# Patient Record
Sex: Female | Born: 1935 | Race: White | Hispanic: No | Smoking: Former smoker
Health system: Southern US, Community
[De-identification: ages and names within clinical notes are randomized; demographics above are authoritative.]

## PROBLEM LIST (undated history)

## (undated) DIAGNOSIS — I10 Essential (primary) hypertension: Secondary | ICD-10-CM

## (undated) DIAGNOSIS — D3500 Benign neoplasm of unspecified adrenal gland: Secondary | ICD-10-CM

## (undated) DIAGNOSIS — D649 Anemia, unspecified: Secondary | ICD-10-CM

## (undated) DIAGNOSIS — K5790 Diverticulosis of intestine, part unspecified, without perforation or abscess without bleeding: Secondary | ICD-10-CM

## (undated) DIAGNOSIS — I7121 Aneurysm of the ascending aorta, without rupture: Secondary | ICD-10-CM

## (undated) DIAGNOSIS — I639 Cerebral infarction, unspecified: Secondary | ICD-10-CM

## (undated) DIAGNOSIS — K219 Gastro-esophageal reflux disease without esophagitis: Secondary | ICD-10-CM

## (undated) DIAGNOSIS — F410 Panic disorder [episodic paroxysmal anxiety] without agoraphobia: Secondary | ICD-10-CM

## (undated) DIAGNOSIS — I480 Paroxysmal atrial fibrillation: Secondary | ICD-10-CM

## (undated) DIAGNOSIS — F329 Major depressive disorder, single episode, unspecified: Secondary | ICD-10-CM

## (undated) DIAGNOSIS — I712 Thoracic aortic aneurysm, without rupture: Secondary | ICD-10-CM

## (undated) DIAGNOSIS — I499 Cardiac arrhythmia, unspecified: Secondary | ICD-10-CM

## (undated) DIAGNOSIS — R3129 Other microscopic hematuria: Secondary | ICD-10-CM

## (undated) DIAGNOSIS — N3941 Urge incontinence: Secondary | ICD-10-CM

## (undated) DIAGNOSIS — I2510 Atherosclerotic heart disease of native coronary artery without angina pectoris: Secondary | ICD-10-CM

## (undated) DIAGNOSIS — N281 Cyst of kidney, acquired: Secondary | ICD-10-CM

## (undated) DIAGNOSIS — M1990 Unspecified osteoarthritis, unspecified site: Secondary | ICD-10-CM

## (undated) DIAGNOSIS — D126 Benign neoplasm of colon, unspecified: Secondary | ICD-10-CM

## (undated) DIAGNOSIS — M810 Age-related osteoporosis without current pathological fracture: Secondary | ICD-10-CM

## (undated) DIAGNOSIS — G8190 Hemiplegia, unspecified affecting unspecified side: Secondary | ICD-10-CM

## (undated) DIAGNOSIS — F32A Depression, unspecified: Secondary | ICD-10-CM

## (undated) DIAGNOSIS — R1310 Dysphagia, unspecified: Secondary | ICD-10-CM

## (undated) DIAGNOSIS — N952 Postmenopausal atrophic vaginitis: Secondary | ICD-10-CM

## (undated) DIAGNOSIS — N390 Urinary tract infection, site not specified: Secondary | ICD-10-CM

## (undated) DIAGNOSIS — I351 Nonrheumatic aortic (valve) insufficiency: Secondary | ICD-10-CM

## (undated) DIAGNOSIS — D510 Vitamin B12 deficiency anemia due to intrinsic factor deficiency: Secondary | ICD-10-CM

## (undated) DIAGNOSIS — D128 Benign neoplasm of rectum: Secondary | ICD-10-CM

## (undated) DIAGNOSIS — J449 Chronic obstructive pulmonary disease, unspecified: Secondary | ICD-10-CM

## (undated) HISTORY — DX: Anemia, unspecified: D64.9

## (undated) HISTORY — DX: Essential (primary) hypertension: I10

## (undated) HISTORY — DX: Nonrheumatic aortic (valve) insufficiency: I35.1

## (undated) HISTORY — DX: Paroxysmal atrial fibrillation: I48.0

## (undated) HISTORY — DX: Vitamin B12 deficiency anemia due to intrinsic factor deficiency: D51.0

## (undated) HISTORY — DX: Atherosclerotic heart disease of native coronary artery without angina pectoris: I25.10

## (undated) HISTORY — DX: Chronic obstructive pulmonary disease, unspecified: J44.9

## (undated) HISTORY — PX: AORTOILIAC BYPASS: SHX6417

## (undated) HISTORY — DX: Urge incontinence: N39.41

## (undated) HISTORY — DX: Benign neoplasm of colon, unspecified: D12.6

## (undated) HISTORY — DX: Benign neoplasm of unspecified adrenal gland: D35.00

## (undated) HISTORY — DX: Diverticulosis of intestine, part unspecified, without perforation or abscess without bleeding: K57.90

## (undated) HISTORY — PX: BLEPHAROPLASTY: SUR158

## (undated) HISTORY — DX: Aneurysm of the ascending aorta, without rupture: I71.21

## (undated) HISTORY — DX: Thoracic aortic aneurysm, without rupture: I71.2

## (undated) HISTORY — DX: Other microscopic hematuria: R31.29

## (undated) HISTORY — DX: Hemiplegia, unspecified affecting unspecified side: G81.90

## (undated) HISTORY — DX: Urinary tract infection, site not specified: N39.0

## (undated) HISTORY — DX: Cyst of kidney, acquired: N28.1

## (undated) HISTORY — DX: Age-related osteoporosis without current pathological fracture: M81.0

## (undated) HISTORY — PX: GASTROSTOMY W/ FEEDING TUBE: SUR642

## (undated) HISTORY — DX: Benign neoplasm of rectum: D12.8

## (undated) HISTORY — DX: Dysphagia, unspecified: R13.10

## (undated) HISTORY — DX: Postmenopausal atrophic vaginitis: N95.2

---

## 1970-05-05 HISTORY — PX: ABDOMINAL HYSTERECTOMY: SHX81

## 2003-05-06 HISTORY — PX: CATARACT EXTRACTION: SUR2

## 2004-03-07 ENCOUNTER — Ambulatory Visit: Payer: Self-pay | Admitting: Gastroenterology

## 2004-05-15 ENCOUNTER — Ambulatory Visit: Payer: Self-pay | Admitting: Family Medicine

## 2005-07-08 ENCOUNTER — Ambulatory Visit: Payer: Self-pay | Admitting: Family Medicine

## 2006-02-24 ENCOUNTER — Ambulatory Visit: Payer: Self-pay | Admitting: Ophthalmology

## 2006-03-03 ENCOUNTER — Ambulatory Visit: Payer: Self-pay | Admitting: Ophthalmology

## 2006-08-12 ENCOUNTER — Ambulatory Visit: Payer: Self-pay | Admitting: Family Medicine

## 2007-09-21 ENCOUNTER — Ambulatory Visit: Payer: Self-pay | Admitting: Family Medicine

## 2008-09-28 ENCOUNTER — Ambulatory Visit: Payer: Self-pay | Admitting: Family Medicine

## 2009-04-18 ENCOUNTER — Ambulatory Visit: Payer: Self-pay | Admitting: Gastroenterology

## 2009-10-01 ENCOUNTER — Ambulatory Visit: Payer: Self-pay | Admitting: Family Medicine

## 2010-06-21 ENCOUNTER — Emergency Department: Payer: Self-pay | Admitting: Emergency Medicine

## 2010-06-23 ENCOUNTER — Inpatient Hospital Stay: Payer: Self-pay | Admitting: Internal Medicine

## 2010-12-17 ENCOUNTER — Ambulatory Visit: Payer: Self-pay | Admitting: Family Medicine

## 2012-04-22 LAB — HM DEXA SCAN

## 2012-05-04 ENCOUNTER — Ambulatory Visit: Payer: Self-pay | Admitting: Family Medicine

## 2013-05-10 ENCOUNTER — Ambulatory Visit: Payer: Self-pay | Admitting: Family Medicine

## 2013-09-21 ENCOUNTER — Ambulatory Visit: Payer: Self-pay | Admitting: Urology

## 2014-05-12 ENCOUNTER — Ambulatory Visit: Payer: Self-pay | Admitting: Family Medicine

## 2014-06-05 HISTORY — PX: LEFT HEART CATH: CATH118248

## 2014-07-07 DIAGNOSIS — I712 Thoracic aortic aneurysm, without rupture: Secondary | ICD-10-CM | POA: Insufficient documentation

## 2014-07-07 DIAGNOSIS — I351 Nonrheumatic aortic (valve) insufficiency: Secondary | ICD-10-CM | POA: Insufficient documentation

## 2014-07-07 DIAGNOSIS — I7121 Aneurysm of the ascending aorta, without rupture: Secondary | ICD-10-CM | POA: Insufficient documentation

## 2014-07-07 HISTORY — PX: OTHER SURGICAL HISTORY: SHX169

## 2014-07-12 DIAGNOSIS — J9690 Respiratory failure, unspecified, unspecified whether with hypoxia or hypercapnia: Secondary | ICD-10-CM | POA: Insufficient documentation

## 2014-07-23 DIAGNOSIS — I639 Cerebral infarction, unspecified: Secondary | ICD-10-CM

## 2014-07-23 HISTORY — PX: ASCENDING AORTIC ANEURYSM REPAIR: SHX1191

## 2014-07-23 HISTORY — DX: Cerebral infarction, unspecified: I63.9

## 2014-08-02 DIAGNOSIS — F17201 Nicotine dependence, unspecified, in remission: Secondary | ICD-10-CM | POA: Insufficient documentation

## 2014-08-08 HISTORY — PX: OTHER SURGICAL HISTORY: SHX169

## 2014-08-09 ENCOUNTER — Encounter
Admit: 2014-08-09 | Disposition: A | Discharge status: Home / Self Care | Payer: Self-pay | Attending: Internal Medicine | Admitting: Internal Medicine

## 2014-08-15 LAB — PROTIME-INR
INR: 1.9
Prothrombin Time: 22 secs — ABNORMAL HIGH

## 2014-08-22 LAB — PROTIME-INR
INR: 3.6
Prothrombin Time: 36.2 secs — ABNORMAL HIGH

## 2014-08-24 LAB — PROTIME-INR
INR: 2.8
Prothrombin Time: 29.7 secs — ABNORMAL HIGH

## 2014-08-29 LAB — PROTIME-INR
INR: 2.7
Prothrombin Time: 28.8 secs — ABNORMAL HIGH

## 2014-09-04 ENCOUNTER — Encounter
Admission: RE | Admit: 2014-09-04 | Discharge: 2014-09-04 | Disposition: A | Discharge status: Home / Self Care | Payer: Commercial Managed Care - HMO | Source: Ambulatory Visit | Attending: Internal Medicine | Admitting: Internal Medicine

## 2014-09-04 DIAGNOSIS — E876 Hypokalemia: Secondary | ICD-10-CM | POA: Insufficient documentation

## 2014-09-04 DIAGNOSIS — R609 Edema, unspecified: Secondary | ICD-10-CM | POA: Insufficient documentation

## 2014-09-04 DIAGNOSIS — I509 Heart failure, unspecified: Secondary | ICD-10-CM | POA: Insufficient documentation

## 2014-09-04 DIAGNOSIS — I4891 Unspecified atrial fibrillation: Secondary | ICD-10-CM | POA: Insufficient documentation

## 2014-09-05 DIAGNOSIS — R609 Edema, unspecified: Secondary | ICD-10-CM | POA: Diagnosis not present

## 2014-09-05 DIAGNOSIS — I509 Heart failure, unspecified: Secondary | ICD-10-CM | POA: Diagnosis not present

## 2014-09-05 DIAGNOSIS — I4891 Unspecified atrial fibrillation: Secondary | ICD-10-CM | POA: Diagnosis not present

## 2014-09-05 DIAGNOSIS — E876 Hypokalemia: Secondary | ICD-10-CM | POA: Diagnosis not present

## 2014-09-05 LAB — PROTIME-INR
INR: 2.57
PROTHROMBIN TIME: 27.7 s — AB (ref 11.4–15.0)

## 2014-09-13 ENCOUNTER — Other Ambulatory Visit: Payer: Self-pay | Admitting: Internal Medicine

## 2014-09-13 DIAGNOSIS — R1310 Dysphagia, unspecified: Secondary | ICD-10-CM

## 2014-09-14 ENCOUNTER — Ambulatory Visit
Admission: RE | Admit: 2014-09-14 | Discharge: 2014-09-14 | Disposition: A | Discharge status: Home / Self Care | Payer: Commercial Managed Care - HMO | Source: Ambulatory Visit | Attending: Internal Medicine | Admitting: Internal Medicine

## 2014-09-14 ENCOUNTER — Ambulatory Visit: Payer: Commercial Managed Care - HMO

## 2014-09-14 DIAGNOSIS — R1312 Dysphagia, oropharyngeal phase: Secondary | ICD-10-CM

## 2014-09-14 DIAGNOSIS — R1310 Dysphagia, unspecified: Secondary | ICD-10-CM

## 2014-09-14 NOTE — Therapy (Signed)
[place], [place], [address] Phone: [phone]   Fax:     Modified Barium Swallow  Patient Details  Name: Ana Schultz MRN: [id number] Date of Birth: 30-Oct-1925 Referring Provider:  Kirk Ruths, MD  Encounter Date: 09/14/2014      End of Session - 09/14/14 1446    Visit Number 1   Number of Visits 1   Date for SLP Re-Evaluation 09/14/14   SLP Start Time 50   SLP Stop Time  1400   SLP Time Calculation (min) 60 min   Activity Tolerance Patient tolerated treatment well     Subjective: Patient behavior: (alertness, ability to follow instructions, etc.): Pt was awake, alert and verbally conversive. Mild+ Dysarthria of speech noted sec. To Left oral weakness. Pt followed instructions appropriately.    Objective:  Radiological Procedure: A videoflouroscopic evaluation of oral-preparatory, reflex initiation, and pharyngeal phases of the swallow was performed; as well as a screening of the upper esophageal phase.   I. POSTURE: Upright  II. VIEW: Lateral  III. COMPENSATORY STRATEGIES: Small sips; Multiple swallows  IV. BOLUSES ADMINISTERED: Thin liquids by Cup - 7; Nectar liquids by Tsp - 3; Nectar liquids by Cup - 3; Puree by 1/2 Tsp - 4; Mech Soft(crumbled graham cracker in puree) - 1.  V. RESULTS OF EVALUATION: A. ORAL PREPARATORY PHASE: (The lips, tongue, and velum are observed for strength and coordination)       **Overall Severity Rating: grossly WFL w/ all consistencies tested; slight oral residue in Left buccal area which cleared w/ lingual sweep and f/u swallow. Full control of bolus material held orally prior to A-P transfer; appropriate adequate anterior munch of boluses of increased texture.   B. SWALLOW INITIATION/REFLEX: (The reflex is normal if "triggered" by the time the bolus reached the base of the tongue)  **Overall Severity Rating: MILD delay in pharyngeal swallow  initiation w/ trials of thin liquids - trigger noted while spilling from the Valleculae to the Pyrifiorm Sinuses; adequate timing of the pharyngeal swallow w/ trials of Nectar liquids, puree, and mech soft at the level of the Valleculae.   C. PHARYNGEAL PHASE: (Pharyngeal function is normal if the bolus shows rapid, smooth, and continuous transit through the pharynx and there is no pharyngeal residue after the swallow)  **Overall Severity Rating: grossly WFL - inconsistent slight-min. Pharyngeal residue w/ trials which appeared to clear w/ an, independent f/u swallow and w/ alternating foods/liquids. No buildup of bolus residue in the pharynx during oral intake that would increase risk for laryngeal penetration of residue post swallow.  D. LARYNGEAL PENETRATION: (Material entering into the laryngeal inlet/vestibule but not aspirated): Noted x1 w/ small, cup sip trial of thin liquid which then spilled to become aspiration.  E. ASPIRATION: see above. Pt responded w/ a reflexive, effortful cough which appeared to clear the aspirated amount.  F. ESOPHAGEAL PHASE: (Screening of the upper esophagus): no deficits observed in the upper, cervical esophagus.  ASSESSMENT: Pt appeared to present w/ mild Oropharyngeal phase dysphagia during this study today. Pt presents w/ min. Increased risk for aspiration, however, demonstrates a reflexive cough in response to laryngeal penetration/aspiration occuring x1 trial w/ thin liquids. Pt appeared to adequately manage thin liquids and purees(w/ a trial of soft solid x1) following aspiration precautions and oral clearing strategy. Pt would benefit from continued skilled ST therapy to monitor implementation of an oral diet and trials to upgrade to a mech soft diet as  tolerates(w/ dentures). Rec. Meds in Puree - crushed initially but upgrade to whole in puree as tolerates. Rec. Strict monitoring of pt's respiratory status; GI status as PEG TFs are reduced - Dietician f/u  important to address pt's nutrition/hydration needs; supplements.  PLAN/RECOMMENDATIONS:  A. Diet: Pureed-Mech Soft (when able to wear dentures); Thin liquids - NO STRAWS  B. Swallowing Precautions: small, single sips; reduce distractions.   C. Recommended consultation to Dietician; GI f/u when PEG is no longer needed  D. Therapy recommendations: monitoring of pt's safety w/ diet; pt/family education on precautions  E. Results and recommendations were given to pt/family; consulting SLP; MD/NSG  No past medical history on file.  No past surgical history on file.  There were no vitals filed for this visit.  Visit Diagnosis: Dysphagia, oropharyngeal phase  Dysphagia - Plan: DG OP Swallowing Func-Medicare/Speech Path, DG OP Swallowing Func-Medicare/Speech Path                               G-Codes - 10-13-2014 1453    Functional Assessment Tool Used MBSS   Functional Limitations Swallowing   Swallow Current Status (G1829) At least 1 percent but less than 20 percent impaired, limited or restricted   Swallow Goal Status (H3716) At least 1 percent but less than 20 percent impaired, limited or restricted   Swallow Discharge Status [phone]) At least 1 percent but less than 20 percent impaired, limited or restricted          Problem List There are no active problems to display for this patient.   Watson,Katherine Oct 13, 2014, 4:29 PM  [facility] [place], [place], [address] Phone: [phone]   Fax:

## 2014-09-15 DIAGNOSIS — I4891 Unspecified atrial fibrillation: Secondary | ICD-10-CM | POA: Diagnosis not present

## 2014-09-15 LAB — PROTIME-INR
INR: 1.41
Prothrombin Time: 17.5 seconds — ABNORMAL HIGH (ref 11.4–15.0)

## 2014-09-19 DIAGNOSIS — I4891 Unspecified atrial fibrillation: Secondary | ICD-10-CM | POA: Diagnosis not present

## 2014-09-19 LAB — COMPREHENSIVE METABOLIC PANEL
ALBUMIN: 3.4 g/dL — AB (ref 3.5–5.0)
ALK PHOS: 99 U/L (ref 38–126)
ALT: 45 U/L (ref 14–54)
AST: 39 U/L (ref 15–41)
Anion gap: 10 (ref 5–15)
BUN: 33 mg/dL — AB (ref 6–20)
CO2: 37 mmol/L — ABNORMAL HIGH (ref 22–32)
CREATININE: 0.88 mg/dL (ref 0.44–1.00)
Calcium: 8.8 mg/dL — ABNORMAL LOW (ref 8.9–10.3)
Chloride: 91 mmol/L — ABNORMAL LOW (ref 101–111)
GFR calc non Af Amer: 57 mL/min — ABNORMAL LOW (ref >60)
GLUCOSE: 76 mg/dL (ref 65–99)
Potassium: 3.6 mmol/L (ref 3.5–5.1)
Sodium: 138 mmol/L (ref 135–145)
TOTAL PROTEIN: 5.9 g/dL — AB (ref 6.5–8.1)
Total Bilirubin: 0.5 mg/dL (ref 0.3–1.2)

## 2014-09-19 LAB — CBC WITH DIFFERENTIAL/PLATELET
Basophils Absolute: 0.1 10*3/uL (ref 0–0.1)
Basophils Relative: 2 %
EOS ABS: 0.4 10*3/uL (ref 0–0.7)
EOS PCT: 7 %
HCT: 31.6 % — ABNORMAL LOW (ref 35.0–47.0)
HEMOGLOBIN: 10.2 g/dL — AB (ref 12.0–16.0)
LYMPHS ABS: 1 10*3/uL (ref 1.0–3.6)
LYMPHS PCT: 16 %
MCH: 29.4 pg (ref 26.0–34.0)
MCHC: 32.3 g/dL (ref 32.0–36.0)
MCV: 90.9 fL (ref 80.0–100.0)
Monocytes Absolute: 0.6 10*3/uL (ref 0.2–0.9)
Monocytes Relative: 10 %
Neutro Abs: 4 10*3/uL (ref 1.4–6.5)
Neutrophils Relative %: 65 %
PLATELETS: 206 10*3/uL (ref 150–440)
RBC: 3.47 MIL/uL — ABNORMAL LOW (ref 3.80–5.20)
RDW: 15.8 % — ABNORMAL HIGH (ref 11.5–14.5)
WBC: 6.1 10*3/uL (ref 3.6–11.0)

## 2014-09-19 LAB — PROTIME-INR
INR: 1.56
Prothrombin Time: 18.9 seconds — ABNORMAL HIGH (ref 11.4–15.0)

## 2014-09-26 ENCOUNTER — Encounter
Admission: RE | Admit: 2014-09-26 | Discharge: 2014-09-26 | Disposition: A | Discharge status: Home / Self Care | Payer: Commercial Managed Care - HMO | Source: Ambulatory Visit | Attending: Internal Medicine | Admitting: Internal Medicine

## 2014-09-26 DIAGNOSIS — I714 Abdominal aortic aneurysm, without rupture: Secondary | ICD-10-CM | POA: Diagnosis present

## 2014-09-26 DIAGNOSIS — R609 Edema, unspecified: Secondary | ICD-10-CM | POA: Insufficient documentation

## 2014-09-26 DIAGNOSIS — E876 Hypokalemia: Secondary | ICD-10-CM | POA: Insufficient documentation

## 2014-09-26 DIAGNOSIS — I4891 Unspecified atrial fibrillation: Secondary | ICD-10-CM | POA: Diagnosis present

## 2014-09-26 DIAGNOSIS — I509 Heart failure, unspecified: Secondary | ICD-10-CM | POA: Insufficient documentation

## 2014-09-26 LAB — PROTIME-INR
INR: 3.19
Prothrombin Time: 32.7 seconds — ABNORMAL HIGH (ref 11.4–15.0)

## 2014-09-28 ENCOUNTER — Encounter
Admission: RE | Admit: 2014-09-28 | Discharge: 2014-09-28 | Disposition: A | Discharge status: Home / Self Care | Payer: Commercial Managed Care - HMO | Source: Ambulatory Visit | Attending: Internal Medicine | Admitting: Internal Medicine

## 2014-09-28 DIAGNOSIS — I714 Abdominal aortic aneurysm, without rupture: Secondary | ICD-10-CM | POA: Diagnosis not present

## 2014-09-28 LAB — CBC WITH DIFFERENTIAL/PLATELET
BASOS ABS: 0.1 10*3/uL (ref 0–0.1)
BASOS PCT: 2 %
EOS PCT: 9 %
Eosinophils Absolute: 0.4 10*3/uL (ref 0–0.7)
HEMATOCRIT: 35.1 % (ref 35.0–47.0)
Hemoglobin: 11.5 g/dL — ABNORMAL LOW (ref 12.0–16.0)
LYMPHS PCT: 32 %
Lymphs Abs: 1.4 10*3/uL (ref 1.0–3.6)
MCH: 29.1 pg (ref 26.0–34.0)
MCHC: 32.7 g/dL (ref 32.0–36.0)
MCV: 89 fL (ref 80.0–100.0)
Monocytes Absolute: 0.4 10*3/uL (ref 0.2–0.9)
Monocytes Relative: 8 %
NEUTROS PCT: 49 %
Neutro Abs: 2.2 10*3/uL (ref 1.4–6.5)
PLATELETS: 176 10*3/uL (ref 150–440)
RBC: 3.95 MIL/uL (ref 3.80–5.20)
RDW: 15.5 % — ABNORMAL HIGH (ref 11.5–14.5)
WBC: 4.5 10*3/uL (ref 3.6–11.0)

## 2014-09-28 LAB — COMPREHENSIVE METABOLIC PANEL
ALK PHOS: 81 U/L (ref 38–126)
ALT: 24 U/L (ref 14–54)
AST: 26 U/L (ref 15–41)
Albumin: 3.5 g/dL (ref 3.5–5.0)
Anion gap: 9 (ref 5–15)
BUN: 17 mg/dL (ref 6–20)
CHLORIDE: 95 mmol/L — AB (ref 101–111)
CO2: 35 mmol/L — AB (ref 22–32)
Calcium: 9 mg/dL (ref 8.9–10.3)
Creatinine, Ser: 0.91 mg/dL (ref 0.44–1.00)
GFR calc Af Amer: >60 mL/min (ref >60)
GFR calc non Af Amer: 55 mL/min — ABNORMAL LOW (ref >60)
Glucose, Bld: 94 mg/dL (ref 65–99)
Potassium: 3 mmol/L — ABNORMAL LOW (ref 3.5–5.1)
Sodium: 139 mmol/L (ref 135–145)
Total Bilirubin: 0.4 mg/dL (ref 0.3–1.2)
Total Protein: 6.4 g/dL — ABNORMAL LOW (ref 6.5–8.1)

## 2014-09-28 LAB — PROTIME-INR
INR: 2.51
Prothrombin Time: 27.2 seconds — ABNORMAL HIGH (ref 11.4–15.0)

## 2014-09-29 DIAGNOSIS — I4891 Unspecified atrial fibrillation: Secondary | ICD-10-CM | POA: Diagnosis not present

## 2014-09-29 LAB — POTASSIUM: Potassium: 3.3 mmol/L — ABNORMAL LOW (ref 3.5–5.1)

## 2014-10-04 ENCOUNTER — Encounter
Admission: RE | Admit: 2014-10-04 | Discharge: 2014-10-04 | Disposition: A | Discharge status: Home / Self Care | Payer: Commercial Managed Care - HMO | Source: Ambulatory Visit | Attending: Internal Medicine | Admitting: Internal Medicine

## 2014-10-04 DIAGNOSIS — I4891 Unspecified atrial fibrillation: Secondary | ICD-10-CM | POA: Insufficient documentation

## 2014-10-05 DIAGNOSIS — I4891 Unspecified atrial fibrillation: Secondary | ICD-10-CM | POA: Diagnosis not present

## 2014-10-05 LAB — COMPREHENSIVE METABOLIC PANEL
ALT: 21 U/L (ref 14–54)
AST: 25 U/L (ref 15–41)
Albumin: 3.8 g/dL (ref 3.5–5.0)
Alkaline Phosphatase: 96 U/L (ref 38–126)
Anion gap: 9 (ref 5–15)
BILIRUBIN TOTAL: 0.4 mg/dL (ref 0.3–1.2)
BUN: 21 mg/dL — AB (ref 6–20)
CO2: 34 mmol/L — AB (ref 22–32)
CREATININE: 0.87 mg/dL (ref 0.44–1.00)
Calcium: 9.6 mg/dL (ref 8.9–10.3)
Chloride: 98 mmol/L — ABNORMAL LOW (ref 101–111)
GFR calc Af Amer: >60 mL/min (ref >60)
GFR calc non Af Amer: 58 mL/min — ABNORMAL LOW (ref >60)
GLUCOSE: 119 mg/dL — AB (ref 65–99)
Potassium: 3.5 mmol/L (ref 3.5–5.1)
Sodium: 141 mmol/L (ref 135–145)
Total Protein: 6.9 g/dL (ref 6.5–8.1)

## 2014-10-05 LAB — PROTIME-INR
INR: 2.03
Prothrombin Time: 23.1 seconds — ABNORMAL HIGH (ref 11.4–15.0)

## 2014-10-05 LAB — CBC WITH DIFFERENTIAL/PLATELET
BASOS ABS: 0.1 10*3/uL (ref 0–0.1)
BASOS PCT: 2 %
Eosinophils Absolute: 0.5 10*3/uL (ref 0–0.7)
Eosinophils Relative: 10 %
HCT: 36.2 % (ref 35.0–47.0)
Hemoglobin: 11.8 g/dL — ABNORMAL LOW (ref 12.0–16.0)
LYMPHS PCT: 29 %
Lymphs Abs: 1.4 10*3/uL (ref 1.0–3.6)
MCH: 28.7 pg (ref 26.0–34.0)
MCHC: 32.4 g/dL (ref 32.0–36.0)
MCV: 88.6 fL (ref 80.0–100.0)
Monocytes Absolute: 0.3 10*3/uL (ref 0.2–0.9)
Monocytes Relative: 6 %
NEUTROS PCT: 53 %
Neutro Abs: 2.6 10*3/uL (ref 1.4–6.5)
PLATELETS: 162 10*3/uL (ref 150–440)
RBC: 4.09 MIL/uL (ref 3.80–5.20)
RDW: 15.7 % — ABNORMAL HIGH (ref 11.5–14.5)
WBC: 4.9 10*3/uL (ref 3.6–11.0)

## 2014-10-09 ENCOUNTER — Other Ambulatory Visit: Payer: Self-pay

## 2014-10-09 DIAGNOSIS — I10 Essential (primary) hypertension: Secondary | ICD-10-CM | POA: Insufficient documentation

## 2014-10-09 DIAGNOSIS — D510 Vitamin B12 deficiency anemia due to intrinsic factor deficiency: Secondary | ICD-10-CM | POA: Insufficient documentation

## 2014-10-09 DIAGNOSIS — I729 Aneurysm of unspecified site: Secondary | ICD-10-CM | POA: Insufficient documentation

## 2014-10-09 DIAGNOSIS — J45909 Unspecified asthma, uncomplicated: Secondary | ICD-10-CM | POA: Insufficient documentation

## 2014-10-09 DIAGNOSIS — M810 Age-related osteoporosis without current pathological fracture: Secondary | ICD-10-CM | POA: Insufficient documentation

## 2014-10-10 ENCOUNTER — Ambulatory Visit (INDEPENDENT_AMBULATORY_CARE_PROVIDER_SITE_OTHER): Payer: Commercial Managed Care - HMO | Admitting: Gastroenterology

## 2014-10-10 ENCOUNTER — Encounter: Payer: Self-pay | Admitting: Gastroenterology

## 2014-10-10 ENCOUNTER — Ambulatory Visit: Payer: Self-pay | Admitting: Gastroenterology

## 2014-10-10 VITALS — BP 144/67 | HR 67 | Temp 98.4°F | Ht 61.0 in | Wt 130.0 lb

## 2014-10-10 DIAGNOSIS — R1314 Dysphagia, pharyngoesophageal phase: Secondary | ICD-10-CM | POA: Diagnosis not present

## 2014-10-10 NOTE — Assessment & Plan Note (Signed)
The patient is an 81 year old woman who had a PEG tube placed in [place] who now comes to have the PEG tube removed. The patient states she is no longer using it. The patient had the PEG tube removed without any sign of bleeding. The patient will keep the area covered and it should close up over the next 24 hours.

## 2014-10-10 NOTE — Progress Notes (Signed)
Gastroenterology Consultation  Referring Provider:     Juluis Pitch, MD Primary Care Physician:  Juluis Pitch, MD Primary Gastroenterologist:  Dr. Allen Norris     Reason for Consultation:     PEG tube removal        HPI:   Ana Schultz is a 81 y.o. y/o female referred for consultation & management of removing the PEG tube by Dr. Lovie Macadamia, DAVID, MD.  This patient today for removal of her PEG tube. The patient reports that she had a put in at [facility]. The patient also reports that she is no longer using it and has no need for it and would like for her to be taken out.  Past Medical History  Diagnosis Date  . Dysphagia   . Paroxysmal atrial fibrillation   . Nonrheumatic aortic valve insufficiency   . Hemiplegia and hemiparesis     following cerebral infarction affecting left non-dominant side    History reviewed. No pertinent past surgical history.  Prior to Admission medications   Medication Sig Start Date End Date Taking? Authorizing Provider  acetaminophen (TYLENOL) 325 MG tablet Take 650 mg by mouth every 4 (four) hours as needed.   Yes Historical Provider, MD  Amino Acids-Protein Hydrolys (FEEDING SUPPLEMENT, PRO-STAT SUGAR FREE 64,) LIQD Take 30 mLs by mouth daily.   Yes Historical Provider, MD  Artificial Tear Ointment (ARTIFICIAL TEARS) ointment as needed.   Yes Historical Provider, MD  Artificial Tear Ointment (REFRESH P.M. OP) Apply to eye 2 (two) times daily.   Yes Historical Provider, MD  aspirin EC 81 MG tablet Take 81 mg by mouth.   Yes Historical Provider, MD  atorvastatin (LIPITOR) 40 MG tablet Take 40 mg by mouth. 08/10/14 08/10/15 Yes Historical Provider, MD  bisacodyl (DULCOLAX) 10 MG suppository Place 10 mg rectally as needed for moderate constipation.   Yes Historical Provider, MD  calcium carbonate (OS-CAL - DOSED IN MG OF ELEMENTAL CALCIUM) 1250 (500 CA) MG tablet Take by mouth.   Yes Historical Provider, MD  camphor-menthol Timoteo Ace) lotion Apply 1 application  topically as needed for itching.   Yes Historical Provider, MD  Carboxymethylcellulose Sodium (REFRESH TEARS OP) Apply to eye daily.   Yes Historical Provider, MD  cetirizine (ZYRTEC) 5 MG tablet Take 5 mg by mouth at bedtime as needed for allergies.   Yes Historical Provider, MD  clonazePAM (KLONOPIN) 0.5 MG tablet Take 0.5 mg by mouth every 12 (twelve) hours.   Yes Historical Provider, MD  cyanocobalamin (,VITAMIN B-12,) 1000 MCG/ML injection Inject into the muscle. 09/13/13  Yes Historical Provider, MD  diltiazem (CARDIZEM) 60 MG tablet Take 60 mg by mouth. 08/10/14 08/10/15 Yes Historical Provider, MD  diphenhydramine-acetaminophen (TYLENOL PM) 25-500 MG TABS Take by mouth.   Yes Historical Provider, MD  docusate sodium (COLACE) 100 MG capsule Take 100 mg by mouth. 08/10/14  Yes Historical Provider, MD  fluticasone (FLOVENT HFA) 110 MCG/ACT inhaler Inhale into the lungs. 08/10/14 08/10/15 Yes Historical Provider, MD  Fluticasone-Salmeterol (ADVAIR) 250-50 MCG/DOSE AEPB Inhale into the lungs.   Yes Historical Provider, MD  furosemide (LASIX) 40 MG tablet Take 40 mg by mouth. 08/10/14 08/10/15 Yes Historical Provider, MD  Incontinence Supply Disposable ([employer] BLADDER CONTROL PAD) MISC  05/24/14  Yes Historical Provider, MD  levofloxacin (LEVAQUIN) 750 MG tablet Take 750 mg by mouth daily.   Yes Historical Provider, MD  lidocaine (LIDODERM) 5 % Place 1 patch onto the skin daily. Remove & Discard patch within 12 hours or as  directed by MD   Yes Historical Provider, MD  LORazepam (ATIVAN) 0.5 MG tablet Take 0.5 mg by mouth every 4 (four) hours as needed for anxiety.   Yes Historical Provider, MD  Magnesium Hydroxide (MILK OF MAGNESIA PO) Take by mouth daily.   Yes Historical Provider, MD  Melatonin 3 MG TABS 6 mg. 08/10/14  Yes Historical Provider, MD  polyethylene glycol (MIRALAX / GLYCOLAX) packet Take 17 g by mouth at bedtime as needed.   Yes Historical Provider, MD  potassium chloride SA (K-DUR,KLOR-CON) 20 MEQ  tablet Take 40 mEq by mouth daily.   Yes Historical Provider, MD  PSYLLIUM PO Take 3.4 g by mouth daily.   Yes Historical Provider, MD  sennosides-docusate sodium (SENOKOT-S) 8.6-50 MG tablet Take 1 tablet by mouth 2 (two) times daily.   Yes Historical Provider, MD  tiotropium (SPIRIVA) 18 MCG inhalation capsule Place 18 mcg into inhaler and inhale daily.   Yes Historical Provider, MD  traZODone (DESYREL) 50 MG tablet Take 50 mg by mouth at bedtime.   Yes Historical Provider, MD  triamcinolone cream (KENALOG) 0.1 % Apply 1 application topically 2 (two) times daily.   Yes Historical Provider, MD  Trimethoprim HCl 50 MG/5ML SOLN 100 mg. 08/10/14  Yes Historical Provider, MD  warfarin (COUMADIN) 4 MG tablet Take 4 mg by mouth. 08/10/14 08/10/15 Yes Historical Provider, MD    History reviewed. No pertinent family history.   History  Substance Use Topics  . Smoking status: Former Research scientist (life sciences)  . Smokeless tobacco: Never Used  . Alcohol Use: No    Allergies as of 10/10/2014 - Review Complete 10/10/2014  Allergen Reaction Noted  . Iodinated diagnostic agents Itching and Swelling 10/09/2014  . Ciprofloxacin Other (See Comments) 10/09/2014  . Nitrofurantoin Diarrhea 10/09/2014  . Solifenacin Other (See Comments) 10/09/2014  . Metronidazole Itching and Rash 10/09/2014    Review of Systems:    All systems reviewed and negative except where noted in HPI.   Physical Exam:  BP 144/67 mmHg  Pulse 67  Temp(Src) 98.4 F (36.9 C) (Oral)  Ht 5\' 1"  (1.549 m)  Wt 130 lb (58.968 kg)  BMI 24.58 kg/m2 No LMP recorded. Psych:  Alert and cooperative. Normal mood and affect. General:   Alert,  Well-developed, well-nourished, pleasant and cooperative in NAD Head:  Normocephalic and atraumatic. Eyes:  Sclera clear, no icterus.   Conjunctiva pink. Ears:  Normal auditory acuity. Nose:  No deformity, discharge, or lesions. Mouth:  No deformity or lesions,oropharynx pink & moist. Neck:  Supple; no masses or  thyromegaly. Abdomen:  Normal bowel sounds.  No bruits.  Soft, non-tender and non-distended without masses, hepatosplenomegaly or hernias noted.  No guarding or rebound tenderness.  Negative Carnett sign. PEG tube noted in the left middle abdomen  Rectal:  Deferred.  Msk:  Symmetrical without gross deformities.  Good, equal movement & strength bilaterally. Pulses:  Normal pulses noted. Extremities:  No clubbing or edema.  No cyanosis. Neurologic:  Alert and oriented x3;  grossly normal neurologically. Skin:  Intact without significant lesions or rashes.  No jaundice.   Imaging Studies: Dg Op Swallowing Func-medicare/speech Path  09/14/2014   CLINICAL DATA:  Dysphagia  EXAM: MODIFIED BARIUM SWALLOW  TECHNIQUE: Different consistencies of barium were administered orally to the patient by the Speech Pathologist. Imaging of the pharynx was performed in the lateral projection.  FLUOROSCOPY TIME:  If the device does not provide the exposure index:  Fluoroscopy Time:  1 minutes 30 seconds  Number  of Acquired Images:  0  COMPARISON:  None.  FINDINGS: Thin liquid- 1 episode of mild tracheal aspiration with subsequent coughing. Otherwise within normal limits  Nectar thick liquid- within normal limits  Honey- within normal limits  Pure- within normal limits  Pure with cracker- within normal limits  IMPRESSION: Modified barium swallow as described above.  Please refer to the Speech Pathologists report for complete details and recommendations.   Electronically Signed   By: Kathreen Devoid   On: 09/14/2014 15:39

## 2014-10-12 DIAGNOSIS — I4891 Unspecified atrial fibrillation: Secondary | ICD-10-CM | POA: Diagnosis not present

## 2014-10-12 LAB — PROTIME-INR
INR: 2.38
Prothrombin Time: 26.1 seconds — ABNORMAL HIGH (ref 11.4–15.0)

## 2014-10-12 LAB — CBC WITH DIFFERENTIAL/PLATELET
Basophils Absolute: 0.1 10*3/uL (ref 0–0.1)
Basophils Relative: 1 %
EOS PCT: 9 %
Eosinophils Absolute: 0.4 10*3/uL (ref 0–0.7)
HEMATOCRIT: 35 % (ref 35.0–47.0)
Hemoglobin: 11.4 g/dL — ABNORMAL LOW (ref 12.0–16.0)
Lymphocytes Relative: 25 %
Lymphs Abs: 1.3 10*3/uL (ref 1.0–3.6)
MCH: 28.8 pg (ref 26.0–34.0)
MCHC: 32.7 g/dL (ref 32.0–36.0)
MCV: 88 fL (ref 80.0–100.0)
Monocytes Absolute: 0.4 10*3/uL (ref 0.2–0.9)
Monocytes Relative: 9 %
NEUTROS ABS: 2.9 10*3/uL (ref 1.4–6.5)
NEUTROS PCT: 56 %
Platelets: 192 10*3/uL (ref 150–440)
RBC: 3.97 MIL/uL (ref 3.80–5.20)
RDW: 16 % — ABNORMAL HIGH (ref 11.5–14.5)
WBC: 5.2 10*3/uL (ref 3.6–11.0)

## 2014-10-12 LAB — COMPREHENSIVE METABOLIC PANEL
ALK PHOS: 103 U/L (ref 38–126)
ALT: 26 U/L (ref 14–54)
AST: 27 U/L (ref 15–41)
Albumin: 3.7 g/dL (ref 3.5–5.0)
Anion gap: 9 (ref 5–15)
BUN: 16 mg/dL (ref 6–20)
CO2: 32 mmol/L (ref 22–32)
Calcium: 9.4 mg/dL (ref 8.9–10.3)
Chloride: 100 mmol/L — ABNORMAL LOW (ref 101–111)
Creatinine, Ser: 0.79 mg/dL (ref 0.44–1.00)
GFR calc Af Amer: >60 mL/min (ref >60)
Glucose, Bld: 81 mg/dL (ref 65–99)
POTASSIUM: 3.8 mmol/L (ref 3.5–5.1)
SODIUM: 141 mmol/L (ref 135–145)
Total Bilirubin: 0.7 mg/dL (ref 0.3–1.2)
Total Protein: 6.4 g/dL — ABNORMAL LOW (ref 6.5–8.1)

## 2014-10-19 DIAGNOSIS — I4891 Unspecified atrial fibrillation: Secondary | ICD-10-CM | POA: Diagnosis not present

## 2014-10-19 LAB — CBC WITH DIFFERENTIAL/PLATELET
Basophils Absolute: 0.1 10*3/uL (ref 0–0.1)
Basophils Relative: 1 %
Eosinophils Absolute: 0.4 10*3/uL (ref 0–0.7)
Eosinophils Relative: 8 %
HCT: 32.1 % — ABNORMAL LOW (ref 35.0–47.0)
Hemoglobin: 10.4 g/dL — ABNORMAL LOW (ref 12.0–16.0)
Lymphocytes Relative: 22 %
Lymphs Abs: 1.1 10*3/uL (ref 1.0–3.6)
MCH: 28 pg (ref 26.0–34.0)
MCHC: 32.2 g/dL (ref 32.0–36.0)
MCV: 86.9 fL (ref 80.0–100.0)
Monocytes Absolute: 0.5 10*3/uL (ref 0.2–0.9)
Monocytes Relative: 11 %
NEUTROS PCT: 58 %
Neutro Abs: 2.7 10*3/uL (ref 1.4–6.5)
PLATELETS: 174 10*3/uL (ref 150–440)
RBC: 3.7 MIL/uL — ABNORMAL LOW (ref 3.80–5.20)
RDW: 15.7 % — ABNORMAL HIGH (ref 11.5–14.5)
WBC: 4.7 10*3/uL (ref 3.6–11.0)

## 2014-10-19 LAB — COMPREHENSIVE METABOLIC PANEL
ALBUMIN: 3.4 g/dL — AB (ref 3.5–5.0)
ALT: 29 U/L (ref 14–54)
AST: 22 U/L (ref 15–41)
Alkaline Phosphatase: 126 U/L (ref 38–126)
Anion gap: 3 — ABNORMAL LOW (ref 5–15)
BUN: 18 mg/dL (ref 6–20)
CALCIUM: 9 mg/dL (ref 8.9–10.3)
CO2: 34 mmol/L — AB (ref 22–32)
CREATININE: 0.82 mg/dL (ref 0.44–1.00)
Chloride: 103 mmol/L (ref 101–111)
GFR calc Af Amer: >60 mL/min (ref >60)
GFR calc non Af Amer: >60 mL/min (ref >60)
Glucose, Bld: 89 mg/dL (ref 65–99)
POTASSIUM: 3.4 mmol/L — AB (ref 3.5–5.1)
Sodium: 140 mmol/L (ref 135–145)
TOTAL PROTEIN: 6 g/dL — AB (ref 6.5–8.1)
Total Bilirubin: 0.5 mg/dL (ref 0.3–1.2)

## 2014-10-19 LAB — PROTIME-INR
INR: 1.11
PROTHROMBIN TIME: 14.5 s (ref 11.4–15.0)

## 2014-10-24 DIAGNOSIS — I4891 Unspecified atrial fibrillation: Secondary | ICD-10-CM | POA: Diagnosis not present

## 2014-10-24 LAB — PROTIME-INR
INR: 1.47
PROTHROMBIN TIME: 18 s — AB (ref 11.4–15.0)

## 2014-10-27 DIAGNOSIS — I4891 Unspecified atrial fibrillation: Secondary | ICD-10-CM | POA: Diagnosis not present

## 2014-10-27 LAB — PROTIME-INR
INR: 1.91
PROTHROMBIN TIME: 22 s — AB (ref 11.4–15.0)

## 2014-10-31 DIAGNOSIS — I4891 Unspecified atrial fibrillation: Secondary | ICD-10-CM | POA: Diagnosis not present

## 2014-10-31 LAB — PROTIME-INR
INR: 2.31
Prothrombin Time: 25.5 seconds — ABNORMAL HIGH (ref 11.4–15.0)

## 2014-11-03 ENCOUNTER — Encounter
Admission: RE | Admit: 2014-11-03 | Discharge: 2014-11-03 | Disposition: A | Discharge status: Home / Self Care | Payer: Commercial Managed Care - HMO | Source: Ambulatory Visit | Attending: Internal Medicine | Admitting: Internal Medicine

## 2014-11-03 DIAGNOSIS — I509 Heart failure, unspecified: Secondary | ICD-10-CM | POA: Insufficient documentation

## 2014-11-03 DIAGNOSIS — E876 Hypokalemia: Secondary | ICD-10-CM | POA: Insufficient documentation

## 2014-11-03 DIAGNOSIS — I4891 Unspecified atrial fibrillation: Secondary | ICD-10-CM | POA: Insufficient documentation

## 2014-11-03 DIAGNOSIS — R609 Edema, unspecified: Secondary | ICD-10-CM | POA: Insufficient documentation

## 2014-11-05 ENCOUNTER — Emergency Department: Payer: Commercial Managed Care - HMO

## 2014-11-05 ENCOUNTER — Other Ambulatory Visit: Payer: Self-pay

## 2014-11-05 ENCOUNTER — Emergency Department
Admission: EM | Admit: 2014-11-05 | Discharge: 2014-11-05 | Disposition: A | Discharge status: Home / Self Care | Payer: Commercial Managed Care - HMO | Attending: Emergency Medicine | Admitting: Emergency Medicine

## 2014-11-05 DIAGNOSIS — Z791 Long term (current) use of non-steroidal anti-inflammatories (NSAID): Secondary | ICD-10-CM | POA: Diagnosis not present

## 2014-11-05 DIAGNOSIS — Z7901 Long term (current) use of anticoagulants: Secondary | ICD-10-CM | POA: Diagnosis not present

## 2014-11-05 DIAGNOSIS — Z792 Long term (current) use of antibiotics: Secondary | ICD-10-CM | POA: Diagnosis not present

## 2014-11-05 DIAGNOSIS — R0602 Shortness of breath: Secondary | ICD-10-CM | POA: Diagnosis not present

## 2014-11-05 DIAGNOSIS — Z87891 Personal history of nicotine dependence: Secondary | ICD-10-CM | POA: Insufficient documentation

## 2014-11-05 DIAGNOSIS — Z79899 Other long term (current) drug therapy: Secondary | ICD-10-CM | POA: Diagnosis not present

## 2014-11-05 DIAGNOSIS — R0789 Other chest pain: Secondary | ICD-10-CM | POA: Diagnosis not present

## 2014-11-05 DIAGNOSIS — Z7982 Long term (current) use of aspirin: Secondary | ICD-10-CM | POA: Insufficient documentation

## 2014-11-05 DIAGNOSIS — R079 Chest pain, unspecified: Secondary | ICD-10-CM

## 2014-11-05 HISTORY — DX: Cerebral infarction, unspecified: I63.9

## 2014-11-05 LAB — BASIC METABOLIC PANEL
Anion gap: 9 (ref 5–15)
BUN: 21 mg/dL — AB (ref 6–20)
CO2: 29 mmol/L (ref 22–32)
Calcium: 8.9 mg/dL (ref 8.9–10.3)
Chloride: 94 mmol/L — ABNORMAL LOW (ref 101–111)
Creatinine, Ser: 1.06 mg/dL — ABNORMAL HIGH (ref 0.44–1.00)
GFR calc Af Amer: 53 mL/min — ABNORMAL LOW (ref >60)
GFR calc non Af Amer: 46 mL/min — ABNORMAL LOW (ref >60)
Glucose, Bld: 96 mg/dL (ref 65–99)
POTASSIUM: 3.9 mmol/L (ref 3.5–5.1)
SODIUM: 132 mmol/L — AB (ref 135–145)

## 2014-11-05 LAB — TROPONIN I
Troponin I: <0.03 ng/mL (ref <0.031)
Troponin I: <0.03 ng/mL (ref <0.031)

## 2014-11-05 LAB — CBC
HCT: 34.6 % — ABNORMAL LOW (ref 35.0–47.0)
Hemoglobin: 11.4 g/dL — ABNORMAL LOW (ref 12.0–16.0)
MCH: 27.9 pg (ref 26.0–34.0)
MCHC: 32.8 g/dL (ref 32.0–36.0)
MCV: 84.9 fL (ref 80.0–100.0)
Platelets: 197 10*3/uL (ref 150–440)
RBC: 4.08 MIL/uL (ref 3.80–5.20)
RDW: 15.6 % — AB (ref 11.5–14.5)
WBC: 6.1 10*3/uL (ref 3.6–11.0)

## 2014-11-05 LAB — PROTIME-INR
INR: 2.57
PROTHROMBIN TIME: 27.7 s — AB (ref 11.4–15.0)

## 2014-11-05 MED ORDER — GI COCKTAIL ~~LOC~~
30.0000 mL | Freq: Once | ORAL | Status: AC
  Administered 2014-11-05: 30 mL via ORAL

## 2014-11-05 MED ORDER — GI COCKTAIL ~~LOC~~
ORAL | Status: AC
  Administered 2014-11-05: 30 mL via ORAL
  Filled 2014-11-05: qty 30

## 2014-11-05 NOTE — ED Notes (Signed)
Patient had one minute of chest pain and called nurse. CP has resolved. Will evaluate.

## 2014-11-05 NOTE — ED Provider Notes (Signed)
[facility] Emergency Department Provider Note  ____________________________________________  Time seen: Approximately 4:53 PM  I have reviewed the triage vital signs and the nursing notes.   HISTORY  Chief Complaint Chest Pain    HPI Ana Schultz is a 81 y.o. female who is in the nursing home following a stroke resulting in left-sided paralysis. Patient reports she had 1 minute of indigestion type pain across her chest. It resolved spontaneously. It occurred while she was laying in bed. She's had this before but has not told anybody about it. The pain did not radiate and is not associated with shortness of breath nausea vomiting or sweating. Nothing seemed to change it or make it better or worse. Patient has not had PT for 3-4 weeks because of insurance problems. Patient was receiving PT before that and did not have any problems with chest pain while she was having that. She did have some shortness of breath and has had some shortness of breath. She had a pleural effusion drained at [facility] recently. Patient's only other complaint is crampy pain in the left arm and leg which she's had for some time since the stroke.   Past Medical History  Diagnosis Date  . Dysphagia   . Paroxysmal atrial fibrillation   . Nonrheumatic aortic valve insufficiency   . Hemiplegia and hemiparesis     following cerebral infarction affecting left non-dominant side  . Stroke     Patient Active Problem List   Diagnosis Date Noted  . Dysphagia, pharyngoesophageal phase 10/10/2014  . Aneurysm 10/09/2014  . Airway hyperreactivity 10/09/2014  . Benign essential HTN 10/09/2014  . BP (high blood pressure) 10/09/2014  . OP (osteoporosis) 10/09/2014  . Addison anemia 10/09/2014  . Tobacco abuse, in remission 08/02/2014  . Failure respiratory 07/12/2014  . AI (aortic incompetence) 07/07/2014  . Aneurysm of ascending aorta 07/07/2014    Past Surgical History  Procedure Laterality  Date  . Abdominal hysterectomy    . Aortoiliac bypass      Current Outpatient Rx  Name  Route  Sig  Dispense  Refill  . acetaminophen (TYLENOL) 325 MG tablet   Oral   Take 650 mg by mouth every 4 (four) hours as needed.         . Amino Acids-Protein Hydrolys (FEEDING SUPPLEMENT, PRO-STAT SUGAR FREE 64,) LIQD   Oral   Take 30 mLs by mouth daily.         . Artificial Tear Ointment (ARTIFICIAL TEARS) ointment      as needed.         . Artificial Tear Ointment (REFRESH P.M. OP)   Ophthalmic   Apply to eye 2 (two) times daily.         Marland Kitchen aspirin EC 81 MG tablet   Oral   Take 81 mg by mouth.         Marland Kitchen atorvastatin (LIPITOR) 40 MG tablet   Oral   Take 40 mg by mouth.         . bisacodyl (DULCOLAX) 10 MG suppository   Rectal   Place 10 mg rectally as needed for moderate constipation.         . calcium carbonate (OS-CAL - DOSED IN MG OF ELEMENTAL CALCIUM) 1250 (500 CA) MG tablet   Oral   Take by mouth.         . camphor-menthol (SARNA) lotion   Topical   Apply 1 application topically as needed for itching.         Marland Kitchen  Carboxymethylcellulose Sodium (REFRESH TEARS OP)   Ophthalmic   Apply to eye daily.         . cetirizine (ZYRTEC) 5 MG tablet   Oral   Take 5 mg by mouth at bedtime as needed for allergies.         . clonazePAM (KLONOPIN) 0.5 MG tablet   Oral   Take 0.5 mg by mouth every 12 (twelve) hours.         . cyanocobalamin (,VITAMIN B-12,) 1000 MCG/ML injection   Intramuscular   Inject into the muscle.         . diltiazem (CARDIZEM) 60 MG tablet   Oral   Take 60 mg by mouth.         . diphenhydramine-acetaminophen (TYLENOL PM) 25-500 MG TABS   Oral   Take by mouth.         . docusate sodium (COLACE) 100 MG capsule   Oral   Take 100 mg by mouth.         . fluticasone (FLOVENT HFA) 110 MCG/ACT inhaler   Inhalation   Inhale into the lungs.         . Fluticasone-Salmeterol (ADVAIR) 250-50 MCG/DOSE AEPB   Inhalation    Inhale into the lungs.         . furosemide (LASIX) 40 MG tablet   Oral   Take 40 mg by mouth.         . Incontinence Supply Disposable ([employer] BLADDER CONTROL PAD) MISC               . levofloxacin (LEVAQUIN) 750 MG tablet   Oral   Take 750 mg by mouth daily.         Marland Kitchen lidocaine (LIDODERM) 5 %   Transdermal   Place 1 patch onto the skin daily. Remove & Discard patch within 12 hours or as directed by MD         . LORazepam (ATIVAN) 0.5 MG tablet   Oral   Take 0.5 mg by mouth every 4 (four) hours as needed for anxiety.         . Magnesium Hydroxide (MILK OF MAGNESIA PO)   Oral   Take by mouth daily.         . Melatonin 3 MG TABS      6 mg.         . polyethylene glycol (MIRALAX / GLYCOLAX) packet   Oral   Take 17 g by mouth at bedtime as needed.         . potassium chloride SA (K-DUR,KLOR-CON) 20 MEQ tablet   Oral   Take 40 mEq by mouth daily.         . PSYLLIUM PO   Oral   Take 3.4 g by mouth daily.         . sennosides-docusate sodium (SENOKOT-S) 8.6-50 MG tablet   Oral   Take 1 tablet by mouth 2 (two) times daily.         Marland Kitchen tiotropium (SPIRIVA) 18 MCG inhalation capsule   Inhalation   Place 18 mcg into inhaler and inhale daily.         . traZODone (DESYREL) 50 MG tablet   Oral   Take 50 mg by mouth at bedtime.         . triamcinolone cream (KENALOG) 0.1 %   Topical   Apply 1 application topically 2 (two) times daily.         . Trimethoprim HCl  50 MG/5ML SOLN      100 mg.         . warfarin (COUMADIN) 4 MG tablet   Oral   Take 4 mg by mouth.           Allergies Iodinated diagnostic agents; Ciprofloxacin; Nitrofurantoin; Solifenacin; and Metronidazole  No family history on file.  Social History History  Substance Use Topics  . Smoking status: Former Research scientist (life sciences)  . Smokeless tobacco: Never Used  . Alcohol Use: No    Review of Systems Constitutional: No fever/chills Eyes: No visual changes. ENT: No sore  throat. Cardiovascular: See history of present illness Respiratory: See history of present illness Gastrointestinal: No abdominal pain.  No nausea, no vomiting.  No diarrhea.  No constipation. Genitourinary: Negative for dysuria. Musculoskeletal: Negative for back pain. Skin: Negative for rash.  10-point ROS otherwise negative.  ____________________________________________   PHYSICAL EXAM:  VITAL SIGNS: ED Triage Vitals  Enc Vitals Group     BP 11/05/14 1621 156/59 mmHg     Pulse Rate 11/05/14 1621 76     Resp 11/05/14 1621 18     Temp 11/05/14 1621 98.1 F (36.7 C)     Temp Source 11/05/14 1621 Oral     SpO2 11/05/14 1621 94 %     Weight 11/05/14 1621 120 lb (54.432 kg)     Height 11/05/14 1621 5\' 1"  (1.549 m)     Head Cir --      Peak Flow --      Pain Score 11/05/14 1624 0     Pain Loc --      Pain Edu? --      Excl. in [place]? --     Constitutional: Alert and oriented. Well appearing and in no acute distress. Eyes: Conjunctivae are normal. PERRL. EOMI. Head: Atraumatic. Nose: No congestion/rhinnorhea. Mouth/Throat: Mucous membranes are moist.  Oropharynx non-erythematous. Neck: No stridor.  Cardiovascular: Normal rate, regular rhythm. Grossly normal heart sounds.  Good peripheral circulation. Respiratory: Normal respiratory effort.  No retractions. Lungs crackles in left base Gastrointestinal: Soft and nontender. No distention. No abdominal bruits. No CVA tenderness. Musculoskeletal: No lower extremity tenderness nor edema.  No joint effusions. Neurologic:  Normal speech and language. No gross focal neurologic deficits are appreciated. Speech is normal. No gait instability. Skin:  Skin is warm, dry and intact. No rash noted. Psychiatric: Mood and affect are normal. Speech and behavior are normal.  ____________________________________________   LABS (all labs ordered are listed, but only abnormal results are displayed)  Labs Reviewed  CBC - Abnormal; Notable for  the following:    Hemoglobin 11.4 (*)    HCT 34.6 (*)    RDW 15.6 (*)    All other components within normal limits  BASIC METABOLIC PANEL - Abnormal; Notable for the following:    Sodium 132 (*)    Chloride 94 (*)    BUN 21 (*)    Creatinine, Ser 1.06 (*)    GFR calc non Af Amer 46 (*)    GFR calc Af Amer 53 (*)    All other components within normal limits  PROTIME-INR - Abnormal; Notable for the following:    Prothrombin Time 27.7 (*)    All other components within normal limits  TROPONIN I  TROPONIN I   ____________________________________________  EKG  EKG done in the ER read interpreted by me shows sinus rhythm at a rate of 77 some PACs no acute changes EKG was repeated at 1926 hrs. with chest  pain shows normal sinus rhythm normal axis there are no marked changes from this one to the previous one both of which were read and interpreted by me ____________________________________________  RADIOLOGY  X-ray was reviewed by me shows a left-sided pleural effusion patient has had this effusion drained recently and not sure how much was left I cannot find a description of it on the records from [facility] and [facility] ____________________________________________   PROCEDURES   ____________________________________________   INITIAL IMPRESSION / [place] / ED COURSE  Pertinent labs & imaging results that were available during my care of the patient were reviewed by me and considered in my medical decision making (see chart for details). Patient has chest pain in the emergency room middle of the chest achy in nature no other symptoms no radiation EKG done during that showed no marked changes at all pain resolved rapidly after administration of GI cocktail troponin done after the chest pain was also completely negative   ____________________________________________   FINAL CLINICAL IMPRESSION(S) / ED DIAGNOSES  Final diagnoses:  Chest pain of uncertain etiology       Nena Polio, MD 11/06/14 [phone]

## 2014-11-05 NOTE — Discharge Instructions (Signed)
Chest Pain (Nonspecific) It is often hard to give a diagnosis for the cause of chest pain. There is always a chance that your pain could be related to something serious, such as a heart attack or a blood clot in the lungs. You need to follow up with your doctor. HOME CARE  If antibiotic medicine was given, take it as directed by your doctor. Finish the medicine even if you start to feel better.  For the next few days, avoid activities that bring on chest pain. Continue physical activities as told by your doctor.  Do not use any tobacco products. This includes cigarettes, chewing tobacco, and e-cigarettes.  Avoid drinking alcohol.  Only take medicine as told by your doctor.  Follow your doctor's suggestions for more testing if your chest pain does not go away.  Keep all doctor visits you made. GET HELP IF:  Your chest pain does not go away, even after treatment.  You have a rash with blisters on your chest.  You have a fever. GET HELP RIGHT AWAY IF:   You have more pain or pain that spreads to your arm, neck, jaw, back, or belly (abdomen).  You have shortness of breath.  You cough more than usual or cough up blood.  You have very bad back or belly pain.  You feel sick to your stomach (nauseous) or throw up (vomit).  You have very bad weakness.  You pass out (faint).  You have chills. This is an emergency. Do not wait to see if the problems will go away. Call your local emergency services (911 in [place] Do not drive yourself to the hospital. MAKE SURE YOU:   Understand these instructions.  Will watch your condition.  Will get help right away if you are not doing well or get worse. Document Released: 10/08/2007 Document Revised: 04/26/2013 Document Reviewed: 10/08/2007 [facility] Patient Information 2015 [place], [place]. This information is not intended to replace advice given to you by your health care provider. Make sure you discuss any questions you have with your  health care provider. Please call Dr Clayborn Bigness on Tuesday morning. Tell his office you were in the ER with chest pain. They should be able to see you Tuesday or Wednesday.   Try the calcium carbonate if the chest pain returns. If it does not help within 5-10 minutes, return to the ER.

## 2014-11-07 DIAGNOSIS — R609 Edema, unspecified: Secondary | ICD-10-CM | POA: Diagnosis not present

## 2014-11-07 DIAGNOSIS — E876 Hypokalemia: Secondary | ICD-10-CM | POA: Diagnosis not present

## 2014-11-07 DIAGNOSIS — I4891 Unspecified atrial fibrillation: Secondary | ICD-10-CM | POA: Diagnosis not present

## 2014-11-07 DIAGNOSIS — I509 Heart failure, unspecified: Secondary | ICD-10-CM | POA: Diagnosis not present

## 2014-11-07 LAB — PROTIME-INR
INR: 1.68
PROTHROMBIN TIME: 20 s — AB (ref 11.4–15.0)

## 2014-11-09 DIAGNOSIS — I4891 Unspecified atrial fibrillation: Secondary | ICD-10-CM | POA: Diagnosis not present

## 2014-11-09 LAB — BASIC METABOLIC PANEL
Anion gap: 9 (ref 5–15)
BUN: 21 mg/dL — ABNORMAL HIGH (ref 6–20)
CHLORIDE: 101 mmol/L (ref 101–111)
CO2: 31 mmol/L (ref 22–32)
Calcium: 9.2 mg/dL (ref 8.9–10.3)
Creatinine, Ser: 0.88 mg/dL (ref 0.44–1.00)
GFR calc Af Amer: >60 mL/min (ref >60)
GFR calc non Af Amer: 57 mL/min — ABNORMAL LOW (ref >60)
GLUCOSE: 96 mg/dL (ref 65–99)
POTASSIUM: 3.8 mmol/L (ref 3.5–5.1)
Sodium: 141 mmol/L (ref 135–145)

## 2014-11-14 DIAGNOSIS — I4891 Unspecified atrial fibrillation: Secondary | ICD-10-CM | POA: Diagnosis not present

## 2014-11-14 LAB — PROTIME-INR
INR: 1.69
PROTHROMBIN TIME: 20.1 s — AB (ref 11.4–15.0)

## 2014-11-21 DIAGNOSIS — I4891 Unspecified atrial fibrillation: Secondary | ICD-10-CM | POA: Diagnosis not present

## 2014-11-21 LAB — BASIC METABOLIC PANEL
Anion gap: 8 (ref 5–15)
BUN: 18 mg/dL (ref 6–20)
CHLORIDE: 101 mmol/L (ref 101–111)
CO2: 31 mmol/L (ref 22–32)
Calcium: 9.3 mg/dL (ref 8.9–10.3)
Creatinine, Ser: 0.9 mg/dL (ref 0.44–1.00)
GFR, EST NON AFRICAN AMERICAN: 56 mL/min — AB (ref >60)
GLUCOSE: 85 mg/dL (ref 65–99)
Potassium: 3.8 mmol/L (ref 3.5–5.1)
Sodium: 140 mmol/L (ref 135–145)

## 2014-11-21 LAB — PROTIME-INR
INR: 2.23
PROTHROMBIN TIME: 24.8 s — AB (ref 11.4–15.0)

## 2014-11-28 DIAGNOSIS — I4891 Unspecified atrial fibrillation: Secondary | ICD-10-CM | POA: Diagnosis not present

## 2014-11-28 LAB — BASIC METABOLIC PANEL
ANION GAP: 8 (ref 5–15)
BUN: 16 mg/dL (ref 6–20)
CALCIUM: 9.5 mg/dL (ref 8.9–10.3)
CO2: 30 mmol/L (ref 22–32)
Chloride: 99 mmol/L — ABNORMAL LOW (ref 101–111)
Creatinine, Ser: 0.93 mg/dL (ref 0.44–1.00)
GFR calc Af Amer: >60 mL/min (ref >60)
GFR calc non Af Amer: 54 mL/min — ABNORMAL LOW (ref >60)
Glucose, Bld: 133 mg/dL — ABNORMAL HIGH (ref 65–99)
Potassium: 3.9 mmol/L (ref 3.5–5.1)
SODIUM: 137 mmol/L (ref 135–145)

## 2014-11-28 LAB — PROTIME-INR
INR: 2.83
Prothrombin Time: 29.8 seconds — ABNORMAL HIGH (ref 11.4–15.0)

## 2014-12-04 ENCOUNTER — Encounter
Admission: RE | Admit: 2014-12-04 | Discharge: 2014-12-04 | Disposition: A | Discharge status: Home / Self Care | Payer: Commercial Managed Care - HMO | Source: Ambulatory Visit | Attending: Internal Medicine | Admitting: Internal Medicine

## 2014-12-04 DIAGNOSIS — I480 Paroxysmal atrial fibrillation: Secondary | ICD-10-CM | POA: Insufficient documentation

## 2014-12-05 DIAGNOSIS — I480 Paroxysmal atrial fibrillation: Secondary | ICD-10-CM | POA: Diagnosis not present

## 2014-12-05 LAB — BASIC METABOLIC PANEL
ANION GAP: 8 (ref 5–15)
BUN: 15 mg/dL (ref 6–20)
CALCIUM: 9.4 mg/dL (ref 8.9–10.3)
CO2: 31 mmol/L (ref 22–32)
Chloride: 99 mmol/L — ABNORMAL LOW (ref 101–111)
Creatinine, Ser: 0.89 mg/dL (ref 0.44–1.00)
GFR calc non Af Amer: 57 mL/min — ABNORMAL LOW (ref >60)
GLUCOSE: 131 mg/dL — AB (ref 65–99)
Potassium: 3.8 mmol/L (ref 3.5–5.1)
Sodium: 138 mmol/L (ref 135–145)

## 2014-12-05 LAB — PROTIME-INR
INR: 3.18
Prothrombin Time: 32.6 seconds — ABNORMAL HIGH (ref 11.4–15.0)

## 2014-12-12 ENCOUNTER — Other Ambulatory Visit
Admission: RE | Admit: 2014-12-12 | Discharge: 2014-12-12 | Disposition: A | Discharge status: Home / Self Care | Payer: Commercial Managed Care - HMO | Source: Skilled Nursing Facility | Attending: Gerontology | Admitting: Gerontology

## 2014-12-12 DIAGNOSIS — I4891 Unspecified atrial fibrillation: Secondary | ICD-10-CM | POA: Diagnosis present

## 2014-12-12 LAB — PROTIME-INR
INR: 3.97
PROTHROMBIN TIME: 38.7 s — AB (ref 11.4–15.0)

## 2014-12-14 DIAGNOSIS — I480 Paroxysmal atrial fibrillation: Secondary | ICD-10-CM | POA: Diagnosis not present

## 2014-12-14 LAB — PROTIME-INR
INR: 2.75
Prothrombin Time: 29.2 s — ABNORMAL HIGH (ref 11.4–15.0)

## 2014-12-19 DIAGNOSIS — I480 Paroxysmal atrial fibrillation: Secondary | ICD-10-CM | POA: Diagnosis not present

## 2014-12-19 LAB — PROTIME-INR
INR: 2.3
PROTHROMBIN TIME: 25.4 s — AB (ref 11.4–15.0)

## 2014-12-26 DIAGNOSIS — I480 Paroxysmal atrial fibrillation: Secondary | ICD-10-CM | POA: Diagnosis not present

## 2014-12-26 LAB — PROTIME-INR
INR: 2.76
Prothrombin Time: 29.3 seconds — ABNORMAL HIGH (ref 11.4–15.0)

## 2014-12-29 DIAGNOSIS — H903 Sensorineural hearing loss, bilateral: Secondary | ICD-10-CM | POA: Diagnosis not present

## 2014-12-29 DIAGNOSIS — H6123 Impacted cerumen, bilateral: Secondary | ICD-10-CM | POA: Diagnosis not present

## 2015-01-02 DIAGNOSIS — I480 Paroxysmal atrial fibrillation: Secondary | ICD-10-CM | POA: Diagnosis not present

## 2015-01-02 LAB — PROTIME-INR
INR: 1.95
Prothrombin Time: 22.4 seconds — ABNORMAL HIGH (ref 11.4–15.0)

## 2015-01-04 ENCOUNTER — Encounter
Admission: RE | Admit: 2015-01-04 | Discharge: 2015-01-04 | Disposition: A | Discharge status: Home / Self Care | Payer: Commercial Managed Care - HMO | Source: Ambulatory Visit | Attending: Internal Medicine | Admitting: Internal Medicine

## 2015-01-04 DIAGNOSIS — R609 Edema, unspecified: Secondary | ICD-10-CM | POA: Insufficient documentation

## 2015-01-04 DIAGNOSIS — E876 Hypokalemia: Secondary | ICD-10-CM | POA: Insufficient documentation

## 2015-01-04 DIAGNOSIS — I4891 Unspecified atrial fibrillation: Secondary | ICD-10-CM | POA: Insufficient documentation

## 2015-01-04 DIAGNOSIS — I509 Heart failure, unspecified: Secondary | ICD-10-CM | POA: Insufficient documentation

## 2015-01-09 DIAGNOSIS — I509 Heart failure, unspecified: Secondary | ICD-10-CM | POA: Diagnosis not present

## 2015-01-09 DIAGNOSIS — R609 Edema, unspecified: Secondary | ICD-10-CM | POA: Diagnosis not present

## 2015-01-09 DIAGNOSIS — I4891 Unspecified atrial fibrillation: Secondary | ICD-10-CM | POA: Diagnosis not present

## 2015-01-09 DIAGNOSIS — E876 Hypokalemia: Secondary | ICD-10-CM | POA: Diagnosis not present

## 2015-01-09 LAB — PROTIME-INR
INR: 2.28
Prothrombin Time: 25.3 seconds — ABNORMAL HIGH (ref 11.4–15.0)

## 2015-01-16 DIAGNOSIS — I4891 Unspecified atrial fibrillation: Secondary | ICD-10-CM | POA: Diagnosis not present

## 2015-01-16 LAB — PROTIME-INR
INR: 3.45
PROTHROMBIN TIME: 34.7 s — AB (ref 11.4–15.0)

## 2015-01-18 ENCOUNTER — Other Ambulatory Visit
Admission: RE | Admit: 2015-01-18 | Discharge: 2015-01-18 | Disposition: A | Discharge status: Home / Self Care | Payer: Commercial Managed Care - HMO | Source: Other Acute Inpatient Hospital | Attending: Gerontology | Admitting: Gerontology

## 2015-01-18 DIAGNOSIS — I480 Paroxysmal atrial fibrillation: Secondary | ICD-10-CM | POA: Diagnosis present

## 2015-01-18 LAB — PROTIME-INR
INR: 3.46
Prothrombin Time: 34.8 seconds — ABNORMAL HIGH (ref 11.4–15.0)

## 2015-01-22 DIAGNOSIS — I4891 Unspecified atrial fibrillation: Secondary | ICD-10-CM | POA: Diagnosis not present

## 2015-01-22 LAB — PROTIME-INR
INR: 1.93
Prothrombin Time: 22.2 seconds — ABNORMAL HIGH (ref 11.4–15.0)

## 2015-01-25 DIAGNOSIS — I4891 Unspecified atrial fibrillation: Secondary | ICD-10-CM | POA: Diagnosis not present

## 2015-01-25 LAB — PROTIME-INR
INR: 1.08
PROTHROMBIN TIME: 14.2 s (ref 11.4–15.0)

## 2015-01-30 DIAGNOSIS — I4891 Unspecified atrial fibrillation: Secondary | ICD-10-CM | POA: Diagnosis not present

## 2015-01-30 LAB — PROTIME-INR
INR: 1.45
PROTHROMBIN TIME: 17.8 s — AB (ref 11.4–15.0)

## 2015-02-02 ENCOUNTER — Inpatient Hospital Stay: Admit: 2015-02-02 | Payer: Self-pay

## 2015-02-02 DIAGNOSIS — I4891 Unspecified atrial fibrillation: Secondary | ICD-10-CM | POA: Diagnosis not present

## 2015-02-02 LAB — PROTIME-INR
INR: 2.1
PROTHROMBIN TIME: 23.7 s — AB (ref 11.4–15.0)

## 2015-02-03 ENCOUNTER — Inpatient Hospital Stay: Admission: RE | Admit: 2015-02-03 | Payer: Self-pay | Source: Ambulatory Visit | Admitting: Internal Medicine

## 2015-02-03 ENCOUNTER — Encounter
Admission: RE | Admit: 2015-02-03 | Discharge: 2015-02-03 | Disposition: A | Discharge status: Home / Self Care | Payer: Commercial Managed Care - HMO | Source: Ambulatory Visit | Attending: Internal Medicine | Admitting: Internal Medicine

## 2015-02-03 DIAGNOSIS — I10 Essential (primary) hypertension: Secondary | ICD-10-CM | POA: Insufficient documentation

## 2015-02-03 DIAGNOSIS — I480 Paroxysmal atrial fibrillation: Secondary | ICD-10-CM | POA: Insufficient documentation

## 2015-02-09 DIAGNOSIS — I10 Essential (primary) hypertension: Secondary | ICD-10-CM | POA: Diagnosis not present

## 2015-02-09 DIAGNOSIS — I480 Paroxysmal atrial fibrillation: Secondary | ICD-10-CM | POA: Diagnosis not present

## 2015-02-09 LAB — CBC WITH DIFFERENTIAL/PLATELET
Basophils Absolute: 0.2 10*3/uL — ABNORMAL HIGH (ref 0–0.1)
Basophils Relative: 4 %
EOS PCT: 5 %
Eosinophils Absolute: 0.2 10*3/uL (ref 0–0.7)
HEMATOCRIT: 30.9 % — AB (ref 35.0–47.0)
Hemoglobin: 10.7 g/dL — ABNORMAL LOW (ref 12.0–16.0)
LYMPHS PCT: 32 %
Lymphs Abs: 1.5 10*3/uL (ref 1.0–3.6)
MCH: 30.4 pg (ref 26.0–34.0)
MCHC: 34.6 g/dL (ref 32.0–36.0)
MCV: 88 fL (ref 80.0–100.0)
MONO ABS: 0.4 10*3/uL (ref 0.2–0.9)
MONOS PCT: 8 %
NEUTROS ABS: 2.4 10*3/uL (ref 1.4–6.5)
Neutrophils Relative %: 51 %
Platelets: 180 10*3/uL (ref 150–440)
RBC: 3.51 MIL/uL — ABNORMAL LOW (ref 3.80–5.20)
RDW: 15.8 % — ABNORMAL HIGH (ref 11.5–14.5)
WBC: 4.6 10*3/uL (ref 3.6–11.0)

## 2015-02-09 LAB — PROTIME-INR
INR: 1.88
PROTHROMBIN TIME: 21.8 s — AB (ref 11.4–15.0)

## 2015-02-09 LAB — VITAMIN B12: Vitamin B-12: 392 pg/mL (ref 180–914)

## 2015-02-09 LAB — COMPREHENSIVE METABOLIC PANEL
ALT: 18 U/L (ref 14–54)
ANION GAP: 5 (ref 5–15)
AST: 22 U/L (ref 15–41)
Albumin: 3.5 g/dL (ref 3.5–5.0)
Alkaline Phosphatase: 83 U/L (ref 38–126)
BILIRUBIN TOTAL: 0.7 mg/dL (ref 0.3–1.2)
BUN: 19 mg/dL (ref 6–20)
CHLORIDE: 105 mmol/L (ref 101–111)
CO2: 31 mmol/L (ref 22–32)
Calcium: 9 mg/dL (ref 8.9–10.3)
Creatinine, Ser: 0.94 mg/dL (ref 0.44–1.00)
GFR, EST NON AFRICAN AMERICAN: 53 mL/min — AB (ref >60)
Glucose, Bld: 102 mg/dL — ABNORMAL HIGH (ref 65–99)
Potassium: 3.7 mmol/L (ref 3.5–5.1)
Sodium: 141 mmol/L (ref 135–145)
TOTAL PROTEIN: 5.7 g/dL — AB (ref 6.5–8.1)

## 2015-02-09 LAB — LIPID PANEL
CHOL/HDL RATIO: 1.8 ratio
Cholesterol: 127 mg/dL (ref 0–200)
HDL: 72 mg/dL (ref >40)
LDL Cholesterol: 49 mg/dL (ref 0–99)
TRIGLYCERIDES: 28 mg/dL (ref <150)
VLDL: 6 mg/dL (ref 0–40)

## 2015-02-09 LAB — TSH: TSH: 1.842 u[IU]/mL (ref 0.350–4.500)

## 2015-02-09 LAB — MAGNESIUM: Magnesium: 1.7 mg/dL (ref 1.7–2.4)

## 2015-02-10 LAB — VITAMIN D 25 HYDROXY (VIT D DEFICIENCY, FRACTURES): VIT D 25 HYDROXY: 34.3 ng/mL (ref 30.0–100.0)

## 2015-02-13 DIAGNOSIS — I480 Paroxysmal atrial fibrillation: Secondary | ICD-10-CM | POA: Diagnosis not present

## 2015-02-13 LAB — PROTIME-INR
INR: 2.12
PROTHROMBIN TIME: 23.9 s — AB (ref 11.4–15.0)

## 2015-02-20 ENCOUNTER — Other Ambulatory Visit
Admission: RE | Admit: 2015-02-20 | Discharge: 2015-02-20 | Disposition: A | Discharge status: Home / Self Care | Payer: Commercial Managed Care - HMO | Source: Skilled Nursing Facility | Attending: Internal Medicine | Admitting: Internal Medicine

## 2015-02-20 DIAGNOSIS — I480 Paroxysmal atrial fibrillation: Secondary | ICD-10-CM | POA: Diagnosis present

## 2015-02-20 LAB — PROTIME-INR
INR: 2.73
PROTHROMBIN TIME: 29 s — AB (ref 11.4–15.0)

## 2015-02-27 ENCOUNTER — Encounter: Payer: Self-pay | Admitting: *Deleted

## 2015-02-28 DIAGNOSIS — I480 Paroxysmal atrial fibrillation: Secondary | ICD-10-CM | POA: Diagnosis not present

## 2015-02-28 LAB — PROTIME-INR
INR: 2.79
PROTHROMBIN TIME: 29.5 s — AB (ref 11.4–15.0)

## 2015-03-06 ENCOUNTER — Encounter
Admission: RE | Admit: 2015-03-06 | Discharge: 2015-03-06 | Disposition: A | Discharge status: Home / Self Care | Payer: Commercial Managed Care - HMO | Source: Ambulatory Visit | Attending: Gerontology | Admitting: Gerontology

## 2015-03-06 ENCOUNTER — Encounter
Admission: RE | Admit: 2015-03-06 | Discharge: 2015-03-06 | Disposition: A | Discharge status: Home / Self Care | Payer: Commercial Managed Care - HMO | Source: Ambulatory Visit | Attending: Internal Medicine | Admitting: Internal Medicine

## 2015-03-06 DIAGNOSIS — R5383 Other fatigue: Secondary | ICD-10-CM | POA: Diagnosis not present

## 2015-03-06 DIAGNOSIS — I480 Paroxysmal atrial fibrillation: Secondary | ICD-10-CM | POA: Insufficient documentation

## 2015-03-06 DIAGNOSIS — I10 Essential (primary) hypertension: Secondary | ICD-10-CM | POA: Insufficient documentation

## 2015-03-06 LAB — PROTIME-INR
INR: 2.76
PROTHROMBIN TIME: 29.3 s — AB (ref 11.4–15.0)

## 2015-03-09 ENCOUNTER — Ambulatory Visit: Payer: Commercial Managed Care - HMO | Admitting: Urology

## 2015-03-09 DIAGNOSIS — I480 Paroxysmal atrial fibrillation: Secondary | ICD-10-CM | POA: Diagnosis present

## 2015-03-09 DIAGNOSIS — I10 Essential (primary) hypertension: Secondary | ICD-10-CM | POA: Diagnosis not present

## 2015-03-09 LAB — URINALYSIS COMPLETE WITH MICROSCOPIC (ARMC ONLY)
Bacteria, UA: NONE SEEN
Bilirubin Urine: NEGATIVE
Glucose, UA: NEGATIVE mg/dL
Hgb urine dipstick: NEGATIVE
Ketones, ur: NEGATIVE mg/dL
Leukocytes, UA: NEGATIVE
Nitrite: NEGATIVE
PH: 7 (ref 5.0–8.0)
PROTEIN: NEGATIVE mg/dL
RBC / HPF: NONE SEEN RBC/hpf (ref 0–5)
SQUAMOUS EPITHELIAL / LPF: NONE SEEN
Specific Gravity, Urine: 1.004 — ABNORMAL LOW (ref 1.005–1.030)

## 2015-03-11 LAB — URINE CULTURE: CULTURE: NO GROWTH

## 2015-03-13 DIAGNOSIS — I480 Paroxysmal atrial fibrillation: Secondary | ICD-10-CM | POA: Diagnosis not present

## 2015-03-13 LAB — PROTIME-INR
INR: 3.24
Prothrombin Time: 33.1 seconds — ABNORMAL HIGH (ref 11.4–15.0)

## 2015-03-20 DIAGNOSIS — I480 Paroxysmal atrial fibrillation: Secondary | ICD-10-CM | POA: Diagnosis not present

## 2015-03-20 LAB — PROTIME-INR
INR: 3.61
PROTHROMBIN TIME: 35.2 s — AB (ref 11.4–15.0)

## 2015-03-22 DIAGNOSIS — I480 Paroxysmal atrial fibrillation: Secondary | ICD-10-CM | POA: Diagnosis not present

## 2015-03-22 LAB — PROTIME-INR
INR: 2.05
Prothrombin Time: 23 seconds — ABNORMAL HIGH (ref 11.4–15.0)

## 2015-03-28 DIAGNOSIS — I480 Paroxysmal atrial fibrillation: Secondary | ICD-10-CM | POA: Diagnosis not present

## 2015-03-28 LAB — PROTIME-INR
INR: 2.05
Prothrombin Time: 23 seconds — ABNORMAL HIGH (ref 11.4–15.0)

## 2015-04-03 DIAGNOSIS — I480 Paroxysmal atrial fibrillation: Secondary | ICD-10-CM | POA: Diagnosis not present

## 2015-04-03 LAB — PROTIME-INR
INR: 2.49
Prothrombin Time: 26.6 seconds — ABNORMAL HIGH (ref 11.4–15.0)

## 2015-04-05 ENCOUNTER — Encounter
Admission: RE | Admit: 2015-04-05 | Discharge: 2015-04-05 | Disposition: A | Discharge status: Home / Self Care | Payer: Commercial Managed Care - HMO | Source: Ambulatory Visit | Attending: Internal Medicine | Admitting: Internal Medicine

## 2015-04-05 DIAGNOSIS — I480 Paroxysmal atrial fibrillation: Secondary | ICD-10-CM | POA: Insufficient documentation

## 2015-04-05 DIAGNOSIS — R410 Disorientation, unspecified: Secondary | ICD-10-CM | POA: Insufficient documentation

## 2015-04-07 DIAGNOSIS — R410 Disorientation, unspecified: Secondary | ICD-10-CM | POA: Diagnosis present

## 2015-04-07 DIAGNOSIS — I480 Paroxysmal atrial fibrillation: Secondary | ICD-10-CM | POA: Diagnosis present

## 2015-04-07 LAB — URINALYSIS COMPLETE WITH MICROSCOPIC (ARMC ONLY)
BACTERIA UA: NONE SEEN
Bilirubin Urine: NEGATIVE
GLUCOSE, UA: NEGATIVE mg/dL
HGB URINE DIPSTICK: NEGATIVE
Ketones, ur: NEGATIVE mg/dL
Leukocytes, UA: NEGATIVE
NITRITE: NEGATIVE
Protein, ur: NEGATIVE mg/dL
Specific Gravity, Urine: 1.006 (ref 1.005–1.030)
pH: 7 (ref 5.0–8.0)

## 2015-04-09 LAB — URINE CULTURE: Culture: NO GROWTH

## 2015-04-10 DIAGNOSIS — I480 Paroxysmal atrial fibrillation: Secondary | ICD-10-CM | POA: Diagnosis not present

## 2015-04-10 LAB — PROTIME-INR
INR: 2.1
PROTHROMBIN TIME: 23.4 s — AB (ref 11.4–15.0)

## 2015-04-17 DIAGNOSIS — I480 Paroxysmal atrial fibrillation: Secondary | ICD-10-CM | POA: Diagnosis not present

## 2015-04-17 LAB — PROTIME-INR
INR: 1.85
Prothrombin Time: 21.3 seconds — ABNORMAL HIGH (ref 11.4–15.0)

## 2015-04-24 DIAGNOSIS — I480 Paroxysmal atrial fibrillation: Secondary | ICD-10-CM | POA: Diagnosis not present

## 2015-04-24 LAB — PROTIME-INR
INR: 1.74
Prothrombin Time: 20.3 seconds — ABNORMAL HIGH (ref 11.4–15.0)

## 2015-05-01 DIAGNOSIS — I480 Paroxysmal atrial fibrillation: Secondary | ICD-10-CM | POA: Diagnosis not present

## 2015-05-01 LAB — PROTIME-INR
INR: 3.14
PROTHROMBIN TIME: 31.7 s — AB (ref 11.4–15.0)

## 2015-05-06 ENCOUNTER — Encounter
Admission: RE | Admit: 2015-05-06 | Discharge: 2015-05-06 | Disposition: A | Discharge status: Home / Self Care | Payer: PPO | Source: Ambulatory Visit | Attending: Internal Medicine | Admitting: Internal Medicine

## 2015-05-06 DIAGNOSIS — I480 Paroxysmal atrial fibrillation: Secondary | ICD-10-CM | POA: Insufficient documentation

## 2015-05-08 DIAGNOSIS — I480 Paroxysmal atrial fibrillation: Secondary | ICD-10-CM | POA: Diagnosis not present

## 2015-05-08 LAB — PROTIME-INR
INR: 2.69
PROTHROMBIN TIME: 28.2 s — AB (ref 11.4–15.0)

## 2015-05-15 DIAGNOSIS — I480 Paroxysmal atrial fibrillation: Secondary | ICD-10-CM | POA: Diagnosis not present

## 2015-05-15 LAB — PROTIME-INR
INR: 2.19
Prothrombin Time: 24.2 seconds — ABNORMAL HIGH (ref 11.4–15.0)

## 2015-05-22 DIAGNOSIS — I480 Paroxysmal atrial fibrillation: Secondary | ICD-10-CM | POA: Diagnosis not present

## 2015-05-22 LAB — PROTIME-INR
INR: 2.22
Prothrombin Time: 24.4 seconds — ABNORMAL HIGH (ref 11.4–15.0)

## 2015-05-23 DIAGNOSIS — F419 Anxiety disorder, unspecified: Secondary | ICD-10-CM | POA: Diagnosis not present

## 2015-05-23 DIAGNOSIS — G4700 Insomnia, unspecified: Secondary | ICD-10-CM | POA: Diagnosis not present

## 2015-05-29 DIAGNOSIS — I480 Paroxysmal atrial fibrillation: Secondary | ICD-10-CM | POA: Diagnosis not present

## 2015-05-29 LAB — PROTIME-INR
INR: 2.33
Prothrombin Time: 25.3 seconds — ABNORMAL HIGH (ref 11.4–15.0)

## 2015-06-05 DIAGNOSIS — I480 Paroxysmal atrial fibrillation: Secondary | ICD-10-CM | POA: Diagnosis not present

## 2015-06-05 LAB — PROTIME-INR
INR: 2.34
Prothrombin Time: 25.4 seconds — ABNORMAL HIGH (ref 11.4–15.0)

## 2015-06-06 ENCOUNTER — Encounter
Admission: RE | Admit: 2015-06-06 | Discharge: 2015-06-06 | Disposition: A | Discharge status: Home / Self Care | Payer: Commercial Managed Care - HMO | Source: Ambulatory Visit | Attending: Internal Medicine | Admitting: Internal Medicine

## 2015-06-06 DIAGNOSIS — I480 Paroxysmal atrial fibrillation: Secondary | ICD-10-CM | POA: Insufficient documentation

## 2015-06-19 DIAGNOSIS — I480 Paroxysmal atrial fibrillation: Secondary | ICD-10-CM | POA: Diagnosis not present

## 2015-06-19 LAB — PROTIME-INR
INR: 3.02
PROTHROMBIN TIME: 30.8 s — AB (ref 11.4–15.0)

## 2015-06-26 DIAGNOSIS — I480 Paroxysmal atrial fibrillation: Secondary | ICD-10-CM | POA: Diagnosis not present

## 2015-06-26 LAB — PROTIME-INR
INR: 4.1
PROTHROMBIN TIME: 38.7 s — AB (ref 11.4–15.0)

## 2015-07-04 ENCOUNTER — Encounter
Admission: RE | Admit: 2015-07-04 | Discharge: 2015-07-04 | Disposition: A | Discharge status: Home / Self Care | Payer: PPO | Source: Ambulatory Visit | Attending: Internal Medicine | Admitting: Internal Medicine

## 2015-07-04 DIAGNOSIS — I480 Paroxysmal atrial fibrillation: Secondary | ICD-10-CM | POA: Insufficient documentation

## 2015-07-04 LAB — PROTIME-INR
INR: 3.21
Prothrombin Time: 32.2 seconds — ABNORMAL HIGH (ref 11.4–15.0)

## 2015-07-06 DIAGNOSIS — I480 Paroxysmal atrial fibrillation: Secondary | ICD-10-CM | POA: Diagnosis not present

## 2015-07-06 LAB — PROTIME-INR
INR: 2.06
PROTHROMBIN TIME: 23.1 s — AB (ref 11.4–15.0)

## 2015-07-10 DIAGNOSIS — I480 Paroxysmal atrial fibrillation: Secondary | ICD-10-CM | POA: Diagnosis not present

## 2015-07-10 LAB — PROTIME-INR
INR: 2.18
PROTHROMBIN TIME: 24.1 s — AB (ref 11.4–15.0)

## 2015-08-04 ENCOUNTER — Encounter
Admission: RE | Admit: 2015-08-04 | Discharge: 2015-08-04 | Disposition: A | Discharge status: Home / Self Care | Payer: PPO | Source: Ambulatory Visit | Attending: Internal Medicine | Admitting: Internal Medicine

## 2015-08-04 DIAGNOSIS — I480 Paroxysmal atrial fibrillation: Secondary | ICD-10-CM | POA: Insufficient documentation

## 2015-08-18 DIAGNOSIS — I480 Paroxysmal atrial fibrillation: Secondary | ICD-10-CM | POA: Diagnosis present

## 2015-08-18 LAB — BASIC METABOLIC PANEL
Anion gap: 6 (ref 5–15)
BUN: 23 mg/dL — ABNORMAL HIGH (ref 6–20)
CALCIUM: 8.6 mg/dL — AB (ref 8.9–10.3)
CO2: 28 mmol/L (ref 22–32)
CREATININE: 0.91 mg/dL (ref 0.44–1.00)
Chloride: 102 mmol/L (ref 101–111)
GFR, EST NON AFRICAN AMERICAN: 55 mL/min — AB (ref >60)
Glucose, Bld: 98 mg/dL (ref 65–99)
Potassium: 3.4 mmol/L — ABNORMAL LOW (ref 3.5–5.1)
SODIUM: 136 mmol/L (ref 135–145)

## 2015-08-18 LAB — PROTIME-INR
INR: 2.37
PROTHROMBIN TIME: 25.6 s — AB (ref 11.4–15.0)

## 2015-08-23 DIAGNOSIS — I480 Paroxysmal atrial fibrillation: Secondary | ICD-10-CM | POA: Diagnosis not present

## 2015-08-23 LAB — BASIC METABOLIC PANEL
Anion gap: 5 (ref 5–15)
BUN: 26 mg/dL — AB (ref 6–20)
CHLORIDE: 103 mmol/L (ref 101–111)
CO2: 33 mmol/L — ABNORMAL HIGH (ref 22–32)
Calcium: 9.3 mg/dL (ref 8.9–10.3)
Creatinine, Ser: 1.09 mg/dL — ABNORMAL HIGH (ref 0.44–1.00)
GFR calc Af Amer: 51 mL/min — ABNORMAL LOW (ref >60)
GFR, EST NON AFRICAN AMERICAN: 44 mL/min — AB (ref >60)
GLUCOSE: 88 mg/dL (ref 65–99)
POTASSIUM: 3.9 mmol/L (ref 3.5–5.1)
Sodium: 141 mmol/L (ref 135–145)

## 2015-08-23 LAB — PROTIME-INR
INR: 1.77
Prothrombin Time: 20.6 seconds — ABNORMAL HIGH (ref 11.4–15.0)

## 2015-08-30 DIAGNOSIS — I480 Paroxysmal atrial fibrillation: Secondary | ICD-10-CM | POA: Diagnosis not present

## 2015-08-30 LAB — PROTIME-INR
INR: 3.13
Prothrombin Time: 31.6 seconds — ABNORMAL HIGH (ref 11.4–15.0)

## 2015-09-03 ENCOUNTER — Encounter
Admission: RE | Admit: 2015-09-03 | Discharge: 2015-09-03 | Disposition: A | Discharge status: Home / Self Care | Payer: Commercial Managed Care - HMO | Source: Ambulatory Visit | Attending: Internal Medicine | Admitting: Internal Medicine

## 2015-09-10 ENCOUNTER — Other Ambulatory Visit: Payer: Self-pay | Admitting: Otolaryngology

## 2015-09-10 DIAGNOSIS — R1310 Dysphagia, unspecified: Secondary | ICD-10-CM

## 2015-09-20 ENCOUNTER — Institutional Professional Consult (permissible substitution): Payer: Self-pay | Admitting: Internal Medicine

## 2015-10-04 ENCOUNTER — Encounter: Admission: RE | Admit: 2015-10-04 | Payer: PPO | Source: Ambulatory Visit | Admitting: Internal Medicine

## 2015-10-10 ENCOUNTER — Institutional Professional Consult (permissible substitution): Payer: Self-pay | Admitting: Pulmonary Disease

## 2015-10-12 ENCOUNTER — Ambulatory Visit: Payer: PPO

## 2015-11-09 ENCOUNTER — Encounter: Payer: Self-pay | Admitting: Internal Medicine

## 2015-11-09 ENCOUNTER — Encounter (INDEPENDENT_AMBULATORY_CARE_PROVIDER_SITE_OTHER): Payer: Self-pay

## 2015-11-09 ENCOUNTER — Ambulatory Visit (INDEPENDENT_AMBULATORY_CARE_PROVIDER_SITE_OTHER): Payer: Medicare Other | Admitting: Internal Medicine

## 2015-11-09 VITALS — BP 140/78 | HR 60 | Wt 123.0 lb

## 2015-11-09 DIAGNOSIS — J449 Chronic obstructive pulmonary disease, unspecified: Secondary | ICD-10-CM | POA: Insufficient documentation

## 2015-11-09 DIAGNOSIS — J441 Chronic obstructive pulmonary disease with (acute) exacerbation: Secondary | ICD-10-CM

## 2015-11-09 MED ORDER — ALBUTEROL SULFATE HFA 108 (90 BASE) MCG/ACT IN AERS: 2.0000 | INHALATION_SPRAY | RESPIRATORY_TRACT | Status: AC | PRN

## 2015-11-09 MED ORDER — PREDNISONE 50 MG PO TABS: 50.0000 mg | ORAL_TABLET | Freq: Every day | ORAL | Status: DC

## 2015-11-09 MED ORDER — ALBUTEROL SULFATE HFA 108 (90 BASE) MCG/ACT IN AERS: 2.0000 | INHALATION_SPRAY | RESPIRATORY_TRACT | Status: DC | PRN

## 2015-11-09 NOTE — Progress Notes (Signed)
Ana Schultz Pulmonary Medicine Consultation      Date: 11/09/2015,   MRN# [id number] Ana Schultz 02-04-1926 Code Status:  Code Status History    This patient does not have a recorded code status. Please follow your organizational policy for patients in this situation.     Hosp day:@LENGTHOFSTAYDAYS @ Referring MD: @ATDPROV @     PCP:      AdmissionWeight: 123 lb (55.792 kg)                 CurrentWeight: 123 lb (55.792 kg) Ana Schultz is a 82 y.o. old female seen in consultation for cough at the request of Dr. Pryor Ochoa.     CHIEF COMPLAINT:   Cough for 1 month   HISTORY OF PRESENT ILLNESS   82 yo white female seen today for chronic cough for 1 month and with  hoarseness, patient states that she intermittently has productive cough That has progressively worsened over last couple of weeks, patient was given oral abx and was referred tp Dr. Pryor Ochoa  Patient has been on inhaler therapy for COPD/ASTHMA and seems to be doing well with this regimen  Patient with chronic CVA from aortic aneurysmal repair, patient previously had PEG tube, and is accompanied by daughter Patient seems to  have no signs or symptoms of aspiration at this time  It is noted that patient has had intermittent wheezing and bouts of coughing episodes.     PAST MEDICAL HISTORY   Past Medical History  Diagnosis Date  . Dysphagia   . Paroxysmal atrial fibrillation (HCC)   . Nonrheumatic aortic valve insufficiency   . Hemiplegia and hemiparesis ([place])     following cerebral infarction affecting left non-dominant side  . Stroke ([place])   . Atrophic vaginitis   . Osteoporosis   . COPD (chronic obstructive pulmonary disease) ([place])   . Diverticulosis   . Adenoma of rectum   . Anemia   . Microscopic hematuria   . Adrenal adenoma   . Frequent UTI   . Renal cyst   . Urge incontinence      SURGICAL HISTORY   Past Surgical History  Procedure Laterality Date  . Abdominal hysterectomy    .  Aortoiliac bypass       FAMILY HISTORY   Family History  Problem Relation Age of Onset  . Lung cancer Son      SOCIAL HISTORY   Social History  Substance Use Topics  . Smoking status: Former Research scientist (life sciences)  . Smokeless tobacco: Never Used  . Alcohol Use: No     MEDICATIONS    Home Medication:  Current Outpatient Rx  Name  Route  Sig  Dispense  Refill  . acetaminophen (TYLENOL) 325 MG tablet   Oral   Take 650 mg by mouth every 4 (four) hours as needed.         Marland Kitchen apixaban (ELIQUIS) 2.5 MG TABS tablet   Oral   Take 2.5 mg by mouth 2 (two) times daily.         . Artificial Tear Ointment (REFRESH P.M. OP)   Ophthalmic   Apply to eye 2 (two) times daily.         Marland Kitchen aspirin EC 81 MG tablet   Oral   Take 81 mg by mouth.         Marland Kitchen atorvastatin (LIPITOR) 20 MG tablet   Oral   Take 20 mg by mouth daily.         . benzonatate (TESSALON) 100  MG capsule   Oral   Take 100 mg by mouth every 8 (eight) hours as needed for cough.         . bisacodyl (DULCOLAX) 10 MG suppository   Rectal   Place 10 mg rectally as needed for moderate constipation.         . calcium carbonate (OS-CAL - DOSED IN MG OF ELEMENTAL CALCIUM) 1250 (500 CA) MG tablet   Oral   Take by mouth.         . camphor-menthol (SARNA) lotion   Topical   Apply 1 application topically as needed for itching.         . Carboxymethylcellulose Sodium (REFRESH TEARS OP)   Ophthalmic   Apply to eye daily.         . clonazePAM (KLONOPIN) 0.5 MG tablet   Oral   Take 0.5 mg by mouth every 12 (twelve) hours.         . cyanocobalamin (,VITAMIN B-12,) 1000 MCG/ML injection   Intramuscular   Inject into the muscle.         . diltiazem (CARDIZEM) 60 MG tablet   Oral   Take 60 mg by mouth daily.         . fluticasone-salmeterol (ADVAIR HFA) 230-21 MCG/ACT inhaler   Inhalation   Inhale 2 puffs into the lungs 2 (two) times daily.         . furosemide (LASIX) 40 MG tablet   Oral   Take 60  mg by mouth.         . Incontinence Supply Disposable ([employer] BLADDER CONTROL PAD) MISC               . Melatonin 3 MG TABS      6 mg.         . omeprazole (PRILOSEC) 20 MG capsule   Oral   Take 20 mg by mouth daily.         . potassium chloride SA (K-DUR,KLOR-CON) 20 MEQ tablet   Oral   Take 40 mEq by mouth daily.         . sennosides-docusate sodium (SENOKOT-S) 8.6-50 MG tablet   Oral   Take 1 tablet by mouth 2 (two) times daily.         . sertraline (ZOLOFT) 50 MG tablet   Oral   Take 50 mg by mouth daily.         Marland Kitchen tiotropium (SPIRIVA) 18 MCG inhalation capsule   Inhalation   Place 18 mcg into inhaler and inhale daily.         Marland Kitchen triamcinolone cream (KENALOG) 0.1 %   Topical   Apply 1 application topically 2 (two) times daily.         . Trimethoprim HCl 50 MG/5ML SOLN      100 mg.         . albuterol (PROVENTIL HFA;VENTOLIN HFA) 108 (90 Base) MCG/ACT inhaler   Inhalation   Inhale 2 puffs into the lungs every 4 (four) hours as needed for wheezing or shortness of breath.   1 Inhaler   2   . cetirizine (ZYRTEC) 5 MG tablet   Oral   Take 5 mg by mouth at bedtime as needed for allergies. Reported on 11/09/2015         . EXPIRED: fluticasone (FLOVENT HFA) 110 MCG/ACT inhaler   Inhalation   Inhale into the lungs.         Marland Kitchen EXPIRED: warfarin (COUMADIN) 4 MG tablet  Oral   Take 4 mg by mouth.           Current Medication:  Current outpatient prescriptions:  .  acetaminophen (TYLENOL) 325 MG tablet, Take 650 mg by mouth every 4 (four) hours as needed., Disp: , Rfl:  .  apixaban (ELIQUIS) 2.5 MG TABS tablet, Take 2.5 mg by mouth 2 (two) times daily., Disp: , Rfl:  .  Artificial Tear Ointment (REFRESH P.M. OP), Apply to eye 2 (two) times daily., Disp: , Rfl:  .  aspirin EC 81 MG tablet, Take 81 mg by mouth., Disp: , Rfl:  .  atorvastatin (LIPITOR) 20 MG tablet, Take 20 mg by mouth daily., Disp: , Rfl:  .  benzonatate (TESSALON) 100 MG  capsule, Take 100 mg by mouth every 8 (eight) hours as needed for cough., Disp: , Rfl:  .  bisacodyl (DULCOLAX) 10 MG suppository, Place 10 mg rectally as needed for moderate constipation., Disp: , Rfl:  .  calcium carbonate (OS-CAL - DOSED IN MG OF ELEMENTAL CALCIUM) 1250 (500 CA) MG tablet, Take by mouth., Disp: , Rfl:  .  camphor-menthol (SARNA) lotion, Apply 1 application topically as needed for itching., Disp: , Rfl:  .  Carboxymethylcellulose Sodium (REFRESH TEARS OP), Apply to eye daily., Disp: , Rfl:  .  clonazePAM (KLONOPIN) 0.5 MG tablet, Take 0.5 mg by mouth every 12 (twelve) hours., Disp: , Rfl:  .  cyanocobalamin (,VITAMIN B-12,) 1000 MCG/ML injection, Inject into the muscle., Disp: , Rfl:  .  diltiazem (CARDIZEM) 60 MG tablet, Take 60 mg by mouth daily., Disp: , Rfl:  .  fluticasone-salmeterol (ADVAIR HFA) 230-21 MCG/ACT inhaler, Inhale 2 puffs into the lungs 2 (two) times daily., Disp: , Rfl:  .  furosemide (LASIX) 40 MG tablet, Take 60 mg by mouth., Disp: , Rfl:  .  Incontinence Supply Disposable ([employer] BLADDER CONTROL PAD) MISC, , Disp: , Rfl:  .  Melatonin 3 MG TABS, 6 mg., Disp: , Rfl:  .  omeprazole (PRILOSEC) 20 MG capsule, Take 20 mg by mouth daily., Disp: , Rfl:  .  potassium chloride SA (K-DUR,KLOR-CON) 20 MEQ tablet, Take 40 mEq by mouth daily., Disp: , Rfl:  .  sennosides-docusate sodium (SENOKOT-S) 8.6-50 MG tablet, Take 1 tablet by mouth 2 (two) times daily., Disp: , Rfl:  .  sertraline (ZOLOFT) 50 MG tablet, Take 50 mg by mouth daily., Disp: , Rfl:  .  tiotropium (SPIRIVA) 18 MCG inhalation capsule, Place 18 mcg into inhaler and inhale daily., Disp: , Rfl:  .  triamcinolone cream (KENALOG) 0.1 %, Apply 1 application topically 2 (two) times daily., Disp: , Rfl:  .  Trimethoprim HCl 50 MG/5ML SOLN, 100 mg., Disp: , Rfl:  .  albuterol (PROVENTIL HFA;VENTOLIN HFA) 108 (90 Base) MCG/ACT inhaler, Inhale 2 puffs into the lungs every 4 (four) hours as needed for wheezing or  shortness of breath., Disp: 1 Inhaler, Rfl: 2 .  cetirizine (ZYRTEC) 5 MG tablet, Take 5 mg by mouth at bedtime as needed for allergies. Reported on 11/09/2015, Disp: , Rfl:  .  fluticasone (FLOVENT HFA) 110 MCG/ACT inhaler, Inhale into the lungs., Disp: , Rfl:  .  warfarin (COUMADIN) 4 MG tablet, Take 4 mg by mouth., Disp: , Rfl:     ALLERGIES   Iodinated diagnostic agents; Ciprofloxacin; Nitrofurantoin; Solifenacin; and Metronidazole     REVIEW OF SYSTEMS   Review of Systems  Constitutional: Negative for fever, chills, weight loss, malaise/fatigue and diaphoresis.  HENT: Negative for congestion and  hearing loss.   Eyes: Negative for blurred vision and double vision.  Respiratory: Positive for cough, sputum production and wheezing. Negative for shortness of breath.   Cardiovascular: Negative for chest pain, palpitations and orthopnea.  Gastrointestinal: Negative for heartburn, nausea, vomiting and abdominal pain.  Genitourinary: Negative for dysuria and urgency.  Musculoskeletal: Negative for myalgias, back pain and neck pain.  Skin: Negative for rash.  Neurological: Negative for dizziness, tingling, tremors, weakness and headaches.  Endo/Heme/Allergies: Does not bruise/bleed easily.  Psychiatric/Behavioral: Negative for depression, suicidal ideas and substance abuse.  All other systems reviewed and are negative.    VS: BP 140/78 mmHg  Pulse 60  Wt 123 lb (55.792 kg)  SpO2 93%     PHYSICAL EXAM  Physical Exam  Constitutional: She is oriented to person, place, and time. She appears well-developed and well-nourished. No distress.  HENT:  Mouth/Throat: No oropharyngeal exudate.  Eyes: Pupils are equal, round, and reactive to light. No scleral icterus.  Neck: Normal range of motion. Neck supple.  Cardiovascular: Normal rate, regular rhythm and normal heart sounds.   No murmur heard. Pulmonary/Chest: No stridor. No respiratory distress. She has no wheezes. She has no rales.   Abdominal: Soft. Bowel sounds are normal.  Musculoskeletal: She exhibits no edema.  Neurological: She is alert and oriented to person, place, and time.  Skin: Skin is warm. She is not diaphoretic.  Psychiatric: She has a normal mood and affect.          ASSESSMENT/PLAN   82 yo white female with h/o COPD and h/o CVA with chronic cough with intermittent wheezing with productive cough Likely related to acute bronchitis with mild COPD exacerbation  1.start prednisone 50 mg daily for 10 days 2.albuterol as needed 3.refer to speech therapist for swallow eval  Follow up in 2 weeks for interval changes    The Patient requires high complexity decision making for assessment and support, frequent evaluation and titration of therapies, application of advanced monitoring technologies and extensive interpretation of multiple databases.   Patient/Family are satisfied with Plan of action and management. All questions answered  Ana Schultz, M.D.  [facility]  Medical Director [place] Director [facility] Cardio-Pulmonary Department

## 2015-11-09 NOTE — Patient Instructions (Signed)
Prednisone 40 mg daily for 10 days Albuterol as needed  Acute Bronchitis Bronchitis is inflammation of the airways that extend from the windpipe into the lungs (bronchi). The inflammation often causes mucus to develop. This leads to a cough, which is the most common symptom of bronchitis.  In acute bronchitis, the condition usually develops suddenly and goes away over time, usually in a couple weeks. Smoking, allergies, and asthma can make bronchitis worse. Repeated episodes of bronchitis may cause further lung problems.  CAUSES Acute bronchitis is most often caused by the same virus that causes a cold. The virus can spread from person to person (contagious) through coughing, sneezing, and touching contaminated objects. SIGNS AND SYMPTOMS   Cough.   Fever.   Coughing up mucus.   Body aches.   Chest congestion.   Chills.   Shortness of breath.   Sore throat.  DIAGNOSIS  Acute bronchitis is usually diagnosed through a physical exam. Your health care provider will also ask you questions about your medical history. Tests, such as chest X-rays, are sometimes done to rule out other conditions.  TREATMENT  Acute bronchitis usually goes away in a couple weeks. Oftentimes, no medical treatment is necessary. Medicines are sometimes given for relief of fever or cough. Antibiotic medicines are usually not needed but may be prescribed in certain situations. In some cases, an inhaler may be recommended to help reduce shortness of breath and control the cough. A cool mist vaporizer may also be used to help thin bronchial secretions and make it easier to clear the chest.  HOME CARE INSTRUCTIONS  Get plenty of rest.   Drink enough fluids to keep your urine clear or pale yellow (unless you have a medical condition that requires fluid restriction). Increasing fluids may help thin your respiratory secretions (sputum) and reduce chest congestion, and it will prevent dehydration.   Take  medicines only as directed by your health care provider.  If you were prescribed an antibiotic medicine, finish it all even if you start to feel better.  Avoid smoking and secondhand smoke. Exposure to cigarette smoke or irritating chemicals will make bronchitis worse. If you are a smoker, consider using nicotine gum or skin patches to help control withdrawal symptoms. Quitting smoking will help your lungs heal faster.   Reduce the chances of another bout of acute bronchitis by washing your hands frequently, avoiding people with cold symptoms, and trying not to touch your hands to your mouth, nose, or eyes.   Keep all follow-up visits as directed by your health care provider.  SEEK MEDICAL CARE IF: Your symptoms do not improve after 1 week of treatment.  SEEK IMMEDIATE MEDICAL CARE IF:  You develop an increased fever or chills.   You have chest pain.   You have severe shortness of breath.  You have bloody sputum.   You develop dehydration.  You faint or repeatedly feel like you are going to pass out.  You develop repeated vomiting.  You develop a severe headache. MAKE SURE YOU:   Understand these instructions.  Will watch your condition.  Will get help right away if you are not doing well or get worse.   This information is not intended to replace advice given to you by your health care provider. Make sure you discuss any questions you have with your health care provider.   Document Released: 05/29/2004 Document Revised: 05/12/2014 Document Reviewed: 10/12/2012 Elsevier Interactive Patient Education [employer].

## 2015-11-13 ENCOUNTER — Ambulatory Visit
Admission: RE | Admit: 2015-11-13 | Discharge: 2015-11-13 | Disposition: A | Discharge status: Home / Self Care | Payer: Medicare Other | Source: Ambulatory Visit | Attending: Otolaryngology | Admitting: Otolaryngology

## 2015-11-13 DIAGNOSIS — R1310 Dysphagia, unspecified: Secondary | ICD-10-CM | POA: Diagnosis not present

## 2015-11-13 NOTE — Therapy (Addendum)
[place], [place], [address] Phone: [phone]   Fax:     Modified Barium Swallow  Patient Details  Name: Ana Schultz MRN: [id number] Date of Birth: Jan 17, 1926 No Data Recorded  Encounter Date: 11/13/2015   Subjective: Patient behavior: (alertness, ability to follow instructions, etc.): pt alert, verbally conversive. Noted mild Left labial-facial weakness on Left residual from old CVA ~1.5 years ago. Pt followed instructions appropriately.  Chief complaint: denied any problems w/ the oropharyngeal phase of swallowing. Endorsed s/s of REFLUX w/ meals; c/o increased saliva w/ intermittent throat clearing. Has noted increased throat clearing at night. Pt does have Dysphonia(per ENT) w/ hoarseness. 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 This is characteristic of LPR.  Pt does have c/o difficulties eating some of the meals at the NH d/t the heavy sauces and spicy foods served there. She also stated she feels she is NOT receiving a PPI for her Reflux(baseline) but instead was given "TUMS when I had reflux last time".    Objective:  Radiological Procedure: A videoflouroscopic evaluation of oral-preparatory, reflex initiation, and pharyngeal phases of the swallow was performed; as well as a screening of the upper esophageal phase.  I. POSTURE: upright II. VIEW: lateral III. COMPENSATORY STRATEGIES: f/u, dry swallow appeared to clear any remaining, min. Oropharyngeal residue.  IV. BOLUSES ADMINISTERED:  Thin Liquid: 5 trials  Nectar-thick Liquid: 1 trial  Honey-thick Liquid: NT  Puree: 3 trials  Mechanical Soft: 1 trial V. RESULTS OF EVALUATION: A. ORAL PREPARATORY PHASE: (The lips, tongue, and velum are observed for strength and coordination)       **Overall Severity Rating: grossly wfl-mild. Pt  exhibited slight decreased bolus control during the oral phase w/ min. premature spillage of liquid trials. Noted min. inconsistent, Oral phase and base of tongue residue. A f/u, dry swallow appeared to clear any residue remaining.    B. SWALLOW INITIATION/REFLEX: (The reflex is normal if "triggered" by the time the bolus reached the base of the tongue)  **Overall Severity Rating: grossly wfl-mild. Bolus material w/ thin liquid trials spilled into the pharynx as pharyngeal swallow initiation occurred. There was NO laryngeal penetration or aspiration that occurred during this study. Pt was able to achieve adequate airway closure during the swallow.   C. PHARYNGEAL PHASE: (Pharyngeal function is normal if the bolus shows rapid, smooth, and continuous transit through the pharynx and there is no pharyngeal residue after the swallow)  **Overall Severity Rating: grossly wfl. Pt exhibited adequate pharyngeal clearing of bolus residue indicating adequate laryngeal excursion and pharyngeal pressure during the swallow. Any slight amount of pharyngeal residue remaining cleared w/ a f/u swallow b/t trials/viewing.   D. LARYNGEAL PENETRATION: (Material entering into the laryngeal inlet/vestibule but not aspirated): NONE  E. ASPIRATION: NONE F. ESOPHAGEAL PHASE: (Screening of the upper esophagus): no observed Esophageal dysmotility at the level of the cervical Esophagus w/ boluses given.   ASSESSMENT: Pt appeared to present w/ grossly Wfl-Mild Oropharyngeal phase dysphagia c/b mild delay in pharyngeal swallow initiation w/ thin liquids, however, NO laryngeal penetration or aspiration was noted to occur during this study. Pt was able to achieve sufficient airway closure during the swallow. Pt does not appear at increased risk from prandial aspiration. Pt exhibited min. premature spillage of liquid trials, however, this did not appear to impact the pharyngeal phase of swallowing. Noted inconsistent min. bolus residue of  trials post swallowing as well as inconsistent, slight pharyngeal residue w/ trials. When pt used the strategy of a f/u, dry swallow, it appeared to clear the oral and pharyngeal residue that was noted.  Suspect the presentation of inconsistent, oral residue as described could be related to pt's dentition/partials as well as the increased saliva pt stated she experiences. Pharyngeal swallow initiation/timing can change d/t the aging factor as well as d/t LPR which can decrease sensation in the pharynx and larynx. LPR can also impact the pharyngeal tissue and the ability to fully clear pharyngeal residue. As noted, pt was successful at using the strategy of a f/u, dry swallow w/ bolus trials when needed.   PLAN/RECOMMENDATIONS:  A. Diet: mech soft/regular; thin liquids - Cut Meats. REDUCE the amount of sauces and gravies on foods/meats IF spicy. Provide a non-spicy gravy or a soup/broth to moisten foods and other non-reflux inciting foods. Recommend Medications be given in PUREE - cut small, or crushed, or in liquid form. Consult Pharmacy for suggestions.    B. Swallowing Precautions: general aspiration precautions to include a f/u, dry swallow when needed to aid clearing any remaining oropharyngeal residue.  Strict REFLUX precautions.   C. Recommended consultation to GI/ENT for full assessment and management of Reflux/GERD. Pt stated she is NOT receiving her PPI at the NH(recent change in NHs).  D. Therapy recommendations: continue f/u w/ Speech Therapy services for Dysphonia; education of Reflux precautions.  E. Results and recommendations were discussed w/ pt and sent to NH, MD; video viewed w/ pt.      End of Session - November 20, 2015 1330    Visit Number 1   Number of Visits 1   Date for SLP Re-Evaluation Nov 20, 2015   SLP Start Time 9   SLP Stop Time  1330   SLP Time Calculation (min) 60 min   Activity Tolerance Patient tolerated treatment well      Past Medical History  Diagnosis Date  .  Dysphagia   . Paroxysmal atrial fibrillation (HCC)   . Nonrheumatic aortic valve insufficiency   . Hemiplegia and hemiparesis ([place])     following cerebral infarction affecting left non-dominant side  . Stroke ([place])   . Atrophic vaginitis   . Osteoporosis   . COPD (chronic obstructive pulmonary disease) ([facility])   . Diverticulosis   . Adenoma of rectum   . Anemia   . Microscopic hematuria   . Adrenal adenoma   . Frequent UTI   . Renal cyst   . Urge incontinence   Addendum: REFLUX, per pt report  Past Surgical History  Procedure Laterality Date  . Abdominal hysterectomy    . Aortoiliac bypass      There were no vitals filed for this visit.                Dysphagia - Plan: DG OP Swallowing Func-Medicare/Speech Path, DG OP Swallowing Func-Medicare/Speech Path      G-Codes - 11-20-15 1331    Functional Assessment Tool Used clinical judgement   Functional Limitations Swallowing   Swallow Current Status [id number]) At least 1 percent but less than 20 percent impaired, limited or restricted   Swallow Goal Status [id number]) At least 1 percent but less than 20 percent impaired, limited or restricted   Swallow Discharge Status [id number]) At least 1 percent but less than 20 percent impaired, limited or restricted          Problem List Patient Active Problem List   Diagnosis Date Noted  .  COPD exacerbation ([facility] [facility]) 11/09/2015  . Dysphagia, pharyngoesophageal phase 10/10/2014  . Aneurysm ([facility]) 10/09/2014  . Airway hyperreactivity 10/09/2014  . Benign essential HTN 10/09/2014  . BP (high blood pressure) 10/09/2014  . OP (osteoporosis) 10/09/2014  . Addison anemia 10/09/2014  . Tobacco abuse, in remission 08/02/2014  . Failure respiratory ([facility]) 07/12/2014  . AI (aortic incompetence) 07/07/2014  . Aneurysm of ascending aorta (HCC) 07/07/2014  Addendum:  REFLUX, per pt report     Orinda Kenner, MS, CCC-SLP  Brendin Situ 11/13/2015, 1:32 PM  [facility] [place], [place], [address] Phone: [phone]   Fax:     Name: JENNICA ALTEN MRN: [id number] Date of Birth: 22-Aug-1925

## 2015-11-15 DIAGNOSIS — I351 Nonrheumatic aortic (valve) insufficiency: Secondary | ICD-10-CM | POA: Diagnosis not present

## 2015-11-15 DIAGNOSIS — I69354 Hemiplegia and hemiparesis following cerebral infarction affecting left non-dominant side: Secondary | ICD-10-CM | POA: Diagnosis not present

## 2015-11-15 DIAGNOSIS — I480 Paroxysmal atrial fibrillation: Secondary | ICD-10-CM | POA: Diagnosis not present

## 2015-11-15 DIAGNOSIS — G510 Bell's palsy: Secondary | ICD-10-CM | POA: Diagnosis not present

## 2015-11-23 DIAGNOSIS — M6281 Muscle weakness (generalized): Secondary | ICD-10-CM | POA: Diagnosis not present

## 2015-11-23 DIAGNOSIS — I69321 Dysphasia following cerebral infarction: Secondary | ICD-10-CM | POA: Diagnosis not present

## 2015-11-23 DIAGNOSIS — Z7901 Long term (current) use of anticoagulants: Secondary | ICD-10-CM | POA: Diagnosis not present

## 2015-11-23 DIAGNOSIS — I69354 Hemiplegia and hemiparesis following cerebral infarction affecting left non-dominant side: Secondary | ICD-10-CM | POA: Diagnosis not present

## 2015-11-23 DIAGNOSIS — Z9181 History of falling: Secondary | ICD-10-CM | POA: Diagnosis not present

## 2015-11-23 DIAGNOSIS — I351 Nonrheumatic aortic (valve) insufficiency: Secondary | ICD-10-CM | POA: Diagnosis not present

## 2015-11-23 DIAGNOSIS — F419 Anxiety disorder, unspecified: Secondary | ICD-10-CM | POA: Diagnosis not present

## 2015-11-23 DIAGNOSIS — I480 Paroxysmal atrial fibrillation: Secondary | ICD-10-CM | POA: Diagnosis not present

## 2015-11-23 DIAGNOSIS — I10 Essential (primary) hypertension: Secondary | ICD-10-CM | POA: Diagnosis not present

## 2015-11-23 DIAGNOSIS — Z7982 Long term (current) use of aspirin: Secondary | ICD-10-CM | POA: Diagnosis not present

## 2015-11-23 DIAGNOSIS — J45909 Unspecified asthma, uncomplicated: Secondary | ICD-10-CM | POA: Diagnosis not present

## 2015-11-26 DIAGNOSIS — J45909 Unspecified asthma, uncomplicated: Secondary | ICD-10-CM | POA: Diagnosis not present

## 2015-11-26 DIAGNOSIS — I480 Paroxysmal atrial fibrillation: Secondary | ICD-10-CM | POA: Diagnosis not present

## 2015-11-26 DIAGNOSIS — I10 Essential (primary) hypertension: Secondary | ICD-10-CM | POA: Diagnosis not present

## 2015-11-26 DIAGNOSIS — I69354 Hemiplegia and hemiparesis following cerebral infarction affecting left non-dominant side: Secondary | ICD-10-CM | POA: Diagnosis not present

## 2015-11-26 DIAGNOSIS — M6281 Muscle weakness (generalized): Secondary | ICD-10-CM | POA: Diagnosis not present

## 2015-11-26 DIAGNOSIS — I69321 Dysphasia following cerebral infarction: Secondary | ICD-10-CM | POA: Diagnosis not present

## 2015-11-27 DIAGNOSIS — I69321 Dysphasia following cerebral infarction: Secondary | ICD-10-CM | POA: Diagnosis not present

## 2015-11-27 DIAGNOSIS — I69354 Hemiplegia and hemiparesis following cerebral infarction affecting left non-dominant side: Secondary | ICD-10-CM | POA: Diagnosis not present

## 2015-11-27 DIAGNOSIS — I10 Essential (primary) hypertension: Secondary | ICD-10-CM | POA: Diagnosis not present

## 2015-11-27 DIAGNOSIS — M6281 Muscle weakness (generalized): Secondary | ICD-10-CM | POA: Diagnosis not present

## 2015-11-27 DIAGNOSIS — J45909 Unspecified asthma, uncomplicated: Secondary | ICD-10-CM | POA: Diagnosis not present

## 2015-11-27 DIAGNOSIS — I480 Paroxysmal atrial fibrillation: Secondary | ICD-10-CM | POA: Diagnosis not present

## 2015-11-28 DIAGNOSIS — M6281 Muscle weakness (generalized): Secondary | ICD-10-CM | POA: Diagnosis not present

## 2015-11-28 DIAGNOSIS — I69321 Dysphasia following cerebral infarction: Secondary | ICD-10-CM | POA: Diagnosis not present

## 2015-11-28 DIAGNOSIS — I10 Essential (primary) hypertension: Secondary | ICD-10-CM | POA: Diagnosis not present

## 2015-11-28 DIAGNOSIS — I69354 Hemiplegia and hemiparesis following cerebral infarction affecting left non-dominant side: Secondary | ICD-10-CM | POA: Diagnosis not present

## 2015-11-28 DIAGNOSIS — J45909 Unspecified asthma, uncomplicated: Secondary | ICD-10-CM | POA: Diagnosis not present

## 2015-11-28 DIAGNOSIS — I480 Paroxysmal atrial fibrillation: Secondary | ICD-10-CM | POA: Diagnosis not present

## 2015-11-30 DIAGNOSIS — J45909 Unspecified asthma, uncomplicated: Secondary | ICD-10-CM | POA: Diagnosis not present

## 2015-11-30 DIAGNOSIS — I69354 Hemiplegia and hemiparesis following cerebral infarction affecting left non-dominant side: Secondary | ICD-10-CM | POA: Diagnosis not present

## 2015-11-30 DIAGNOSIS — I480 Paroxysmal atrial fibrillation: Secondary | ICD-10-CM | POA: Diagnosis not present

## 2015-11-30 DIAGNOSIS — M6281 Muscle weakness (generalized): Secondary | ICD-10-CM | POA: Diagnosis not present

## 2015-11-30 DIAGNOSIS — I69321 Dysphasia following cerebral infarction: Secondary | ICD-10-CM | POA: Diagnosis not present

## 2015-11-30 DIAGNOSIS — I10 Essential (primary) hypertension: Secondary | ICD-10-CM | POA: Diagnosis not present

## 2015-12-01 DIAGNOSIS — I480 Paroxysmal atrial fibrillation: Secondary | ICD-10-CM | POA: Diagnosis not present

## 2015-12-01 DIAGNOSIS — I69321 Dysphasia following cerebral infarction: Secondary | ICD-10-CM | POA: Diagnosis not present

## 2015-12-01 DIAGNOSIS — M6281 Muscle weakness (generalized): Secondary | ICD-10-CM | POA: Diagnosis not present

## 2015-12-01 DIAGNOSIS — I69354 Hemiplegia and hemiparesis following cerebral infarction affecting left non-dominant side: Secondary | ICD-10-CM | POA: Diagnosis not present

## 2015-12-01 DIAGNOSIS — J45909 Unspecified asthma, uncomplicated: Secondary | ICD-10-CM | POA: Diagnosis not present

## 2015-12-01 DIAGNOSIS — I10 Essential (primary) hypertension: Secondary | ICD-10-CM | POA: Diagnosis not present

## 2015-12-03 DIAGNOSIS — I69321 Dysphasia following cerebral infarction: Secondary | ICD-10-CM | POA: Diagnosis not present

## 2015-12-03 DIAGNOSIS — I10 Essential (primary) hypertension: Secondary | ICD-10-CM | POA: Diagnosis not present

## 2015-12-03 DIAGNOSIS — M6281 Muscle weakness (generalized): Secondary | ICD-10-CM | POA: Diagnosis not present

## 2015-12-03 DIAGNOSIS — J45909 Unspecified asthma, uncomplicated: Secondary | ICD-10-CM | POA: Diagnosis not present

## 2015-12-03 DIAGNOSIS — I69354 Hemiplegia and hemiparesis following cerebral infarction affecting left non-dominant side: Secondary | ICD-10-CM | POA: Diagnosis not present

## 2015-12-03 DIAGNOSIS — I480 Paroxysmal atrial fibrillation: Secondary | ICD-10-CM | POA: Diagnosis not present

## 2015-12-04 DIAGNOSIS — I69354 Hemiplegia and hemiparesis following cerebral infarction affecting left non-dominant side: Secondary | ICD-10-CM | POA: Diagnosis not present

## 2015-12-04 DIAGNOSIS — M6281 Muscle weakness (generalized): Secondary | ICD-10-CM | POA: Diagnosis not present

## 2015-12-04 DIAGNOSIS — I69321 Dysphasia following cerebral infarction: Secondary | ICD-10-CM | POA: Diagnosis not present

## 2015-12-04 DIAGNOSIS — I10 Essential (primary) hypertension: Secondary | ICD-10-CM | POA: Diagnosis not present

## 2015-12-04 DIAGNOSIS — I480 Paroxysmal atrial fibrillation: Secondary | ICD-10-CM | POA: Diagnosis not present

## 2015-12-04 DIAGNOSIS — J45909 Unspecified asthma, uncomplicated: Secondary | ICD-10-CM | POA: Diagnosis not present

## 2015-12-05 DIAGNOSIS — J45909 Unspecified asthma, uncomplicated: Secondary | ICD-10-CM | POA: Diagnosis not present

## 2015-12-05 DIAGNOSIS — M6281 Muscle weakness (generalized): Secondary | ICD-10-CM | POA: Diagnosis not present

## 2015-12-05 DIAGNOSIS — I480 Paroxysmal atrial fibrillation: Secondary | ICD-10-CM | POA: Diagnosis not present

## 2015-12-05 DIAGNOSIS — I69321 Dysphasia following cerebral infarction: Secondary | ICD-10-CM | POA: Diagnosis not present

## 2015-12-05 DIAGNOSIS — I10 Essential (primary) hypertension: Secondary | ICD-10-CM | POA: Diagnosis not present

## 2015-12-05 DIAGNOSIS — I69354 Hemiplegia and hemiparesis following cerebral infarction affecting left non-dominant side: Secondary | ICD-10-CM | POA: Diagnosis not present

## 2015-12-06 DIAGNOSIS — I69354 Hemiplegia and hemiparesis following cerebral infarction affecting left non-dominant side: Secondary | ICD-10-CM | POA: Diagnosis not present

## 2015-12-06 DIAGNOSIS — I480 Paroxysmal atrial fibrillation: Secondary | ICD-10-CM | POA: Diagnosis not present

## 2015-12-06 DIAGNOSIS — I10 Essential (primary) hypertension: Secondary | ICD-10-CM | POA: Diagnosis not present

## 2015-12-06 DIAGNOSIS — M6281 Muscle weakness (generalized): Secondary | ICD-10-CM | POA: Diagnosis not present

## 2015-12-06 DIAGNOSIS — I69321 Dysphasia following cerebral infarction: Secondary | ICD-10-CM | POA: Diagnosis not present

## 2015-12-06 DIAGNOSIS — J45909 Unspecified asthma, uncomplicated: Secondary | ICD-10-CM | POA: Diagnosis not present

## 2015-12-07 ENCOUNTER — Ambulatory Visit: Payer: Self-pay | Admitting: Internal Medicine

## 2015-12-07 DIAGNOSIS — I69354 Hemiplegia and hemiparesis following cerebral infarction affecting left non-dominant side: Secondary | ICD-10-CM | POA: Diagnosis not present

## 2015-12-07 DIAGNOSIS — I69321 Dysphasia following cerebral infarction: Secondary | ICD-10-CM | POA: Diagnosis not present

## 2015-12-07 DIAGNOSIS — J45909 Unspecified asthma, uncomplicated: Secondary | ICD-10-CM | POA: Diagnosis not present

## 2015-12-07 DIAGNOSIS — I10 Essential (primary) hypertension: Secondary | ICD-10-CM | POA: Diagnosis not present

## 2015-12-07 DIAGNOSIS — I480 Paroxysmal atrial fibrillation: Secondary | ICD-10-CM | POA: Diagnosis not present

## 2015-12-07 DIAGNOSIS — M6281 Muscle weakness (generalized): Secondary | ICD-10-CM | POA: Diagnosis not present

## 2015-12-10 DIAGNOSIS — M6281 Muscle weakness (generalized): Secondary | ICD-10-CM | POA: Diagnosis not present

## 2015-12-10 DIAGNOSIS — I480 Paroxysmal atrial fibrillation: Secondary | ICD-10-CM | POA: Diagnosis not present

## 2015-12-10 DIAGNOSIS — I69321 Dysphasia following cerebral infarction: Secondary | ICD-10-CM | POA: Diagnosis not present

## 2015-12-10 DIAGNOSIS — J45909 Unspecified asthma, uncomplicated: Secondary | ICD-10-CM | POA: Diagnosis not present

## 2015-12-10 DIAGNOSIS — I69354 Hemiplegia and hemiparesis following cerebral infarction affecting left non-dominant side: Secondary | ICD-10-CM | POA: Diagnosis not present

## 2015-12-10 DIAGNOSIS — I10 Essential (primary) hypertension: Secondary | ICD-10-CM | POA: Diagnosis not present

## 2015-12-11 DIAGNOSIS — Z8673 Personal history of transient ischemic attack (TIA), and cerebral infarction without residual deficits: Secondary | ICD-10-CM | POA: Diagnosis not present

## 2015-12-11 DIAGNOSIS — M79604 Pain in right leg: Secondary | ICD-10-CM | POA: Diagnosis not present

## 2015-12-12 DIAGNOSIS — J45909 Unspecified asthma, uncomplicated: Secondary | ICD-10-CM | POA: Diagnosis not present

## 2015-12-12 DIAGNOSIS — M6281 Muscle weakness (generalized): Secondary | ICD-10-CM | POA: Diagnosis not present

## 2015-12-12 DIAGNOSIS — I69321 Dysphasia following cerebral infarction: Secondary | ICD-10-CM | POA: Diagnosis not present

## 2015-12-12 DIAGNOSIS — I480 Paroxysmal atrial fibrillation: Secondary | ICD-10-CM | POA: Diagnosis not present

## 2015-12-12 DIAGNOSIS — I10 Essential (primary) hypertension: Secondary | ICD-10-CM | POA: Diagnosis not present

## 2015-12-12 DIAGNOSIS — I69354 Hemiplegia and hemiparesis following cerebral infarction affecting left non-dominant side: Secondary | ICD-10-CM | POA: Diagnosis not present

## 2015-12-13 DIAGNOSIS — I69321 Dysphasia following cerebral infarction: Secondary | ICD-10-CM | POA: Diagnosis not present

## 2015-12-13 DIAGNOSIS — J45909 Unspecified asthma, uncomplicated: Secondary | ICD-10-CM | POA: Diagnosis not present

## 2015-12-13 DIAGNOSIS — M6281 Muscle weakness (generalized): Secondary | ICD-10-CM | POA: Diagnosis not present

## 2015-12-13 DIAGNOSIS — I10 Essential (primary) hypertension: Secondary | ICD-10-CM | POA: Diagnosis not present

## 2015-12-13 DIAGNOSIS — I69354 Hemiplegia and hemiparesis following cerebral infarction affecting left non-dominant side: Secondary | ICD-10-CM | POA: Diagnosis not present

## 2015-12-13 DIAGNOSIS — I480 Paroxysmal atrial fibrillation: Secondary | ICD-10-CM | POA: Diagnosis not present

## 2015-12-14 DIAGNOSIS — M6281 Muscle weakness (generalized): Secondary | ICD-10-CM | POA: Diagnosis not present

## 2015-12-14 DIAGNOSIS — I69354 Hemiplegia and hemiparesis following cerebral infarction affecting left non-dominant side: Secondary | ICD-10-CM | POA: Diagnosis not present

## 2015-12-14 DIAGNOSIS — I69321 Dysphasia following cerebral infarction: Secondary | ICD-10-CM | POA: Diagnosis not present

## 2015-12-14 DIAGNOSIS — J45909 Unspecified asthma, uncomplicated: Secondary | ICD-10-CM | POA: Diagnosis not present

## 2015-12-14 DIAGNOSIS — I480 Paroxysmal atrial fibrillation: Secondary | ICD-10-CM | POA: Diagnosis not present

## 2015-12-14 DIAGNOSIS — I10 Essential (primary) hypertension: Secondary | ICD-10-CM | POA: Diagnosis not present

## 2015-12-17 DIAGNOSIS — I69354 Hemiplegia and hemiparesis following cerebral infarction affecting left non-dominant side: Secondary | ICD-10-CM | POA: Diagnosis not present

## 2015-12-17 DIAGNOSIS — M6281 Muscle weakness (generalized): Secondary | ICD-10-CM | POA: Diagnosis not present

## 2015-12-17 DIAGNOSIS — I480 Paroxysmal atrial fibrillation: Secondary | ICD-10-CM | POA: Diagnosis not present

## 2015-12-17 DIAGNOSIS — I69321 Dysphasia following cerebral infarction: Secondary | ICD-10-CM | POA: Diagnosis not present

## 2015-12-17 DIAGNOSIS — J45909 Unspecified asthma, uncomplicated: Secondary | ICD-10-CM | POA: Diagnosis not present

## 2015-12-17 DIAGNOSIS — I10 Essential (primary) hypertension: Secondary | ICD-10-CM | POA: Diagnosis not present

## 2015-12-18 DIAGNOSIS — I10 Essential (primary) hypertension: Secondary | ICD-10-CM | POA: Diagnosis not present

## 2015-12-18 DIAGNOSIS — I69321 Dysphasia following cerebral infarction: Secondary | ICD-10-CM | POA: Diagnosis not present

## 2015-12-18 DIAGNOSIS — I480 Paroxysmal atrial fibrillation: Secondary | ICD-10-CM | POA: Diagnosis not present

## 2015-12-18 DIAGNOSIS — I69354 Hemiplegia and hemiparesis following cerebral infarction affecting left non-dominant side: Secondary | ICD-10-CM | POA: Diagnosis not present

## 2015-12-18 DIAGNOSIS — J45909 Unspecified asthma, uncomplicated: Secondary | ICD-10-CM | POA: Diagnosis not present

## 2015-12-18 DIAGNOSIS — M6281 Muscle weakness (generalized): Secondary | ICD-10-CM | POA: Diagnosis not present

## 2015-12-19 DIAGNOSIS — I10 Essential (primary) hypertension: Secondary | ICD-10-CM | POA: Diagnosis not present

## 2015-12-19 DIAGNOSIS — J45909 Unspecified asthma, uncomplicated: Secondary | ICD-10-CM | POA: Diagnosis not present

## 2015-12-19 DIAGNOSIS — M6281 Muscle weakness (generalized): Secondary | ICD-10-CM | POA: Diagnosis not present

## 2015-12-19 DIAGNOSIS — I69354 Hemiplegia and hemiparesis following cerebral infarction affecting left non-dominant side: Secondary | ICD-10-CM | POA: Diagnosis not present

## 2015-12-19 DIAGNOSIS — I480 Paroxysmal atrial fibrillation: Secondary | ICD-10-CM | POA: Diagnosis not present

## 2015-12-19 DIAGNOSIS — I69321 Dysphasia following cerebral infarction: Secondary | ICD-10-CM | POA: Diagnosis not present

## 2015-12-21 DIAGNOSIS — J45909 Unspecified asthma, uncomplicated: Secondary | ICD-10-CM | POA: Diagnosis not present

## 2015-12-21 DIAGNOSIS — I10 Essential (primary) hypertension: Secondary | ICD-10-CM | POA: Diagnosis not present

## 2015-12-21 DIAGNOSIS — I69321 Dysphasia following cerebral infarction: Secondary | ICD-10-CM | POA: Diagnosis not present

## 2015-12-21 DIAGNOSIS — M6281 Muscle weakness (generalized): Secondary | ICD-10-CM | POA: Diagnosis not present

## 2015-12-21 DIAGNOSIS — I69354 Hemiplegia and hemiparesis following cerebral infarction affecting left non-dominant side: Secondary | ICD-10-CM | POA: Diagnosis not present

## 2015-12-21 DIAGNOSIS — I480 Paroxysmal atrial fibrillation: Secondary | ICD-10-CM | POA: Diagnosis not present

## 2015-12-24 ENCOUNTER — Encounter: Payer: Self-pay | Admitting: Internal Medicine

## 2015-12-24 ENCOUNTER — Ambulatory Visit (INDEPENDENT_AMBULATORY_CARE_PROVIDER_SITE_OTHER): Payer: Medicare Other | Admitting: Internal Medicine

## 2015-12-24 VITALS — BP 132/62 | HR 68 | Wt 125.0 lb

## 2015-12-24 DIAGNOSIS — M6281 Muscle weakness (generalized): Secondary | ICD-10-CM | POA: Diagnosis not present

## 2015-12-24 DIAGNOSIS — J410 Simple chronic bronchitis: Secondary | ICD-10-CM | POA: Diagnosis not present

## 2015-12-24 DIAGNOSIS — I480 Paroxysmal atrial fibrillation: Secondary | ICD-10-CM | POA: Diagnosis not present

## 2015-12-24 DIAGNOSIS — J45909 Unspecified asthma, uncomplicated: Secondary | ICD-10-CM | POA: Diagnosis not present

## 2015-12-24 DIAGNOSIS — I10 Essential (primary) hypertension: Secondary | ICD-10-CM | POA: Diagnosis not present

## 2015-12-24 DIAGNOSIS — I69321 Dysphasia following cerebral infarction: Secondary | ICD-10-CM | POA: Diagnosis not present

## 2015-12-24 DIAGNOSIS — I69354 Hemiplegia and hemiparesis following cerebral infarction affecting left non-dominant side: Secondary | ICD-10-CM | POA: Diagnosis not present

## 2015-12-24 MED ORDER — PREDNISONE 20 MG PO TABS: 20.0000 mg | ORAL_TABLET | Freq: Every day | ORAL | 5 refills | Status: DC

## 2015-12-24 NOTE — Progress Notes (Signed)
[facility] Consultation      Date: 12/24/2015,   MRN# [id number] SEVYNN MCCONNAUGHEY May 12, 1925 Code Status:  Code Status History    This patient does not have a recorded code status. Please follow your organizational policy for patients in this situation.     Hosp day:@LENGTHOFSTAYDAYS @ Referring MD: @ATDPROV @     PCP:      Admission                  Current  HALEY TRESCOTT is a 82 y.o. old female seen in consultation for cough at the request of Dr. Pryor Ochoa.     CHIEF COMPLAINT:   Follow up cough   HISTORY OF PRESENT ILLNESS   Patient prescribed prednisone from last visit and she feels much better, her symptoms returned after completing the prednisone Coughing and intermittent wheezing, increased SOB, DOE returned after prednisone stopped Patient has some hoarseness but better since last visit She would like daily prednisone   No signs of infection at this time     Current Medication:  Current Outpatient Prescriptions:  .  acetaminophen (TYLENOL) 325 MG tablet, Take 650 mg by mouth every 4 (four) hours as needed., Disp: , Rfl:  .  albuterol (PROVENTIL HFA;VENTOLIN HFA) 108 (90 Base) MCG/ACT inhaler, Inhale 2 puffs into the lungs every 4 (four) hours as needed for wheezing or shortness of breath., Disp: 1 Inhaler, Rfl: 2 .  apixaban (ELIQUIS) 2.5 MG TABS tablet, Take 2.5 mg by mouth 2 (two) times daily., Disp: , Rfl:  .  Artificial Tear Ointment (REFRESH P.M. OP), Apply to eye 2 (two) times daily., Disp: , Rfl:  .  aspirin EC 81 MG tablet, Take 81 mg by mouth., Disp: , Rfl:  .  atorvastatin (LIPITOR) 20 MG tablet, Take 20 mg by mouth daily., Disp: , Rfl:  .  benzonatate (TESSALON) 100 MG capsule, Take 100 mg by mouth every 8 (eight) hours as needed for cough., Disp: , Rfl:  .  bisacodyl (DULCOLAX) 10 MG suppository, Place 10 mg rectally as needed for moderate constipation., Disp: , Rfl:  .  calcium carbonate (OS-CAL - DOSED IN MG OF ELEMENTAL  CALCIUM) 1250 (500 CA) MG tablet, Take by mouth., Disp: , Rfl:  .  camphor-menthol (SARNA) lotion, Apply 1 application topically as needed for itching., Disp: , Rfl:  .  Carboxymethylcellulose Sodium (REFRESH TEARS OP), Apply to eye daily., Disp: , Rfl:  .  cetirizine (ZYRTEC) 5 MG tablet, Take 5 mg by mouth at bedtime as needed for allergies. Reported on 11/09/2015, Disp: , Rfl:  .  clonazePAM (KLONOPIN) 0.5 MG tablet, Take 0.5 mg by mouth every 12 (twelve) hours., Disp: , Rfl:  .  cyanocobalamin (,VITAMIN B-12,) 1000 MCG/ML injection, Inject into the muscle., Disp: , Rfl:  .  diltiazem (CARDIZEM) 60 MG tablet, Take 60 mg by mouth daily., Disp: , Rfl:  .  fluticasone (FLOVENT HFA) 110 MCG/ACT inhaler, Inhale into the lungs., Disp: , Rfl:  .  fluticasone-salmeterol (ADVAIR HFA) 230-21 MCG/ACT inhaler, Inhale 2 puffs into the lungs 2 (two) times daily., Disp: , Rfl:  .  furosemide (LASIX) 40 MG tablet, Take 60 mg by mouth., Disp: , Rfl:  .  Incontinence Supply Disposable ([employer] BLADDER CONTROL PAD) MISC, , Disp: , Rfl:  .  Melatonin 3 MG TABS, 6 mg., Disp: , Rfl:  .  omeprazole (PRILOSEC) 20 MG capsule, Take 20 mg by mouth daily., Disp: , Rfl:  .  potassium chloride SA (  K-DUR,KLOR-CON) 20 MEQ tablet, Take 40 mEq by mouth daily., Disp: , Rfl:  .  predniSONE (DELTASONE) 50 MG tablet, Take 1 tablet (50 mg total) by mouth daily with breakfast., Disp: 10 tablet, Rfl: 0 .  sennosides-docusate sodium (SENOKOT-S) 8.6-50 MG tablet, Take 1 tablet by mouth 2 (two) times daily., Disp: , Rfl:  .  sertraline (ZOLOFT) 50 MG tablet, Take 50 mg by mouth daily., Disp: , Rfl:  .  tiotropium (SPIRIVA) 18 MCG inhalation capsule, Place 18 mcg into inhaler and inhale daily., Disp: , Rfl:  .  triamcinolone cream (KENALOG) 0.1 %, Apply 1 application topically 2 (two) times daily., Disp: , Rfl:  .  Trimethoprim HCl 50 MG/5ML SOLN, 100 mg., Disp: , Rfl:  .  warfarin (COUMADIN) 4 MG tablet, Take 4 mg by mouth., Disp: , Rfl:      ALLERGIES   Iodinated diagnostic agents; Ciprofloxacin; Nitrofurantoin; Solifenacin; and Metronidazole     REVIEW OF SYSTEMS   Review of Systems  Constitutional: Negative for chills, diaphoresis, fever, malaise/fatigue and weight loss.  HENT: Negative for congestion and hearing loss.   Eyes: Negative for blurred vision and double vision.  Respiratory: Positive for cough, sputum production, shortness of breath and wheezing.   Gastrointestinal: Negative for heartburn and nausea.  Skin: Negative for rash.  Neurological: Positive for focal weakness. Negative for weakness and headaches.  All other systems reviewed and are negative.    VS: BP 132/62 (BP Location: Right Arm, Cuff Size: Normal)   Pulse 68   Wt 125 lb (56.7 kg)   SpO2 95%   BMI 23.62 kg/m     PHYSICAL EXAM  Physical Exam  Constitutional: She is oriented to person, place, and time. She appears well-developed and well-nourished. No distress.  HENT:  Mouth/Throat: No oropharyngeal exudate.  Neck: Neck supple.  Cardiovascular: Normal rate, regular rhythm and normal heart sounds.   No murmur heard. Pulmonary/Chest: No stridor. No respiratory distress. She has no wheezes. She has no rales.  Musculoskeletal: She exhibits no edema.  Neurological: She is alert and oriented to person, place, and time.  Skin: Skin is warm. She is not diaphoretic.  Psychiatric: She has a normal mood and affect.          ASSESSMENT/PLAN   82 yo white female with h/o COPD and h/o CVA with chronic cough with intermittent wheezing with productive cough Likely related chronic bronchitis  1.start prednisone 20 mg daily as requested by patient and family 2.albuterol as needed, continue advair 3.contiue ST/PT  Follow up in 3 weeks for interval changes    The Patient requires high complexity decision making for assessment and support, frequent evaluation and titration of therapies, application of advanced monitoring  technologies and extensive interpretation of multiple databases.   Patient/Family are satisfied with Plan of action and management. All questions answered  Corrin Parker, M.D.  [facility]  Medical Director [place] Director [facility] Cardio-Pulmonary Department

## 2015-12-24 NOTE — Patient Instructions (Addendum)
Prednisone 20 mg daily  Follow up in 3 months   Chronic Bronchitis Chronic bronchitis is a lasting inflammation of the bronchial tubes, which are the tubes that carry air into your lungs. This is inflammation that occurs:   On most days of the week.   For at least three months at a time.   Over a period of two years in a row. When the bronchial tubes are inflamed, they start to produce mucus. The inflammation and buildup of mucus make it more difficult to breathe. Chronic bronchitis is usually a permanent problem and is one type of chronic obstructive pulmonary disease (COPD). People with chronic bronchitis are at greater risk for getting repeated colds, or respiratory infections. CAUSES  Chronic bronchitis most often occurs in people who have:  Long-standing, severe asthma.  A history of smoking.  Asthma and who also smoke. SIGNS AND SYMPTOMS  Chronic bronchitis may cause the following:   A cough that brings up mucus (productive cough).  Shortness of breath.  Early morning headache.  Wheezing.  Chest discomfort.   Recurring respiratory infections. DIAGNOSIS  Your health care provider may confirm the diagnosis by:  Taking your medical history.  Performing a physical exam.  Taking a chest X-ray.   Performing pulmonary function tests. TREATMENT  Treatment involves controlling symptoms with medicines, oxygen therapy, or making lifestyle changes, such as exercising and eating a healthy, well-balanced diet. Medicines could include:  Inhalers to improve air flow in and out of your lungs.  Antibiotics to treat bacterial infections, such as pneumonia, sinus infections, and acute bronchitis. As a preventative measure, your health care provider may recommend routine vaccinations for influenza and pneumonia. This is to prevent infection and hospitalization since you may be more at risk for these types of infections.  HOME CARE INSTRUCTIONS  Take medicines only as  directed by your health care provider.   If you smoke cigarettes, chew tobacco, or use electronic cigarettes, quit. If you need help quitting, ask your health care provider.  Avoid pollen, dust, animal dander, molds, smoke, and other things that cause shortness of breath or wheezing attacks.  Talk to your health care provider about possible exercise routines. Regular exercise is very important to help you feel better.  If you are prescribed oxygen use at home follow these guidelines:  Never smoke while using oxygen. Oxygen does not burn or explode, but flammable materials will burn faster in the presence of oxygen.  Keep a Data processing manager close by. Let your fire department know that you have oxygen in your home.  Warn visitors not to smoke near you when you are using oxygen. Put up "no smoking" signs in your home where you most often use the oxygen.  Regularly test your smoke detectors at home to make sure they work. If you receive care in your home from a nurse or other health care provider, he or she may also check to make sure your smoke detectors work.  Ask your health care provider whether you would benefit from a pulmonary rehabilitation program.  Do not wait to get medical care if you have any concerning symptoms. Delays could cause permanent injury and may be life threatening. SEEK MEDICAL CARE IF:  You have increased coughing or shortness of breath or both.  You have muscle aches.  You have chest pain.  Your mucus gets thicker.  Your mucus changes from clear or white to yellow, green, gray, or bloody. SEEK IMMEDIATE MEDICAL CARE IF:  Your usual medicines  do not stop your wheezing.   You have increased difficulty breathing.   You have any problems with the medicine you are taking, such as a rash, itching, swelling, or trouble breathing. MAKE SURE YOU:   Understand these instructions.  Will watch your condition.  Will get help right away if you are not doing  well or get worse.   This information is not intended to replace advice given to you by your health care provider. Make sure you discuss any questions you have with your health care provider.   Document Released: 02/06/2006 Document Revised: 05/12/2014 Document Reviewed: 05/30/2013 Elsevier Interactive Patient Education [employer].

## 2015-12-25 DIAGNOSIS — M6281 Muscle weakness (generalized): Secondary | ICD-10-CM | POA: Diagnosis not present

## 2015-12-25 DIAGNOSIS — I69354 Hemiplegia and hemiparesis following cerebral infarction affecting left non-dominant side: Secondary | ICD-10-CM | POA: Diagnosis not present

## 2015-12-25 DIAGNOSIS — I480 Paroxysmal atrial fibrillation: Secondary | ICD-10-CM | POA: Diagnosis not present

## 2015-12-25 DIAGNOSIS — J45909 Unspecified asthma, uncomplicated: Secondary | ICD-10-CM | POA: Diagnosis not present

## 2015-12-25 DIAGNOSIS — I69321 Dysphasia following cerebral infarction: Secondary | ICD-10-CM | POA: Diagnosis not present

## 2015-12-25 DIAGNOSIS — I10 Essential (primary) hypertension: Secondary | ICD-10-CM | POA: Diagnosis not present

## 2015-12-26 DIAGNOSIS — M6281 Muscle weakness (generalized): Secondary | ICD-10-CM | POA: Diagnosis not present

## 2015-12-26 DIAGNOSIS — I10 Essential (primary) hypertension: Secondary | ICD-10-CM | POA: Diagnosis not present

## 2015-12-26 DIAGNOSIS — I480 Paroxysmal atrial fibrillation: Secondary | ICD-10-CM | POA: Diagnosis not present

## 2015-12-26 DIAGNOSIS — J45909 Unspecified asthma, uncomplicated: Secondary | ICD-10-CM | POA: Diagnosis not present

## 2015-12-26 DIAGNOSIS — I69354 Hemiplegia and hemiparesis following cerebral infarction affecting left non-dominant side: Secondary | ICD-10-CM | POA: Diagnosis not present

## 2015-12-26 DIAGNOSIS — I69321 Dysphasia following cerebral infarction: Secondary | ICD-10-CM | POA: Diagnosis not present

## 2015-12-28 DIAGNOSIS — I10 Essential (primary) hypertension: Secondary | ICD-10-CM | POA: Diagnosis not present

## 2015-12-28 DIAGNOSIS — J45909 Unspecified asthma, uncomplicated: Secondary | ICD-10-CM | POA: Diagnosis not present

## 2015-12-28 DIAGNOSIS — I480 Paroxysmal atrial fibrillation: Secondary | ICD-10-CM | POA: Diagnosis not present

## 2015-12-28 DIAGNOSIS — I69321 Dysphasia following cerebral infarction: Secondary | ICD-10-CM | POA: Diagnosis not present

## 2015-12-28 DIAGNOSIS — M6281 Muscle weakness (generalized): Secondary | ICD-10-CM | POA: Diagnosis not present

## 2015-12-28 DIAGNOSIS — I69354 Hemiplegia and hemiparesis following cerebral infarction affecting left non-dominant side: Secondary | ICD-10-CM | POA: Diagnosis not present

## 2015-12-31 DIAGNOSIS — I10 Essential (primary) hypertension: Secondary | ICD-10-CM | POA: Diagnosis not present

## 2015-12-31 DIAGNOSIS — I480 Paroxysmal atrial fibrillation: Secondary | ICD-10-CM | POA: Diagnosis not present

## 2015-12-31 DIAGNOSIS — M6281 Muscle weakness (generalized): Secondary | ICD-10-CM | POA: Diagnosis not present

## 2015-12-31 DIAGNOSIS — I69354 Hemiplegia and hemiparesis following cerebral infarction affecting left non-dominant side: Secondary | ICD-10-CM | POA: Diagnosis not present

## 2015-12-31 DIAGNOSIS — J45909 Unspecified asthma, uncomplicated: Secondary | ICD-10-CM | POA: Diagnosis not present

## 2015-12-31 DIAGNOSIS — I69321 Dysphasia following cerebral infarction: Secondary | ICD-10-CM | POA: Diagnosis not present

## 2016-01-01 DIAGNOSIS — J45909 Unspecified asthma, uncomplicated: Secondary | ICD-10-CM | POA: Diagnosis not present

## 2016-01-01 DIAGNOSIS — I480 Paroxysmal atrial fibrillation: Secondary | ICD-10-CM | POA: Diagnosis not present

## 2016-01-01 DIAGNOSIS — M6281 Muscle weakness (generalized): Secondary | ICD-10-CM | POA: Diagnosis not present

## 2016-01-01 DIAGNOSIS — I69321 Dysphasia following cerebral infarction: Secondary | ICD-10-CM | POA: Diagnosis not present

## 2016-01-01 DIAGNOSIS — I69354 Hemiplegia and hemiparesis following cerebral infarction affecting left non-dominant side: Secondary | ICD-10-CM | POA: Diagnosis not present

## 2016-01-01 DIAGNOSIS — I10 Essential (primary) hypertension: Secondary | ICD-10-CM | POA: Diagnosis not present

## 2016-01-03 DIAGNOSIS — I10 Essential (primary) hypertension: Secondary | ICD-10-CM | POA: Diagnosis not present

## 2016-01-03 DIAGNOSIS — I69354 Hemiplegia and hemiparesis following cerebral infarction affecting left non-dominant side: Secondary | ICD-10-CM | POA: Diagnosis not present

## 2016-01-03 DIAGNOSIS — I480 Paroxysmal atrial fibrillation: Secondary | ICD-10-CM | POA: Diagnosis not present

## 2016-01-03 DIAGNOSIS — M6281 Muscle weakness (generalized): Secondary | ICD-10-CM | POA: Diagnosis not present

## 2016-01-03 DIAGNOSIS — I69321 Dysphasia following cerebral infarction: Secondary | ICD-10-CM | POA: Diagnosis not present

## 2016-01-03 DIAGNOSIS — J45909 Unspecified asthma, uncomplicated: Secondary | ICD-10-CM | POA: Diagnosis not present

## 2016-01-04 DIAGNOSIS — I480 Paroxysmal atrial fibrillation: Secondary | ICD-10-CM | POA: Diagnosis not present

## 2016-01-04 DIAGNOSIS — M6281 Muscle weakness (generalized): Secondary | ICD-10-CM | POA: Diagnosis not present

## 2016-01-04 DIAGNOSIS — I10 Essential (primary) hypertension: Secondary | ICD-10-CM | POA: Diagnosis not present

## 2016-01-04 DIAGNOSIS — I69354 Hemiplegia and hemiparesis following cerebral infarction affecting left non-dominant side: Secondary | ICD-10-CM | POA: Diagnosis not present

## 2016-01-04 DIAGNOSIS — I69321 Dysphasia following cerebral infarction: Secondary | ICD-10-CM | POA: Diagnosis not present

## 2016-01-04 DIAGNOSIS — J45909 Unspecified asthma, uncomplicated: Secondary | ICD-10-CM | POA: Diagnosis not present

## 2016-01-07 ENCOUNTER — Encounter: Payer: Self-pay | Admitting: Emergency Medicine

## 2016-01-07 ENCOUNTER — Emergency Department
Admission: EM | Admit: 2016-01-07 | Discharge: 2016-01-07 | Disposition: A | Discharge status: Home / Self Care | Payer: Medicare Other | Attending: Emergency Medicine | Admitting: Emergency Medicine

## 2016-01-07 ENCOUNTER — Emergency Department: Payer: Medicare Other

## 2016-01-07 DIAGNOSIS — Z7982 Long term (current) use of aspirin: Secondary | ICD-10-CM | POA: Insufficient documentation

## 2016-01-07 DIAGNOSIS — Z791 Long term (current) use of non-steroidal anti-inflammatories (NSAID): Secondary | ICD-10-CM | POA: Insufficient documentation

## 2016-01-07 DIAGNOSIS — J441 Chronic obstructive pulmonary disease with (acute) exacerbation: Secondary | ICD-10-CM | POA: Diagnosis not present

## 2016-01-07 DIAGNOSIS — I10 Essential (primary) hypertension: Secondary | ICD-10-CM | POA: Diagnosis not present

## 2016-01-07 DIAGNOSIS — S32000A Wedge compression fracture of unspecified lumbar vertebra, initial encounter for closed fracture: Secondary | ICD-10-CM

## 2016-01-07 DIAGNOSIS — Z7951 Long term (current) use of inhaled steroids: Secondary | ICD-10-CM | POA: Diagnosis not present

## 2016-01-07 DIAGNOSIS — I480 Paroxysmal atrial fibrillation: Secondary | ICD-10-CM | POA: Diagnosis not present

## 2016-01-07 DIAGNOSIS — Y999 Unspecified external cause status: Secondary | ICD-10-CM | POA: Insufficient documentation

## 2016-01-07 DIAGNOSIS — Y9389 Activity, other specified: Secondary | ICD-10-CM | POA: Insufficient documentation

## 2016-01-07 DIAGNOSIS — Z87891 Personal history of nicotine dependence: Secondary | ICD-10-CM | POA: Diagnosis not present

## 2016-01-07 DIAGNOSIS — Z79899 Other long term (current) drug therapy: Secondary | ICD-10-CM | POA: Insufficient documentation

## 2016-01-07 DIAGNOSIS — I69354 Hemiplegia and hemiparesis following cerebral infarction affecting left non-dominant side: Secondary | ICD-10-CM | POA: Diagnosis not present

## 2016-01-07 DIAGNOSIS — Y92002 Bathroom of unspecified non-institutional (private) residence single-family (private) house as the place of occurrence of the external cause: Secondary | ICD-10-CM | POA: Diagnosis not present

## 2016-01-07 DIAGNOSIS — W1839XA Other fall on same level, initial encounter: Secondary | ICD-10-CM | POA: Insufficient documentation

## 2016-01-07 DIAGNOSIS — S32020A Wedge compression fracture of second lumbar vertebra, initial encounter for closed fracture: Secondary | ICD-10-CM | POA: Insufficient documentation

## 2016-01-07 DIAGNOSIS — M549 Dorsalgia, unspecified: Secondary | ICD-10-CM | POA: Diagnosis not present

## 2016-01-07 DIAGNOSIS — I69321 Dysphasia following cerebral infarction: Secondary | ICD-10-CM | POA: Diagnosis not present

## 2016-01-07 DIAGNOSIS — M6281 Muscle weakness (generalized): Secondary | ICD-10-CM | POA: Diagnosis not present

## 2016-01-07 DIAGNOSIS — J45909 Unspecified asthma, uncomplicated: Secondary | ICD-10-CM | POA: Diagnosis not present

## 2016-01-07 DIAGNOSIS — S3992XA Unspecified injury of lower back, initial encounter: Secondary | ICD-10-CM | POA: Diagnosis present

## 2016-01-07 MED ORDER — TRAMADOL HCL 50 MG PO TABS
50.0000 mg | ORAL_TABLET | ORAL | Status: AC
  Administered 2016-01-07: 50 mg via ORAL
  Filled 2016-01-07: qty 1

## 2016-01-07 MED ORDER — TRAMADOL HCL 50 MG PO TABS: 50.0000 mg | ORAL_TABLET | Freq: Four times a day (QID) | ORAL | 0 refills | Status: DC | PRN

## 2016-01-07 NOTE — Discharge Instructions (Signed)
°  Please follow up with your doctor as soon as possible regarding today's ED visit and your back pain.  Return to the ED for worsening back pain, fever, weakness or numbness of either leg, or if you develop either (1) an inability to urinate or have bowel movements, or (2) loss of your ability to control your bathroom functions (if you start having "accidents"), or if you develop other new symptoms that concern you. ° °

## 2016-01-07 NOTE — ED Triage Notes (Signed)
Pt presents to ED via EMS from [facility] c/o mid-back pain following fall on Saturday. Pt states she was sitting in bathroom, tried to reach for toilet paper and leaned too far and lost her balance, landing on her butt. Pain 5/10 at rest, exacerbated by movement. Pt requesting xray and pain medication.

## 2016-01-07 NOTE — ED Provider Notes (Signed)
[facility] Emergency Department Provider Note   ____________________________________________   First MD Initiated Contact with Patient 01/07/16 1504     (approximate)  I have reviewed the triage vital signs and the nursing notes.   HISTORY  Chief Complaint Back Pain    HPI Ana Schultz is a 82 y.o. female previous history of stroke with left-sided weakness.  Patient reports that Saturday she was using the bathroom, she reached forward and fell off the toilet seat "landing on my butt".  Since that time she's had discomfort when rolling from side to side as well low colostomy over her very low portion of her mid back and buttock. No bruising noted. No new weakness numbness or tingling.  Patient reports no other injury. Did not strike her head neck back. No abdominal pain or other concerns.  Not much discomfort when sitting still, mostly with attempts to move back and forth. No hip pain or leg pain.  Past Medical History:  Diagnosis Date  . Adenoma of rectum   . Adrenal adenoma   . Anemia   . Atrophic vaginitis   . COPD (chronic obstructive pulmonary disease) ([place])   . Diverticulosis   . Dysphagia   . Frequent UTI   . Hemiplegia and hemiparesis ([place])    following cerebral infarction affecting left non-dominant side  . Microscopic hematuria   . Nonrheumatic aortic valve insufficiency   . Osteoporosis   . Paroxysmal atrial fibrillation (H[facility])   . Renal cyst   . Stroke ([place])   . Urge incontinence     Patient Active Problem List   Diagnosis Date Noted  . COPD exacerbation ([place]) 11/09/2015  . Dysphagia, pharyngoesophageal phase 10/10/2014  . Aneurysm ([place]) 10/09/2014  . Airway hyperreactivity 10/09/2014  . Benign essential HTN 10/09/2014  . BP (high blood pressure) 10/09/2014  . OP (osteoporosis) 10/09/2014  . Addison anemia 10/09/2014  . Tobacco abuse, in remission 08/02/2014  . Failure respiratory ([place]) 07/12/2014  . AI (aortic  incompetence) 07/07/2014  . Aneurysm of ascending aorta ([place]) 07/07/2014    Past Surgical History:  Procedure Laterality Date  . ABDOMINAL HYSTERECTOMY    . AORTOILIAC BYPASS      Prior to Admission medications   Medication Sig Start Date End Date Taking? Authorizing Provider  acetaminophen (TYLENOL) 325 MG tablet Take 650 mg by mouth every 4 (four) hours as needed.    Historical Provider, MD  albuterol (PROVENTIL HFA;VENTOLIN HFA) 108 (90 Base) MCG/ACT inhaler Inhale 2 puffs into the lungs every 4 (four) hours as needed for wheezing or shortness of breath. 11/09/15   Flora Lipps, MD  apixaban (ELIQUIS) 2.5 MG TABS tablet Take 2.5 mg by mouth 2 (two) times daily.    Historical Provider, MD  Artificial Tear Ointment (REFRESH P.M. OP) Apply to eye 2 (two) times daily.    Historical Provider, MD  aspirin EC 81 MG tablet Take 81 mg by mouth.    Historical Provider, MD  atorvastatin (LIPITOR) 20 MG tablet Take 20 mg by mouth daily.    Historical Provider, MD  benzonatate (TESSALON) 100 MG capsule Take 100 mg by mouth every 8 (eight) hours as needed for cough.    Historical Provider, MD  bisacodyl (DULCOLAX) 10 MG suppository Place 10 mg rectally as needed for moderate constipation.    Historical Provider, MD  calcium carbonate (OS-CAL - DOSED IN MG OF ELEMENTAL CALCIUM) 1250 (500 CA) MG tablet Take by mouth.    Historical Provider, MD  camphor-menthol (SARNA) lotion Apply 1 application topically as needed for itching.    Historical Provider, MD  Carboxymethylcellulose Sodium (REFRESH TEARS OP) Apply to eye daily.    Historical Provider, MD  cetirizine (ZYRTEC) 5 MG tablet Take 5 mg by mouth at bedtime as needed for allergies. Reported on 11/09/2015    Historical Provider, MD  clonazePAM (KLONOPIN) 0.5 MG tablet Take 0.5 mg by mouth every 12 (twelve) hours.    Historical Provider, MD  cyanocobalamin (,VITAMIN B-12,) 1000 MCG/ML injection Inject into the muscle. 09/13/13   Historical Provider, MD    diltiazem (CARDIZEM) 60 MG tablet Take 60 mg by mouth daily.    Historical Provider, MD  fluticasone (FLOVENT HFA) 110 MCG/ACT inhaler Inhale into the lungs. 08/10/14 08/10/15  Historical Provider, MD  fluticasone-salmeterol (ADVAIR HFA) 230-21 MCG/ACT inhaler Inhale 2 puffs into the lungs 2 (two) times daily.    Historical Provider, MD  furosemide (LASIX) 40 MG tablet Take 60 mg by mouth.    Historical Provider, MD  Incontinence Supply Disposable ([employer] BLADDER CONTROL PAD) MISC  05/24/14   Historical Provider, MD  Melatonin 3 MG TABS 6 mg. 08/10/14   Historical Provider, MD  omeprazole (PRILOSEC) 20 MG capsule Take 20 mg by mouth daily.    Historical Provider, MD  potassium chloride SA (K-DUR,KLOR-CON) 20 MEQ tablet Take 40 mEq by mouth daily.    Historical Provider, MD  predniSONE (DELTASONE) 20 MG tablet Take 1 tablet (20 mg total) by mouth daily with breakfast. 12/24/15   Flora Lipps, MD  sennosides-docusate sodium (SENOKOT-S) 8.6-50 MG tablet Take 1 tablet by mouth 2 (two) times daily.    Historical Provider, MD  sertraline (ZOLOFT) 50 MG tablet Take 50 mg by mouth daily.    Historical Provider, MD  tiotropium (SPIRIVA) 18 MCG inhalation capsule Place 18 mcg into inhaler and inhale daily.    Historical Provider, MD  traMADol (ULTRAM) 50 MG tablet Take 1 tablet (50 mg total) by mouth every 6 (six) hours as needed for severe pain. 01/07/16   Delman Kitten, MD  triamcinolone cream (KENALOG) 0.1 % Apply 1 application topically 2 (two) times daily.    Historical Provider, MD  Trimethoprim HCl 50 MG/5ML SOLN 100 mg. 08/10/14   Historical Provider, MD    Allergies Iodinated diagnostic agents; Ciprofloxacin; Nitrofurantoin; Solifenacin; and Metronidazole  Family History  Problem Relation Age of Onset  . Lung cancer Son     Social History Social History  Substance Use Topics  . Smoking status: Former Research scientist (life sciences)  . Smokeless tobacco: Never Used  . Alcohol use No    Review of Systems Constitutional: No  fever/chills Eyes: No visual changes. ENT: No sore throat. Cardiovascular: Denies chest pain. Respiratory: Denies shortness of breath. Gastrointestinal: No abdominal pain.  No nausea, no vomiting.  No diarrhea.  No constipation. Genitourinary: Negative for dysuria. Musculoskeletal: See history of present illness Skin: Negative for rash. Neurological: Negative for headaches, focal weakness or numbness.  10-point ROS otherwise negative.  ____________________________________________   PHYSICAL EXAM:  VITAL SIGNS: ED Triage Vitals  Enc Vitals Group     BP 01/07/16 1413 (!) 167/66     Pulse Rate 01/07/16 1413 71     Resp 01/07/16 1413 18     Temp 01/07/16 1413 97.9 F (36.6 C)     Temp Source 01/07/16 1413 Oral     SpO2 01/07/16 1413 97 %     Weight 01/07/16 1413 125 lb (56.7 kg)     Height 01/07/16  1413 5\' 1"  (1.549 m)     Head Circumference --      Peak Flow --      Pain Score 01/07/16 1414 5     Pain Loc --      Pain Edu? --      Excl. in [place]? --     Constitutional: Alert and oriented. Well appearing and in no acute distress. Eyes: Conjunctivae are normal. PERRL. EOMI. Head: Atraumatic. Nose: No congestion/rhinnorhea. Mouth/Throat: Mucous membranes are moist.  Oropharynx non-erythematous. Neck: No stridor.   Cardiovascular: Irregular, normal rate Grossly normal heart sounds.  Good peripheral circulation. Respiratory: Normal respiratory effort.  No retractions. Lungs CTAB. Gastrointestinal: Soft and nontender. No distention. No abdominal bruits. No CVA tenderness. Musculoskeletal:  Severe weakness and some contractures in the left upper extremity, chronic. Right upper extremity normal range of motion and normal strength.   No lower extremity tenderness nor edema.  No joint effusions. No cervical spine or thoracic tenderness. No midline or upper lumbar tenderness. There is minimal tenderness elicited with palpation of the lower lumbar spine. Patient is also tender over  both buttocks, reports same discomfort she is been experiencing. No hematoma ecchymosis or deformity noted.  Lower Extremities  No edema. Normal DP/PT pulses bilateral with good cap refill.  Normal neuro-motor function lower extremities bilateral.  RIGHT Right lower extremity demonstrates normal strength, good use of all muscles. No edema bruising or contusions of the right hip, right knee, right ankle. Full range of motion of the right lower extremity without pain. No pain on axial loading. No evidence of trauma.  LEFT Left lower extremity demonstrates mild decrease strength, good use of all muscles. No edema bruising or contusions of the hip,  knee, ankle. Full range of motion of the left lower extremity without pain. No pain on axial loading. No evidence of trauma.   Neurologic:  Normal speech and language. No gross focal neurologic deficits are appreciated. No gait instability. Skin:  Skin is warm, dry and intact. No rash noted. Psychiatric: Mood and affect are normal. Speech and behavior are normal.  ____________________________________________   LABS (all labs ordered are listed, but only abnormal results are displayed)  Labs Reviewed - No data to display ____________________________________________  EKG   ____________________________________________  RADIOLOGY  Dg Lumbar Spine 2-3 Views  Result Date: 01/07/2016 CLINICAL DATA:  Pt to ED c/o lower back pain after fall last Saturday, denies radicular pain, denies previous injury. EXAM: LUMBAR SPINE - 2-3 VIEW COMPARISON:  CT of the abdomen and pelvis 09/21/2013 FINDINGS: Interval wedge compression fracture of L2 with anterior loss of height by approximately 15%. No plain film evidence for retropulsion of fracture fragments. It is possible that this fracture is remote but it is new since May of 2015. No other fractures are identified. There is dense atherosclerotic calcification of the abdominal aorta. IMPRESSION: Interval wedge  compression fracture of L2. Consider CT evaluation as needed to assess acuity of this fracture. Electronically Signed   By: Nolon Nations M.D.   On: 01/07/2016 16:26    ____________________________________________   PROCEDURES  Procedure(s) performed: None  Procedures  Critical Care performed: No  ____________________________________________   INITIAL IMPRESSION / ASSESSMENT AND PLAN / ED COURSE  Pertinent labs & imaging results that were available during my care of the patient were reviewed by me and considered in my medical decision making (see chart for details).  Patient presents for mechanical fall with tenderness over the buttock since Saturday. She reports she is  here to get something to help with the pain, Tylenol helped not helping. She requests same medication Dr. Lovie Macadamia and put her on about a month ago for muscle aches after rehabilitation.  Patient denies any other concerns. Per mechanical fall which she reports. Plan to obtain a lumbar x-ray pain control tramadol. Neurovascular intact. Long-standing left-sided neurodeficits not new.  Clinical Course   ----------------------------------------- 5:30 PM on 01/07/2016 -----------------------------------------  Patient reports pain much better, carefully reviewed x-ray result. She reports she's never had back pain last few years, and it may be that she has a small acute lumbar compression fracture. Neurovascularly intact, less than 50% with no evidence for retropulsion. Discussed with the patient and family, we'll discharge with prescription for tramadol and careful follow-up and close return precautions discussed which patient and/or both agreeable with.  ____________________________________________   FINAL CLINICAL IMPRESSION(S) / ED DIAGNOSES  Final diagnoses:  Lumbar compression fracture, closed, initial encounter ([place])      NEW MEDICATIONS STARTED DURING THIS VISIT:  New Prescriptions   TRAMADOL  (ULTRAM) 50 MG TABLET    Take 1 tablet (50 mg total) by mouth every 6 (six) hours as needed for severe pain.     Note:  This document was prepared using Dragon voice recognition software and may include unintentional dictation errors.     Delman Kitten, MD 01/07/16 [phone]

## 2016-01-07 NOTE — ED Notes (Signed)

## 2016-01-08 DIAGNOSIS — G510 Bell's palsy: Secondary | ICD-10-CM | POA: Diagnosis not present

## 2016-01-09 DIAGNOSIS — I69354 Hemiplegia and hemiparesis following cerebral infarction affecting left non-dominant side: Secondary | ICD-10-CM | POA: Diagnosis not present

## 2016-01-09 DIAGNOSIS — I69321 Dysphasia following cerebral infarction: Secondary | ICD-10-CM | POA: Diagnosis not present

## 2016-01-09 DIAGNOSIS — I480 Paroxysmal atrial fibrillation: Secondary | ICD-10-CM | POA: Diagnosis not present

## 2016-01-09 DIAGNOSIS — M6281 Muscle weakness (generalized): Secondary | ICD-10-CM | POA: Diagnosis not present

## 2016-01-09 DIAGNOSIS — I10 Essential (primary) hypertension: Secondary | ICD-10-CM | POA: Diagnosis not present

## 2016-01-09 DIAGNOSIS — J45909 Unspecified asthma, uncomplicated: Secondary | ICD-10-CM | POA: Diagnosis not present

## 2016-01-11 DIAGNOSIS — M6281 Muscle weakness (generalized): Secondary | ICD-10-CM | POA: Diagnosis not present

## 2016-01-11 DIAGNOSIS — J45909 Unspecified asthma, uncomplicated: Secondary | ICD-10-CM | POA: Diagnosis not present

## 2016-01-11 DIAGNOSIS — I69321 Dysphasia following cerebral infarction: Secondary | ICD-10-CM | POA: Diagnosis not present

## 2016-01-11 DIAGNOSIS — I10 Essential (primary) hypertension: Secondary | ICD-10-CM | POA: Diagnosis not present

## 2016-01-11 DIAGNOSIS — I69354 Hemiplegia and hemiparesis following cerebral infarction affecting left non-dominant side: Secondary | ICD-10-CM | POA: Diagnosis not present

## 2016-01-11 DIAGNOSIS — I480 Paroxysmal atrial fibrillation: Secondary | ICD-10-CM | POA: Diagnosis not present

## 2016-01-14 DIAGNOSIS — J45909 Unspecified asthma, uncomplicated: Secondary | ICD-10-CM | POA: Diagnosis not present

## 2016-01-14 DIAGNOSIS — M6281 Muscle weakness (generalized): Secondary | ICD-10-CM | POA: Diagnosis not present

## 2016-01-14 DIAGNOSIS — I69354 Hemiplegia and hemiparesis following cerebral infarction affecting left non-dominant side: Secondary | ICD-10-CM | POA: Diagnosis not present

## 2016-01-14 DIAGNOSIS — I10 Essential (primary) hypertension: Secondary | ICD-10-CM | POA: Diagnosis not present

## 2016-01-14 DIAGNOSIS — I69321 Dysphasia following cerebral infarction: Secondary | ICD-10-CM | POA: Diagnosis not present

## 2016-01-14 DIAGNOSIS — I480 Paroxysmal atrial fibrillation: Secondary | ICD-10-CM | POA: Diagnosis not present

## 2016-01-15 DIAGNOSIS — I480 Paroxysmal atrial fibrillation: Secondary | ICD-10-CM | POA: Diagnosis not present

## 2016-01-15 DIAGNOSIS — I10 Essential (primary) hypertension: Secondary | ICD-10-CM | POA: Diagnosis not present

## 2016-01-15 DIAGNOSIS — I69321 Dysphasia following cerebral infarction: Secondary | ICD-10-CM | POA: Diagnosis not present

## 2016-01-15 DIAGNOSIS — I69354 Hemiplegia and hemiparesis following cerebral infarction affecting left non-dominant side: Secondary | ICD-10-CM | POA: Diagnosis not present

## 2016-01-15 DIAGNOSIS — J45909 Unspecified asthma, uncomplicated: Secondary | ICD-10-CM | POA: Diagnosis not present

## 2016-01-15 DIAGNOSIS — M6281 Muscle weakness (generalized): Secondary | ICD-10-CM | POA: Diagnosis not present

## 2016-01-16 DIAGNOSIS — I10 Essential (primary) hypertension: Secondary | ICD-10-CM | POA: Diagnosis not present

## 2016-01-16 DIAGNOSIS — I480 Paroxysmal atrial fibrillation: Secondary | ICD-10-CM | POA: Diagnosis not present

## 2016-01-16 DIAGNOSIS — M6281 Muscle weakness (generalized): Secondary | ICD-10-CM | POA: Diagnosis not present

## 2016-01-16 DIAGNOSIS — J45909 Unspecified asthma, uncomplicated: Secondary | ICD-10-CM | POA: Diagnosis not present

## 2016-01-16 DIAGNOSIS — I69321 Dysphasia following cerebral infarction: Secondary | ICD-10-CM | POA: Diagnosis not present

## 2016-01-16 DIAGNOSIS — I69354 Hemiplegia and hemiparesis following cerebral infarction affecting left non-dominant side: Secondary | ICD-10-CM | POA: Diagnosis not present

## 2016-01-17 DIAGNOSIS — J45909 Unspecified asthma, uncomplicated: Secondary | ICD-10-CM | POA: Diagnosis not present

## 2016-01-17 DIAGNOSIS — I480 Paroxysmal atrial fibrillation: Secondary | ICD-10-CM | POA: Diagnosis not present

## 2016-01-17 DIAGNOSIS — I69354 Hemiplegia and hemiparesis following cerebral infarction affecting left non-dominant side: Secondary | ICD-10-CM | POA: Diagnosis not present

## 2016-01-17 DIAGNOSIS — I69321 Dysphasia following cerebral infarction: Secondary | ICD-10-CM | POA: Diagnosis not present

## 2016-01-17 DIAGNOSIS — I10 Essential (primary) hypertension: Secondary | ICD-10-CM | POA: Diagnosis not present

## 2016-01-17 DIAGNOSIS — M6281 Muscle weakness (generalized): Secondary | ICD-10-CM | POA: Diagnosis not present

## 2016-01-19 DIAGNOSIS — M25562 Pain in left knee: Secondary | ICD-10-CM | POA: Diagnosis not present

## 2016-01-21 DIAGNOSIS — R2681 Unsteadiness on feet: Secondary | ICD-10-CM | POA: Diagnosis not present

## 2016-01-21 DIAGNOSIS — L600 Ingrowing nail: Secondary | ICD-10-CM | POA: Diagnosis not present

## 2016-01-21 DIAGNOSIS — M79673 Pain in unspecified foot: Secondary | ICD-10-CM | POA: Diagnosis not present

## 2016-01-21 DIAGNOSIS — B351 Tinea unguium: Secondary | ICD-10-CM | POA: Diagnosis not present

## 2016-01-22 DIAGNOSIS — K5900 Constipation, unspecified: Secondary | ICD-10-CM | POA: Diagnosis not present

## 2016-01-22 DIAGNOSIS — Z9181 History of falling: Secondary | ICD-10-CM | POA: Diagnosis not present

## 2016-01-22 DIAGNOSIS — J45909 Unspecified asthma, uncomplicated: Secondary | ICD-10-CM | POA: Diagnosis not present

## 2016-01-22 DIAGNOSIS — I480 Paroxysmal atrial fibrillation: Secondary | ICD-10-CM | POA: Diagnosis not present

## 2016-01-22 DIAGNOSIS — Z7901 Long term (current) use of anticoagulants: Secondary | ICD-10-CM | POA: Diagnosis not present

## 2016-01-22 DIAGNOSIS — I10 Essential (primary) hypertension: Secondary | ICD-10-CM | POA: Diagnosis not present

## 2016-01-22 DIAGNOSIS — I69354 Hemiplegia and hemiparesis following cerebral infarction affecting left non-dominant side: Secondary | ICD-10-CM | POA: Diagnosis not present

## 2016-01-22 DIAGNOSIS — I69321 Dysphasia following cerebral infarction: Secondary | ICD-10-CM | POA: Diagnosis not present

## 2016-01-22 DIAGNOSIS — I351 Nonrheumatic aortic (valve) insufficiency: Secondary | ICD-10-CM | POA: Diagnosis not present

## 2016-01-22 DIAGNOSIS — M25562 Pain in left knee: Secondary | ICD-10-CM | POA: Diagnosis not present

## 2016-01-22 DIAGNOSIS — Z7982 Long term (current) use of aspirin: Secondary | ICD-10-CM | POA: Diagnosis not present

## 2016-01-22 DIAGNOSIS — F419 Anxiety disorder, unspecified: Secondary | ICD-10-CM | POA: Diagnosis not present

## 2016-01-24 DIAGNOSIS — J45909 Unspecified asthma, uncomplicated: Secondary | ICD-10-CM | POA: Diagnosis not present

## 2016-01-24 DIAGNOSIS — I10 Essential (primary) hypertension: Secondary | ICD-10-CM | POA: Diagnosis not present

## 2016-01-24 DIAGNOSIS — M25562 Pain in left knee: Secondary | ICD-10-CM | POA: Diagnosis not present

## 2016-01-24 DIAGNOSIS — K5900 Constipation, unspecified: Secondary | ICD-10-CM | POA: Diagnosis not present

## 2016-01-24 DIAGNOSIS — I69321 Dysphasia following cerebral infarction: Secondary | ICD-10-CM | POA: Diagnosis not present

## 2016-01-24 DIAGNOSIS — I69354 Hemiplegia and hemiparesis following cerebral infarction affecting left non-dominant side: Secondary | ICD-10-CM | POA: Diagnosis not present

## 2016-01-26 ENCOUNTER — Emergency Department: Payer: Medicare Other

## 2016-01-26 ENCOUNTER — Emergency Department
Admission: EM | Admit: 2016-01-26 | Discharge: 2016-01-26 | Disposition: A | Discharge status: Home / Self Care | Payer: Medicare Other | Attending: Emergency Medicine | Admitting: Emergency Medicine

## 2016-01-26 DIAGNOSIS — J449 Chronic obstructive pulmonary disease, unspecified: Secondary | ICD-10-CM | POA: Insufficient documentation

## 2016-01-26 DIAGNOSIS — M25551 Pain in right hip: Secondary | ICD-10-CM | POA: Diagnosis not present

## 2016-01-26 DIAGNOSIS — S3992XA Unspecified injury of lower back, initial encounter: Secondary | ICD-10-CM | POA: Diagnosis not present

## 2016-01-26 DIAGNOSIS — R531 Weakness: Secondary | ICD-10-CM | POA: Diagnosis not present

## 2016-01-26 DIAGNOSIS — Z8673 Personal history of transient ischemic attack (TIA), and cerebral infarction without residual deficits: Secondary | ICD-10-CM | POA: Diagnosis not present

## 2016-01-26 DIAGNOSIS — M25562 Pain in left knee: Secondary | ICD-10-CM | POA: Diagnosis not present

## 2016-01-26 DIAGNOSIS — S9002XA Contusion of left ankle, initial encounter: Secondary | ICD-10-CM | POA: Diagnosis not present

## 2016-01-26 DIAGNOSIS — Z87891 Personal history of nicotine dependence: Secondary | ICD-10-CM | POA: Diagnosis not present

## 2016-01-26 DIAGNOSIS — M549 Dorsalgia, unspecified: Secondary | ICD-10-CM | POA: Diagnosis present

## 2016-01-26 DIAGNOSIS — M25552 Pain in left hip: Secondary | ICD-10-CM | POA: Diagnosis not present

## 2016-01-26 DIAGNOSIS — Z79899 Other long term (current) drug therapy: Secondary | ICD-10-CM | POA: Insufficient documentation

## 2016-01-26 DIAGNOSIS — S8002XA Contusion of left knee, initial encounter: Secondary | ICD-10-CM | POA: Diagnosis not present

## 2016-01-26 DIAGNOSIS — S3993XA Unspecified injury of pelvis, initial encounter: Secondary | ICD-10-CM | POA: Diagnosis not present

## 2016-01-26 DIAGNOSIS — R52 Pain, unspecified: Secondary | ICD-10-CM | POA: Diagnosis not present

## 2016-01-26 LAB — BASIC METABOLIC PANEL
ANION GAP: 7 (ref 5–15)
ANION GAP: 6 (ref 5–15)
BUN: 29 mg/dL — ABNORMAL HIGH (ref 6–20)
BUN: 27 mg/dL — ABNORMAL HIGH (ref 6–20)
CO2: 30 mmol/L (ref 22–32)
CO2: 27 mmol/L (ref 22–32)
Calcium: 8.9 mg/dL (ref 8.9–10.3)
Calcium: 8.1 mg/dL — ABNORMAL LOW (ref 8.9–10.3)
Chloride: 100 mmol/L — ABNORMAL LOW (ref 101–111)
Chloride: 104 mmol/L (ref 101–111)
Creatinine, Ser: 1.47 mg/dL — ABNORMAL HIGH (ref 0.44–1.00)
Creatinine, Ser: 1.29 mg/dL — ABNORMAL HIGH (ref 0.44–1.00)
GFR calc Af Amer: 36 mL/min — ABNORMAL LOW (ref >60)
GFR calc Af Amer: 42 mL/min — ABNORMAL LOW (ref >60)
GFR, EST NON AFRICAN AMERICAN: 31 mL/min — AB (ref >60)
GFR, EST NON AFRICAN AMERICAN: 36 mL/min — AB (ref >60)
GLUCOSE: 137 mg/dL — AB (ref 65–99)
GLUCOSE: 153 mg/dL — AB (ref 65–99)
POTASSIUM: 4.3 mmol/L (ref 3.5–5.1)
POTASSIUM: 3.9 mmol/L (ref 3.5–5.1)
Sodium: 137 mmol/L (ref 135–145)
Sodium: 137 mmol/L (ref 135–145)

## 2016-01-26 LAB — URINALYSIS COMPLETE WITH MICROSCOPIC (ARMC ONLY)
Bilirubin Urine: NEGATIVE
Glucose, UA: NEGATIVE mg/dL
Hgb urine dipstick: NEGATIVE
Ketones, ur: NEGATIVE mg/dL
Leukocytes, UA: NEGATIVE
Nitrite: NEGATIVE
PROTEIN: NEGATIVE mg/dL
Specific Gravity, Urine: 1.013 (ref 1.005–1.030)
pH: 6 (ref 5.0–8.0)

## 2016-01-26 LAB — CBC WITH DIFFERENTIAL/PLATELET
BASOS ABS: 0 10*3/uL (ref 0–0.1)
Basophils Relative: 0 %
Eosinophils Absolute: 0 10*3/uL (ref 0–0.7)
Eosinophils Relative: 0 %
HEMATOCRIT: 32.9 % — AB (ref 35.0–47.0)
HEMOGLOBIN: 11.2 g/dL — AB (ref 12.0–16.0)
LYMPHS PCT: 7 %
Lymphs Abs: 1 10*3/uL (ref 1.0–3.6)
MCH: 28.9 pg (ref 26.0–34.0)
MCHC: 34.1 g/dL (ref 32.0–36.0)
MCV: 84.6 fL (ref 80.0–100.0)
Monocytes Absolute: 0.5 10*3/uL (ref 0.2–0.9)
Monocytes Relative: 3 %
NEUTROS ABS: 12.5 10*3/uL — AB (ref 1.4–6.5)
NEUTROS PCT: 90 %
PLATELETS: 259 10*3/uL (ref 150–440)
RBC: 3.88 MIL/uL (ref 3.80–5.20)
RDW: 17.4 % — ABNORMAL HIGH (ref 11.5–14.5)
WBC: 14 10*3/uL — AB (ref 3.6–11.0)

## 2016-01-26 MED ORDER — SENNA 8.6 MG PO TABS: 1.0000 | ORAL_TABLET | Freq: Every day | ORAL | 2 refills | Status: DC

## 2016-01-26 MED ORDER — SODIUM CHLORIDE 0.9 % IV BOLUS (SEPSIS)
1000.0000 mL | Freq: Once | INTRAVENOUS | Status: AC
  Administered 2016-01-26: 1000 mL via INTRAVENOUS

## 2016-01-26 MED ORDER — OXYCODONE HCL 5 MG PO TABS: 5.0000 mg | ORAL_TABLET | Freq: Three times a day (TID) | ORAL | 0 refills | Status: DC | PRN

## 2016-01-26 MED ORDER — OXYCODONE HCL 5 MG PO TABS
5.0000 mg | ORAL_TABLET | Freq: Once | ORAL | Status: AC
Start: 2016-01-26 — End: 2016-01-26
  Administered 2016-01-26: 5 mg via ORAL
  Filled 2016-01-26: qty 1

## 2016-01-26 NOTE — Discharge Instructions (Signed)
Pain control: Take tylenol 1000mg  every 8 hours. Take 5mg  of oxycodone every 6 hours for breakthrough pain. If you need the oxycodone make sure to take one senokot as well to prevent constipation.  Make sure to drink plenty of fluids to prevent dehydration.  Do not drink alcohol, drive or participate in any other potentially dangerous activities while taking this medication as it may make you sleepy. Do not take this medication with any other sedating medications, either prescription or over-the-counter.

## 2016-01-26 NOTE — ED Triage Notes (Signed)
Pt came to ED via EMS from the [facility]. Pt fell x2 weeks ago. Diagnosed with lumbar puncture. Has been taking tylenol and tramadol with no relief. Pt c/o pain in back and hips.

## 2016-01-26 NOTE — ED Provider Notes (Signed)
[facility] Emergency Department Provider Note  ____________________________________________  Time seen: Approximately 12:49 PM  I have reviewed the triage vital signs and the nursing notes.   HISTORY  Chief Complaint Back Pain and Hip Pain   HPI Ana Schultz is a 82 y.o. female history of prior stroke with left-sided weakness, recent fall with L2 compression fracture 19 days ago who presents for evaluation of hip and knee pain. According to patient's rehabilitation she hasn't had another fall however a week ago she was working with PT and her left leg gave out. Patient was held by PT personnel with no fall however patient noticed swelling and bruising of her left ankle. She reports that she had pain a few days following that incident on her ankle however has had no pain in the ankle anymore. She is complaining of pain and cramping in her left knee and also reports pain in her bilateral hips. She reports that since that incident a week ago patient has only ambulated to and from the bathroom and that's when her pain becomes severe. She is still able to ambulate. She has been taking Tylenol and tramadol and received her dose this morning. She reports that the medications help with her pain however she feels like she is not healing. She reports that when she lays in bed at rest she has no pain and denies pain at this time. She denies back pain, saddle anesthesia, urinary or bowel incontinence or retention. She denies abdominal pain, chest pain, shortness of breath, dysuria, fever.  Past Medical History:  Diagnosis Date  . Adenoma of rectum   . Adrenal adenoma   . Anemia   . Atrophic vaginitis   . COPD (chronic obstructive pulmonary disease) ([place])   . Diverticulosis   . Dysphagia   . Frequent UTI   . Hemiplegia and hemiparesis ([place])    following cerebral infarction affecting left non-dominant side  . Microscopic hematuria   . Nonrheumatic aortic valve  insufficiency   . Osteoporosis   . Paroxysmal atrial fibrillation (HCC)   . Renal cyst   . Stroke ([place])   . Urge incontinence     Patient Active Problem List   Diagnosis Date Noted  . COPD exacerbation ([place]) 11/09/2015  . Dysphagia, pharyngoesophageal phase 10/10/2014  . Aneurysm ([place]) 10/09/2014  . Airway hyperreactivity 10/09/2014  . Benign essential HTN 10/09/2014  . BP (high blood pressure) 10/09/2014  . OP (osteoporosis) 10/09/2014  . Addison anemia 10/09/2014  . Tobacco abuse, in remission 08/02/2014  . Failure respiratory ([place]) 07/12/2014  . AI (aortic incompetence) 07/07/2014  . Aneurysm of ascending aorta ([place]) 07/07/2014    Past Surgical History:  Procedure Laterality Date  . ABDOMINAL HYSTERECTOMY    . AORTOILIAC BYPASS      Prior to Admission medications   Medication Sig Start Date End Date Taking? Authorizing Provider  acetaminophen (TYLENOL) 325 MG tablet Take 650 mg by mouth every 4 (four) hours as needed.    Historical Provider, MD  albuterol (PROVENTIL HFA;VENTOLIN HFA) 108 (90 Base) MCG/ACT inhaler Inhale 2 puffs into the lungs every 4 (four) hours as needed for wheezing or shortness of breath. 11/09/15   Flora Lipps, MD  apixaban (ELIQUIS) 2.5 MG TABS tablet Take 2.5 mg by mouth 2 (two) times daily.    Historical Provider, MD  Artificial Tear Ointment (REFRESH P.M. OP) Apply to eye 2 (two) times daily.    Historical Provider, MD  aspirin EC 81 MG tablet Take  81 mg by mouth.    Historical Provider, MD  atorvastatin (LIPITOR) 20 MG tablet Take 20 mg by mouth daily.    Historical Provider, MD  benzonatate (TESSALON) 100 MG capsule Take 100 mg by mouth every 8 (eight) hours as needed for cough.    Historical Provider, MD  bisacodyl (DULCOLAX) 10 MG suppository Place 10 mg rectally as needed for moderate constipation.    Historical Provider, MD  calcium carbonate (OS-CAL - DOSED IN MG OF ELEMENTAL CALCIUM) 1250 (500 CA) MG tablet Take by mouth.    Historical  Provider, MD  camphor-menthol Timoteo Ace) lotion Apply 1 application topically as needed for itching.    Historical Provider, MD  Carboxymethylcellulose Sodium (REFRESH TEARS OP) Apply to eye daily.    Historical Provider, MD  cetirizine (ZYRTEC) 5 MG tablet Take 5 mg by mouth at bedtime as needed for allergies. Reported on 11/09/2015    Historical Provider, MD  clonazePAM (KLONOPIN) 0.5 MG tablet Take 0.5 mg by mouth every 12 (twelve) hours.    Historical Provider, MD  cyanocobalamin (,VITAMIN B-12,) 1000 MCG/ML injection Inject into the muscle. 09/13/13   Historical Provider, MD  diltiazem (CARDIZEM) 60 MG tablet Take 60 mg by mouth daily.    Historical Provider, MD  fluticasone (FLOVENT HFA) 110 MCG/ACT inhaler Inhale into the lungs. 08/10/14 08/10/15  Historical Provider, MD  fluticasone-salmeterol (ADVAIR HFA) 230-21 MCG/ACT inhaler Inhale 2 puffs into the lungs 2 (two) times daily.    Historical Provider, MD  furosemide (LASIX) 40 MG tablet Take 60 mg by mouth.    Historical Provider, MD  Incontinence Supply Disposable ([employer] BLADDER CONTROL PAD) MISC  05/24/14   Historical Provider, MD  Melatonin 3 MG TABS 6 mg. 08/10/14   Historical Provider, MD  omeprazole (PRILOSEC) 20 MG capsule Take 20 mg by mouth daily.    Historical Provider, MD  oxyCODONE (ROXICODONE) 5 MG immediate release tablet Take 1 tablet (5 mg total) by mouth every 8 (eight) hours as needed. 01/26/16 01/25/17  Rudene Re, MD  potassium chloride SA (K-DUR,KLOR-CON) 20 MEQ tablet Take 40 mEq by mouth daily.    Historical Provider, MD  predniSONE (DELTASONE) 20 MG tablet Take 1 tablet (20 mg total) by mouth daily with breakfast. 12/24/15   Flora Lipps, MD  senna (SENOKOT) 8.6 MG TABS tablet Take 1 tablet (8.6 mg total) by mouth daily. 01/26/16   Rudene Re, MD  sennosides-docusate sodium (SENOKOT-S) 8.6-50 MG tablet Take 1 tablet by mouth 2 (two) times daily.    Historical Provider, MD  sertraline (ZOLOFT) 50 MG tablet Take 50 mg by  mouth daily.    Historical Provider, MD  tiotropium (SPIRIVA) 18 MCG inhalation capsule Place 18 mcg into inhaler and inhale daily.    Historical Provider, MD  triamcinolone cream (KENALOG) 0.1 % Apply 1 application topically 2 (two) times daily.    Historical Provider, MD  Trimethoprim HCl 50 MG/5ML SOLN 100 mg. 08/10/14   Historical Provider, MD    Allergies Iodinated diagnostic agents; Ciprofloxacin; Nitrofurantoin; Solifenacin; and Metronidazole  Family History  Problem Relation Age of Onset  . Lung cancer Son     Social History Social History  Substance Use Topics  . Smoking status: Former Research scientist (life sciences)  . Smokeless tobacco: Never Used  . Alcohol use No    Review of Systems  Constitutional: Negative for fever. Eyes: Negative for visual changes. ENT: Negative for sore throat. Cardiovascular: Negative for chest pain. Respiratory: Negative for shortness of breath. Gastrointestinal: Negative for abdominal  pain, vomiting or diarrhea. Genitourinary: Negative for dysuria. Musculoskeletal: Negative for back pain. + L knee pain and b/l hip pain Skin: Negative for rash. Neurological: Negative for headaches, weakness or numbness.  ____________________________________________   PHYSICAL EXAM:  VITAL SIGNS: ED Triage Vitals [01/26/16 1200]  Enc Vitals Group     BP (!) 134/46     Pulse Rate 68     Resp 14     Temp 98.2 F (36.8 C)     Temp Source Oral     SpO2 100 %     Weight      Height      Head Circumference      Peak Flow      Pain Score      Pain Loc      Pain Edu?      Excl. in [place]?     Constitutional: Alert and oriented. Well appearing and in no apparent distress. HEENT:      Head: Normocephalic and atraumatic.         Eyes: Conjunctivae are normal. Sclera is non-icteric. EOMI. PERRL      Mouth/Throat: Mucous membranes are moist.       Neck: Supple with no signs of meningismus. Cardiovascular: Regular rate and rhythm. No murmurs, gallops, or rubs. 2+ symmetrical  distal pulses are present in all extremities. No JVD. Respiratory: Normal respiratory effort. Lungs are clear to auscultation bilaterally. No wheezes, crackles, or rhonchi.  Gastrointestinal: Soft, non tender, and non distended with positive bowel sounds. No rebound or guarding. Musculoskeletal: Patient has no CT and L-spine tenderness, no posterior pelvis tenderness or bruises. Full painless range of motion of bilateral hips, she does have tenderness to palpation on the medial aspect of her left knee with no bruising, patient has ecchymosis in the medial and lateral aspect of her left ankle with no tenderness and full painless range of motion. Neurologic: Normal speech and language. Face is symmetric. Moving all extremities. No gross focal neurologic deficits are appreciated. Skin: Skin is warm, dry and intact. No rash noted. Psychiatric: Mood and affect are normal. Speech and behavior are normal.  ____________________________________________   LABS (all labs ordered are listed, but only abnormal results are displayed)  Labs Reviewed  URINALYSIS COMPLETEWITH MICROSCOPIC ([facility] ONLY) - Abnormal; Notable for the following:       Result Value   Color, Urine YELLOW (*)    APPearance CLEAR (*)    Bacteria, UA FEW (*)    Squamous Epithelial / LPF 0-5 (*)    All other components within normal limits  CBC WITH DIFFERENTIAL/PLATELET - Abnormal; Notable for the following:    WBC 14.0 (*)    Hemoglobin 11.2 (*)    HCT 32.9 (*)    RDW 17.4 (*)    Neutro Abs 12.5 (*)    All other components within normal limits  BASIC METABOLIC PANEL - Abnormal; Notable for the following:    Chloride 100 (*)    Glucose, Bld 137 (*)    BUN 29 (*)    Creatinine, Ser 1.47 (*)    GFR calc non Af Amer 31 (*)    GFR calc Af Amer 36 (*)    All other components within normal limits  BASIC METABOLIC PANEL - Abnormal; Notable for the following:    Glucose, Bld 153 (*)    BUN 27 (*)    Creatinine, Ser 1.29 (*)     Calcium 8.1 (*)    GFR calc non Af Wyvonnia Lora  36 (*)    GFR calc Af Amer 42 (*)    All other components within normal limits  URINE CULTURE   ____________________________________________  EKG  none  ____________________________________________  RADIOLOGY  XR knee: negative  XR ankle: negative  XR pelvis: negative  XR lumbar spine: Progressive L2 superior endplate compression fracture without bony retropulsion. ____________________________________________   PROCEDURES  Procedure(s) performed: None Procedures Critical Care performed:  None ____________________________________________   INITIAL IMPRESSION / ASSESSMENT AND PLAN / ED COURSE  82 y.o. female history of prior stroke with left-sided weakness, recent fall with L2 compression fracture 19 days ago who presents for evaluation of and bilateral hip and L knee pain mostly when she ambulates. Patient has significant ecchymosis and swelling of her left ankle in both medial and lateral aspects, she has no tenderness to palpation on the ankle and has full painless range of motion, she has tenderness to palpation on the medial aspect of her left knee but intact range of motion no warmth or erythema. She has full painless range of motion of bilateral hips with no tenderness to palpation, no CT and L-spine tenderness to palpation. No evidence of trauma to her back. Patient has received tramadol and Tylenol at rehabilitation prior to transfer. We'll give her oxycodone. We'll send patient for x-ray of pelvis with bilateral hips, lumbar spine, x-ray of her left knee and left ankle.  Clinical Course  Comment By Time  Patient's family at the bedside and reports that patient is at her baseline. Patient has no pain at this time and reports that the oxycodone feels better than the tramadol she has been taking at home. According to her grandson patient is wheelchair bound and does not ambulate at baseline. Therefore we have not attempted to  ambulate patient. I reexamined her and she continues to be completely pain-free. Her x-rays were all negative. Her labs showed a creatinine of 1.47 (baseline 0.9). Patient received a liter of normal saline for hydration. Will repeta BMP and if that is improving will plan to dc back to SNF. Will dc tramadol and start patient on oxycodone + standing tylenol for pain. Rudene Re, MD 09/23 1403  GFR and creatinine improving with hydration. Patient feels markedly improved and wishes to go back to her facility. Will dc with increase PO hydration. Rudene Re, MD 09/23 1525    Pertinent labs & imaging results that were available during my care of the patient were reviewed by me and considered in my medical decision making (see chart for details).    ____________________________________________   FINAL CLINICAL IMPRESSION(S) / ED DIAGNOSES  Final diagnoses:  Bilateral hip pain  Left knee pain      NEW MEDICATIONS STARTED DURING THIS VISIT:  New Prescriptions   OXYCODONE (ROXICODONE) 5 MG IMMEDIATE RELEASE TABLET    Take 1 tablet (5 mg total) by mouth every 8 (eight) hours as needed.   SENNA (SENOKOT) 8.6 MG TABS TABLET    Take 1 tablet (8.6 mg total) by mouth daily.     Note:  This document was prepared using Dragon voice recognition software and may include unintentional dictation errors.    Rudene Re, MD 01/26/16 1610

## 2016-01-28 DIAGNOSIS — I69321 Dysphasia following cerebral infarction: Secondary | ICD-10-CM | POA: Diagnosis not present

## 2016-01-28 DIAGNOSIS — I69354 Hemiplegia and hemiparesis following cerebral infarction affecting left non-dominant side: Secondary | ICD-10-CM | POA: Diagnosis not present

## 2016-01-28 DIAGNOSIS — I10 Essential (primary) hypertension: Secondary | ICD-10-CM | POA: Diagnosis not present

## 2016-01-28 DIAGNOSIS — K5900 Constipation, unspecified: Secondary | ICD-10-CM | POA: Diagnosis not present

## 2016-01-28 DIAGNOSIS — M25562 Pain in left knee: Secondary | ICD-10-CM | POA: Diagnosis not present

## 2016-01-28 DIAGNOSIS — J45909 Unspecified asthma, uncomplicated: Secondary | ICD-10-CM | POA: Diagnosis not present

## 2016-01-29 LAB — URINE CULTURE

## 2016-01-30 DIAGNOSIS — K5900 Constipation, unspecified: Secondary | ICD-10-CM | POA: Diagnosis not present

## 2016-01-30 DIAGNOSIS — I10 Essential (primary) hypertension: Secondary | ICD-10-CM | POA: Diagnosis not present

## 2016-01-30 DIAGNOSIS — I69321 Dysphasia following cerebral infarction: Secondary | ICD-10-CM | POA: Diagnosis not present

## 2016-01-30 DIAGNOSIS — I69354 Hemiplegia and hemiparesis following cerebral infarction affecting left non-dominant side: Secondary | ICD-10-CM | POA: Diagnosis not present

## 2016-01-30 DIAGNOSIS — J45909 Unspecified asthma, uncomplicated: Secondary | ICD-10-CM | POA: Diagnosis not present

## 2016-01-30 DIAGNOSIS — M25562 Pain in left knee: Secondary | ICD-10-CM | POA: Diagnosis not present

## 2016-01-31 DIAGNOSIS — J45909 Unspecified asthma, uncomplicated: Secondary | ICD-10-CM | POA: Diagnosis not present

## 2016-01-31 DIAGNOSIS — K5900 Constipation, unspecified: Secondary | ICD-10-CM | POA: Diagnosis not present

## 2016-01-31 DIAGNOSIS — I69321 Dysphasia following cerebral infarction: Secondary | ICD-10-CM | POA: Diagnosis not present

## 2016-01-31 DIAGNOSIS — I10 Essential (primary) hypertension: Secondary | ICD-10-CM | POA: Diagnosis not present

## 2016-01-31 DIAGNOSIS — I69354 Hemiplegia and hemiparesis following cerebral infarction affecting left non-dominant side: Secondary | ICD-10-CM | POA: Diagnosis not present

## 2016-01-31 DIAGNOSIS — M25562 Pain in left knee: Secondary | ICD-10-CM | POA: Diagnosis not present

## 2016-02-01 DIAGNOSIS — M25562 Pain in left knee: Secondary | ICD-10-CM | POA: Diagnosis not present

## 2016-02-01 DIAGNOSIS — K5900 Constipation, unspecified: Secondary | ICD-10-CM | POA: Diagnosis not present

## 2016-02-01 DIAGNOSIS — J45909 Unspecified asthma, uncomplicated: Secondary | ICD-10-CM | POA: Diagnosis not present

## 2016-02-01 DIAGNOSIS — I69321 Dysphasia following cerebral infarction: Secondary | ICD-10-CM | POA: Diagnosis not present

## 2016-02-01 DIAGNOSIS — I10 Essential (primary) hypertension: Secondary | ICD-10-CM | POA: Diagnosis not present

## 2016-02-01 DIAGNOSIS — I69354 Hemiplegia and hemiparesis following cerebral infarction affecting left non-dominant side: Secondary | ICD-10-CM | POA: Diagnosis not present

## 2016-02-04 DIAGNOSIS — I69354 Hemiplegia and hemiparesis following cerebral infarction affecting left non-dominant side: Secondary | ICD-10-CM | POA: Diagnosis not present

## 2016-02-04 DIAGNOSIS — K5900 Constipation, unspecified: Secondary | ICD-10-CM | POA: Diagnosis not present

## 2016-02-04 DIAGNOSIS — I69321 Dysphasia following cerebral infarction: Secondary | ICD-10-CM | POA: Diagnosis not present

## 2016-02-04 DIAGNOSIS — I10 Essential (primary) hypertension: Secondary | ICD-10-CM | POA: Diagnosis not present

## 2016-02-04 DIAGNOSIS — J45909 Unspecified asthma, uncomplicated: Secondary | ICD-10-CM | POA: Diagnosis not present

## 2016-02-04 DIAGNOSIS — M25562 Pain in left knee: Secondary | ICD-10-CM | POA: Diagnosis not present

## 2016-02-05 DIAGNOSIS — M25562 Pain in left knee: Secondary | ICD-10-CM | POA: Diagnosis not present

## 2016-02-05 DIAGNOSIS — I69321 Dysphasia following cerebral infarction: Secondary | ICD-10-CM | POA: Diagnosis not present

## 2016-02-05 DIAGNOSIS — J45909 Unspecified asthma, uncomplicated: Secondary | ICD-10-CM | POA: Diagnosis not present

## 2016-02-05 DIAGNOSIS — I69354 Hemiplegia and hemiparesis following cerebral infarction affecting left non-dominant side: Secondary | ICD-10-CM | POA: Diagnosis not present

## 2016-02-05 DIAGNOSIS — K5900 Constipation, unspecified: Secondary | ICD-10-CM | POA: Diagnosis not present

## 2016-02-05 DIAGNOSIS — I10 Essential (primary) hypertension: Secondary | ICD-10-CM | POA: Diagnosis not present

## 2016-02-05 DIAGNOSIS — K5909 Other constipation: Secondary | ICD-10-CM | POA: Diagnosis not present

## 2016-02-06 ENCOUNTER — Encounter: Payer: Self-pay | Admitting: Emergency Medicine

## 2016-02-06 ENCOUNTER — Emergency Department
Admission: EM | Admit: 2016-02-06 | Discharge: 2016-02-06 | Disposition: A | Discharge status: Home / Self Care | Payer: Medicare Other | Attending: Emergency Medicine | Admitting: Emergency Medicine

## 2016-02-06 DIAGNOSIS — J45909 Unspecified asthma, uncomplicated: Secondary | ICD-10-CM | POA: Diagnosis not present

## 2016-02-06 DIAGNOSIS — Z7982 Long term (current) use of aspirin: Secondary | ICD-10-CM | POA: Insufficient documentation

## 2016-02-06 DIAGNOSIS — Z87891 Personal history of nicotine dependence: Secondary | ICD-10-CM | POA: Insufficient documentation

## 2016-02-06 DIAGNOSIS — K5900 Constipation, unspecified: Secondary | ICD-10-CM | POA: Insufficient documentation

## 2016-02-06 DIAGNOSIS — Z743 Need for continuous supervision: Secondary | ICD-10-CM | POA: Diagnosis not present

## 2016-02-06 DIAGNOSIS — I10 Essential (primary) hypertension: Secondary | ICD-10-CM | POA: Insufficient documentation

## 2016-02-06 DIAGNOSIS — Z79899 Other long term (current) drug therapy: Secondary | ICD-10-CM | POA: Diagnosis not present

## 2016-02-06 DIAGNOSIS — I69354 Hemiplegia and hemiparesis following cerebral infarction affecting left non-dominant side: Secondary | ICD-10-CM | POA: Diagnosis not present

## 2016-02-06 DIAGNOSIS — R6889 Other general symptoms and signs: Secondary | ICD-10-CM | POA: Diagnosis not present

## 2016-02-06 DIAGNOSIS — J449 Chronic obstructive pulmonary disease, unspecified: Secondary | ICD-10-CM | POA: Diagnosis not present

## 2016-02-06 DIAGNOSIS — M25562 Pain in left knee: Secondary | ICD-10-CM | POA: Diagnosis not present

## 2016-02-06 DIAGNOSIS — R531 Weakness: Secondary | ICD-10-CM | POA: Diagnosis not present

## 2016-02-06 DIAGNOSIS — Z8673 Personal history of transient ischemic attack (TIA), and cerebral infarction without residual deficits: Secondary | ICD-10-CM | POA: Diagnosis not present

## 2016-02-06 DIAGNOSIS — I69321 Dysphasia following cerebral infarction: Secondary | ICD-10-CM | POA: Diagnosis not present

## 2016-02-06 MED ORDER — POLYETHYLENE GLYCOL 3350 17 G PO PACK: 17.0000 g | PACK | Freq: Two times a day (BID) | ORAL | 0 refills | Status: DC

## 2016-02-06 MED ORDER — DOCUSATE SODIUM 100 MG PO CAPS
100.0000 mg | ORAL_CAPSULE | Freq: Once | ORAL | Status: AC
  Administered 2016-02-06: 100 mg via ORAL
  Filled 2016-02-06: qty 1

## 2016-02-06 MED ORDER — MAGNESIUM CITRATE PO SOLN
1.0000 | Freq: Once | ORAL | Status: AC
  Administered 2016-02-06: 1 via ORAL
  Filled 2016-02-06: qty 296

## 2016-02-06 NOTE — ED Provider Notes (Signed)
[facility] Emergency Department Provider Note   ____________________________________________   First MD Initiated Contact with Patient 02/06/16 0147     (approximate)  I have reviewed the triage vital signs and the nursing notes.   HISTORY  Chief Complaint Constipation    HPI Ana Schultz is a 82 y.o. female who comes into the hospital today with constipation. The patient reports that she has been begging for something to help her at her nursing home but they would not do anything. She reports that they gave her milk of magnesia and prune juice. She reports that she has had her last bowel movement on Friday and she guesses it was normal. She's had no abdominal pain but has had some back pain which she reports is chronic. She denies any vomiting or nausea. The patient simply has the urge to go the bathroom reports that she can't. She doesn't have any abdominal bloating chest pain or shortness of breath. She reports that she asked him to give her an enema at the nursing home and they would not give her an enema. We did call the nursing home and they report that they gave her MiraLAX, milk of magnesia, Colace and the patient said that nothing was working and asked to be brought in.   Past Medical History:  Diagnosis Date  . Adenoma of rectum   . Adrenal adenoma   . Anemia   . Atrophic vaginitis   . COPD (chronic obstructive pulmonary disease) ([place])   . Diverticulosis   . Dysphagia   . Frequent UTI   . Hemiplegia and hemiparesis ([place])    following cerebral infarction affecting left non-dominant side  . Microscopic hematuria   . Nonrheumatic aortic valve insufficiency   . Osteoporosis   . Paroxysmal atrial fibrillation (HCC)   . Renal cyst   . Stroke ([place])   . Urge incontinence     Patient Active Problem List   Diagnosis Date Noted  . COPD exacerbation ([place]) 11/09/2015  . Dysphagia, pharyngoesophageal phase 10/10/2014  . Aneurysm ([place])  10/09/2014  . Airway hyperreactivity 10/09/2014  . Benign essential HTN 10/09/2014  . BP (high blood pressure) 10/09/2014  . OP (osteoporosis) 10/09/2014  . Addison anemia 10/09/2014  . Tobacco abuse, in remission 08/02/2014  . Failure respiratory ([place]) 07/12/2014  . AI (aortic incompetence) 07/07/2014  . Aneurysm of ascending aorta ([place]) 07/07/2014    Past Surgical History:  Procedure Laterality Date  . ABDOMINAL HYSTERECTOMY    . AORTOILIAC BYPASS      Prior to Admission medications   Medication Sig Start Date End Date Taking? Authorizing Provider  acetaminophen (TYLENOL) 325 MG tablet Take 650 mg by mouth every 4 (four) hours as needed.    Historical Provider, MD  albuterol (PROVENTIL HFA;VENTOLIN HFA) 108 (90 Base) MCG/ACT inhaler Inhale 2 puffs into the lungs every 4 (four) hours as needed for wheezing or shortness of breath. 11/09/15   Flora Lipps, MD  apixaban (ELIQUIS) 2.5 MG TABS tablet Take 2.5 mg by mouth 2 (two) times daily.    Historical Provider, MD  Artificial Tear Ointment (REFRESH P.M. OP) Apply to eye 2 (two) times daily.    Historical Provider, MD  aspirin EC 81 MG tablet Take 81 mg by mouth.    Historical Provider, MD  atorvastatin (LIPITOR) 20 MG tablet Take 20 mg by mouth daily.    Historical Provider, MD  benzonatate (TESSALON) 100 MG capsule Take 100 mg by mouth every 8 (eight) hours  as needed for cough.    Historical Provider, MD  bisacodyl (DULCOLAX) 10 MG suppository Place 10 mg rectally as needed for moderate constipation.    Historical Provider, MD  calcium carbonate (OS-CAL - DOSED IN MG OF ELEMENTAL CALCIUM) 1250 (500 CA) MG tablet Take by mouth.    Historical Provider, MD  camphor-menthol Timoteo Ace) lotion Apply 1 application topically as needed for itching.    Historical Provider, MD  Carboxymethylcellulose Sodium (REFRESH TEARS OP) Apply to eye daily.    Historical Provider, MD  cetirizine (ZYRTEC) 5 MG tablet Take 5 mg by mouth at bedtime as needed for  allergies. Reported on 11/09/2015    Historical Provider, MD  clonazePAM (KLONOPIN) 0.5 MG tablet Take 0.5 mg by mouth every 12 (twelve) hours.    Historical Provider, MD  cyanocobalamin (,VITAMIN B-12,) 1000 MCG/ML injection Inject into the muscle. 09/13/13   Historical Provider, MD  diltiazem (CARDIZEM) 60 MG tablet Take 60 mg by mouth daily.    Historical Provider, MD  fluticasone (FLOVENT HFA) 110 MCG/ACT inhaler Inhale into the lungs. 08/10/14 08/10/15  Historical Provider, MD  fluticasone-salmeterol (ADVAIR HFA) 230-21 MCG/ACT inhaler Inhale 2 puffs into the lungs 2 (two) times daily.    Historical Provider, MD  furosemide (LASIX) 40 MG tablet Take 60 mg by mouth.    Historical Provider, MD  Incontinence Supply Disposable ([employer] BLADDER CONTROL PAD) MISC  05/24/14   Historical Provider, MD  Melatonin 3 MG TABS 6 mg. 08/10/14   Historical Provider, MD  omeprazole (PRILOSEC) 20 MG capsule Take 20 mg by mouth daily.    Historical Provider, MD  oxyCODONE (ROXICODONE) 5 MG immediate release tablet Take 1 tablet (5 mg total) by mouth every 8 (eight) hours as needed. 01/26/16 01/25/17  Rudene Re, MD  polyethylene glycol [facility]) packet Take 17 g by mouth 2 (two) times daily. 02/06/16   Loney Hering, MD  potassium chloride SA (K-DUR,KLOR-CON) 20 MEQ tablet Take 40 mEq by mouth daily.    Historical Provider, MD  predniSONE (DELTASONE) 20 MG tablet Take 1 tablet (20 mg total) by mouth daily with breakfast. 12/24/15   Flora Lipps, MD  senna (SENOKOT) 8.6 MG TABS tablet Take 1 tablet (8.6 mg total) by mouth daily. 01/26/16   Rudene Re, MD  sennosides-docusate sodium (SENOKOT-S) 8.6-50 MG tablet Take 1 tablet by mouth 2 (two) times daily.    Historical Provider, MD  sertraline (ZOLOFT) 50 MG tablet Take 50 mg by mouth daily.    Historical Provider, MD  tiotropium (SPIRIVA) 18 MCG inhalation capsule Place 18 mcg into inhaler and inhale daily.    Historical Provider, MD  triamcinolone cream (KENALOG)  0.1 % Apply 1 application topically 2 (two) times daily.    Historical Provider, MD  Trimethoprim HCl 50 MG/5ML SOLN 100 mg. 08/10/14   Historical Provider, MD    Allergies Iodinated diagnostic agents; Ciprofloxacin; Nitrofurantoin; Solifenacin; and Metronidazole  Family History  Problem Relation Age of Onset  . Lung cancer Son     Social History Social History  Substance Use Topics  . Smoking status: Former Research scientist (life sciences)  . Smokeless tobacco: Never Used  . Alcohol use No    Review of Systems Constitutional: No fever/chills Eyes: No visual changes. ENT: No sore throat. Cardiovascular: Denies chest pain. Respiratory: Denies shortness of breath. Gastrointestinal: constipation With no abdominal pain no vomiting no nausea Genitourinary: Negative for dysuria. Musculoskeletal: Negative for back pain. Skin: Negative for rash. Neurological: Negative for headaches, focal weakness or numbness.  10-point ROS otherwise negative.  ____________________________________________   PHYSICAL EXAM:  VITAL SIGNS: ED Triage Vitals  Enc Vitals Group     BP 02/06/16 0012 (!) 163/70     Pulse Rate 02/06/16 0012 73     Resp 02/06/16 0012 20     Temp 02/06/16 0012 98.1 F (36.7 C)     Temp Source 02/06/16 0012 Oral     SpO2 02/06/16 0012 100 %     Weight 02/06/16 0013 120 lb (54.4 kg)     Height 02/06/16 0013 5\' 1"  (1.549 m)     Head Circumference --      Peak Flow --      Pain Score 02/06/16 0013 7     Pain Loc --      Pain Edu? --      Excl. in [place]? --     Constitutional: Alert and oriented. Well appearing and in Mild distress. Eyes: Conjunctivae are normal. PERRL. EOMI. Head: Atraumatic. Nose: No congestion/rhinnorhea. Mouth/Throat: Mucous membranes are moist.  Oropharynx non-erythematous. Cardiovascular: Normal rate, regular rhythm. Grossly normal heart sounds.  Good peripheral circulation. Respiratory: Normal respiratory effort.  No retractions. Lungs CTAB. Gastrointestinal: Soft  and nontender. No distention.  Rectal: formed stool palpated in the rectal vault Musculoskeletal: No lower extremity tenderness nor edema.   Neurologic:  Normal speech and language.  Skin:  Skin is warm, dry and intact.  Psychiatric: Mood and affect are normal.   ____________________________________________   LABS (all labs ordered are listed, but only abnormal results are displayed)  Labs Reviewed - No data to display ____________________________________________  EKG  none ____________________________________________  RADIOLOGY  none ____________________________________________   PROCEDURES  Procedure(s) performed: None  Procedures  Critical Care performed: No  ____________________________________________   INITIAL IMPRESSION / ASSESSMENT AND PLAN / ED COURSE  Pertinent labs & imaging results that were available during my care of the patient were reviewed by me and considered in my medical decision making (see chart for details).  This is an 82 year old female who comes into the hospital today with constipation. The patient has not been able to have a bowel movement and she reports that she does not like the medication she is receiving at the nursing home. I will attempt to get the patient an enema as she does have some stool in her vault. She has no abdominal pain no abdominal distention or bloating and no nausea or vomiting.  Clinical Course   We did attempt enema here but the patient did not tolerate it. I went and spoke to her and she said that she didn't understand what that meant to hold in the fluids. The patient did pass some gas as well as some fluid when she was here. I discussed with the patient using some oral methods to attempt to relieve her constipation. The patient does take chronic pain medications which also contributes to her constipation. I will send the patient back to the nursing home at this time. She has no further complaints or  concerns.  ____________________________________________   FINAL CLINICAL IMPRESSION(S) / ED DIAGNOSES  Final diagnoses:  Constipation, unspecified constipation type      NEW MEDICATIONS STARTED DURING THIS VISIT:  Discharge Medication List as of 02/06/2016  5:14 AM    START taking these medications   Details  polyethylene glycol (MIRALAX) packet Take 17 g by mouth 2 (two) times daily., Starting Wed 02/06/2016, Print         Note:  This document was prepared using  Dragon [employer] forces training and education officer and may include unintentional dictation errors.    Loney Hering, MD 02/06/16 [phone]

## 2016-02-06 NOTE — ED Notes (Signed)
Pt's brief and sheets changed.

## 2016-02-06 NOTE — ED Notes (Signed)
Pt repositioned

## 2016-02-06 NOTE — ED Triage Notes (Signed)
Pt arrived via ems from [facility] with complaints of constipation since Friday. Per ems report the [facility] reporting attempting "everything but an enema." Pt is alert and oriented x 4. Pt had a stroke in 2016 with resulted in left sided deficits.

## 2016-02-06 NOTE — ED Notes (Signed)
Pt's brief changed and pt repositioned.

## 2016-02-06 NOTE — ED Notes (Signed)
Pt unable to tolerate enema, as soon as enema was inserted it would come out. Tried to slow down enema and given breaks in between; however pt still unable to hold any of the enema.

## 2016-02-06 NOTE — ED Notes (Signed)
Pt assisted to the bathroom. Pt unable to bear weight of left side.

## 2016-02-08 DIAGNOSIS — M25562 Pain in left knee: Secondary | ICD-10-CM | POA: Diagnosis not present

## 2016-02-08 DIAGNOSIS — I10 Essential (primary) hypertension: Secondary | ICD-10-CM | POA: Diagnosis not present

## 2016-02-08 DIAGNOSIS — J45909 Unspecified asthma, uncomplicated: Secondary | ICD-10-CM | POA: Diagnosis not present

## 2016-02-08 DIAGNOSIS — I69354 Hemiplegia and hemiparesis following cerebral infarction affecting left non-dominant side: Secondary | ICD-10-CM | POA: Diagnosis not present

## 2016-02-08 DIAGNOSIS — K5900 Constipation, unspecified: Secondary | ICD-10-CM | POA: Diagnosis not present

## 2016-02-08 DIAGNOSIS — I69321 Dysphasia following cerebral infarction: Secondary | ICD-10-CM | POA: Diagnosis not present

## 2016-02-11 DIAGNOSIS — I69321 Dysphasia following cerebral infarction: Secondary | ICD-10-CM | POA: Diagnosis not present

## 2016-02-11 DIAGNOSIS — I69354 Hemiplegia and hemiparesis following cerebral infarction affecting left non-dominant side: Secondary | ICD-10-CM | POA: Diagnosis not present

## 2016-02-11 DIAGNOSIS — I10 Essential (primary) hypertension: Secondary | ICD-10-CM | POA: Diagnosis not present

## 2016-02-11 DIAGNOSIS — K5900 Constipation, unspecified: Secondary | ICD-10-CM | POA: Diagnosis not present

## 2016-02-11 DIAGNOSIS — M25562 Pain in left knee: Secondary | ICD-10-CM | POA: Diagnosis not present

## 2016-02-11 DIAGNOSIS — J45909 Unspecified asthma, uncomplicated: Secondary | ICD-10-CM | POA: Diagnosis not present

## 2016-02-12 DIAGNOSIS — J45909 Unspecified asthma, uncomplicated: Secondary | ICD-10-CM | POA: Diagnosis not present

## 2016-02-12 DIAGNOSIS — I69354 Hemiplegia and hemiparesis following cerebral infarction affecting left non-dominant side: Secondary | ICD-10-CM | POA: Diagnosis not present

## 2016-02-12 DIAGNOSIS — I10 Essential (primary) hypertension: Secondary | ICD-10-CM | POA: Diagnosis not present

## 2016-02-12 DIAGNOSIS — K5900 Constipation, unspecified: Secondary | ICD-10-CM | POA: Diagnosis not present

## 2016-02-12 DIAGNOSIS — M25562 Pain in left knee: Secondary | ICD-10-CM | POA: Diagnosis not present

## 2016-02-12 DIAGNOSIS — I69321 Dysphasia following cerebral infarction: Secondary | ICD-10-CM | POA: Diagnosis not present

## 2016-02-13 DIAGNOSIS — K5900 Constipation, unspecified: Secondary | ICD-10-CM | POA: Diagnosis not present

## 2016-02-13 DIAGNOSIS — I69321 Dysphasia following cerebral infarction: Secondary | ICD-10-CM | POA: Diagnosis not present

## 2016-02-13 DIAGNOSIS — I69354 Hemiplegia and hemiparesis following cerebral infarction affecting left non-dominant side: Secondary | ICD-10-CM | POA: Diagnosis not present

## 2016-02-13 DIAGNOSIS — M25562 Pain in left knee: Secondary | ICD-10-CM | POA: Diagnosis not present

## 2016-02-13 DIAGNOSIS — J45909 Unspecified asthma, uncomplicated: Secondary | ICD-10-CM | POA: Diagnosis not present

## 2016-02-13 DIAGNOSIS — I10 Essential (primary) hypertension: Secondary | ICD-10-CM | POA: Diagnosis not present

## 2016-02-14 DIAGNOSIS — S32000S Wedge compression fracture of unspecified lumbar vertebra, sequela: Secondary | ICD-10-CM | POA: Diagnosis not present

## 2016-02-14 DIAGNOSIS — Z23 Encounter for immunization: Secondary | ICD-10-CM | POA: Diagnosis not present

## 2016-02-14 DIAGNOSIS — M545 Low back pain: Secondary | ICD-10-CM | POA: Diagnosis not present

## 2016-02-15 ENCOUNTER — Other Ambulatory Visit: Payer: Self-pay | Admitting: Adult Health

## 2016-02-15 DIAGNOSIS — M545 Low back pain: Secondary | ICD-10-CM

## 2016-02-15 DIAGNOSIS — S32020K Wedge compression fracture of second lumbar vertebra, subsequent encounter for fracture with nonunion: Secondary | ICD-10-CM

## 2016-02-18 ENCOUNTER — Ambulatory Visit: Payer: Medicare Other

## 2016-02-18 DIAGNOSIS — I69354 Hemiplegia and hemiparesis following cerebral infarction affecting left non-dominant side: Secondary | ICD-10-CM | POA: Diagnosis not present

## 2016-02-18 DIAGNOSIS — M25562 Pain in left knee: Secondary | ICD-10-CM | POA: Diagnosis not present

## 2016-02-18 DIAGNOSIS — I10 Essential (primary) hypertension: Secondary | ICD-10-CM | POA: Diagnosis not present

## 2016-02-18 DIAGNOSIS — J45909 Unspecified asthma, uncomplicated: Secondary | ICD-10-CM | POA: Diagnosis not present

## 2016-02-18 DIAGNOSIS — I69321 Dysphasia following cerebral infarction: Secondary | ICD-10-CM | POA: Diagnosis not present

## 2016-02-18 DIAGNOSIS — K5900 Constipation, unspecified: Secondary | ICD-10-CM | POA: Diagnosis not present

## 2016-02-20 DIAGNOSIS — M25562 Pain in left knee: Secondary | ICD-10-CM | POA: Diagnosis not present

## 2016-02-20 DIAGNOSIS — I10 Essential (primary) hypertension: Secondary | ICD-10-CM | POA: Diagnosis not present

## 2016-02-20 DIAGNOSIS — I69321 Dysphasia following cerebral infarction: Secondary | ICD-10-CM | POA: Diagnosis not present

## 2016-02-20 DIAGNOSIS — I69354 Hemiplegia and hemiparesis following cerebral infarction affecting left non-dominant side: Secondary | ICD-10-CM | POA: Diagnosis not present

## 2016-02-20 DIAGNOSIS — K5900 Constipation, unspecified: Secondary | ICD-10-CM | POA: Diagnosis not present

## 2016-02-20 DIAGNOSIS — J45909 Unspecified asthma, uncomplicated: Secondary | ICD-10-CM | POA: Diagnosis not present

## 2016-02-21 DIAGNOSIS — I69354 Hemiplegia and hemiparesis following cerebral infarction affecting left non-dominant side: Secondary | ICD-10-CM | POA: Diagnosis not present

## 2016-02-21 DIAGNOSIS — I69321 Dysphasia following cerebral infarction: Secondary | ICD-10-CM | POA: Diagnosis not present

## 2016-02-21 DIAGNOSIS — K5900 Constipation, unspecified: Secondary | ICD-10-CM | POA: Diagnosis not present

## 2016-02-21 DIAGNOSIS — M25562 Pain in left knee: Secondary | ICD-10-CM | POA: Diagnosis not present

## 2016-02-21 DIAGNOSIS — J45909 Unspecified asthma, uncomplicated: Secondary | ICD-10-CM | POA: Diagnosis not present

## 2016-02-21 DIAGNOSIS — I10 Essential (primary) hypertension: Secondary | ICD-10-CM | POA: Diagnosis not present

## 2016-02-25 ENCOUNTER — Emergency Department: Payer: Medicare Other

## 2016-02-25 ENCOUNTER — Inpatient Hospital Stay
Admission: EM | Admit: 2016-02-25 | Discharge: 2016-03-01 | DRG: 481 | Disposition: A | Discharge status: Skilled Nursing Facility | Payer: Medicare Other | Attending: Internal Medicine | Admitting: Internal Medicine

## 2016-02-25 ENCOUNTER — Encounter: Payer: Self-pay | Admitting: Emergency Medicine

## 2016-02-25 DIAGNOSIS — Z7982 Long term (current) use of aspirin: Secondary | ICD-10-CM

## 2016-02-25 DIAGNOSIS — S72142A Displaced intertrochanteric fracture of left femur, initial encounter for closed fracture: Secondary | ICD-10-CM | POA: Diagnosis present

## 2016-02-25 DIAGNOSIS — R1310 Dysphagia, unspecified: Secondary | ICD-10-CM | POA: Diagnosis present

## 2016-02-25 DIAGNOSIS — N183 Chronic kidney disease, stage 3 (moderate): Secondary | ICD-10-CM | POA: Diagnosis present

## 2016-02-25 DIAGNOSIS — S72143A Displaced intertrochanteric fracture of unspecified femur, initial encounter for closed fracture: Secondary | ICD-10-CM | POA: Diagnosis present

## 2016-02-25 DIAGNOSIS — Z7951 Long term (current) use of inhaled steroids: Secondary | ICD-10-CM | POA: Diagnosis not present

## 2016-02-25 DIAGNOSIS — Z87891 Personal history of nicotine dependence: Secondary | ICD-10-CM

## 2016-02-25 DIAGNOSIS — M810 Age-related osteoporosis without current pathological fracture: Secondary | ICD-10-CM | POA: Diagnosis present

## 2016-02-25 DIAGNOSIS — F419 Anxiety disorder, unspecified: Secondary | ICD-10-CM | POA: Diagnosis not present

## 2016-02-25 DIAGNOSIS — Z881 Allergy status to other antibiotic agents status: Secondary | ICD-10-CM

## 2016-02-25 DIAGNOSIS — W010XXA Fall on same level from slipping, tripping and stumbling without subsequent striking against object, initial encounter: Secondary | ICD-10-CM | POA: Diagnosis present

## 2016-02-25 DIAGNOSIS — M25552 Pain in left hip: Secondary | ICD-10-CM | POA: Diagnosis not present

## 2016-02-25 DIAGNOSIS — Z01810 Encounter for preprocedural cardiovascular examination: Secondary | ICD-10-CM | POA: Diagnosis not present

## 2016-02-25 DIAGNOSIS — F325 Major depressive disorder, single episode, in full remission: Secondary | ICD-10-CM | POA: Diagnosis not present

## 2016-02-25 DIAGNOSIS — Z419 Encounter for procedure for purposes other than remedying health state, unspecified: Secondary | ICD-10-CM | POA: Diagnosis not present

## 2016-02-25 DIAGNOSIS — R6889 Other general symptoms and signs: Secondary | ICD-10-CM | POA: Diagnosis not present

## 2016-02-25 DIAGNOSIS — Z91041 Radiographic dye allergy status: Secondary | ICD-10-CM

## 2016-02-25 DIAGNOSIS — I712 Thoracic aortic aneurysm, without rupture: Secondary | ICD-10-CM | POA: Diagnosis not present

## 2016-02-25 DIAGNOSIS — Z7901 Long term (current) use of anticoagulants: Secondary | ICD-10-CM

## 2016-02-25 DIAGNOSIS — D62 Acute posthemorrhagic anemia: Secondary | ICD-10-CM | POA: Diagnosis not present

## 2016-02-25 DIAGNOSIS — I129 Hypertensive chronic kidney disease with stage 1 through stage 4 chronic kidney disease, or unspecified chronic kidney disease: Secondary | ICD-10-CM | POA: Diagnosis present

## 2016-02-25 DIAGNOSIS — Z888 Allergy status to other drugs, medicaments and biological substances status: Secondary | ICD-10-CM

## 2016-02-25 DIAGNOSIS — I083 Combined rheumatic disorders of mitral, aortic and tricuspid valves: Secondary | ICD-10-CM | POA: Diagnosis present

## 2016-02-25 DIAGNOSIS — M80052D Age-related osteoporosis with current pathological fracture, left femur, subsequent encounter for fracture with routine healing: Secondary | ICD-10-CM | POA: Diagnosis not present

## 2016-02-25 DIAGNOSIS — G8194 Hemiplegia, unspecified affecting left nondominant side: Secondary | ICD-10-CM | POA: Diagnosis not present

## 2016-02-25 DIAGNOSIS — H04123 Dry eye syndrome of bilateral lacrimal glands: Secondary | ICD-10-CM | POA: Diagnosis not present

## 2016-02-25 DIAGNOSIS — Z792 Long term (current) use of antibiotics: Secondary | ICD-10-CM | POA: Diagnosis not present

## 2016-02-25 DIAGNOSIS — Z79899 Other long term (current) drug therapy: Secondary | ICD-10-CM

## 2016-02-25 DIAGNOSIS — Z8249 Family history of ischemic heart disease and other diseases of the circulatory system: Secondary | ICD-10-CM

## 2016-02-25 DIAGNOSIS — G510 Bell's palsy: Secondary | ICD-10-CM | POA: Diagnosis not present

## 2016-02-25 DIAGNOSIS — I351 Nonrheumatic aortic (valve) insufficiency: Secondary | ICD-10-CM | POA: Diagnosis not present

## 2016-02-25 DIAGNOSIS — Z9181 History of falling: Secondary | ICD-10-CM | POA: Diagnosis not present

## 2016-02-25 DIAGNOSIS — Z87440 Personal history of urinary (tract) infections: Secondary | ICD-10-CM | POA: Diagnosis not present

## 2016-02-25 DIAGNOSIS — R2689 Other abnormalities of gait and mobility: Secondary | ICD-10-CM

## 2016-02-25 DIAGNOSIS — I480 Paroxysmal atrial fibrillation: Secondary | ICD-10-CM | POA: Diagnosis not present

## 2016-02-25 DIAGNOSIS — Z7952 Long term (current) use of systemic steroids: Secondary | ICD-10-CM

## 2016-02-25 DIAGNOSIS — M8008XD Age-related osteoporosis with current pathological fracture, vertebra(e), subsequent encounter for fracture with routine healing: Secondary | ICD-10-CM | POA: Diagnosis not present

## 2016-02-25 DIAGNOSIS — Z7401 Bed confinement status: Secondary | ICD-10-CM | POA: Diagnosis not present

## 2016-02-25 DIAGNOSIS — G4700 Insomnia, unspecified: Secondary | ICD-10-CM | POA: Diagnosis not present

## 2016-02-25 DIAGNOSIS — J449 Chronic obstructive pulmonary disease, unspecified: Secondary | ICD-10-CM | POA: Diagnosis present

## 2016-02-25 DIAGNOSIS — D6481 Anemia due to antineoplastic chemotherapy: Secondary | ICD-10-CM | POA: Diagnosis not present

## 2016-02-25 DIAGNOSIS — I69391 Dysphagia following cerebral infarction: Secondary | ICD-10-CM | POA: Diagnosis not present

## 2016-02-25 DIAGNOSIS — Z8679 Personal history of other diseases of the circulatory system: Secondary | ICD-10-CM | POA: Diagnosis not present

## 2016-02-25 DIAGNOSIS — R41841 Cognitive communication deficit: Secondary | ICD-10-CM | POA: Diagnosis not present

## 2016-02-25 DIAGNOSIS — W19XXXA Unspecified fall, initial encounter: Secondary | ICD-10-CM | POA: Diagnosis not present

## 2016-02-25 DIAGNOSIS — I69354 Hemiplegia and hemiparesis following cerebral infarction affecting left non-dominant side: Secondary | ICD-10-CM

## 2016-02-25 DIAGNOSIS — S72002A Fracture of unspecified part of neck of left femur, initial encounter for closed fracture: Secondary | ICD-10-CM

## 2016-02-25 DIAGNOSIS — S72145A Nondisplaced intertrochanteric fracture of left femur, initial encounter for closed fracture: Secondary | ICD-10-CM | POA: Diagnosis not present

## 2016-02-25 DIAGNOSIS — R262 Difficulty in walking, not elsewhere classified: Secondary | ICD-10-CM | POA: Diagnosis not present

## 2016-02-25 DIAGNOSIS — M6281 Muscle weakness (generalized): Secondary | ICD-10-CM

## 2016-02-25 DIAGNOSIS — R1312 Dysphagia, oropharyngeal phase: Secondary | ICD-10-CM | POA: Diagnosis not present

## 2016-02-25 DIAGNOSIS — D510 Vitamin B12 deficiency anemia due to intrinsic factor deficiency: Secondary | ICD-10-CM | POA: Diagnosis not present

## 2016-02-25 DIAGNOSIS — J309 Allergic rhinitis, unspecified: Secondary | ICD-10-CM | POA: Diagnosis not present

## 2016-02-25 LAB — URINALYSIS COMPLETE WITH MICROSCOPIC (ARMC ONLY)
BACTERIA UA: NONE SEEN
Bilirubin Urine: NEGATIVE
Glucose, UA: NEGATIVE mg/dL
HGB URINE DIPSTICK: NEGATIVE
KETONES UR: NEGATIVE mg/dL
LEUKOCYTES UA: NEGATIVE
NITRITE: NEGATIVE
PH: 7 (ref 5.0–8.0)
PROTEIN: NEGATIVE mg/dL
SPECIFIC GRAVITY, URINE: 1.008 (ref 1.005–1.030)
Squamous Epithelial / LPF: NONE SEEN

## 2016-02-25 LAB — COMPREHENSIVE METABOLIC PANEL
ALBUMIN: 4.1 g/dL (ref 3.5–5.0)
ALT: 34 U/L (ref 14–54)
ALT: 32 U/L (ref 14–54)
ANION GAP: 7 (ref 5–15)
AST: 20 U/L (ref 15–41)
AST: 19 U/L (ref 15–41)
Albumin: 3.6 g/dL (ref 3.5–5.0)
Alkaline Phosphatase: 131 U/L — ABNORMAL HIGH (ref 38–126)
Alkaline Phosphatase: 110 U/L (ref 38–126)
Anion gap: 8 (ref 5–15)
BILIRUBIN TOTAL: 0.8 mg/dL (ref 0.3–1.2)
BUN: 25 mg/dL — AB (ref 6–20)
BUN: 24 mg/dL — ABNORMAL HIGH (ref 6–20)
CHLORIDE: 103 mmol/L (ref 101–111)
CO2: 30 mmol/L (ref 22–32)
CO2: 29 mmol/L (ref 22–32)
Calcium: 9.2 mg/dL (ref 8.9–10.3)
Calcium: 8.7 mg/dL — ABNORMAL LOW (ref 8.9–10.3)
Chloride: 103 mmol/L (ref 101–111)
Creatinine, Ser: 1.23 mg/dL — ABNORMAL HIGH (ref 0.44–1.00)
Creatinine, Ser: 1.12 mg/dL — ABNORMAL HIGH (ref 0.44–1.00)
GFR calc Af Amer: 44 mL/min — ABNORMAL LOW (ref >60)
GFR calc Af Amer: 49 mL/min — ABNORMAL LOW (ref >60)
GFR calc non Af Amer: 38 mL/min — ABNORMAL LOW (ref >60)
GFR calc non Af Amer: 43 mL/min — ABNORMAL LOW (ref >60)
GLUCOSE: 113 mg/dL — AB (ref 65–99)
Glucose, Bld: 128 mg/dL — ABNORMAL HIGH (ref 65–99)
POTASSIUM: 3.6 mmol/L (ref 3.5–5.1)
Potassium: 3.9 mmol/L (ref 3.5–5.1)
SODIUM: 140 mmol/L (ref 135–145)
Sodium: 140 mmol/L (ref 135–145)
TOTAL PROTEIN: 7 g/dL (ref 6.5–8.1)
Total Bilirubin: 1.1 mg/dL (ref 0.3–1.2)
Total Protein: 6.1 g/dL — ABNORMAL LOW (ref 6.5–8.1)

## 2016-02-25 LAB — CBC WITH DIFFERENTIAL/PLATELET
Basophils Absolute: 0 10*3/uL (ref 0–0.1)
Basophils Relative: 0 %
Eosinophils Absolute: 0 10*3/uL (ref 0–0.7)
Eosinophils Relative: 0 %
HCT: 33.2 % — ABNORMAL LOW (ref 35.0–47.0)
Hemoglobin: 11.1 g/dL — ABNORMAL LOW (ref 12.0–16.0)
Lymphocytes Relative: 8 %
Lymphs Abs: 0.9 10*3/uL — ABNORMAL LOW (ref 1.0–3.6)
MCH: 29.2 pg (ref 26.0–34.0)
MCHC: 33.5 g/dL (ref 32.0–36.0)
MCV: 87.1 fL (ref 80.0–100.0)
Monocytes Absolute: 0.9 10*3/uL (ref 0.2–0.9)
Monocytes Relative: 8 %
Neutro Abs: 9.6 10*3/uL — ABNORMAL HIGH (ref 1.4–6.5)
Neutrophils Relative %: 84 %
Platelets: 177 10*3/uL (ref 150–440)
RBC: 3.81 MIL/uL (ref 3.80–5.20)
RDW: 17.8 % — ABNORMAL HIGH (ref 11.5–14.5)
WBC: 11.4 10*3/uL — ABNORMAL HIGH (ref 3.6–11.0)

## 2016-02-25 LAB — CBC
HEMATOCRIT: 36.7 % (ref 35.0–47.0)
Hemoglobin: 12.2 g/dL (ref 12.0–16.0)
MCH: 28.9 pg (ref 26.0–34.0)
MCHC: 33.2 g/dL (ref 32.0–36.0)
MCV: 87 fL (ref 80.0–100.0)
Platelets: 203 10*3/uL (ref 150–440)
RBC: 4.22 MIL/uL (ref 3.80–5.20)
RDW: 18 % — AB (ref 11.5–14.5)
WBC: 12.9 10*3/uL — AB (ref 3.6–11.0)

## 2016-02-25 LAB — PROTIME-INR
INR: 1.03
INR: 1.05
Prothrombin Time: 13.5 seconds (ref 11.4–15.2)
Prothrombin Time: 13.7 seconds (ref 11.4–15.2)

## 2016-02-25 LAB — TSH: TSH: 1.05 u[IU]/mL (ref 0.350–4.500)

## 2016-02-25 LAB — SURGICAL PCR SCREEN
MRSA, PCR: NEGATIVE
Staphylococcus aureus: NEGATIVE

## 2016-02-25 LAB — APTT: aPTT: 26 seconds (ref 24–36)

## 2016-02-25 MED ORDER — MORPHINE SULFATE (PF) 2 MG/ML IV SOLN
2.0000 mg | Freq: Once | INTRAVENOUS | Status: AC
  Administered 2016-02-25: 2 mg via INTRAVENOUS

## 2016-02-25 MED ORDER — OXYCODONE HCL 5 MG PO TABS
5.0000 mg | ORAL_TABLET | Freq: Three times a day (TID) | ORAL | Status: DC | PRN
  Administered 2016-02-26 – 2016-02-27 (×2): 5 mg via ORAL
  Filled 2016-02-25 (×2): qty 1

## 2016-02-25 MED ORDER — CLONAZEPAM 0.5 MG PO TABS: 0.2500 mg | ORAL_TABLET | Freq: Two times a day (BID) | ORAL | Status: DC | PRN

## 2016-02-25 MED ORDER — CLINDAMYCIN PHOSPHATE 600 MG/50ML IV SOLN
600.0000 mg | Freq: Once | INTRAVENOUS | Status: DC
  Filled 2016-02-25: qty 50

## 2016-02-25 MED ORDER — POTASSIUM CHLORIDE CRYS ER 20 MEQ PO TBCR
20.0000 meq | EXTENDED_RELEASE_TABLET | Freq: Two times a day (BID) | ORAL | Status: DC
  Administered 2016-02-25 – 2016-03-01 (×10): 20 meq via ORAL
  Filled 2016-02-25 (×11): qty 1

## 2016-02-25 MED ORDER — MELATONIN 5 MG PO TABS
5.0000 mg | ORAL_TABLET | Freq: Every day | ORAL | Status: DC
  Administered 2016-02-25 – 2016-02-29 (×5): 5 mg via ORAL
  Filled 2016-02-25 (×5): qty 1

## 2016-02-25 MED ORDER — POLYETHYLENE GLYCOL 3350 17 G PO PACK
17.0000 g | PACK | Freq: Two times a day (BID) | ORAL | Status: DC | PRN
  Administered 2016-02-28: 17 g via ORAL
  Filled 2016-02-25: qty 1

## 2016-02-25 MED ORDER — ONDANSETRON HCL 4 MG PO TABS: 4.0000 mg | ORAL_TABLET | Freq: Four times a day (QID) | ORAL | Status: DC | PRN

## 2016-02-25 MED ORDER — METHOCARBAMOL 500 MG PO TABS: 500.0000 mg | ORAL_TABLET | Freq: Four times a day (QID) | ORAL | Status: DC | PRN

## 2016-02-25 MED ORDER — MELATONIN 3 MG PO TABS
6.0000 mg | ORAL_TABLET | Freq: Every day | ORAL | Status: DC
  Filled 2016-02-25: qty 2

## 2016-02-25 MED ORDER — MAGNESIUM HYDROXIDE 400 MG/5ML PO SUSP
30.0000 mL | Freq: Every day | ORAL | Status: DC | PRN
  Administered 2016-02-28 – 2016-02-29 (×2): 30 mL via ORAL
  Filled 2016-02-25 (×2): qty 30

## 2016-02-25 MED ORDER — FUROSEMIDE 40 MG PO TABS
60.0000 mg | ORAL_TABLET | Freq: Two times a day (BID) | ORAL | Status: DC
  Administered 2016-02-27 – 2016-03-01 (×7): 60 mg via ORAL
  Filled 2016-02-25 (×7): qty 1

## 2016-02-25 MED ORDER — TRIMETHOPRIM 100 MG PO TABS
100.0000 mg | ORAL_TABLET | Freq: Every day | ORAL | Status: DC
Start: 2016-02-25 — End: 2016-03-01
  Administered 2016-02-27 – 2016-03-01 (×4): 100 mg via ORAL
  Filled 2016-02-25 (×6): qty 1

## 2016-02-25 MED ORDER — MOMETASONE FURO-FORMOTEROL FUM 200-5 MCG/ACT IN AERO
2.0000 | INHALATION_SPRAY | Freq: Two times a day (BID) | RESPIRATORY_TRACT | Status: DC
  Administered 2016-02-25 – 2016-03-01 (×10): 2 via RESPIRATORY_TRACT
  Filled 2016-02-25: qty 8.8

## 2016-02-25 MED ORDER — ONDANSETRON HCL 4 MG/2ML IJ SOLN: 4.0000 mg | Freq: Four times a day (QID) | INTRAMUSCULAR | Status: DC | PRN

## 2016-02-25 MED ORDER — METHOCARBAMOL 500 MG PO TABS
500.0000 mg | ORAL_TABLET | Freq: Four times a day (QID) | ORAL | Status: DC | PRN
  Administered 2016-02-25 – 2016-02-29 (×6): 500 mg via ORAL
  Filled 2016-02-25 (×6): qty 1

## 2016-02-25 MED ORDER — ATORVASTATIN CALCIUM 20 MG PO TABS
20.0000 mg | ORAL_TABLET | Freq: Every day | ORAL | Status: DC
Start: 2016-02-25 — End: 2016-03-01
  Administered 2016-02-25 – 2016-02-29 (×5): 20 mg via ORAL
  Filled 2016-02-25 (×5): qty 1

## 2016-02-25 MED ORDER — SODIUM CHLORIDE 0.9 % IV SOLN
INTRAVENOUS | Status: DC
  Administered 2016-02-25 – 2016-02-26 (×3): via INTRAVENOUS

## 2016-02-25 MED ORDER — FUROSEMIDE 40 MG PO TABS: 60.0000 mg | ORAL_TABLET | Freq: Two times a day (BID) | ORAL | Status: DC

## 2016-02-25 MED ORDER — METHYLPREDNISOLONE SODIUM SUCC 125 MG IJ SOLR
125.0000 mg | Freq: Once | INTRAMUSCULAR | Status: AC
  Administered 2016-02-26: 125 mg via INTRAVENOUS

## 2016-02-25 MED ORDER — BENZONATATE 100 MG PO CAPS: 100.0000 mg | ORAL_CAPSULE | Freq: Three times a day (TID) | ORAL | Status: DC | PRN

## 2016-02-25 MED ORDER — POLYVINYL ALCOHOL 1.4 % OP SOLN
1.0000 [drp] | OPHTHALMIC | Status: DC | PRN
  Filled 2016-02-25: qty 15

## 2016-02-25 MED ORDER — FLUTICASONE PROPIONATE 50 MCG/ACT NA SUSP
2.0000 | Freq: Every day | NASAL | Status: DC | PRN
  Filled 2016-02-25: qty 16

## 2016-02-25 MED ORDER — ACETAMINOPHEN 325 MG PO TABS: 650.0000 mg | ORAL_TABLET | ORAL | Status: DC | PRN

## 2016-02-25 MED ORDER — ALBUTEROL SULFATE HFA 108 (90 BASE) MCG/ACT IN AERS: 2.0000 | INHALATION_SPRAY | RESPIRATORY_TRACT | Status: DC | PRN

## 2016-02-25 MED ORDER — LABETALOL HCL 5 MG/ML IV SOLN
5.0000 mg | INTRAVENOUS | Status: DC | PRN
  Filled 2016-02-25: qty 4

## 2016-02-25 MED ORDER — ALBUTEROL SULFATE (2.5 MG/3ML) 0.083% IN NEBU: 2.5000 mg | INHALATION_SOLUTION | RESPIRATORY_TRACT | Status: DC | PRN

## 2016-02-25 MED ORDER — ARTIFICIAL TEARS OP OINT
1.0000 "application " | TOPICAL_OINTMENT | Freq: Four times a day (QID) | OPHTHALMIC | Status: DC
  Administered 2016-02-25 – 2016-03-01 (×9): 1 via OPHTHALMIC
  Filled 2016-02-25 (×3): qty 3.5

## 2016-02-25 MED ORDER — PANTOPRAZOLE SODIUM 40 MG PO TBEC
40.0000 mg | DELAYED_RELEASE_TABLET | Freq: Every day | ORAL | Status: DC
Start: 2016-02-25 — End: 2016-03-01
  Administered 2016-02-25 – 2016-03-01 (×5): 40 mg via ORAL
  Filled 2016-02-25 (×5): qty 1

## 2016-02-25 MED ORDER — HYDROCODONE-ACETAMINOPHEN 5-325 MG PO TABS
1.0000 | ORAL_TABLET | Freq: Four times a day (QID) | ORAL | Status: DC | PRN
  Administered 2016-02-25 (×3): 2 via ORAL
  Filled 2016-02-25 (×3): qty 2

## 2016-02-25 MED ORDER — APIXABAN 2.5 MG PO TABS: 2.5000 mg | ORAL_TABLET | Freq: Two times a day (BID) | ORAL | Status: DC

## 2016-02-25 MED ORDER — MORPHINE SULFATE (PF) 2 MG/ML IV SOLN
1.0000 mg | INTRAVENOUS | Status: DC | PRN
  Administered 2016-02-25 (×2): 1 mg via INTRAVENOUS
  Filled 2016-02-25 (×2): qty 1

## 2016-02-25 MED ORDER — METHOCARBAMOL 1000 MG/10ML IJ SOLN
500.0000 mg | Freq: Four times a day (QID) | INTRAMUSCULAR | Status: DC | PRN
  Filled 2016-02-25: qty 5

## 2016-02-25 MED ORDER — DOCUSATE SODIUM 100 MG PO CAPS
100.0000 mg | ORAL_CAPSULE | Freq: Two times a day (BID) | ORAL | Status: DC
  Administered 2016-02-25 – 2016-03-01 (×10): 100 mg via ORAL
  Filled 2016-02-25 (×10): qty 1

## 2016-02-25 MED ORDER — CEFAZOLIN SODIUM-DEXTROSE 2-4 GM/100ML-% IV SOLN
2.0000 g | Freq: Once | INTRAVENOUS | Status: AC
  Administered 2016-02-26: 2 g via INTRAVENOUS
  Filled 2016-02-25: qty 100

## 2016-02-25 MED ORDER — FUROSEMIDE 40 MG PO TABS
40.0000 mg | ORAL_TABLET | Freq: Every day | ORAL | Status: DC
  Administered 2016-02-25: 40 mg via ORAL
  Filled 2016-02-25: qty 1

## 2016-02-25 MED ORDER — MORPHINE SULFATE (PF) 2 MG/ML IV SOLN
2.0000 mg | Freq: Once | INTRAVENOUS | Status: AC
  Administered 2016-02-25: 2 mg via INTRAVENOUS
  Filled 2016-02-25: qty 1

## 2016-02-25 MED ORDER — SENNA 8.6 MG PO TABS
1.0000 | ORAL_TABLET | Freq: Two times a day (BID) | ORAL | Status: DC
Start: 2016-02-25 — End: 2016-03-01
  Administered 2016-02-25 – 2016-03-01 (×10): 8.6 mg via ORAL
  Filled 2016-02-25 (×10): qty 1

## 2016-02-25 MED ORDER — ZOLPIDEM TARTRATE 5 MG PO TABS: 5.0000 mg | ORAL_TABLET | Freq: Every evening | ORAL | Status: DC | PRN

## 2016-02-25 MED ORDER — SENNA 8.6 MG PO TABS: 1.0000 | ORAL_TABLET | Freq: Every day | ORAL | Status: DC

## 2016-02-25 MED ORDER — ONDANSETRON HCL 4 MG/2ML IJ SOLN
4.0000 mg | Freq: Once | INTRAMUSCULAR | Status: AC
  Administered 2016-02-25: 4 mg via INTRAVENOUS
  Filled 2016-02-25: qty 2

## 2016-02-25 MED ORDER — PREDNISONE 20 MG PO TABS
20.0000 mg | ORAL_TABLET | Freq: Every day | ORAL | Status: DC
  Administered 2016-02-25 – 2016-03-01 (×5): 20 mg via ORAL
  Filled 2016-02-25 (×5): qty 1

## 2016-02-25 MED ORDER — ASPIRIN EC 81 MG PO TBEC: 81.0000 mg | DELAYED_RELEASE_TABLET | Freq: Every day | ORAL | Status: DC

## 2016-02-25 MED ORDER — SERTRALINE HCL 50 MG PO TABS
50.0000 mg | ORAL_TABLET | Freq: Every day | ORAL | Status: DC
  Administered 2016-02-25 – 2016-03-01 (×5): 50 mg via ORAL
  Filled 2016-02-25 (×5): qty 1

## 2016-02-25 MED ORDER — TIOTROPIUM BROMIDE MONOHYDRATE 18 MCG IN CAPS
18.0000 ug | ORAL_CAPSULE | Freq: Every day | RESPIRATORY_TRACT | Status: DC
  Administered 2016-02-26 – 2016-03-01 (×5): 18 ug via RESPIRATORY_TRACT
  Filled 2016-02-25: qty 5

## 2016-02-25 MED ORDER — VITAMIN B-12 1000 MCG PO TABS
1000.0000 ug | ORAL_TABLET | Freq: Every day | ORAL | Status: DC
  Administered 2016-02-25 – 2016-03-01 (×5): 1000 ug via ORAL
  Filled 2016-02-25 (×5): qty 1

## 2016-02-25 MED ORDER — TRAZODONE HCL 50 MG PO TABS: 50.0000 mg | ORAL_TABLET | Freq: Every evening | ORAL | Status: DC | PRN

## 2016-02-25 MED ORDER — DILTIAZEM HCL 30 MG PO TABS
60.0000 mg | ORAL_TABLET | Freq: Every day | ORAL | Status: DC
  Administered 2016-02-25 – 2016-02-29 (×4): 60 mg via ORAL
  Filled 2016-02-25 (×4): qty 2

## 2016-02-25 NOTE — Progress Notes (Signed)
Chaplain responded to order to visited patient who had requested for advance directives. Upon arrival, patient told chaplain she had not requested for Ad, but rather she had asked for a chaplain's visit and was very pleased a chaplain visited her. Patient who was with her daughter, a granddaughter and 2 grandsons requested for prayers and spiritual support which was provided.    02/25/16 1700  Clinical Encounter Type  Visited With Patient  Visit Type Initial;ED  Referral From Nurse  Consult/Referral To Chaplain

## 2016-02-25 NOTE — Consult Note (Signed)
ORTHOPAEDIC CONSULTATION  REQUESTING PHYSICIAN: Nicholes Mango, MD  Chief Complaint: Left hip pain  HPI: Ana Schultz is a 82 y.o. female who complains of  left hip pain following a fall last night at her assisted living facility. Patient had a very serious CVA in 2016 with residual left leg mild weakness and severe upper extremity weakness. She was brought to the emergency room where exam and x-rays revealed a mildly displaced intertrochanteric fracture left hip. She was admitted to the hospitalist service for medical evaluation and stabilization. Patient takes Eliquis. Discussed treatment options with the patient and her daughter. I recommended surgical fixation of the fracture tomorrow to minimize pain and facilitate nursing care. The risks and benefits of surgery and nonsurgical treatment were discussed with them. She will need a skilled nursing facility postoperatively. Patient is alert and oriented and wishes to proceed with surgery. Surgery is delayed to tomorrow due to her request. She is also on prednisone and we will give her some IV Solu-Medrol at surgery.  Past Medical History:  Diagnosis Date  . Adenoma of rectum   . Adrenal adenoma   . Anemia   . Atrophic vaginitis   . COPD (chronic obstructive pulmonary disease) ([place])   . Diverticulosis   . Dysphagia   . Frequent UTI   . Hemiplegia and hemiparesis ([place])    following cerebral infarction affecting left non-dominant side  . Microscopic hematuria   . Nonrheumatic aortic valve insufficiency   . Osteoporosis   . Paroxysmal atrial fibrillation ([facility])   . Renal cyst   . Stroke ([place])   . Urge incontinence    Past Surgical History:  Procedure Laterality Date  . ABDOMINAL HYSTERECTOMY    . AORTOILIAC BYPASS     Social History   Social History  . Marital status: Widowed    Spouse name: N/A  . Number of children: N/A  . Years of education: N/A   Social History Main Topics  . Smoking status: Former Research scientist (life sciences)  . Smokeless  tobacco: Never Used  . Alcohol use No  . Drug use: No  . Sexual activity: Not Asked   Other Topics Concern  . None   Social History Narrative  . None   Family History  Problem Relation Age of Onset  . Lung cancer Son   . CAD Father   . CAD Brother    Allergies  Allergen Reactions  . Iodinated Diagnostic Agents Itching and Swelling  . Ciprofloxacin Other (See Comments)    Colitis  . Nitrofurantoin Diarrhea  . Solifenacin Other (See Comments)    indigestion  . Metronidazole Itching and Rash   Prior to Admission medications   Medication Sig Start Date End Date Taking? Authorizing Provider  acetaminophen (TYLENOL) 325 MG tablet Take 650 mg by mouth 4 (four) times daily.    Yes Historical Provider, MD  acetaminophen (TYLENOL) 325 MG tablet Take 650 mg by mouth every 4 (four) hours as needed for mild pain or fever.   Yes Historical Provider, MD  albuterol (PROVENTIL HFA;VENTOLIN HFA) 108 (90 Base) MCG/ACT inhaler Inhale 2 puffs into the lungs every 4 (four) hours as needed for wheezing or shortness of breath. 11/09/15  Yes Flora Lipps, MD  apixaban (ELIQUIS) 2.5 MG TABS tablet Take 2.5 mg by mouth 2 (two) times daily.   Yes Historical Provider, MD  Artificial Tear Ointment (ARTIFICIAL TEARS) ointment Place 1 application into the left eye 4 (four) times daily.   Yes Historical Provider, MD  aspirin EC  81 MG tablet Take 81 mg by mouth daily.    Yes Historical Provider, MD  atorvastatin (LIPITOR) 20 MG tablet Take 20 mg by mouth at bedtime.    Yes Historical Provider, MD  benzonatate (TESSALON) 100 MG capsule Take 100 mg by mouth every 8 (eight) hours as needed for cough.   Yes Historical Provider, MD  bisacodyl (DULCOLAX) 10 MG suppository Place 10 mg rectally daily as needed for moderate constipation.    Yes Historical Provider, MD  clonazePAM (KLONOPIN) 0.5 MG tablet Take 0.25 mg by mouth daily as needed for anxiety.    Yes Historical Provider, MD  clonazePAM (KLONOPIN) 0.5 MG tablet  Take 0.25 mg by mouth daily.   Yes Historical Provider, MD  cyanocobalamin 1000 MCG tablet Take 1,000 mcg by mouth daily.   Yes Historical Provider, MD  diclofenac sodium (VOLTAREN) 1 % GEL Apply 4 g topically 4 (four) times daily as needed (pain).   Yes Historical Provider, MD  diltiazem (CARDIZEM) 60 MG tablet Take 60 mg by mouth daily.   Yes Historical Provider, MD  fluticasone-salmeterol (ADVAIR HFA) 230-21 MCG/ACT inhaler Inhale 2 puffs into the lungs 2 (two) times daily.   Yes Historical Provider, MD  furosemide (LASIX) 40 MG tablet Take 60 mg by mouth 2 (two) times daily.    Yes Historical Provider, MD  ipratropium (ATROVENT) 0.03 % nasal spray Place 2 sprays into both nostrils every 8 (eight) hours as needed for rhinitis.   Yes Historical Provider, MD  magnesium hydroxide (MILK OF MAGNESIA) 400 MG/5ML suspension Take 30 mLs by mouth daily as needed for mild constipation.   Yes Historical Provider, MD  Melatonin 3 MG TABS Take 6 mg by mouth at bedtime.  08/10/14  Yes Historical Provider, MD  methocarbamol (ROBAXIN) 500 MG tablet Take 500 mg by mouth every 6 (six) hours as needed for muscle spasms.   Yes Historical Provider, MD  omeprazole (PRILOSEC) 40 MG capsule Take 40 mg by mouth daily.    Yes Historical Provider, MD  oxyCODONE (ROXICODONE) 5 MG immediate release tablet Take 1 tablet (5 mg total) by mouth every 8 (eight) hours as needed. Patient taking differently: Take 5 mg by mouth every 8 (eight) hours as needed for severe pain.  01/26/16 01/25/17 Yes Rudene Re, MD  polyethylene glycol [facility]) packet Take 17 g by mouth 2 (two) times daily. 02/06/16  Yes Loney Hering, MD  polyvinyl alcohol-povidone (HYPOTEARS) 1.4-0.6 % ophthalmic solution Place 1 drop into both eyes as needed (dry eyes).   Yes Historical Provider, MD  potassium chloride SA (K-DUR,KLOR-CON) 20 MEQ tablet Take 20 mEq by mouth 2 (two) times daily.    Yes Historical Provider, MD  predniSONE (DELTASONE) 20 MG tablet  Take 1 tablet (20 mg total) by mouth daily with breakfast. 12/24/15  Yes Flora Lipps, MD  senna (SENOKOT) 8.6 MG TABS tablet Take 1 tablet (8.6 mg total) by mouth daily. 01/26/16  Yes Rudene Re, MD  sertraline (ZOLOFT) 50 MG tablet Take 50 mg by mouth daily.   Yes Historical Provider, MD  tiotropium (SPIRIVA) 18 MCG inhalation capsule Place 18 mcg into inhaler and inhale daily.   Yes Historical Provider, MD  traZODone (DESYREL) 50 MG tablet Take 50 mg by mouth at bedtime as needed for sleep.   Yes Historical Provider, MD  trimethoprim (TRIMPEX) 100 MG tablet Take 100 mg by mouth daily.   Yes Historical Provider, MD  fluticasone (FLOVENT HFA) 110 MCG/ACT inhaler Inhale into the lungs. 08/10/14  08/10/15  Historical Provider, MD   Dg Hip Unilat W Or Wo Pelvis 2-3 Views Left  Result Date: 02/25/2016 CLINICAL DATA:  Slipped and fell in bathroom. LEFT leg pain and shortening. EXAM: DG HIP (WITH OR WITHOUT PELVIS) 2-3V LEFT; LEFT FEMUR 2 VIEWS COMPARISON:  Pelvic radiograph January 26, 2016 FINDINGS: Nondisplaced LEFT femur intertrochanteric fracture with mild impaction best seen on the femoral radiograph. No dislocation. Severe osteopenia. No destructive bony lesions. Included soft tissue planes are non-suspicious. Moderate vascular calcifications. Similar mild lower lumbar compression fractures. IMPRESSION: Acute nondisplaced LEFT femur intertrochanteric fracture with impaction. No dislocation. Osteopenia, decreasing sensitivity for acute nondisplaced fractures. Electronically Signed   By: Elon Alas M.D.   On: 02/25/2016 05:25   Dg Femur Min 2 Views Left  Result Date: 02/25/2016 CLINICAL DATA:  Slipped and fell in bathroom. LEFT leg pain and shortening. EXAM: DG HIP (WITH OR WITHOUT PELVIS) 2-3V LEFT; LEFT FEMUR 2 VIEWS COMPARISON:  Pelvic radiograph January 26, 2016 FINDINGS: Nondisplaced LEFT femur intertrochanteric fracture with mild impaction best seen on the femoral radiograph. No  dislocation. Severe osteopenia. No destructive bony lesions. Included soft tissue planes are non-suspicious. Moderate vascular calcifications. Similar mild lower lumbar compression fractures. IMPRESSION: Acute nondisplaced LEFT femur intertrochanteric fracture with impaction. No dislocation. Osteopenia, decreasing sensitivity for acute nondisplaced fractures. Electronically Signed   By: Elon Alas M.D.   On: 02/25/2016 05:25    Positive ROS: All other systems have been reviewed and were otherwise negative with the exception of those mentioned in the HPI and as above.  Physical Exam: General: Alert, no acute distress Cardiovascular: No pedal edema Respiratory: No cyanosis, no use of accessory musculature GI: No organomegaly, abdomen is soft and non-tender Skin: No lesions in the area of chief complaint Neurologic: Sensation intact distally Psychiatric: Patient is competent for consent with normal mood and affect Lymphatic: No axillary or cervical lymphadenopathy  MUSCULOSKELETAL: Patient's alert and cooperative. The left leg is short and rotated. There is severe pain with rotation of the hip. Neurovascular status is good distally and the skin is intact. The right leg is pain-free. No other orthopedic injuries are noted.  Assessment: Intertrochanteric fracture left hip  Plan: ORIF left hip with TFN device    Park Breed, MD [phone]   02/25/2016 4:03 PM

## 2016-02-25 NOTE — Clinical Social Work Note (Signed)
Clinical Social Work Assessment  Patient Details  Name: Ana Schultz MRN: [id number] Date of Birth: [birth year]  Date of referral:  02/25/16               Reason for consult:  Facility Placement                Permission sought to share information with:  Chartered certified accountant granted to share information::  Yes, Verbal Permission Granted  Name::      [place]::     Relationship::     Contact Information:     Housing/Transportation Living arrangements for the past 2 months:  [place], [place] of Information:  Patient, Adult Children Patient Interpreter Needed:  None Criminal Activity/Legal Involvement Pertinent to Current Situation/Hospitalization:  No - Comment as needed Significant Relationships:  Adult Children Lives with:  Facility Resident Do you feel safe going back to the place where you live?  Yes Need for family participation in patient care:  Yes (Comment)  Care giving concerns:  Patient is a long term care resident at [facility].    Social Worker assessment / plan:  Holiday representative (CSW) reviewed chart and noted that patient is having surgery for a hip fracture. CSW met with patient and her daughter Ana Schultz was at bedside. CSW introduced self and explained role of CSW department. Patient reported that she was at [facility] for rehab and then went to live at [facility] in June 2017. Per daughter patient walks with a walker at baseline and is open to [facility] home [facility] for PT. CSW explained that patient will likely need to go to SNF for rehab before returning to [facility]. CSW explained that patient will require a 3 night qualifying inpatient stay in order for Medicare to pay for SNF. Patient and daughter are agreeable to SNF search and prefer [facility] again.   FL2 complete and faxed out. CSW will continue to follow and assist as needed.      Employment status:   Retired Forensic scientist:  Medicare PT Recommendations:  Not assessed at this time Information / Referral to community resources:  [place]  Patient/Family's Response to care:  Patient and daughter are agreeable to SNF search.   Patient/Family's Understanding of and Emotional Response to Diagnosis, Current Treatment, and Prognosis:  Patient and daughter were pleasant and thanked CSW for assistance.   Emotional Assessment Appearance:  Appears stated age Attitude/Demeanor/Rapport:    Affect (typically observed):  Accepting, Adaptable, Pleasant Orientation:  Oriented to Self, Oriented to Place, Oriented to  Time, Oriented to Situation Alcohol / Substance use:  Not Applicable Psych involvement (Current and /or in the community):  No (Comment)  Discharge Needs  Concerns to be addressed:  Discharge Planning Concerns Readmission within the last 30 days:  No Current discharge risk:  Dependent with Mobility Barriers to Discharge:  Continued Medical Work up   [employer], Ana Beets, LCSW 02/25/2016, 4:43 PM

## 2016-02-25 NOTE — ED Provider Notes (Signed)
[facility] Emergency Department Provider Note    First MD Initiated Contact with Patient 02/25/16 [phone]     (approximate)  I have reviewed the triage vital signs and the nursing notes.   HISTORY  Chief Complaint Fall    HPI Ana Schultz is a 82 y.o. female with history of CVA with resultant left upper and lower extremity weakness presents to the emergency department with history of axilla slip and fall with left hip injury. No head injury.   Past Medical History:  Diagnosis Date  . Adenoma of rectum   . Adrenal adenoma   . Anemia   . Atrophic vaginitis   . COPD (chronic obstructive pulmonary disease) ([place])   . Diverticulosis   . Dysphagia   . Frequent UTI   . Hemiplegia and hemiparesis ([place])    following cerebral infarction affecting left non-dominant side  . Microscopic hematuria   . Nonrheumatic aortic valve insufficiency   . Osteoporosis   . Paroxysmal atrial fibrillation (HCC)   . Renal cyst   . Stroke ([place])   . Urge incontinence     Patient Active Problem List   Diagnosis Date Noted  . COPD exacerbation ([place]) 11/09/2015  . Dysphagia, pharyngoesophageal phase 10/10/2014  . Aneurysm ([place]) 10/09/2014  . Airway hyperreactivity 10/09/2014  . Benign essential HTN 10/09/2014  . BP (high blood pressure) 10/09/2014  . OP (osteoporosis) 10/09/2014  . Addison anemia 10/09/2014  . Tobacco abuse, in remission 08/02/2014  . Failure respiratory ([place]) 07/12/2014  . AI (aortic incompetence) 07/07/2014  . Aneurysm of ascending aorta ([place]) 07/07/2014    Past Surgical History:  Procedure Laterality Date  . ABDOMINAL HYSTERECTOMY    . AORTOILIAC BYPASS      Prior to Admission medications   Medication Sig Start Date End Date Taking? Authorizing Provider  acetaminophen (TYLENOL) 325 MG tablet Take 650 mg by mouth every 4 (four) hours as needed.    Historical Provider, MD  albuterol (PROVENTIL HFA;VENTOLIN HFA) 108 (90 Base) MCG/ACT  inhaler Inhale 2 puffs into the lungs every 4 (four) hours as needed for wheezing or shortness of breath. 11/09/15   Flora Lipps, MD  apixaban (ELIQUIS) 2.5 MG TABS tablet Take 2.5 mg by mouth 2 (two) times daily.    Historical Provider, MD  Artificial Tear Ointment (REFRESH P.M. OP) Apply to eye 2 (two) times daily.    Historical Provider, MD  aspirin EC 81 MG tablet Take 81 mg by mouth.    Historical Provider, MD  atorvastatin (LIPITOR) 20 MG tablet Take 20 mg by mouth daily.    Historical Provider, MD  benzonatate (TESSALON) 100 MG capsule Take 100 mg by mouth every 8 (eight) hours as needed for cough.    Historical Provider, MD  bisacodyl (DULCOLAX) 10 MG suppository Place 10 mg rectally as needed for moderate constipation.    Historical Provider, MD  calcium carbonate (OS-CAL - DOSED IN MG OF ELEMENTAL CALCIUM) 1250 (500 CA) MG tablet Take by mouth.    Historical Provider, MD  camphor-menthol Timoteo Ace) lotion Apply 1 application topically as needed for itching.    Historical Provider, MD  Carboxymethylcellulose Sodium (REFRESH TEARS OP) Apply to eye daily.    Historical Provider, MD  cetirizine (ZYRTEC) 5 MG tablet Take 5 mg by mouth at bedtime as needed for allergies. Reported on 11/09/2015    Historical Provider, MD  clonazePAM (KLONOPIN) 0.5 MG tablet Take 0.5 mg by mouth every 12 (twelve) hours.    Historical  Provider, MD  cyanocobalamin (,VITAMIN B-12,) 1000 MCG/ML injection Inject into the muscle. 09/13/13   Historical Provider, MD  diltiazem (CARDIZEM) 60 MG tablet Take 60 mg by mouth daily.    Historical Provider, MD  fluticasone (FLOVENT HFA) 110 MCG/ACT inhaler Inhale into the lungs. 08/10/14 08/10/15  Historical Provider, MD  fluticasone-salmeterol (ADVAIR HFA) 230-21 MCG/ACT inhaler Inhale 2 puffs into the lungs 2 (two) times daily.    Historical Provider, MD  furosemide (LASIX) 40 MG tablet Take 60 mg by mouth.    Historical Provider, MD  Incontinence Supply Disposable ([employer] BLADDER CONTROL  PAD) MISC  05/24/14   Historical Provider, MD  Melatonin 3 MG TABS 6 mg. 08/10/14   Historical Provider, MD  omeprazole (PRILOSEC) 20 MG capsule Take 20 mg by mouth daily.    Historical Provider, MD  oxyCODONE (ROXICODONE) 5 MG immediate release tablet Take 1 tablet (5 mg total) by mouth every 8 (eight) hours as needed. 01/26/16 01/25/17  Rudene Re, MD  polyethylene glycol [facility]) packet Take 17 g by mouth 2 (two) times daily. 02/06/16   Loney Hering, MD  potassium chloride SA (K-DUR,KLOR-CON) 20 MEQ tablet Take 40 mEq by mouth daily.    Historical Provider, MD  predniSONE (DELTASONE) 20 MG tablet Take 1 tablet (20 mg total) by mouth daily with breakfast. 12/24/15   Flora Lipps, MD  senna (SENOKOT) 8.6 MG TABS tablet Take 1 tablet (8.6 mg total) by mouth daily. 01/26/16   Rudene Re, MD  sennosides-docusate sodium (SENOKOT-S) 8.6-50 MG tablet Take 1 tablet by mouth 2 (two) times daily.    Historical Provider, MD  sertraline (ZOLOFT) 50 MG tablet Take 50 mg by mouth daily.    Historical Provider, MD  tiotropium (SPIRIVA) 18 MCG inhalation capsule Place 18 mcg into inhaler and inhale daily.    Historical Provider, MD  triamcinolone cream (KENALOG) 0.1 % Apply 1 application topically 2 (two) times daily.    Historical Provider, MD  Trimethoprim HCl 50 MG/5ML SOLN 100 mg. 08/10/14   Historical Provider, MD    Allergies Iodinated diagnostic agents; Ciprofloxacin; Nitrofurantoin; Solifenacin; and Metronidazole  Family History  Problem Relation Age of Onset  . Lung cancer Son     Social History Social History  Substance Use Topics  . Smoking status: Former Research scientist (life sciences)  . Smokeless tobacco: Never Used  . Alcohol use No    Review of Systems Constitutional: No fever/chills Eyes: No visual changes. ENT: No sore throat. Cardiovascular: Denies chest pain. Respiratory: Denies shortness of breath. Gastrointestinal: No abdominal pain.  No nausea, no vomiting.  No diarrhea.  No  constipation. Genitourinary: Negative for dysuria. Musculoskeletal: Negative for back pain. Skin: Negative for rash. Neurological: Negative for headaches, focal weakness or numbness.  10-point ROS otherwise negative.  ____________________________________________   PHYSICAL EXAM:  VITAL SIGNS: ED Triage Vitals  Enc Vitals Group     BP 02/25/16 0312 (!) 135/106     Pulse Rate 02/25/16 0312 92     Resp 02/25/16 0312 18     Temp 02/25/16 0312 98.1 F (36.7 C)     Temp Source 02/25/16 0312 Oral     SpO2 02/25/16 0312 92 %     Weight 02/25/16 0314 120 lb (54.4 kg)     Height 02/25/16 0314 5\' 1"  (1.549 m)     Head Circumference --      Peak Flow --      Pain Score 02/25/16 0314 5     Pain Loc --  Pain Edu? --      Excl. in [place]? --     Constitutional: Alert and oriented. Well appearing and in no acute distress. Eyes: Conjunctivae are normal. PERRL. EOMI. Head: Atraumatic. Mouth/Throat: Mucous membranes are moist.  Oropharynx non-erythematous. Neck: No stridor.  No cervical spine tenderness to palpation. Cardiovascular: Normal rate, regular rhythm. Good peripheral circulation. Grossly normal heart sounds. Respiratory: Normal respiratory effort.  No retractions. Lungs CTAB. Gastrointestinal: Soft and nontender. No distention.  Musculoskeletal: No lower extremity tenderness nor edema. No gross deformities of extremities. Neurologic:  Normal speech and language. No gross focal neurologic deficits are appreciated.  Skin:  Skin is warm, dry and intact. No rash noted. Psychiatric: Mood and affect are normal. Speech and behavior are normal.  ____________________________________________   LABS (all labs ordered are listed, but only abnormal results are displayed)  Labs Reviewed  CBC - Abnormal; Notable for the following:       Result Value   WBC 12.9 (*)    RDW 18.0 (*)    All other components within normal limits  COMPREHENSIVE METABOLIC PANEL - Abnormal; Notable for the  following:    Glucose, Bld 113 (*)    BUN 25 (*)    Creatinine, Ser 1.23 (*)    Alkaline Phosphatase 131 (*)    GFR calc non Af Amer 38 (*)    GFR calc Af Amer 44 (*)    All other components within normal limits  URINALYSIS COMPLETEWITH MICROSCOPIC ([facility] ONLY) - Abnormal; Notable for the following:    Color, Urine STRAW (*)    APPearance CLEAR (*)    All other components within normal limits  PROTIME-INR   ____________________________________________  EKG  ED ECG REPORT I, Martinez N BROWN, the attending physician, personally viewed and interpreted this ECG.   Date: 02/25/2016  EKG Time: 3:19 AM  Rate: 77  Rhythm: Normal sinus rhythm  Axis: Normal  Intervals: Normal  ST&T Change: None  ____________________________________________  RADIOLOGY I, [place] N BROWN, personally viewed and evaluated these images (plain radiographs) as part of my medical decision making, as well as reviewing the written report by the radiologist.  Dg Hip Unilat W Or Wo Pelvis 2-3 Views Left  Result Date: 02/25/2016 CLINICAL DATA:  Slipped and fell in bathroom. LEFT leg pain and shortening. EXAM: DG HIP (WITH OR WITHOUT PELVIS) 2-3V LEFT; LEFT FEMUR 2 VIEWS COMPARISON:  Pelvic radiograph January 26, 2016 FINDINGS: Nondisplaced LEFT femur intertrochanteric fracture with mild impaction best seen on the femoral radiograph. No dislocation. Severe osteopenia. No destructive bony lesions. Included soft tissue planes are non-suspicious. Moderate vascular calcifications. Similar mild lower lumbar compression fractures. IMPRESSION: Acute nondisplaced LEFT femur intertrochanteric fracture with impaction. No dislocation. Osteopenia, decreasing sensitivity for acute nondisplaced fractures. Electronically Signed   By: Elon Alas M.D.   On: 02/25/2016 05:25   Dg Femur Min 2 Views Left  Result Date: 02/25/2016 CLINICAL DATA:  Slipped and fell in bathroom. LEFT leg pain and shortening. EXAM: DG HIP (WITH  OR WITHOUT PELVIS) 2-3V LEFT; LEFT FEMUR 2 VIEWS COMPARISON:  Pelvic radiograph January 26, 2016 FINDINGS: Nondisplaced LEFT femur intertrochanteric fracture with mild impaction best seen on the femoral radiograph. No dislocation. Severe osteopenia. No destructive bony lesions. Included soft tissue planes are non-suspicious. Moderate vascular calcifications. Similar mild lower lumbar compression fractures. IMPRESSION: Acute nondisplaced LEFT femur intertrochanteric fracture with impaction. No dislocation. Osteopenia, decreasing sensitivity for acute nondisplaced fractures. Electronically Signed   By: Elon Alas M.D.   On:  02/25/2016 05:25    ______  Procedures     INITIAL IMPRESSION / ASSESSMENT AND PLAN / ED COURSE  Pertinent labs & imaging results that were available during my care of the patient were reviewed by me and considered in my medical decision making (see chart for details). Patient received 2 doses of IV morphine with improvement of pain. Patient discussed with Dr. Marcille Blanco for hospital admission for left intertrochanteric fracture. Patient also discussed with Dr. Sabra Heck orthopedist on call  Clinical Course  Value Comment By Time  DG Hip Unilat W or Wo Pelvis 2-3 Views Left (Reviewed) Gregor Hams, MD 10/23 [phone]  DG Hip Unilat W or Wo Pelvis 2-3 Views Left (Reviewed) Gregor Hams, MD 10/23 [phone]  DG Hip Unilat W or Wo Pelvis 2-3 Views Left (Reviewed) Gregor Hams, MD 10/23 0349  DG Hip Unilat W or Wo Pelvis 2-3 Views Left (Reviewed) Gregor Hams, MD 10/23 0349    ____________________________________________  FINAL CLINICAL IMPRESSION(S) / ED DIAGNOSES  Final diagnoses:  Closed fracture of left hip, initial encounter [facility])     MEDICATIONS GIVEN DURING THIS VISIT:  Medications  morphine 2 MG/ML injection 2 mg (2 mg Intravenous Given 02/25/16 0401)  ondansetron (ZOFRAN) injection 4 mg (4 mg Intravenous Given 02/25/16 0401)  morphine 2 MG/ML  injection 2 mg (2 mg Intravenous Given 02/25/16 0506)     NEW OUTPATIENT MEDICATIONS STARTED DURING THIS VISIT:  New Prescriptions   No medications on file    Modified Medications   No medications on file    Discontinued Medications   No medications on file     Note:  This document was prepared using Dragon voice recognition software and may include unintentional dictation errors.    Gregor Hams, MD 02/25/16 [phone]

## 2016-02-25 NOTE — Progress Notes (Signed)
[place] [facility] [place] NAME: Ana Schultz    MR#:  [id number]  DATE OF BIRTH:  June 29, 1925  SUBJECTIVE:  CHIEF COMPLAINT: pts pain is well controlled, no other complaints.Family bed side   REVIEW OF SYSTEMS:  CONSTITUTIONAL: No fever, fatigue or weakness.  EYES: No blurred or double vision.  EARS, NOSE, AND THROAT: No tinnitus or ear pain.  RESPIRATORY: No cough, shortness of breath, wheezing or hemoptysis.  CARDIOVASCULAR: No chest pain, orthopnea, edema.  GASTROINTESTINAL: No nausea, vomiting, diarrhea or abdominal pain.  GENITOURINARY: No dysuria, hematuria.  ENDOCRINE: No polyuria, nocturia,  HEMATOLOGY: No anemia, easy bruising or bleeding SKIN: No rash or lesion. MUSCULOSKELETAL: lt hip joint pain    NEUROLOGIC: No tingling, numbness, weakness.  PSYCHIATRY: No anxiety or depression.   DRUG ALLERGIES:   Allergies  Allergen Reactions  . Iodinated Diagnostic Agents Itching and Swelling  . Ciprofloxacin Other (See Comments)    Colitis  . Nitrofurantoin Diarrhea  . Solifenacin Other (See Comments)    indigestion  . Metronidazole Itching and Rash    VITALS:  Blood pressure (!) 126/53, pulse 82, temperature 99.2 F (37.3 C), temperature source Oral, resp. rate 18, height 5\' 1"  (1.549 m), weight 54.4 kg (120 lb), SpO2 95 %.  PHYSICAL EXAMINATION:  GENERAL:  82 y.o.-year-old patient lying in the bed with no acute distress.  EYES: Pupils equal, round, reactive to light and accommodation. No scleral icterus. Extraocular muscles intact.  HEENT: Head atraumatic, normocephalic. Oropharynx and nasopharynx clear.  NECK:  Supple, no jugular venous distention. No thyroid enlargement, no tenderness.  LUNGS: Normal breath sounds bilaterally, no wheezing, rales,rhonchi or crepitation. No use of accessory muscles of respiration.  CARDIOVASCULAR: S1, S2 normal. No murmurs, rubs, or gallops.  ABDOMEN: Soft, nontender, nondistended.  Bowel sounds present. No organomegaly or mass.  EXTREMITIES: No pedal edema, cyanosis, or clubbing.  NEUROLOGIC: Cranial nerves II through XII are intact. Muscle strength 5/5 in all extremities except lt hip-trnder,int rotated. Sensation intact. Gait not checked.  PSYCHIATRIC: The patient is alert and oriented x 3.  SKIN: No obvious rash, lesion, or ulcer.    LABORATORY PANEL:   CBC  Recent Labs Lab 02/25/16 1243  WBC 11.4*  HGB 11.1*  HCT 33.2*  PLT 177   ------------------------------------------------------------------------------------------------------------------  Chemistries   Recent Labs Lab 02/25/16 1243  NA 140  K 3.9  CL 103  CO2 29  GLUCOSE 128*  BUN 24*  CREATININE 1.12*  CALCIUM 8.7*  AST 19  ALT 32  ALKPHOS 110  BILITOT 1.1   ------------------------------------------------------------------------------------------------------------------  Cardiac Enzymes No results for input(s): TROPONINI in the last 168 hours. ------------------------------------------------------------------------------------------------------------------  RADIOLOGY:  Dg Hip Unilat W Or Wo Pelvis 2-3 Views Left  Result Date: 02/25/2016 CLINICAL DATA:  Slipped and fell in bathroom. LEFT leg pain and shortening. EXAM: DG HIP (WITH OR WITHOUT PELVIS) 2-3V LEFT; LEFT FEMUR 2 VIEWS COMPARISON:  Pelvic radiograph January 26, 2016 FINDINGS: Nondisplaced LEFT femur intertrochanteric fracture with mild impaction best seen on the femoral radiograph. No dislocation. Severe osteopenia. No destructive bony lesions. Included soft tissue planes are non-suspicious. Moderate vascular calcifications. Similar mild lower lumbar compression fractures. IMPRESSION: Acute nondisplaced LEFT femur intertrochanteric fracture with impaction. No dislocation. Osteopenia, decreasing sensitivity for acute nondisplaced fractures. Electronically Signed   By: Elon Alas M.D.   On: 02/25/2016 05:25   Dg  Femur Min 2 Views Left  Result Date: 02/25/2016 CLINICAL DATA:  Slipped and fell in bathroom.  LEFT leg pain and shortening. EXAM: DG HIP (WITH OR WITHOUT PELVIS) 2-3V LEFT; LEFT FEMUR 2 VIEWS COMPARISON:  Pelvic radiograph January 26, 2016 FINDINGS: Nondisplaced LEFT femur intertrochanteric fracture with mild impaction best seen on the femoral radiograph. No dislocation. Severe osteopenia. No destructive bony lesions. Included soft tissue planes are non-suspicious. Moderate vascular calcifications. Similar mild lower lumbar compression fractures. IMPRESSION: Acute nondisplaced LEFT femur intertrochanteric fracture with impaction. No dislocation. Osteopenia, decreasing sensitivity for acute nondisplaced fractures. Electronically Signed   By: Elon Alas M.D.   On: 02/25/2016 05:25    EKG:   Orders placed or performed during the hospital encounter of 02/25/16  . EKG 12-Lead  . EKG 12-Lead    ASSESSMENT AND PLAN:   This is an 82 year old female admitted for femur fracture.  1.Acute intertrochanteric fracture of left  NPO after midnight for surgery in am Pain management by ortho dr.Miller Holdng her aspirin as well as systemic anticoagulation prior to surgery.  2. Hypertension: better today Continue Cardizem  . Labetalol as needed  3. Chronic kidney disease: Stage III;  resume Lasix scheduled bid  Avoid nephrotoxic agents.  Hydrate with intravenous fluids post op.  4. COPD: Controlled; continue inhaled corticosteroid as well as Spiriva. Daily home doseprednisone. Albuterol as necessary.  5. DVT prophylaxis: Continue Eliquis postoperatively  6. GI prophylaxis: PPI per home regimen    All the records are reviewed and case discussed with Care Management/Social Workerr. Management plans discussed with the patient, family and they are in agreement.  CODE STATUS: fc  TOTAL TIME TAKING CARE OF THIS PATIENT: 36 minutes.   POSSIBLE D/C IN 2-3 DAYS, DEPENDING ON CLINICAL  CONDITION.  Note: This dictation was prepared with Dragon dictation along with smaller phrase technology. Any transcriptional errors that result from this process are unintentional.   Nicholes Mango M.D on 02/25/2016 at 6:51 PM  Between 7am to 6pm - Pager - [phone] After 6pm go to [email or web address] - password [email or web address] [facility]  Office  [phone]  CC: Primary care physician; Juluis Pitch, MD

## 2016-02-25 NOTE — Clinical Social Work Placement (Signed)
   CLINICAL SOCIAL WORK PLACEMENT  NOTE  Date:  02/25/2016  Patient Details  Name: Ana Schultz MRN: [id number] Date of Birth: Jun 05, 1925  Clinical Social Work is seeking post-discharge placement for this patient at the [place] level of care (*CSW will initial, date and re-position this form in  chart as items are completed):  Yes   Patient/family provided with [facility] Work Department's list of facilities offering this level of care within the geographic area requested by the patient (or if unable, by the patient's family).  Yes   Patient/family informed of their freedom to choose among providers that offer the needed level of care, that participate in Medicare, Medicaid or managed care program needed by the patient, have an available bed and are willing to accept the patient.  Yes   Patient/family informed of [facility]'s ownership interest in [facility] and [facility], as well as of the fact that they are under no obligation to receive care at these facilities.  PASRR submitted to EDS on 02/25/16     PASRR number received on       Existing PASRR number confirmed on       FL2 transmitted to all facilities in geographic area requested by pt/family on 02/25/16     FL2 transmitted to all facilities within larger geographic area on       Patient informed that his/her managed care company has contracts with or will negotiate with certain facilities, including the following:            Patient/family informed of bed offers received.  Patient chooses bed at       Physician recommends and patient chooses bed at      Patient to be transferred to   on  .  Patient to be transferred to facility by       Patient family notified on   of transfer.  Name of family member notified:        PHYSICIAN       Additional Comment:    _______________________________________________ Iviana Blasingame, Veronia Beets, LCSW 02/25/2016, 4:31 PM

## 2016-02-25 NOTE — ED Notes (Signed)
O2 @ 2L/min/[place] started for Sa02>90% on RA

## 2016-02-25 NOTE — H&P (Signed)
Ana Schultz is an 82 y.o. female.   Chief Complaint: Fall HPI: The patient with past medical history of stroke with subsequent left hemiparesis following aortic aneurysm repair presents to the emergency department from her rehabilitation center after suffering a fall. She admits to terrible left thigh pain. In the emergency department x-ray of the [facility] revealed nondisplaced intertrochanteric fracture of the femur. Orthopedic surgery was consulted in the emergency department staff called the hospitalist service for admission.  Past Medical History:  Diagnosis Date  . Adenoma of rectum   . Adrenal adenoma   . Anemia   . Atrophic vaginitis   . COPD (chronic obstructive pulmonary disease) ([place])   . Diverticulosis   . Dysphagia   . Frequent UTI   . Hemiplegia and hemiparesis ([place])    following cerebral infarction affecting left non-dominant side  . Microscopic hematuria   . Nonrheumatic aortic valve insufficiency   . Osteoporosis   . Paroxysmal atrial fibrillation (HCC)   . Renal cyst   . Stroke ([place])   . Urge incontinence     Past Surgical History:  Procedure Laterality Date  . ABDOMINAL HYSTERECTOMY    . AORTOILIAC BYPASS      Family History  Problem Relation Age of Onset  . Lung cancer Son   . CAD Father   . CAD Brother    Social History:  reports that she has quit smoking. She has never used smokeless tobacco. She reports that she does not drink alcohol or use drugs.  Allergies:  Allergies  Allergen Reactions  . Iodinated Diagnostic Agents Itching and Swelling  . Ciprofloxacin Other (See Comments)    Colitis  . Nitrofurantoin Diarrhea  . Solifenacin Other (See Comments)    indigestion  . Metronidazole Itching and Rash    Prior to Admission medications   Medication Sig Start Date End Date Taking? Authorizing Provider  acetaminophen (TYLENOL) 325 MG tablet Take 650 mg by mouth 4 (four) times daily.    Yes Historical Provider, MD  acetaminophen (TYLENOL) 325  MG tablet Take 650 mg by mouth every 4 (four) hours as needed for mild pain or fever.   Yes Historical Provider, MD  albuterol (PROVENTIL HFA;VENTOLIN HFA) 108 (90 Base) MCG/ACT inhaler Inhale 2 puffs into the lungs every 4 (four) hours as needed for wheezing or shortness of breath. 11/09/15  Yes Flora Lipps, MD  apixaban (ELIQUIS) 2.5 MG TABS tablet Take 2.5 mg by mouth 2 (two) times daily.   Yes Historical Provider, MD  Artificial Tear Ointment (ARTIFICIAL TEARS) ointment Place 1 application into the left eye 4 (four) times daily.   Yes Historical Provider, MD  aspirin EC 81 MG tablet Take 81 mg by mouth daily.    Yes Historical Provider, MD  atorvastatin (LIPITOR) 20 MG tablet Take 20 mg by mouth at bedtime.    Yes Historical Provider, MD  benzonatate (TESSALON) 100 MG capsule Take 100 mg by mouth every 8 (eight) hours as needed for cough.   Yes Historical Provider, MD  bisacodyl (DULCOLAX) 10 MG suppository Place 10 mg rectally daily as needed for moderate constipation.    Yes Historical Provider, MD  clonazePAM (KLONOPIN) 0.5 MG tablet Take 0.25 mg by mouth daily as needed for anxiety.    Yes Historical Provider, MD  clonazePAM (KLONOPIN) 0.5 MG tablet Take 0.25 mg by mouth daily.   Yes Historical Provider, MD  cyanocobalamin 1000 MCG tablet Take 1,000 mcg by mouth daily.   Yes Historical Provider, MD  diclofenac sodium (VOLTAREN) 1 % GEL Apply 4 g topically 4 (four) times daily as needed (pain).   Yes Historical Provider, MD  diltiazem (CARDIZEM) 60 MG tablet Take 60 mg by mouth daily.   Yes Historical Provider, MD  fluticasone-salmeterol (ADVAIR HFA) 230-21 MCG/ACT inhaler Inhale 2 puffs into the lungs 2 (two) times daily.   Yes Historical Provider, MD  furosemide (LASIX) 40 MG tablet Take 60 mg by mouth 2 (two) times daily.    Yes Historical Provider, MD  ipratropium (ATROVENT) 0.03 % nasal spray Place 2 sprays into both nostrils every 8 (eight) hours as needed for rhinitis.   Yes Historical  Provider, MD  magnesium hydroxide (MILK OF MAGNESIA) 400 MG/5ML suspension Take 30 mLs by mouth daily as needed for mild constipation.   Yes Historical Provider, MD  Melatonin 3 MG TABS Take 6 mg by mouth at bedtime.  08/10/14  Yes Historical Provider, MD  methocarbamol (ROBAXIN) 500 MG tablet Take 500 mg by mouth every 6 (six) hours as needed for muscle spasms.   Yes Historical Provider, MD  omeprazole (PRILOSEC) 40 MG capsule Take 40 mg by mouth daily.    Yes Historical Provider, MD  oxyCODONE (ROXICODONE) 5 MG immediate release tablet Take 1 tablet (5 mg total) by mouth every 8 (eight) hours as needed. Patient taking differently: Take 5 mg by mouth every 8 (eight) hours as needed for severe pain.  01/26/16 01/25/17 Yes Rudene Re, MD  polyethylene glycol [facility]) packet Take 17 g by mouth 2 (two) times daily. 02/06/16  Yes Loney Hering, MD  polyvinyl alcohol-povidone (HYPOTEARS) 1.4-0.6 % ophthalmic solution Place 1 drop into both eyes as needed (dry eyes).   Yes Historical Provider, MD  potassium chloride SA (K-DUR,KLOR-CON) 20 MEQ tablet Take 20 mEq by mouth 2 (two) times daily.    Yes Historical Provider, MD  predniSONE (DELTASONE) 20 MG tablet Take 1 tablet (20 mg total) by mouth daily with breakfast. 12/24/15  Yes Flora Lipps, MD  senna (SENOKOT) 8.6 MG TABS tablet Take 1 tablet (8.6 mg total) by mouth daily. 01/26/16  Yes Rudene Re, MD  sertraline (ZOLOFT) 50 MG tablet Take 50 mg by mouth daily.   Yes Historical Provider, MD  tiotropium (SPIRIVA) 18 MCG inhalation capsule Place 18 mcg into inhaler and inhale daily.   Yes Historical Provider, MD  traZODone (DESYREL) 50 MG tablet Take 50 mg by mouth at bedtime as needed for sleep.   Yes Historical Provider, MD  trimethoprim (TRIMPEX) 100 MG tablet Take 100 mg by mouth daily.   Yes Historical Provider, MD  fluticasone (FLOVENT HFA) 110 MCG/ACT inhaler Inhale into the lungs. 08/10/14 08/10/15  Historical Provider, MD     Results  for orders placed or performed during the hospital encounter of 02/25/16 (from the past 48 hour(s))  CBC     Status: Abnormal   Collection Time: 02/25/16  3:25 AM  Result Value Ref Range   WBC 12.9 (H) 3.6 - 11.0 K/uL   RBC 4.22 3.80 - 5.20 MIL/uL   Hemoglobin 12.2 12.0 - 16.0 g/dL   HCT 36.7 35.0 - 47.0 %   MCV 87.0 80.0 - 100.0 fL   MCH 28.9 26.0 - 34.0 pg   MCHC 33.2 32.0 - 36.0 g/dL   RDW 18.0 (H) 11.5 - 14.5 %   Platelets 203 150 - 440 K/uL  Comprehensive metabolic panel     Status: Abnormal   Collection Time: 02/25/16  3:25 AM  Result Value Ref  Range   Sodium 140 135 - 145 mmol/L   Potassium 3.6 3.5 - 5.1 mmol/L   Chloride 103 101 - 111 mmol/L   CO2 30 22 - 32 mmol/L   Glucose, Bld 113 (H) 65 - 99 mg/dL   BUN 25 (H) 6 - 20 mg/dL   Creatinine, Ser 1.23 (H) 0.44 - 1.00 mg/dL   Calcium 9.2 8.9 - 10.3 mg/dL   Total Protein 7.0 6.5 - 8.1 g/dL   Albumin 4.1 3.5 - 5.0 g/dL   AST 20 15 - 41 U/L   ALT 34 14 - 54 U/L   Alkaline Phosphatase 131 (H) 38 - 126 U/L   Total Bilirubin 0.8 0.3 - 1.2 mg/dL   GFR calc non Af Amer 38 (L) >60 mL/min   GFR calc Af Amer 44 (L) >60 mL/min    Comment: (NOTE) The eGFR has been calculated using the CKD EPI equation. This calculation has not been validated in all clinical situations. eGFR's persistently <60 mL/min signify possible Chronic Kidney Disease.    Anion gap 7 5 - 15  Protime-INR     Status: None   Collection Time: 02/25/16  3:25 AM  Result Value Ref Range   Prothrombin Time 13.5 11.4 - 15.2 seconds   INR 1.03   Urinalysis complete, with microscopic ([facility] only)     Status: Abnormal   Collection Time: 02/25/16  4:45 AM  Result Value Ref Range   Color, Urine STRAW (A) YELLOW   APPearance CLEAR (A) CLEAR   Glucose, UA NEGATIVE NEGATIVE mg/dL   Bilirubin Urine NEGATIVE NEGATIVE   Ketones, ur NEGATIVE NEGATIVE mg/dL   Specific Gravity, Urine 1.008 1.005 - 1.030   Hgb urine dipstick NEGATIVE NEGATIVE   pH 7.0 5.0 - 8.0   Protein,  ur NEGATIVE NEGATIVE mg/dL   Nitrite NEGATIVE NEGATIVE   Leukocytes, UA NEGATIVE NEGATIVE   RBC / HPF 0-5 0 - 5 RBC/hpf   WBC, UA 0-5 0 - 5 WBC/hpf   Bacteria, UA NONE SEEN NONE SEEN   Squamous Epithelial / LPF NONE SEEN NONE SEEN   Hyaline Casts, UA PRESENT    Dg Hip Unilat W Or Wo Pelvis 2-3 Views Left  Result Date: 02/25/2016 CLINICAL DATA:  Slipped and fell in bathroom. LEFT leg pain and shortening. EXAM: DG HIP (WITH OR WITHOUT PELVIS) 2-3V LEFT; LEFT FEMUR 2 VIEWS COMPARISON:  Pelvic radiograph January 26, 2016 FINDINGS: Nondisplaced LEFT femur intertrochanteric fracture with mild impaction best seen on the femoral radiograph. No dislocation. Severe osteopenia. No destructive bony lesions. Included soft tissue planes are non-suspicious. Moderate vascular calcifications. Similar mild lower lumbar compression fractures. IMPRESSION: Acute nondisplaced LEFT femur intertrochanteric fracture with impaction. No dislocation. Osteopenia, decreasing sensitivity for acute nondisplaced fractures. Electronically Signed   By: Elon Alas M.D.   On: 02/25/2016 05:25   Dg Femur Min 2 Views Left  Result Date: 02/25/2016 CLINICAL DATA:  Slipped and fell in bathroom. LEFT leg pain and shortening. EXAM: DG HIP (WITH OR WITHOUT PELVIS) 2-3V LEFT; LEFT FEMUR 2 VIEWS COMPARISON:  Pelvic radiograph January 26, 2016 FINDINGS: Nondisplaced LEFT femur intertrochanteric fracture with mild impaction best seen on the femoral radiograph. No dislocation. Severe osteopenia. No destructive bony lesions. Included soft tissue planes are non-suspicious. Moderate vascular calcifications. Similar mild lower lumbar compression fractures. IMPRESSION: Acute nondisplaced LEFT femur intertrochanteric fracture with impaction. No dislocation. Osteopenia, decreasing sensitivity for acute nondisplaced fractures. Electronically Signed   By: Elon Alas M.D.   On: 02/25/2016 05:25  Review of Systems  Constitutional:  Negative for chills and fever.  HENT: Negative for sore throat and tinnitus.   Eyes: Negative for blurred vision and redness.  Respiratory: Negative for cough and shortness of breath.   Cardiovascular: Negative for chest pain, palpitations, orthopnea and PND.  Gastrointestinal: Negative for abdominal pain, diarrhea, nausea and vomiting.  Genitourinary: Negative for dysuria, frequency and urgency.  Musculoskeletal: Positive for falls. Negative for joint pain and myalgias.  Skin: Negative for rash.       No lesions  Neurological: Negative for speech change, focal weakness and weakness.  Endo/Heme/Allergies: Does not bruise/bleed easily.       No temperature intolerance  Psychiatric/Behavioral: Negative for depression and suicidal ideas.    Blood pressure (!) 163/63, pulse 97, temperature 98.1 F (36.7 C), temperature source Oral, resp. rate (!) 31, height _0  (1.549 m), weight 54.4 kg (120 lb), SpO2 96 %. Physical Exam  Vitals reviewed. Constitutional: She is oriented to person, place, and time. She appears well-developed and well-nourished. No distress.  HENT:  Head: Normocephalic and atraumatic.  Eyes: Conjunctivae and EOM are normal. Pupils are equal, round, and reactive to light. No scleral icterus.  Neck: Normal range of motion. Neck supple. No JVD present. No tracheal deviation present. No thyromegaly present.  Cardiovascular: Normal rate, regular rhythm and normal heart sounds.  Exam reveals no gallop and no friction rub.   No murmur heard. Respiratory: Effort normal and breath sounds normal.  GI: Soft. Bowel sounds are normal. She exhibits no distension. There is no tenderness.  Genitourinary:  Genitourinary Comments: Deferred  Musculoskeletal: Normal range of motion. She exhibits no edema.  Lymphadenopathy:    She has no cervical adenopathy.  Neurological: She is alert and oriented to person, place, and time. No cranial nerve deficit. She exhibits normal muscle tone.   Skin: Skin is warm and dry. No rash noted. No erythema.  Psychiatric: She has a normal mood and affect. Her behavior is normal. Judgment and thought content normal.     Assessment/Plan This is an 82 year old female admitted for femur fracture. 1. Fracture of left femur: intertrochanteric; orthopedic service has placed preop orders. The patient is high risk for perioperative cardiovascular events given that I have held her aspirin as well as systemic anticoagulation prior to surgery. 2. Hypertension: Uncontrolled; Continue Cardizem (I suspect this is due to paroxysmal atrial fibrillation following aortic root surgery). Labetalol as needed 3. Chronic kidney disease: Stage III; likely secondary to chronic Lasix usage twice a day. I have altered her diuretic regimen to Lasix scheduled once daily. May require extra dosing as needed. Avoid nephrotoxic agents. Hydrate with intravenous fluid. 4. COPD: Controlled; continue inhaled corticosteroid as well as Spiriva. Daily prednisone. Albuterol as necessary. 5. DVT prophylaxis: Continue Eliquis postoperatively 6. GI prophylaxis: PPI per home regimen The patient is a full code. Time spent on admission orders and patient care approximately 45 minutes  Harrie Foreman, MD 02/25/2016, 7:55 AM

## 2016-02-25 NOTE — ED Triage Notes (Signed)
Pt reports that she fell around 2200 this evening. Per [facility], BP 146/80 and HR of 88. Pt denies LOC or weakness. Pt is alert and oriented with NAD at this time.

## 2016-02-25 NOTE — NC FL2 (Signed)
[place] LEVEL OF CARE SCREENING TOOL     IDENTIFICATION  Patient Name: Ana Schultz Birthdate: 28-Jul-1925 Sex: female Admission Date (Current Location): 02/25/2016  [facility] and [place] Number:  Engineering geologist and Address:  [facility], [address], [place], [place] [address]      Provider Number: [id number]  Attending Physician Name and Address:  Nicholes Mango, MD  Relative Name and Phone Number:       Current Level of Care: Hospital Recommended Level of Care: [place] Prior Approval Number:    Date Approved/Denied:   PASRR Number:    Discharge Plan: SNF    Current Diagnoses: Patient Active Problem List   Diagnosis Date Noted  . Intertrochanteric fracture of femur ([place]) 02/25/2016  . COPD exacerbation ([place]) 11/09/2015  . Dysphagia, pharyngoesophageal phase 10/10/2014  . Aneurysm ([place]) 10/09/2014  . Airway hyperreactivity 10/09/2014  . Benign essential HTN 10/09/2014  . BP (high blood pressure) 10/09/2014  . OP (osteoporosis) 10/09/2014  . [place] anemia 10/09/2014  . Tobacco abuse, in remission 08/02/2014  . Failure respiratory ([place]) 07/12/2014  . AI (aortic incompetence) 07/07/2014  . Aneurysm of ascending aorta (HCC) 07/07/2014    Orientation RESPIRATION BLADDER Height & Weight     Self, Time, Situation, Place  Normal Incontinent, Indwelling catheter Weight: 120 lb (54.4 kg) Height:  5\' 1"  (154.9 cm)  BEHAVIORAL SYMPTOMS/MOOD NEUROLOGICAL BOWEL NUTRITION STATUS   (none )  (none) Continent Diet (NPO for surgery )  AMBULATORY STATUS COMMUNICATION OF NEEDS Skin   Extensive Assist Verbally Surgical wounds                       Personal Care Assistance Level of Assistance  Bathing, Feeding, Dressing Bathing Assistance: Limited assistance Feeding assistance: Independent Dressing Assistance: Limited assistance     Functional Limitations Info  Sight, Hearing, Speech Sight Info:  Adequate Hearing Info: Adequate Speech Info: Adequate    SPECIAL CARE FACTORS FREQUENCY  PT (By licensed PT), OT (By licensed OT)     PT Frequency:  (5) OT Frequency:  (5)            Contractures      Additional Factors Info  Code Status, Allergies Code Status Info:  (Full Code. ) Allergies Info:  (Iodinated Diagnostic Agents, Ciprofloxacin, Nitrofurantoin, Solifenacin, Metronidazole)           Current Medications (02/25/2016):  This is the current hospital active medication list Current Facility-Administered Medications  Medication Dose Route Frequency Provider Last Rate Last Dose  . 0.9 %  sodium chloride infusion   Intravenous Continuous Harrie Foreman, MD 125 mL/hr at 02/25/16 1235    . acetaminophen (TYLENOL) tablet 650 mg  650 mg Oral Q4H PRN Harrie Foreman, MD      . albuterol (PROVENTIL) (2.5 MG/3ML) 0.083% nebulizer solution 2.5 mg  2.5 mg Nebulization Q4H PRN Nicholes Mango, MD      . artificial tears (LACRILUBE) ophthalmic ointment 1 application  1 application Left Eye QID Harrie Foreman, MD      . atorvastatin (LIPITOR) tablet 20 mg  20 mg Oral QHS Harrie Foreman, MD      . benzonatate (TESSALON) capsule 100 mg  100 mg Oral Q8H PRN Harrie Foreman, MD      . Derrill Memo ON 02/26/2016] ceFAZolin (ANCEF) IVPB 2g/100 mL premix  2 g Intravenous Once Earnestine Leys, MD      . Derrill Memo ON  02/26/2016] clindamycin (CLEOCIN) IVPB 600 mg  600 mg Intravenous Once Earnestine Leys, MD      . clonazePAM [facility] [place]) tablet 0.25 mg  0.25 mg Oral BID PRN Harrie Foreman, MD      . diltiazem (CARDIZEM) tablet 60 mg  60 mg Oral Daily Harrie Foreman, MD   60 mg at 02/25/16 1539  . docusate sodium (COLACE) capsule 100 mg  100 mg Oral BID Harrie Foreman, MD   100 mg at 02/25/16 1540  . fluticasone (FLONASE) 50 MCG/ACT nasal spray 2 spray  2 spray Each Nare Daily PRN Harrie Foreman, MD      . furosemide (LASIX) tablet 40 mg  40 mg Oral Daily Harrie Foreman, MD   40 mg at  02/25/16 1539  . HYDROcodone-acetaminophen (NORCO/VICODIN) 5-325 MG per tablet 1-2 tablet  1-2 tablet Oral Q6H PRN Earnestine Leys, MD   2 tablet at 02/25/16 1234  . labetalol (NORMODYNE,TRANDATE) injection 5-10 mg  5-10 mg Intravenous Q2H PRN Harrie Foreman, MD      . magnesium hydroxide (MILK OF MAGNESIA) suspension 30 mL  30 mL Oral Daily PRN Harrie Foreman, MD      . Melatonin TABS 5 mg  5 mg Oral QHS Nicholes Mango, MD      . methocarbamol (ROBAXIN) tablet 500 mg  500 mg Oral Q6H PRN Earnestine Leys, MD   500 mg at 02/25/16 1539   Or  . methocarbamol (ROBAXIN) 500 mg in dextrose 5 % 50 mL IVPB  500 mg Intravenous Q6H PRN Earnestine Leys, MD      . Derrill Memo ON 02/26/2016] methylPREDNISolone sodium succinate (SOLU-MEDROL) 125 mg/2 mL injection 125 mg  125 mg Intravenous Once Earnestine Leys, MD      . mometasone-formoterol [facility]) 200-5 MCG/ACT inhaler 2 puff  2 puff Inhalation BID Harrie Foreman, MD      . morphine 2 MG/ML injection 1 mg  1 mg Intravenous Q2H PRN Earnestine Leys, MD   1 mg at 02/25/16 1053  . ondansetron (ZOFRAN) tablet 4 mg  4 mg Oral Q6H PRN Harrie Foreman, MD       Or  . ondansetron [facility]) injection 4 mg  4 mg Intravenous Q6H PRN Harrie Foreman, MD      . oxyCODONE (Oxy IR/ROXICODONE) immediate release tablet 5 mg  5 mg Oral Q8H PRN Harrie Foreman, MD      . pantoprazole (PROTONIX) EC tablet 40 mg  40 mg Oral Daily Harrie Foreman, MD   40 mg at 02/25/16 1540  . polyethylene glycol (MIRALAX / GLYCOLAX) packet 17 g  17 g Oral BID PRN Harrie Foreman, MD      . polyvinyl alcohol (LIQUIFILM TEARS) 1.4 % ophthalmic solution 1 drop  1 drop Both Eyes PRN Harrie Foreman, MD      . potassium chloride SA (K-DUR,KLOR-CON) CR tablet 20 mEq  20 mEq Oral BID Harrie Foreman, MD   20 mEq at 02/25/16 1539  . predniSONE (DELTASONE) tablet 20 mg  20 mg Oral Q breakfast Harrie Foreman, MD   20 mg at 02/25/16 1540  . senna (SENOKOT) tablet 8.6 mg  1 tablet Oral BID Earnestine Leys, MD   8.6 mg at 02/25/16 1540  . sertraline (ZOLOFT) tablet 50 mg  50 mg Oral Daily Harrie Foreman, MD   50 mg at 02/25/16 1539  . tiotropium (SPIRIVA) inhalation capsule 18 mcg  18 mcg Inhalation  Daily Harrie Foreman, MD      . traZODone (DESYREL) tablet 50 mg  50 mg Oral QHS PRN Harrie Foreman, MD      . trimethoprim (TRIMPEX) tablet 100 mg  100 mg Oral Daily Harrie Foreman, MD      . vitamin B-12 (CYANOCOBALAMIN) tablet 1,000 mcg  1,000 mcg Oral Daily Harrie Foreman, MD   1,000 mcg at 02/25/16 1539  . zolpidem (AMBIEN) tablet 5 mg  5 mg Oral QHS PRN Earnestine Leys, MD         Discharge Medications: Please see discharge summary for a list of discharge medications.  Relevant Imaging Results:  Relevant Lab Results:   Additional Information  (SSN: [id number])  Sample, Veronia Beets, LCSW

## 2016-02-26 ENCOUNTER — Encounter: Payer: Self-pay | Admitting: *Deleted

## 2016-02-26 ENCOUNTER — Encounter
Admission: EM | Disposition: A | Discharge status: Skilled Nursing Facility | Payer: Self-pay | Source: Home / Self Care | Attending: Internal Medicine

## 2016-02-26 ENCOUNTER — Inpatient Hospital Stay: Payer: Medicare Other | Admitting: Registered Nurse

## 2016-02-26 ENCOUNTER — Inpatient Hospital Stay: Payer: Medicare Other

## 2016-02-26 DIAGNOSIS — S72002A Fracture of unspecified part of neck of left femur, initial encounter for closed fracture: Secondary | ICD-10-CM

## 2016-02-26 DIAGNOSIS — Z419 Encounter for procedure for purposes other than remedying health state, unspecified: Secondary | ICD-10-CM

## 2016-02-26 DIAGNOSIS — Z01810 Encounter for preprocedural cardiovascular examination: Secondary | ICD-10-CM

## 2016-02-26 DIAGNOSIS — I712 Thoracic aortic aneurysm, without rupture: Secondary | ICD-10-CM

## 2016-02-26 DIAGNOSIS — I480 Paroxysmal atrial fibrillation: Secondary | ICD-10-CM

## 2016-02-26 HISTORY — PX: INTRAMEDULLARY (IM) NAIL INTERTROCHANTERIC: SHX5875

## 2016-02-26 LAB — CBC
HCT: 29.6 % — ABNORMAL LOW (ref 35.0–47.0)
Hemoglobin: 9.7 g/dL — ABNORMAL LOW (ref 12.0–16.0)
MCH: 29.4 pg (ref 26.0–34.0)
MCHC: 32.8 g/dL (ref 32.0–36.0)
MCV: 89.5 fL (ref 80.0–100.0)
Platelets: 164 10*3/uL (ref 150–440)
RBC: 3.3 MIL/uL — ABNORMAL LOW (ref 3.80–5.20)
RDW: 17.7 % — ABNORMAL HIGH (ref 11.5–14.5)
WBC: 10.3 10*3/uL (ref 3.6–11.0)

## 2016-02-26 LAB — BASIC METABOLIC PANEL
Anion gap: 6 (ref 5–15)
BUN: 22 mg/dL — ABNORMAL HIGH (ref 6–20)
CO2: 27 mmol/L (ref 22–32)
Calcium: 8.2 mg/dL — ABNORMAL LOW (ref 8.9–10.3)
Chloride: 108 mmol/L (ref 101–111)
Creatinine, Ser: 1.03 mg/dL — ABNORMAL HIGH (ref 0.44–1.00)
GFR calc Af Amer: 55 mL/min — ABNORMAL LOW (ref >60)
GFR calc non Af Amer: 47 mL/min — ABNORMAL LOW (ref >60)
Glucose, Bld: 164 mg/dL — ABNORMAL HIGH (ref 65–99)
Potassium: 4.4 mmol/L (ref 3.5–5.1)
Sodium: 141 mmol/L (ref 135–145)

## 2016-02-26 LAB — HEMOGLOBIN A1C
Hgb A1c MFr Bld: 5.9 % — ABNORMAL HIGH (ref 4.8–5.6)
Mean Plasma Glucose: 123 mg/dL

## 2016-02-26 SURGERY — FIXATION, FRACTURE, INTERTROCHANTERIC, WITH INTRAMEDULLARY ROD
Anesthesia: General | Laterality: Left | Wound class: Clean

## 2016-02-26 MED ORDER — METOCLOPRAMIDE HCL 5 MG/ML IJ SOLN: 5.0000 mg | Freq: Three times a day (TID) | INTRAMUSCULAR | Status: DC | PRN

## 2016-02-26 MED ORDER — BISACODYL 10 MG RE SUPP: 10.0000 mg | Freq: Every day | RECTAL | Status: DC | PRN

## 2016-02-26 MED ORDER — CEFAZOLIN SODIUM-DEXTROSE 2-4 GM/100ML-% IV SOLN
2.0000 g | Freq: Three times a day (TID) | INTRAVENOUS | Status: AC
  Administered 2016-02-26 – 2016-02-27 (×3): 2 g via INTRAVENOUS
  Filled 2016-02-26 (×3): qty 100

## 2016-02-26 MED ORDER — EPHEDRINE SULFATE 50 MG/ML IJ SOLN
INTRAMUSCULAR | Status: DC | PRN
  Administered 2016-02-26: 15 mg via INTRAVENOUS
  Administered 2016-02-26: 10 mg via INTRAVENOUS

## 2016-02-26 MED ORDER — ONDANSETRON HCL 4 MG/2ML IJ SOLN: 4.0000 mg | Freq: Once | INTRAMUSCULAR | Status: DC | PRN

## 2016-02-26 MED ORDER — PHENOL 1.4 % MT LIQD
1.0000 | OROMUCOSAL | Status: DC | PRN
  Filled 2016-02-26: qty 177

## 2016-02-26 MED ORDER — LIDOCAINE HCL (CARDIAC) 20 MG/ML IV SOLN
INTRAVENOUS | Status: DC | PRN
  Administered 2016-02-26: 100 mg via INTRAVENOUS

## 2016-02-26 MED ORDER — PROPOFOL 10 MG/ML IV BOLUS
INTRAVENOUS | Status: DC | PRN
  Administered 2016-02-26: 100 mg via INTRAVENOUS

## 2016-02-26 MED ORDER — FLEET ENEMA 7-19 GM/118ML RE ENEM
1.0000 | ENEMA | Freq: Once | RECTAL | Status: AC | PRN
  Administered 2016-02-28: 1 via RECTAL

## 2016-02-26 MED ORDER — FENTANYL CITRATE (PF) 100 MCG/2ML IJ SOLN
INTRAMUSCULAR | Status: DC | PRN
  Administered 2016-02-26 (×2): 50 ug via INTRAVENOUS
  Administered 2016-02-26: 100 ug via INTRAVENOUS

## 2016-02-26 MED ORDER — LACTATED RINGERS IV SOLN
INTRAVENOUS | Status: DC | PRN
  Administered 2016-02-26: 13:00:00 via INTRAVENOUS

## 2016-02-26 MED ORDER — HYDROCODONE-ACETAMINOPHEN 5-325 MG PO TABS
1.0000 | ORAL_TABLET | Freq: Four times a day (QID) | ORAL | Status: DC | PRN
  Administered 2016-02-27: 1 via ORAL
  Administered 2016-02-28 – 2016-02-29 (×4): 2 via ORAL
  Filled 2016-02-26 (×3): qty 2
  Filled 2016-02-26: qty 1
  Filled 2016-02-26: qty 2

## 2016-02-26 MED ORDER — CEFAZOLIN SODIUM-DEXTROSE 2-4 GM/100ML-% IV SOLN
INTRAVENOUS | Status: AC
  Filled 2016-02-26: qty 100

## 2016-02-26 MED ORDER — NEOMYCIN-POLYMYXIN B GU 40-200000 IR SOLN
Status: DC | PRN
  Administered 2016-02-26: 2 mL

## 2016-02-26 MED ORDER — APIXABAN 2.5 MG PO TABS
2.5000 mg | ORAL_TABLET | Freq: Two times a day (BID) | ORAL | Status: DC
  Administered 2016-02-27: 2.5 mg via ORAL
  Filled 2016-02-26: qty 1

## 2016-02-26 MED ORDER — ALUM & MAG HYDROXIDE-SIMETH 200-200-20 MG/5ML PO SUSP
30.0000 mL | ORAL | Status: DC | PRN
  Administered 2016-02-28: 30 mL via ORAL
  Filled 2016-02-26: qty 30

## 2016-02-26 MED ORDER — ROCURONIUM BROMIDE 100 MG/10ML IV SOLN
INTRAVENOUS | Status: DC | PRN
  Administered 2016-02-26: 30 mg via INTRAVENOUS

## 2016-02-26 MED ORDER — FERROUS SULFATE 325 (65 FE) MG PO TABS
325.0000 mg | ORAL_TABLET | Freq: Every day | ORAL | Status: DC
  Administered 2016-02-27 – 2016-03-01 (×4): 325 mg via ORAL
  Filled 2016-02-26 (×4): qty 1

## 2016-02-26 MED ORDER — MENTHOL 3 MG MT LOZG
1.0000 | LOZENGE | OROMUCOSAL | Status: DC | PRN
Start: 2016-02-26 — End: 2016-03-01
  Filled 2016-02-26: qty 9

## 2016-02-26 MED ORDER — METOCLOPRAMIDE HCL 10 MG PO TABS
5.0000 mg | ORAL_TABLET | Freq: Three times a day (TID) | ORAL | Status: DC | PRN
  Administered 2016-02-28: 10 mg via ORAL
  Filled 2016-02-26: qty 1

## 2016-02-26 MED ORDER — BUPIVACAINE-EPINEPHRINE (PF) 0.5% -1:200000 IJ SOLN
INTRAMUSCULAR | Status: DC | PRN
  Administered 2016-02-26: 30 mL

## 2016-02-26 MED ORDER — PHENYLEPHRINE HCL 10 MG/ML IJ SOLN
INTRAMUSCULAR | Status: DC | PRN
  Administered 2016-02-26: 50 ug via INTRAVENOUS

## 2016-02-26 MED ORDER — CLINDAMYCIN PHOSPHATE 600 MG/50ML IV SOLN
600.0000 mg | Freq: Three times a day (TID) | INTRAVENOUS | Status: AC
  Administered 2016-02-26 – 2016-02-27 (×3): 600 mg via INTRAVENOUS
  Filled 2016-02-26 (×3): qty 50

## 2016-02-26 MED ORDER — MORPHINE SULFATE (PF) 2 MG/ML IV SOLN: 0.5000 mg | INTRAVENOUS | Status: DC | PRN

## 2016-02-26 MED ORDER — SUGAMMADEX SODIUM 500 MG/5ML IV SOLN
INTRAVENOUS | Status: DC | PRN
Start: 2016-02-26 — End: 2016-02-26
  Administered 2016-02-26: 118.2 mg via INTRAVENOUS

## 2016-02-26 MED ORDER — DEXAMETHASONE SODIUM PHOSPHATE 10 MG/ML IJ SOLN
INTRAMUSCULAR | Status: DC | PRN
  Administered 2016-02-26: 5 mg via INTRAVENOUS

## 2016-02-26 MED ORDER — SODIUM CHLORIDE 0.45 % IV SOLN
INTRAVENOUS | Status: DC
  Administered 2016-02-26: 16:00:00 via INTRAVENOUS

## 2016-02-26 MED ORDER — ONDANSETRON HCL 4 MG/2ML IJ SOLN
INTRAMUSCULAR | Status: DC | PRN
  Administered 2016-02-26: 4 mg via INTRAVENOUS

## 2016-02-26 MED ORDER — ACETAMINOPHEN 500 MG PO TABS
1000.0000 mg | ORAL_TABLET | Freq: Four times a day (QID) | ORAL | Status: AC
  Administered 2016-02-26 – 2016-02-27 (×4): 1000 mg via ORAL
  Filled 2016-02-26 (×4): qty 2

## 2016-02-26 MED ORDER — FENTANYL CITRATE (PF) 100 MCG/2ML IJ SOLN: 25.0000 ug | INTRAMUSCULAR | Status: DC | PRN

## 2016-02-26 SURGICAL SUPPLY — 37 items
BIT DRILL SPINE 4.0MMX260 (BIT) ×1
BIT DRILL SPINE 4.0X260 (BIT) ×2 IMPLANT
BLADE HELICAL 90 (Orthopedic Implant) ×2 IMPLANT
BLADE HELICAL 90MM (Orthopedic Implant) ×1 IMPLANT
BNDG COHESIVE 4X5 TAN STRL (GAUZE/BANDAGES/DRESSINGS) ×3 IMPLANT
CANISTER SUCT 1200ML W/VALVE (MISCELLANEOUS) ×3 IMPLANT
CHLORAPREP W/TINT 26ML (MISCELLANEOUS) ×6 IMPLANT
DRAPE C-ARMOR (DRAPES) IMPLANT
DRAPE INCISE 23X17 IOBAN STRL (DRAPES) ×2
DRAPE INCISE IOBAN 23X17 STRL (DRAPES) ×1 IMPLANT
DRSG AQUACEL AG ADV 3.5X10 (GAUZE/BANDAGES/DRESSINGS) IMPLANT
DRSG AQUACEL AG ADV 3.5X14 (GAUZE/BANDAGES/DRESSINGS) IMPLANT
ELECT REM PT RETURN 9FT ADLT (ELECTROSURGICAL) ×3
ELECTRODE REM PT RTRN 9FT ADLT (ELECTROSURGICAL) ×1 IMPLANT
GAUZE PETRO XEROFOAM 1X8 (MISCELLANEOUS) IMPLANT
GAUZE SPONGE 4X4 12PLY STRL (GAUZE/BANDAGES/DRESSINGS) ×3 IMPLANT
GLOVE INDICATOR 8.0 STRL GRN (GLOVE) ×3 IMPLANT
GLOVE SURG ORTHO 8.5 STRL (GLOVE) ×3 IMPLANT
GOWN STRL REUS W/ TWL LRG LVL3 (GOWN DISPOSABLE) ×2 IMPLANT
GOWN STRL REUS W/TWL LRG LVL3 (GOWN DISPOSABLE) ×4
GUIDEWIRE 3.2X400 (WIRE) ×6 IMPLANT
KIT RM TURNOVER STRD PROC AR (KITS) ×3 IMPLANT
MAT BLUE FLOOR 46X72 FLO (MISCELLANEOUS) ×3 IMPLANT
NAIL TROCH FX 11/130D 170-S (Nail) ×3 IMPLANT
NEEDLE SPNL 18GX3.5 QUINCKE PK (NEEDLE) ×3 IMPLANT
NS IRRIG 500ML POUR BTL (IV SOLUTION) ×3 IMPLANT
PACK HIP COMPR (MISCELLANEOUS) ×3 IMPLANT
REAMER ROD DEEP FLUTE 2.5X950 (INSTRUMENTS) IMPLANT
SCREW LOCK TI 5.0X36 F/IM NAIL (Screw) ×3 IMPLANT
STAPLER SKIN PROX 35W (STAPLE) ×3 IMPLANT
SUCTION FRAZIER HANDLE 10FR (MISCELLANEOUS) ×2
SUCTION TUBE FRAZIER 10FR DISP (MISCELLANEOUS) ×1 IMPLANT
SUT BONE WAX W31G (SUTURE) ×3 IMPLANT
SUT VIC AB 0 CT1 36 (SUTURE) ×3 IMPLANT
SUT VIC AB 2-0 CT1 27 (SUTURE) ×2
SUT VIC AB 2-0 CT1 TAPERPNT 27 (SUTURE) ×1 IMPLANT
SYR 30ML LL (SYRINGE) ×3 IMPLANT

## 2016-02-26 NOTE — Anesthesia Preprocedure Evaluation (Signed)
Anesthesia Evaluation  Patient identified by MRN, date of birth, ID band Patient awake    Reviewed: Allergy & Precautions, H&P , NPO status , Patient's Chart, lab work & pertinent test results, reviewed documented beta blocker date and time   History of Anesthesia Complications Negative for: history of anesthetic complications  Airway Mallampati: III  TM Distance: >3 FB Neck ROM: full    Dental  (+) Poor Dentition, Missing   Pulmonary shortness of breath and with exertion, asthma , neg sleep apnea, COPD,  COPD inhaler, neg recent URI, former smoker,    breath sounds clear to auscultation + decreased breath sounds      Cardiovascular Exercise Tolerance: Good hypertension, (-) angina+ Peripheral Vascular Disease  (-) CAD, (-) Past MI, (-) Cardiac Stents and (-) CABG (-) dysrhythmias + Valvular Problems/Murmurs AI      Neuro/Psych neg Seizures CVA, Residual Symptoms negative psych ROS   GI/Hepatic negative GI ROS, Neg liver ROS,   Endo/Other  negative endocrine ROS  Renal/GU Renal disease  negative genitourinary   Musculoskeletal   Abdominal   Peds  Hematology  (+) Blood dyscrasia, anemia ,   Anesthesia Other Findings Past Medical History: No date: Adenoma of rectum No date: Adrenal adenoma No date: Anemia No date: Atrophic vaginitis No date: COPD (chronic obstructive pulmonary disease) (* No date: Diverticulosis No date: Dysphagia No date: Frequent UTI No date: Hemiplegia and hemiparesis (HCC)     Comment: following cerebral infarction affecting left               non-dominant side No date: Microscopic hematuria No date: Nonrheumatic aortic valve insufficiency No date: Osteoporosis No date: Paroxysmal atrial fibrillation (HCC) No date: Renal cyst No date: Stroke [facility]) No date: Urge incontinence   Reproductive/Obstetrics negative OB ROS                             Anesthesia  Physical Anesthesia Plan  ASA: III  Anesthesia Plan: General   Post-op Pain Management:    Induction:   Airway Management Planned:   Additional Equipment:   Intra-op Plan:   Post-operative Plan:   Informed Consent: I have reviewed the patients History and Physical, chart, labs and discussed the procedure including the risks, benefits and alternatives for the proposed anesthesia with the patient or authorized representative who has indicated his/her understanding and acceptance.   Dental Advisory Given  Plan Discussed with: Anesthesiologist, CRNA and Surgeon  Anesthesia Plan Comments:         Anesthesia Quick Evaluation

## 2016-02-26 NOTE — H&P (Signed)
THE PATIENT WAS SEEN PRIOR TO SURGERY TODAY.  HISTORY, ALLERGIES, HOME MEDICATIONS AND OPERATIVE PROCEDURE WERE REVIEWED. RISKS AND BENEFITS OF SURGERY DISCUSSED WITH PATIENT AGAIN.  NO CHANGES FROM INITIAL HISTORY AND PHYSICAL NOTED.    

## 2016-02-26 NOTE — Anesthesia Procedure Notes (Signed)
Procedure Name: Intubation Date/Time: 02/26/2016 1:22 PM Performed by: Doreen Salvage Pre-anesthesia Checklist: Patient identified, Patient being monitored, Timeout performed, Emergency Drugs available and Suction available Patient Re-evaluated:Patient Re-evaluated prior to inductionOxygen Delivery Method: Circle system utilized Preoxygenation: Pre-oxygenation with 100% oxygen Intubation Type: IV induction Ventilation: Mask ventilation without difficulty Laryngoscope Size: Mac and 3 Grade View: Grade I Tube type: Oral Tube size: 7.0 mm Number of attempts: 1 Airway Equipment and Method: Stylet Placement Confirmation: ETT inserted through vocal cords under direct vision,  positive ETCO2 and breath sounds checked- equal and bilateral Secured at: 21 cm Tube secured with: Tape Dental Injury: Teeth and Oropharynx as per pre-operative assessment

## 2016-02-26 NOTE — Progress Notes (Signed)
Clinical Education officer, museum (CSW) presented bed offers to patient. She chose [facility] CSW left patient's daughter [name] a voicemail making her aware of above. [facility] admissions coordinator at [facility] is aware of accepted bed offer. CSW will continue to follow and assist as needed.   [employer], LCSW [phone]

## 2016-02-26 NOTE — Progress Notes (Signed)
[facility] NAME: Ana Schultz    MR#:  [id number]  DATE OF BIRTH:  09/28/1925  SUBJECTIVE:  CHIEF COMPLAINT: pts pain is well controlled, no other complaints.Family bed side  History of aortic aneurysm in the past  REVIEW OF SYSTEMS:  CONSTITUTIONAL: No fever, fatigue or weakness.  EYES: No blurred or double vision.  EARS, NOSE, AND THROAT: No tinnitus or ear pain.  RESPIRATORY: No cough, shortness of breath, wheezing or hemoptysis.  CARDIOVASCULAR: No chest pain, orthopnea, edema.  GASTROINTESTINAL: No nausea, vomiting, diarrhea or abdominal pain.  GENITOURINARY: No dysuria, hematuria.  ENDOCRINE: No polyuria, nocturia,  HEMATOLOGY: No anemia, easy bruising or bleeding SKIN: No rash or lesion. MUSCULOSKELETAL: lt hip joint pain    NEUROLOGIC: No tingling, numbness, weakness.  PSYCHIATRY: No anxiety or depression.   DRUG ALLERGIES:   Allergies  Allergen Reactions  . Iodinated Diagnostic Agents Itching and Swelling  . Ciprofloxacin Other (See Comments)    Colitis  . Nitrofurantoin Diarrhea  . Solifenacin Other (See Comments)    indigestion  . Metronidazole Itching and Rash    VITALS:  Blood pressure (!) 160/47, pulse 87, temperature 99 F (37.2 C), resp. rate 19, height 5\' 1"  (1.549 m), weight 59.1 kg (130 lb 6.4 oz), SpO2 95 %.  PHYSICAL EXAMINATION:  GENERAL:  82 y.o.-year-old patient lying in the bed with no acute distress.  EYES: Pupils equal, round, reactive to light and accommodation. No scleral icterus. Extraocular muscles intact.  HEENT: Head atraumatic, normocephalic. Oropharynx and nasopharynx clear.  NECK:  Supple, no jugular venous distention. No thyroid enlargement, no tenderness.  LUNGS: Normal breath sounds bilaterally, no wheezing, rales,rhonchi or crepitation. No use of accessory muscles of respiration.  CARDIOVASCULAR: S1, S2 normal. No murmurs, rubs, or gallops.  ABDOMEN: Soft,  nontender, nondistended. Bowel sounds present. No organomegaly or mass.  EXTREMITIES: No pedal edema, cyanosis, or clubbing.  NEUROLOGIC: Cranial nerves II through XII are intact. Muscle strength 5/5 in all extremities except lt hip-trnder,int rotated. Sensation intact. Gait not checked.  PSYCHIATRIC: The patient is alert and oriented x 3.  SKIN: No obvious rash, lesion, or ulcer.    LABORATORY PANEL:   CBC  Recent Labs Lab 02/26/16 0314  WBC 10.3  HGB 9.7*  HCT 29.6*  PLT 164   ------------------------------------------------------------------------------------------------------------------  Chemistries   Recent Labs Lab 02/25/16 1243 02/26/16 0314  NA 140 141  K 3.9 4.4  CL 103 108  CO2 29 27  GLUCOSE 128* 164*  BUN 24* 22*  CREATININE 1.12* 1.03*  CALCIUM 8.7* 8.2*  AST 19  --   ALT 32  --   ALKPHOS 110  --   BILITOT 1.1  --    ------------------------------------------------------------------------------------------------------------------  Cardiac Enzymes No results for input(s): TROPONINI in the last 168 hours. ------------------------------------------------------------------------------------------------------------------  RADIOLOGY:  Dg Hip Operative Unilat W Or W/o Pelvis Left  Result Date: 02/26/2016 CLINICAL DATA:  ORIF intertrochanteric left femoral neck fracture with intramedullary nail. EXAM: OPERATIVE LEFT HIP (WITH PELVIS IF PERFORMED) 2 VIEWS TECHNIQUE: Fluoroscopic spot image(s) were submitted for interpretation post-operatively. COMPARISON:  02/25/2016. FINDINGS: Multiple spot images obtained at various intervals throughout the ORIF of the left intertrochanteric femoral neck fracture were obtained. The completion images demonstrate an intramedullary nail and compression screw traversing the intertrochanteric fracture. Alignment appears anatomic on the spot images. IMPRESSION: ORIF of the nondisplaced intertrochanteric left femoral neck fracture.  Electronically Signed   By: Sherran Needs.D.  On: 02/26/2016 15:23   Dg Hip Unilat W Or Wo Pelvis 2-3 Views Left  Result Date: 02/25/2016 CLINICAL DATA:  Slipped and fell in bathroom. LEFT leg pain and shortening. EXAM: DG HIP (WITH OR WITHOUT PELVIS) 2-3V LEFT; LEFT FEMUR 2 VIEWS COMPARISON:  Pelvic radiograph January 26, 2016 FINDINGS: Nondisplaced LEFT femur intertrochanteric fracture with mild impaction best seen on the femoral radiograph. No dislocation. Severe osteopenia. No destructive bony lesions. Included soft tissue planes are non-suspicious. Moderate vascular calcifications. Similar mild lower lumbar compression fractures. IMPRESSION: Acute nondisplaced LEFT femur intertrochanteric fracture with impaction. No dislocation. Osteopenia, decreasing sensitivity for acute nondisplaced fractures. Electronically Signed   By: Elon Alas M.D.   On: 02/25/2016 05:25   Dg Femur Min 2 Views Left  Result Date: 02/25/2016 CLINICAL DATA:  Slipped and fell in bathroom. LEFT leg pain and shortening. EXAM: DG HIP (WITH OR WITHOUT PELVIS) 2-3V LEFT; LEFT FEMUR 2 VIEWS COMPARISON:  Pelvic radiograph January 26, 2016 FINDINGS: Nondisplaced LEFT femur intertrochanteric fracture with mild impaction best seen on the femoral radiograph. No dislocation. Severe osteopenia. No destructive bony lesions. Included soft tissue planes are non-suspicious. Moderate vascular calcifications. Similar mild lower lumbar compression fractures. IMPRESSION: Acute nondisplaced LEFT femur intertrochanteric fracture with impaction. No dislocation. Osteopenia, decreasing sensitivity for acute nondisplaced fractures. Electronically Signed   By: Elon Alas M.D.   On: 02/25/2016 05:25    EKG:   Orders placed or performed during the hospital encounter of 02/25/16  . EKG 12-Lead  . EKG 12-Lead    ASSESSMENT AND PLAN:   This is an 82 year old female admitted for femur fracture.  1.Acute intertrochanteric  fracture of left  NPO after midnight for surgery today Pain management by ortho dr.Miller Holdng her aspirin as well as systemic anticoagulation prior to surgery. Cardiology Dr. Rockey Situ has cleared the patient from cardiac standpoint for surgery  2. Hypertension: better today Continue Cardizem  . Labetalol as needed  3. Chronic kidney disease: Stage III;  resume Lasix scheduled bid  Avoid nephrotoxic agents.  Hydrate with intravenous fluids post op.  4. COPD: Controlled; continue inhaled corticosteroid as well as Spiriva. Daily home doseprednisone. Albuterol as necessary.  5. History of ascending aortic aneurysm status post repair in March 2016 and history of paroxysmal atrial fibrillation Patient was seen and cleared by cardiology for surgery Cardiac he has committed to continue her home medications  6. DVT prophylaxis: Continue Eliquis postoperatively  6. GI prophylaxis: PPI per home regimen    All the records are reviewed and case discussed with Care Management/Social Workerr. Management plans discussed with the patient, family and they are in agreement.  CODE STATUS: fc  TOTAL TIME TAKING CARE OF THIS PATIENT: 34 minutes.   POSSIBLE D/C IN 2-3 DAYS, DEPENDING ON CLINICAL CONDITION.  Note: This dictation was prepared with Dragon dictation along with smaller phrase technology. Any transcriptional errors that result from this process are unintentional.   Nicholes Mango M.D on 02/26/2016 at 3:35 PM  Between 7am to 6pm - Pager - [phone] After 6pm go to [email or web address] - password [email or web address] [email or web address]  Office  [phone]  CC: Primary care physician; Juluis Pitch, MD

## 2016-02-26 NOTE — Op Note (Signed)
DATE OF SURGERY:  02/26/2016  TIME: 2:38 PM  PATIENT NAME:  Ana Schultz  AGE: 82 y.o.  PRE-OPERATIVE DIAGNOSIS:  left hip fracture displaced intertrochanteric left  POST-OPERATIVE DIAGNOSIS:  SAME  PROCEDURE:  INTRAMEDULLARY (IM) NAIL INTERTROCHANTRIC with TFN  SURGEON:  Britny Riel E  EBL:  50 cc  COMPLICATIONS:  NONE  OPERATIVE IMPLANTS: Synthes trochanteric femoral nail  130*/11MM  with interlocking helical blade  90 mm and distal locking screw  36 mm.  PREOPERATIVE INDICATIONS:  Ana Schultz is a 82 y.o. year old who fell and suffered a hip fracture. She was brought into the ER and then admitted and optimized and then elected for surgical intervention.    The risks benefits and alternatives were discussed with the patient including but not limited to the risks of nonoperative treatment, versus surgical intervention including infection, bleeding, nerve injury, malunion, nonunion, hardware prominence, hardware failure, need for hardware removal, blood clots, cardiopulmonary complications, morbidity, mortality, among others, and they were willing to proceed.    OPERATIVE PROCEDURE:  The patient was brought to the operating room and placed in the supine position.  General endotracheal anesthesia was administered, with a foley. She was placed on the fracture table.  Closed reduction was performed under C-arm guidance. The length of the femur was also measured using fluoroscopy. Time out was then performed after sterile prep and drape. She received preoperative antibiotics.  Incision was made proximal to the greater trochanter. A guidewire was placed in the appropriate position. Confirmation was made on AP and lateral views. The above-named nail was opened. I opened the proximal femur with a reamer. I then placed the nail by hand easily down. I did not need to ream the femur.  Once the nail was completely seated, I placed a guidepin into the femoral head into the center  center position through a second incision.  I measured the length, and then reamed the lateral cortex and up into the head. I then placed the helical blade. Slight compression was applied. Anatomic fixation achieved. Bone quality was mediocre.  I then secured the proximal interlock.  The distal locking screw was then placed and after confirming the position of the fracture fragments and hardware I then removed the instruments, and took final C-arm pictures AP and lateral the entire length of the leg. Anatomic reconstruction was achieved, and the wounds were irrigated copiously and closed with Vicryl  followed by staples and Aquacel for the skin. Sponge and needle count were correct.   The patient was awakened and returned to PACU in stable and satisfactory condition. There no complications and the patient tolerated the procedure well.  She will be partial weightbearing as tolerated, and will be on Lovenox  For DVT prophylaxis.     Park Breed, M.D.

## 2016-02-26 NOTE — Transfer of Care (Signed)
Immediate Anesthesia Transfer of Care Note  Patient: Ana Schultz  Procedure(s) Performed: Procedure(s): INTRAMEDULLARY (IM) NAIL INTERTROCHANTRIC (Left)  Patient Location: PACU  Anesthesia Type:General  Level of Consciousness: sedated  Airway & Oxygen Therapy: Patient Spontanous Breathing and Patient connected to face mask oxygen  Post-op Assessment: Report given to RN and Post -op Vital signs reviewed and stable  Post vital signs: Reviewed and stable  Last Vitals:  Vitals:   02/26/16 1216 02/26/16 1445  BP: (!) 132/54 (!) 167/68  Pulse: 76 92  Resp: 16 (!) 25  Temp: (!) 36.1 C (!) [id number] C    Complications: No apparent anesthesia complications

## 2016-02-26 NOTE — Consult Note (Signed)
Cardiology Consultation Note  Patient ID: Ana Schultz, MRN: [id number], DOB/AGE: [birth year] 82 y.o. Admit date: 02/25/2016   Date of Consult: 02/26/2016 Primary Physician: Juluis Pitch, MD Primary Cardiologist: [facility] Requesting Physician: Dr. Margaretmary Eddy, MD  Chief Complaint: Mechanical fall leading to fractured hip Reason for Consult: pre-operative evaluation  HPI: 82 y.o. female with h/o ascending aortic aneurysm and hypertension. The patient underwent replacement of ascending aorta and hemi-arch under deep hypothermic circulatory arrest in 07/2014 after having a LHC in 06/2014 that showed no obstructive CAD. 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 Deficits included left sided weakness in arm and leg with persistent left upper extremity loss of function. She was reintubated at the time of the stroke and had a prolonged ICU stay with a 3rd reintubation for respiratory failure before eventually leaving the ICU on bipap. The patient has a history of chronic UTIs. During hospitalization she had UCx drawn on 3/21 that were positive for pseudomonas, Morganella morganii and was treated with a 2 week course of Cefepime. She also had post-operative afib and aflutter intermittently during her post-operative course that was managed with diltiazem 60 mg q6h. Her last known episode was 07/23/14. Due to the stroke she was not able to take in adequate nutrition orally; and a PEG tube was placed on 08/08/2014. Initially anticoagulation was held due to planned PEG, but was initiated on 08/10/14. This should be titrated as needed to maintain INR 2-3 as an outpatient for at least 3-6 months. Patient was discharged to a skilled nursing facility on 08/10/2014.  Patient was seen in [facility] on 10/05/2014. She was scheduled for a thoracentesis following that appointment. She underwent the procedure and had 382mL removed from left  pleural space. Patient presents to followup with repeat CXR. Her daughter is with her. At her last follow up she reported significant improvement in SOB and lower extremity edema over the past month. Since this time seh has done well from a cardiac standpoint with any acute issues.   Patient had to use the restroom urgently on 10/23. Her sitter helped her to the toilet, though she did not make it to the toilet and the patient voided in the floor. She then slipped on the wet floor suffering a fractured left hip. She is for the OR today. Never with any chest pain, SOB, diaphoresis, dizziness, presyncope, or syncope. Labs noted.     Past Medical History:  Diagnosis Date  . Adenoma of rectum   . Adrenal adenoma   . Anemia   . Atrophic vaginitis   . COPD (chronic obstructive pulmonary disease) ([place])   . Diverticulosis   . Dysphagia   . Frequent UTI   . Hemiplegia and hemiparesis ([place])    following cerebral infarction affecting left non-dominant side  . Microscopic hematuria   . Nonrheumatic aortic valve insufficiency   . Osteoporosis   . Paroxysmal atrial fibrillation ([facility])   . Renal cyst   . Stroke ([place])   . Urge incontinence       Most Recent Cardiac Studies: Cardiac cath 06/2014: SUMMARY FINDINGS: Mild Non-ObstructiveCoronary ArteryDisease  Non-obstructivecoronaryartery diseasewith ostial LMCAand ostialRCA narrowing thatis likely dueto altered geometry ratherthan from atheroscleroticcoronaryartery disease.This was discussedwith theCT surgery team. Left ventricleend-diastolicpressure iselevated. (LVEDP= 22 mmHg) The axillaryartery appearsnormal and withoutany significant angiographicstenosis.  Echo 07/2014: Interpretation:  Clinical Diagnosesand EchocardiographicFindings  Aorticroot replacement Left ventricularhypertrophy Normalleft ventricularejection  0000000) Diastolicleft ventriculardysfunction Degenerativemitral valvedisease  Mitralannular calcification Dilatedleft atrium Aorticsclerosis Aorticregurgitation (eccentric,probablymoderate) Contractileright ventriculardysfunction (mild) Tricuspidregurgitation(mild to moderate) Dilatedright atrium   Surgical History:  Past Surgical History:  Procedure Laterality Date  . ABDOMINAL HYSTERECTOMY    . AORTOILIAC BYPASS       Home Meds: Prior to Admission medications   Medication Sig Start Date End Date Taking? Authorizing Provider  acetaminophen (TYLENOL) 325 MG tablet Take 650 mg by mouth 4 (four) times daily.    Yes Historical Provider, MD  acetaminophen (TYLENOL) 325 MG tablet Take 650 mg by mouth every 4 (four) hours as needed for mild pain or fever.   Yes Historical Provider, MD  albuterol (PROVENTIL HFA;VENTOLIN HFA) 108 (90 Base) MCG/ACT inhaler Inhale 2 puffs into the lungs every 4 (four) hours as needed for wheezing or shortness of breath. 11/09/15  Yes Flora Lipps, MD  apixaban (ELIQUIS) 2.5 MG TABS tablet Take 2.5 mg by mouth 2 (two) times daily.   Yes Historical Provider, MD  Artificial Tear Ointment (ARTIFICIAL TEARS) ointment Place 1 application into the left eye 4 (four) times daily.   Yes Historical Provider, MD  aspirin EC 81 MG tablet Take 81 mg by mouth daily.    Yes Historical Provider, MD  atorvastatin (LIPITOR) 20 MG tablet Take 20 mg by mouth at bedtime.    Yes Historical Provider, MD  benzonatate (TESSALON) 100 MG capsule Take 100 mg by mouth every 8 (eight) hours as needed for cough.   Yes Historical Provider, MD  bisacodyl (DULCOLAX) 10 MG suppository Place 10 mg rectally daily as needed for moderate constipation.    Yes Historical Provider, MD  clonazePAM (KLONOPIN) 0.5 MG tablet Take 0.25 mg by mouth daily as needed for anxiety.    Yes Historical Provider, MD  clonazePAM (KLONOPIN) 0.5 MG  tablet Take 0.25 mg by mouth daily.   Yes Historical Provider, MD  cyanocobalamin 1000 MCG tablet Take 1,000 mcg by mouth daily.   Yes Historical Provider, MD  diclofenac sodium (VOLTAREN) 1 % GEL Apply 4 g topically 4 (four) times daily as needed (pain).   Yes Historical Provider, MD  diltiazem (CARDIZEM) 60 MG tablet Take 60 mg by mouth daily.   Yes Historical Provider, MD  fluticasone-salmeterol (ADVAIR HFA) 230-21 MCG/ACT inhaler Inhale 2 puffs into the lungs 2 (two) times daily.   Yes Historical Provider, MD  furosemide (LASIX) 40 MG tablet Take 60 mg by mouth 2 (two) times daily.    Yes Historical Provider, MD  ipratropium (ATROVENT) 0.03 % nasal spray Place 2 sprays into both nostrils every 8 (eight) hours as needed for rhinitis.   Yes Historical Provider, MD  magnesium hydroxide (MILK OF MAGNESIA) 400 MG/5ML suspension Take 30 mLs by mouth daily as needed for mild constipation.   Yes Historical Provider, MD  Melatonin 3 MG TABS Take 6 mg by mouth at bedtime.  08/10/14  Yes Historical Provider, MD  methocarbamol (ROBAXIN) 500 MG tablet Take 500 mg by mouth every 6 (six) hours as needed for muscle spasms.   Yes Historical Provider, MD  omeprazole (PRILOSEC) 40 MG capsule Take 40 mg by mouth daily.    Yes Historical Provider, MD  oxyCODONE (ROXICODONE) 5 MG immediate release tablet Take 1 tablet (5 mg total) by mouth every 8 (eight) hours as needed. Patient taking differently: Take 5 mg by mouth every 8 (eight) hours as needed for severe pain.  01/26/16 01/25/17 Yes Rudene Re, MD  polyethylene glycol [facility]) packet Take 17 g by mouth 2 (two) times daily.  02/06/16  Yes Loney Hering, MD  polyvinyl alcohol-povidone (HYPOTEARS) 1.4-0.6 % ophthalmic solution Place 1 drop into both eyes as needed (dry eyes).   Yes Historical Provider, MD  potassium chloride SA (K-DUR,KLOR-CON) 20 MEQ tablet Take 20 mEq by mouth 2 (two) times daily.    Yes Historical Provider, MD  predniSONE (DELTASONE) 20 MG  tablet Take 1 tablet (20 mg total) by mouth daily with breakfast. 12/24/15  Yes Flora Lipps, MD  senna (SENOKOT) 8.6 MG TABS tablet Take 1 tablet (8.6 mg total) by mouth daily. 01/26/16  Yes Rudene Re, MD  sertraline (ZOLOFT) 50 MG tablet Take 50 mg by mouth daily.   Yes Historical Provider, MD  tiotropium (SPIRIVA) 18 MCG inhalation capsule Place 18 mcg into inhaler and inhale daily.   Yes Historical Provider, MD  traZODone (DESYREL) 50 MG tablet Take 50 mg by mouth at bedtime as needed for sleep.   Yes Historical Provider, MD  trimethoprim (TRIMPEX) 100 MG tablet Take 100 mg by mouth daily.   Yes Historical Provider, MD  fluticasone (FLOVENT HFA) 110 MCG/ACT inhaler Inhale into the lungs. 08/10/14 08/10/15  Historical Provider, MD    Inpatient Medications:  . artificial tears  1 application Left Eye QID  . atorvastatin  20 mg Oral QHS  .  ceFAZolin (ANCEF) IV  2 g Intravenous Once  . clindamycin (CLEOCIN) IV  600 mg Intravenous Once  . diltiazem  60 mg Oral Daily  . docusate sodium  100 mg Oral BID  . furosemide  60 mg Oral BID  . Melatonin  5 mg Oral QHS  . methylPREDNISolone (SOLU-MEDROL) injection  125 mg Intravenous Once  . mometasone-formoterol  2 puff Inhalation BID  . pantoprazole  40 mg Oral Daily  . potassium chloride SA  20 mEq Oral BID  . predniSONE  20 mg Oral Q breakfast  . senna  1 tablet Oral BID  . sertraline  50 mg Oral Daily  . tiotropium  18 mcg Inhalation Daily  . trimethoprim  100 mg Oral Daily  . cyanocobalamin  1,000 mcg Oral Daily   . sodium chloride 125 mL/hr at 02/26/16 0501    Allergies:  Allergies  Allergen Reactions  . Iodinated Diagnostic Agents Itching and Swelling  . Ciprofloxacin Other (See Comments)    Colitis  . Nitrofurantoin Diarrhea  . Solifenacin Other (See Comments)    indigestion  . Metronidazole Itching and Rash    Social History   Social History  . Marital status: Widowed    Spouse name: N/A  . Number of children: N/A  .  Years of education: N/A   Occupational History  . Not on file.   Social History Main Topics  . Smoking status: Former Research scientist (life sciences)  . Smokeless tobacco: Never Used  . Alcohol use No  . Drug use: No  . Sexual activity: Not on file   Other Topics Concern  . Not on file   Social History Narrative  . No narrative on file     Family History  Problem Relation Age of Onset  . Lung cancer Son   . CAD Father   . CAD Brother      Review of Systems: Review of Systems  Constitutional: Positive for malaise/fatigue. Negative for chills, diaphoresis, fever and weight loss.  HENT: Negative for congestion.   Eyes: Negative for discharge and redness.  Respiratory: Negative for cough, hemoptysis, sputum production, shortness of breath and wheezing.   Cardiovascular: Negative for chest pain, palpitations,  orthopnea, claudication, leg swelling and PND.  Gastrointestinal: Negative for abdominal pain, blood in stool, heartburn, melena, nausea and vomiting.  Genitourinary: Negative for hematuria.  Musculoskeletal: Positive for back pain, falls, joint pain and myalgias.  Skin: Negative for rash.  Neurological: Negative for dizziness, tingling, tremors, sensory change, speech change, focal weakness, loss of consciousness and weakness.  Endo/Heme/Allergies: Does not bruise/bleed easily.  Psychiatric/Behavioral: Negative for substance abuse. The patient is not nervous/anxious.   All other systems reviewed and are negative.   Labs: No results for input(s): CKTOTAL, CKMB, TROPONINI in the last 72 hours. Lab Results  Component Value Date   WBC 10.3 02/26/2016   HGB 9.7 (L) 02/26/2016   HCT 29.6 (L) 02/26/2016   MCV 89.5 02/26/2016   PLT 164 02/26/2016    Recent Labs Lab 02/25/16 1243 02/26/16 0314  NA 140 141  K 3.9 4.4  CL 103 108  CO2 29 27  BUN 24* 22*  CREATININE 1.12* 1.03*  CALCIUM 8.7* 8.2*  PROT 6.1*  --   BILITOT 1.1  --   ALKPHOS 110  --   ALT 32  --   AST 19  --   GLUCOSE  128* 164*   Lab Results  Component Value Date   CHOL 127 02/09/2015   HDL 72 02/09/2015   LDLCALC 49 02/09/2015   TRIG 28 02/09/2015   No results found for: DDIMER  Radiology/Studies:  Dg Hip Unilat W Or Wo Pelvis 2-3 Views Left  Result Date: 02/25/2016 CLINICAL DATA:  Slipped and fell in bathroom. LEFT leg pain and shortening. EXAM: DG HIP (WITH OR WITHOUT PELVIS) 2-3V LEFT; LEFT FEMUR 2 VIEWS COMPARISON:  Pelvic radiograph January 26, 2016 FINDINGS: Nondisplaced LEFT femur intertrochanteric fracture with mild impaction best seen on the femoral radiograph. No dislocation. Severe osteopenia. No destructive bony lesions. Included soft tissue planes are non-suspicious. Moderate vascular calcifications. Similar mild lower lumbar compression fractures. IMPRESSION: Acute nondisplaced LEFT femur intertrochanteric fracture with impaction. No dislocation. Osteopenia, decreasing sensitivity for acute nondisplaced fractures. Electronically Signed   By: Elon Alas M.D.   On: 02/25/2016 05:25   Dg Femur Min 2 Views Left  Result Date: 02/25/2016 CLINICAL DATA:  Slipped and fell in bathroom. LEFT leg pain and shortening. EXAM: DG HIP (WITH OR WITHOUT PELVIS) 2-3V LEFT; LEFT FEMUR 2 VIEWS COMPARISON:  Pelvic radiograph January 26, 2016 FINDINGS: Nondisplaced LEFT femur intertrochanteric fracture with mild impaction best seen on the femoral radiograph. No dislocation. Severe osteopenia. No destructive bony lesions. Included soft tissue planes are non-suspicious. Moderate vascular calcifications. Similar mild lower lumbar compression fractures. IMPRESSION: Acute nondisplaced LEFT femur intertrochanteric fracture with impaction. No dislocation. Osteopenia, decreasing sensitivity for acute nondisplaced fractures. Electronically Signed   By: Elon Alas M.D.   On: 02/25/2016 05:25    EKG: Interpreted by me showed: NSR, 77 bpm, LVH, PACs, nonspecific lateral st/t changes   Weights: Filed  Weights   02/25/16 0314 02/26/16 0429  Weight: 120 lb (54.4 kg) 130 lb 6.4 oz (59.1 kg)     Physical Exam: Blood pressure (!) 117/49, pulse 70, temperature 98.7 F (37.1 C), temperature source Oral, resp. rate 18, height 5\' 1"  (1.549 m), weight 130 lb 6.4 oz (59.1 kg), SpO2 96 %. Body mass index is 24.64 kg/m. General: Well developed, well nourished, in no acute distress. Head: Normocephalic, atraumatic, sclera non-icteric, no xanthomas, nares are without discharge.  Neck: Negative for carotid bruits. JVD not elevated. Lungs: Clear bilaterally to auscultation without wheezes, rales, or rhonchi.  Breathing is unlabored. Heart: RRR with S1 S2. No murmurs, rubs, or gallops appreciated. Abdomen: Soft, non-tender, non-distended with normoactive bowel sounds. No hepatomegaly. No rebound/guarding. No obvious abdominal masses. Msk:  Strength and tone appear normal for age. Extremities: No clubbing or cyanosis. No edema.  Neuro: Alert and oriented X 3. No facial asymmetry. No focal deficit. Moves all extremities spontaneously. Psych:  Responds to questions appropriately with a normal affect.    Assessment and Plan:  Active Problems:   Intertrochanteric fracture of femur ([place])    1. Cardiac pre-operative evaluation: -No symptoms suggestive of any ischemia -No indication for any cardiac procedure at this time to hold up required surgery -Normal EF as above -Limit peri-operative fluids, would decrease to 75 mL/hr if ok with surgery   2. Mechanical fall leading to fractured left hip: -Per ortho  3. History of ascending aortic aneurysm s/p repair in 07/2014: -Stable -Continue home medications and follow up with primary cardiologist  4. History of PAF/flutter: -In the post operative setting -Limit IV fluids to minimize cardiac stretch -Continue home medications -Currently in sinus rhythm  -Not on long term full-dose anticoagulation  5. Prior stroke: -Residual loss of function of left  arm with 50% function of left leg -PT evaluation per primary team    Signed, Christell Faith, PA-C [place] Pager: [phone] 02/26/2016, 10:24 AM

## 2016-02-27 ENCOUNTER — Encounter: Payer: Self-pay | Admitting: Specialist

## 2016-02-27 ENCOUNTER — Encounter
Admission: RE | Admit: 2016-02-27 | Discharge: 2016-02-27 | Disposition: A | Discharge status: Home / Self Care | Payer: Medicare Other | Source: Ambulatory Visit | Attending: Internal Medicine | Admitting: Internal Medicine

## 2016-02-27 LAB — BASIC METABOLIC PANEL
Anion gap: 6 (ref 5–15)
BUN: 21 mg/dL — AB (ref 6–20)
CHLORIDE: 107 mmol/L (ref 101–111)
CO2: 25 mmol/L (ref 22–32)
CREATININE: 1.07 mg/dL — AB (ref 0.44–1.00)
Calcium: 8.3 mg/dL — ABNORMAL LOW (ref 8.9–10.3)
GFR calc non Af Amer: 45 mL/min — ABNORMAL LOW (ref >60)
GFR, EST AFRICAN AMERICAN: 52 mL/min — AB (ref >60)
GLUCOSE: 137 mg/dL — AB (ref 65–99)
Potassium: 4 mmol/L (ref 3.5–5.1)
Sodium: 138 mmol/L (ref 135–145)

## 2016-02-27 LAB — CBC
HEMATOCRIT: 24.9 % — AB (ref 35.0–47.0)
HEMOGLOBIN: 8.2 g/dL — AB (ref 12.0–16.0)
MCH: 29 pg (ref 26.0–34.0)
MCHC: 32.7 g/dL (ref 32.0–36.0)
MCV: 88.6 fL (ref 80.0–100.0)
Platelets: 141 10*3/uL — ABNORMAL LOW (ref 150–440)
RBC: 2.81 MIL/uL — ABNORMAL LOW (ref 3.80–5.20)
RDW: 17.2 % — ABNORMAL HIGH (ref 11.5–14.5)
WBC: 10.6 10*3/uL (ref 3.6–11.0)

## 2016-02-27 MED ORDER — APIXABAN 5 MG PO TABS
5.0000 mg | ORAL_TABLET | Freq: Two times a day (BID) | ORAL | Status: DC
  Administered 2016-02-27 – 2016-02-28 (×2): 5 mg via ORAL
  Filled 2016-02-27 (×2): qty 1

## 2016-02-27 NOTE — Progress Notes (Signed)
This was a follow-up visit with the patient whom the chaplain had met before. The patient was in good spirit and had been waiting to chaplain to visit her again. When chaplain arrived, the patient asked chaplain why he had not come to visit her yesterday. Chaplain explain why he did come and patient was satisfied. Patient asked for prayers and spiritual support, which the chaplain provided.   02/27/16 1600  Clinical Encounter Type  Visited With Patient  Visit Type Follow-up;Spiritual support  Spiritual Encounters  Spiritual Needs Prayer     02/27/16 1600  Clinical Encounter Type  Visited With Patient  Visit Type Follow-up;Spiritual support  Spiritual Encounters  Spiritual Needs Prayer     02/27/16 1600  Clinical Encounter Type  Visited With Patient  Visit Type Follow-up;Spiritual support  Spiritual Encounters  Spiritual Needs Prayer

## 2016-02-27 NOTE — Anesthesia Postprocedure Evaluation (Signed)
Anesthesia Post Note  Patient: Ana Schultz  Procedure(s) Performed: Procedure(s) (LRB): INTRAMEDULLARY (IM) NAIL INTERTROCHANTRIC (Left)  Patient location during evaluation: PACU Anesthesia Type: General Level of consciousness: awake and alert Pain management: pain level controlled Vital Signs Assessment: post-procedure vital signs reviewed and stable Respiratory status: spontaneous breathing, nonlabored ventilation, respiratory function stable and patient connected to nasal cannula oxygen Cardiovascular status: blood pressure returned to baseline and stable Postop Assessment: no signs of nausea or vomiting Anesthetic complications: no    Last Vitals:  Vitals:   02/27/16 0327 02/27/16 0744  BP: (!) 106/38 (!) 148/54  Pulse: 74 75  Resp: 19   Temp: 37 C 36.7 C    Last Pain:  Vitals:   02/27/16 0744  TempSrc: Oral  PainSc:                  Martha Clan

## 2016-02-27 NOTE — Progress Notes (Signed)
Plan is for patient to D/C to [facility] tomorrow or Friday pending medical clearance. [facility] admissions coordinator at [facility] is aware of above. Clinical Social Worker (CSW) will continue to follow and assist as needed.   [employer], LCSW [phone]

## 2016-02-27 NOTE — Progress Notes (Signed)
Spoke with cardiology and based on patient's weight (63.7 kg) and SCr (1.07) recommended increasing PTA apixaban 2.5 mg bid to apixaban 5 mg bid. Per cardiology, patient was originally on lower dose while in AKI. With renal function now improved, cardiology agreed to increase apixaban dose and will recheck BMET one week after discharge.   Darrow Bussing, PharmD Pharmacy Resident 02/27/2016 3:41 PM

## 2016-02-27 NOTE — Progress Notes (Signed)
Subjective: 1 Day Post-Op Procedure(s) (LRB): INTRAMEDULLARY (IM) NAIL INTERTROCHANTRIC (Left)    Patient reports pain as mild. Patient alert and oriented. Feels well today.  Has been out of bed. Will watch hemaglobin.  Objective:   VITALS:   Vitals:   02/27/16 0327 02/27/16 0744  BP: (!) 106/38 (!) 148/54  Pulse: 74 75  Resp: 19   Temp: 98.6 F (37 C) 98 F (36.7 C)    Neurologically intact ABD soft Neurovascular intact Sensation intact distally Intact pulses distally Dorsiflexion/Plantar flexion intact Incision: scant drainage  LABS  Recent Labs  02/25/16 1243 02/26/16 0314 02/27/16 0415  HGB 11.1* 9.7* 8.2*  HCT 33.2* 29.6* 24.9*  WBC 11.4* 10.3 10.6  PLT 177 164 141*     Recent Labs  02/25/16 1243 02/26/16 0314 02/27/16 0415  NA 140 141 138  K 3.9 4.4 4.0  BUN 24* 22* 21*  CREATININE 1.12* 1.03* 1.07*  GLUCOSE 128* 164* 137*     Recent Labs  02/25/16 0325 02/25/16 1243  INR 1.03 1.05     Assessment/Plan: 1 Day Post-Op Procedure(s) (LRB): INTRAMEDULLARY (IM) NAIL INTERTROCHANTRIC (Left)   Advance diet Up with therapy D/C IV fluids Discharge to SNF

## 2016-02-27 NOTE — Progress Notes (Signed)
Physical Therapy Treatment Patient Details Name: Ana Schultz MRN: [id number] DOB: 06-10-1925 Today's Date: 02/27/2016    History of Present Illness Pt. is an 82 y.o. who was admitted to [facility] with a Left Hip Fracture.    PT Comments    Pt presented with slightly improved control of LLE in standing during the PM session compared to the morning session but continues to have difficulty extending the L hip and knee to neutral for weight bearing and requires assistance to achieve L foot flat on floor.  Once foot is flat on the floor pt can weight shift slightly onto LLE but is unable to keep knee from buckling.  Pt required min A with bed mobility and CGA with transfers from elevated EOB.  Sensation to light touch and proprioception grossly intact to BLEs.  Pt will benefit from PT services to address deficits in strength, gait, balance, mobility, and transfers for decreased caregiver assistance upon discharge.   Follow Up Recommendations  SNF     Equipment Recommendations  None recommended by PT    Recommendations for Other Services       Precautions / Restrictions Precautions Precautions: Fall Restrictions Weight Bearing Restrictions: Yes LLE Weight Bearing: Partial weight bearing    Mobility  Bed Mobility Overal bed mobility: Needs Assistance Bed Mobility: Supine to Sit;Sit to Supine     Supine to sit: Min assist Sit to supine: Min assist      Transfers Overall transfer level: Needs assistance Equipment used: Left platform walker Transfers: Sit to/from Stand Sit to Stand: Min guard         General transfer comment: Pt performs sit to stand from elevated EOB using RUE and RLE only with total A to place LUE onto platform walker once standing.  Ambulation/Gait Ambulation/Gait assistance: Mod assist Ambulation Distance (Feet): 1 Feet Assistive device: Left platform walker Gait Pattern/deviations: Step-to pattern     General Gait Details: Pt unable to place  weight through LLE without leg buckling    Stairs            Wheelchair Mobility    Modified Rankin (Stroke Patients Only)       Balance Overall balance assessment: Needs assistance Sitting-balance support: Bilateral upper extremity supported Sitting balance-Leahy Scale: Good     Standing balance support: Bilateral upper extremity supported Standing balance-Leahy Scale: Fair                      Cognition Arousal/Alertness: Awake/alert Behavior During Therapy: WFL for tasks assessed/performed Overall Cognitive Status: Within Functional Limits for tasks assessed                      Exercises Total Joint Exercises Ankle Circles/Pumps: Strengthening;Both;20 reps Quad Sets: AAROM;Left;Other reps (comment) (4 x 20 reps) Gluteal Sets: AROM;Both;20 reps Short Arc QuadSinclair Ship;Left;20 reps Heel Slides: AAROM;Left;20 reps Hip ABduction/ADduction: AAROM;Left;20 reps Straight Leg Raises: AAROM;Left;20 reps Long Arc Quad: AAROM;Left;Strengthening;Right;20 reps (AAROM on L, strengthening on R) Knee Flexion: AAROM;Strengthening;Right;Left;20 reps (AAROM on L, strengthening on R) Bridges: AROM;10 reps;15 reps Other Exercises Other Exercises: Static balance training with BUE support with gentle weight shifting onto LLE with PWB status maintained. Other Exercises: Standing TKE on L with AAROM 3 x 10; seated R hip flex 2 x 10 with manual resistance. Other Exercises: HEP program education review for compliance and technique    General Comments        Pertinent Vitals/Pain Pain Assessment: No/denies pain Pain Score:  0-No pain    Home Living Family/patient expects to be discharged to:: [place]: Wheelchair - manual      Prior Function Level of Independence: Needs assistance  Gait / Transfers Assistance Needed: CGA ADL's / Homemaking Assistance Needed: Pt. requires assist for ADL tasks from the staff     PT  Goals (current goals can now be found in the care plan section) Acute Rehab PT Goals Patient Stated Goal: To walk again PT Goal Formulation: With patient Time For Goal Achievement: 03/11/16 Potential to Achieve Goals: Fair Progress towards PT goals: Progressing toward goals    Frequency    BID      PT Plan Current plan remains appropriate    Co-evaluation             End of Session Equipment Utilized During Treatment: Gait belt;Oxygen Activity Tolerance: Patient tolerated treatment well Patient left: in bed;with bed alarm set;with nursing/sitter in room;with call bell/phone within reach (Nursing in room at end of session to change pt's gown and linens, nsg to apply SCDs)     Time: 1440-1520 PT Time Calculation (min) (ACUTE ONLY): 40 min  Charges:  $Therapeutic Exercise: 23-37 mins $Therapeutic Activity: 8-22 mins                    G Codes:      DRoyetta Asal PT, DPT 02/27/16, 3:33 PM

## 2016-02-27 NOTE — Progress Notes (Signed)
[facility] NAME: Ana Schultz    MR#:  [id number]  DATE OF BIRTH:  11-Aug-1925  SUBJECTIVE:  CHIEF COMPLAINT: pts pain is well controlled, no other complaints.Tolerating diet. No bowel movement yet  REVIEW OF SYSTEMS:  CONSTITUTIONAL: No fever, fatigue or weakness.  EYES: No blurred or double vision.  EARS, NOSE, AND THROAT: No tinnitus or ear pain.  RESPIRATORY: No cough, shortness of breath, wheezing or hemoptysis.  CARDIOVASCULAR: No chest pain, orthopnea, edema.  GASTROINTESTINAL: No nausea, vomiting, diarrhea or abdominal pain.  GENITOURINARY: No dysuria, hematuria.  ENDOCRINE: No polyuria, nocturia,  HEMATOLOGY: No anemia, easy bruising or bleeding SKIN: No rash or lesion. MUSCULOSKELETAL: lt hip joint pain Status post surgery NEUROLOGIC: No tingling, numbness, weakness.  PSYCHIATRY: No anxiety or depression.   DRUG ALLERGIES:   Allergies  Allergen Reactions  . Iodinated Diagnostic Agents Itching and Swelling  . Ciprofloxacin Other (See Comments)    Colitis  . Nitrofurantoin Diarrhea  . Solifenacin Other (See Comments)    indigestion  . Metronidazole Itching and Rash    VITALS:  Blood pressure (!) 148/54, pulse 75, temperature 98 F (36.7 C), temperature source Oral, resp. rate 19, height 5\' 1"  (1.549 m), weight 63.7 kg (140 lb 6.4 oz), SpO2 100 %.  PHYSICAL EXAMINATION:  GENERAL:  82 y.o.-year-old patient lying in the bed with no acute distress.  EYES: Pupils equal, round, reactive to light and accommodation. No scleral icterus. Extraocular muscles intact.  HEENT: Head atraumatic, normocephalic. Oropharynx and nasopharynx clear.  NECK:  Supple, no jugular venous distention. No thyroid enlargement, no tenderness.  LUNGS: Normal breath sounds bilaterally, no wheezing, rales,rhonchi or crepitation. No use of accessory muscles of respiration.  CARDIOVASCULAR: S1, S2 normal. No murmurs, rubs, or gallops.   ABDOMEN: Soft, nontender, nondistended. Bowel sounds present. No organomegaly or mass.  EXTREMITIES: Left hip joint status post surgery No pedal edema, cyanosis, or clubbing.  NEUROLOGIC: Cranial nerves II through XII are intact. Muscle strength 5/5 in all extremities except lt hip-trnder,int rotated. Sensation intact. Gait not checked.  PSYCHIATRIC: The patient is alert and oriented x 3.  SKIN: No obvious rash, lesion, or ulcer.    LABORATORY PANEL:   CBC  Recent Labs Lab 02/27/16 0415  WBC 10.6  HGB 8.2*  HCT 24.9*  PLT 141*   ------------------------------------------------------------------------------------------------------------------  Chemistries   Recent Labs Lab 02/25/16 1243  02/27/16 0415  NA 140  < > 138  K 3.9  < > 4.0  CL 103  < > 107  CO2 29  < > 25  GLUCOSE 128*  < > 137*  BUN 24*  < > 21*  CREATININE 1.12*  < > 1.07*  CALCIUM 8.7*  < > 8.3*  AST 19  --   --   ALT 32  --   --   ALKPHOS 110  --   --   BILITOT 1.1  --   --   < > = values in this interval not displayed. ------------------------------------------------------------------------------------------------------------------  Cardiac Enzymes No results for input(s): TROPONINI in the last 168 hours. ------------------------------------------------------------------------------------------------------------------  RADIOLOGY:  Dg Hip Operative Unilat W Or W/o Pelvis Left  Result Date: 02/26/2016 CLINICAL DATA:  ORIF intertrochanteric left femoral neck fracture with intramedullary nail. EXAM: OPERATIVE LEFT HIP (WITH PELVIS IF PERFORMED) 2 VIEWS TECHNIQUE: Fluoroscopic spot image(s) were submitted for interpretation post-operatively. COMPARISON:  02/25/2016. FINDINGS: Multiple spot images obtained at various intervals throughout the ORIF of the left  intertrochanteric femoral neck fracture were obtained. The completion images demonstrate an intramedullary nail and compression screw traversing the  intertrochanteric fracture. Alignment appears anatomic on the spot images. IMPRESSION: ORIF of the nondisplaced intertrochanteric left femoral neck fracture. Electronically Signed   By: Evangeline Dakin M.D.   On: 02/26/2016 15:23    EKG:   Orders placed or performed during the hospital encounter of 02/25/16  . EKG 12-Lead  . EKG 12-Lead    ASSESSMENT AND PLAN:   This is an 82 year old female admitted for femur fracture.  1.Acute intertrochanteric fracture of left pod#1 Clinically doing fine Pain management by ortho dr.Miller Resume her aspirin as well as systemic anticoagulation prior to surgery. Cardiology Dr. Rockey Situ has cleared the patient from cardiac standpoint for surgery  2. Hypertension: better today Continue Cardizem  . Labetalol as needed  3. Chronic kidney disease: Stage III;  resume Lasix scheduled bid  Avoid nephrotoxic agents.  Hydrate with intravenous fluids post op.  4. COPD: Controlled; continue inhaled corticosteroid as well as Spiriva. Daily home doseprednisone. Albuterol as necessary.  5. History of ascending aortic aneurysm status post repair in March 2016 and history of paroxysmal atrial fibrillation Patient was seen and cleared by cardiology for surgery Cardiac he has committed to continue her home medications  6. DVT prophylaxis: Continue Eliquis postoperatively. Pharmacy will discuss with Dr. Candis Musa regarding the dose adjustment from 2.5-5 mg o  6. GI prophylaxis: PPI per home regimen  It is recommending skilled nursing facility  All the records are reviewed and case discussed with Care Management/Social Workerr. Management plans discussed with the patient, family and they are in agreement.  CODE STATUS: fc  TOTAL TIME TAKING CARE OF THIS PATIENT: 34 minutes.   POSSIBLE D/C IN am  DAYS, DEPENDING ON CLINICAL CONDITION.  Note: This dictation was prepared with Dragon dictation along with smaller phrase technology. Any transcriptional errors that  result from this process are unintentional.   Nicholes Mango M.D on 02/27/2016 at 2:14 PM  Between 7am to 6pm - Pager - [phone] After 6pm go to [email or web address] - password [email or web address] [facility]  Office  [phone]  CC: Primary care physician; Juluis Pitch, MD

## 2016-02-27 NOTE — Evaluation (Signed)
Physical Therapy Evaluation Patient Details Name: Ana Schultz MRN: [id number] DOB: 04/17/1926 Today's Date: 02/27/2016   History of Present Illness  Pt. is an 82 y.o. who was admitted to [facility] with a Left Hip Fracture s/p fall at ALF.  Pt is s/p LLE intertrochanteric IM nail.    Clinical Impression  Pt presents with deficits in strength, transfers, mobility, gait, balance, and activity tolerance.  Pt has a h/o CVA from 2016 with residual flaccid LUE and weakness in LLE.  Pt required min A with bed mobility and sit to/from stand transfers with total assist to place LUE onto platform walker once standing.  Once in standing pt's LLE presented in position of slight hip flex and knee flex with pt unable to get L foot onto floor independently.  Pt required mod A to place LLE into neutral position with foot on the floor and was able to take one step with mod verbal cues for PWB status but step was mostly hop-to secondary to weakness on LLE with L knee buckling.  Pt will benefit from PT services to address above deficits for decreased caregiver assistance upon discharge.      Follow Up Recommendations SNF    Equipment Recommendations  None recommended by PT    Recommendations for Other Services       Precautions / Restrictions Precautions Precautions: Fall Restrictions Weight Bearing Restrictions: Yes LLE Weight Bearing: Partial weight bearing      Mobility  Bed Mobility Overal bed mobility: Needs Assistance Bed Mobility: Supine to Sit;Sit to Supine     Supine to sit: Min assist Sit to supine: Min assist      Transfers Overall transfer level: Needs assistance Equipment used: Left platform walker Transfers: Sit to/from Stand Sit to Stand: Min guard         General transfer comment: Pt performs sit to stand from elevated EOB using RUE and RLE only with total A to place LUE onto platform walker once standing.  Ambulation/Gait Ambulation/Gait assistance: +2 Mod  assist Ambulation Distance (Feet): 1 Feet Assistive device: Left platform walker Gait Pattern/deviations: Step-to pattern     General Gait Details: Pt unable to place weight through LLE without leg buckling   Stairs            Wheelchair Mobility    Modified Rankin (Stroke Patients Only)       Balance Overall balance assessment: Needs assistance   Sitting balance-Leahy Scale: Good     Standing balance support: Bilateral upper extremity supported Standing balance-Leahy Scale: Fair                               Pertinent Vitals/Pain Pain Assessment: No/denies pain    Home Living Family/patient expects to be discharged to:: Assisted living               Home Equipment: Other (comment) (LUE platform walker)      Prior Function Level of Independence: Needs assistance   Gait / Transfers Assistance Needed: CGA  ADL's / Homemaking Assistance Needed: Assist from staff with ADLs        Hand Dominance   Dominant Hand: Right    Extremity/Trunk Assessment   Upper Extremity Assessment: LUE deficits/detail       LUE Deficits / Details: Flaccid   Lower Extremity Assessment: LLE deficits/detail   LLE Deficits / Details: Trace hip flex and knee ext, grossly 2/5 knee flex and ankle DF  Communication   Communication: No difficulties  Cognition Arousal/Alertness: Awake/alert Behavior During Therapy: WFL for tasks assessed/performed Overall Cognitive Status: Within Functional Limits for tasks assessed                      General Comments      Exercises Total Joint Exercises Ankle Circles/Pumps: AROM;Both;Other reps (comment) (2 x 10 reps) Quad Sets: AROM;Both;Other reps (comment) (2 x 10 reps) Gluteal Sets: AROM;Both;Other reps (comment) (2 x 10 reps) Hip ABduction/ADduction: AAROM;Left;5 reps Straight Leg Raises: AAROM;Left;5 reps Long Arc Quad: AAROM;Left;Other reps (comment) (2 x 10 reps) Knee Flexion: AROM;Left;Other  reps (comment) (2 x 10 reps) Other Exercises Other Exercises: HEP education/review for BLE APs, QS, and GS x 10 each 8-10x/day   Assessment/Plan    PT Assessment Patient needs continued PT services  PT Problem List Decreased strength;Decreased activity tolerance;Decreased balance;Decreased mobility;Decreased knowledge of use of DME          PT Treatment Interventions DME instruction;Gait training;Functional mobility training;Therapeutic activities;Therapeutic exercise;Balance training;Neuromuscular re-education;Patient/family education    PT Goals (Current goals can be found in the Care Plan section)  Acute Rehab PT Goals Patient Stated Goal: To walk again PT Goal Formulation: With patient Time For Goal Achievement: 03/11/16 Potential to Achieve Goals: Fair    Frequency 7X/week   Barriers to discharge        Co-evaluation               End of Session Equipment Utilized During Treatment: Gait belt;Oxygen Activity Tolerance: Patient tolerated treatment well Patient left: in bed;with bed alarm set;with SCD's reapplied;with call bell/phone within reach           Time: 0950-1031 PT Time Calculation (min) (ACUTE ONLY): 41 min   Charges:   PT Evaluation $PT Eval Low Complexity: 1 Procedure PT Treatments $Therapeutic Exercise: 8-22 mins   PT G Codes:        DRoyetta Asal PT, DPT 02/27/16, 12:00 PM

## 2016-02-27 NOTE — Evaluation (Signed)
Occupational Therapy Evaluation Patient Details Name: Ana Schultz MRN: [id number] DOB: [birth year] Today's Date: 02/27/2016    History of Present Illness Pt. is an 82 y.o. Female who was admitted to [facility] with a Left Hip Fracture.   Clinical Impression   Pt. Is an 82 y.o. Female who was admitted to [facility] with a left hip fracture. Pt. Underwent IM nailing repair. Pt. Presents with weakness, impaired LUE functional use from a previous CVA, and limited functional mobility which hinder her ability to complete ADL tasks. Pt. Could benefit from skilled OT services for ADL training, positioning, and LUE neuro-muscular re-ed in order to return work towards improving ADLs, and IADLs.    Follow Up Recommendations  SNF    Equipment Recommendations       Recommendations for Other Services       Precautions / Restrictions Precautions Precautions: Fall Restrictions Weight Bearing Restrictions: Yes LLE Weight Bearing: Partial weight bearing                   Balance Overall balance assessment: Needs assistance   Sitting balance-Leahy Scale: Good     Standing balance support: Bilateral upper extremity supported Standing balance-Leahy Scale: Fair                              ADL Overall ADL's : Needs assistance/impaired Eating/Feeding: Set up;Minimal assistance (Pt. requires complete set-up of items for meals.)   Grooming: Set up;Minimal assistance               Lower Body Dressing: Total assistance                 General ADL Comments: Pt. uses her right hand to complete light ADL tasks with complete set up. Pt. is dependent for LE ADLs.     Vision     Perception     Praxis      Pertinent Vitals/Pain Pain Assessment: 0-10 Pain Score: 0-No pain     Hand Dominance Right   Extremity/Trunk Assessment Upper Extremity Assessment Upper Extremity Assessment: LUE deficits/detail LUE Deficits / Details: Flaccid       Communication  Communication Communication: No difficulties   Cognition Arousal/Alertness: Awake/alert Behavior During Therapy: WFL for tasks assessed/performed Overall Cognitive Status: Within Functional Limits for tasks assessed                     General Comments       Exercises      Shoulder Instructions      Home Living Family/patient expects to be discharged to:: Skilled nursing facility                             Home Equipment: Wheelchair - manual          Prior Functioning/Environment Level of Independence: Needs assistance  Gait / Transfers Assistance Needed: CGA ADL's / Homemaking Assistance Needed: Pt. requires assist for ADL tasks from the staff            OT Problem List: Decreased strength;Impaired UE functional use;Decreased activity tolerance;Pain;Decreased safety awareness;Decreased knowledge of precautions;Decreased coordination;Decreased knowledge of use of DME or AE   OT Treatment/Interventions: Self-care/ADL training;Therapeutic exercise;DME and/or AE instruction;Neuromuscular education;Therapeutic activities;Patient/family education    OT Goals(Current goals can be found in the care plan section) Acute Rehab OT Goals Patient Stated Goal: To walk again  OT Frequency:  Min 1X/week   Barriers to D/C:            Co-evaluation              End of Session    Activity Tolerance: Patient tolerated treatment well Patient left: with call bell/phone within reach;in bed;with bed alarm set   Time: 1115-1145 OT Time Calculation (min): 30 min Charges:  OT General Charges $OT Visit: 1 Procedure OT Evaluation $OT Eval Moderate Complexity: 1 Procedure OT Treatments $Self Care/Home Management : 8-22 mins G-Codes:    Harrel Carina, MS, OT/L 02/27/2016, 12:11 PM

## 2016-02-28 LAB — HEMOGLOBIN AND HEMATOCRIT, BLOOD
HEMATOCRIT: 29.7 % — AB (ref 35.0–47.0)
Hemoglobin: 9.8 g/dL — ABNORMAL LOW (ref 12.0–16.0)

## 2016-02-28 LAB — BASIC METABOLIC PANEL
ANION GAP: 8 (ref 5–15)
BUN: 27 mg/dL — ABNORMAL HIGH (ref 6–20)
CHLORIDE: 105 mmol/L (ref 101–111)
CO2: 27 mmol/L (ref 22–32)
Calcium: 8.3 mg/dL — ABNORMAL LOW (ref 8.9–10.3)
Creatinine, Ser: 1.01 mg/dL — ABNORMAL HIGH (ref 0.44–1.00)
GFR calc non Af Amer: 48 mL/min — ABNORMAL LOW (ref >60)
GFR, EST AFRICAN AMERICAN: 56 mL/min — AB (ref >60)
Glucose, Bld: 112 mg/dL — ABNORMAL HIGH (ref 65–99)
POTASSIUM: 3.5 mmol/L (ref 3.5–5.1)
SODIUM: 140 mmol/L (ref 135–145)

## 2016-02-28 LAB — CBC
HEMATOCRIT: 23.5 % — AB (ref 35.0–47.0)
HEMOGLOBIN: 7.7 g/dL — AB (ref 12.0–16.0)
MCH: 28.5 pg (ref 26.0–34.0)
MCHC: 32.6 g/dL (ref 32.0–36.0)
MCV: 87.5 fL (ref 80.0–100.0)
Platelets: 156 10*3/uL (ref 150–440)
RBC: 2.69 MIL/uL — AB (ref 3.80–5.20)
RDW: 17.5 % — ABNORMAL HIGH (ref 11.5–14.5)
WBC: 9 10*3/uL (ref 3.6–11.0)

## 2016-02-28 LAB — OCCULT BLOOD X 1 CARD TO LAB, STOOL: FECAL OCCULT BLD: NEGATIVE

## 2016-02-28 LAB — TYPE AND SCREEN
ABO/RH(D): O POS
Antibody Screen: NEGATIVE

## 2016-02-28 LAB — PREPARE RBC (CROSSMATCH)

## 2016-02-28 LAB — ABO/RH: ABO/RH(D): O POS

## 2016-02-28 MED ORDER — SODIUM CHLORIDE 0.9 % IV SOLN: Freq: Once | INTRAVENOUS | Status: DC

## 2016-02-28 NOTE — Progress Notes (Signed)
Physical Therapy Treatment Patient Details Name: Ana Schultz MRN: [id number] DOB: [birth year] Today's Date: 02/28/2016    History of Present Illness Pt. is an 82 y.o. who was admitted to [facility] with a Left Hip Fracture.    PT Comments    Pt agreeable to PT; reports 8/10 pain in Left lower extremity with movement and no pain at rest. Nursing removes O2 at the beginning of session. Pt with min A to sit up in bed and mod A to scoot to edge. Pt continues to require assist on left for exercises; noting at baseline pt could straighten knee and flex hip prior to recent break/surgery. Right ankle DF at baseline is limited due to previous stroke, but pt states she could clear to ambulate. Pt demonstrating increased muscle guarding this session versus the morning. Post sit, O2 saturation 887-88%; encouraged pursed lip,deep breathing with improvement to 95% over 1.5 minutes and maintains. Encouraged use of incentive spirometer. Pt demonstrates stand today without use Left lower extremity at all; Left lower extremity braced on Right lower extremity and flexed at hip and knee; pt pivots on Right lower extremity to chair a couple of feet with min A. Reviewed and performed isometric strengthening in chair. Pt positioned for comfort and support with pillows in chair. Spoke with nursing regarding use of a knee immobilizer on left to allow for increased use of Left lower extremity with stand activities. Continue PT to progress range, strength, endurance and use of Left lower extremity to improve functional mobility.   Follow Up Recommendations  SNF     Equipment Recommendations  None recommended by PT    Recommendations for Other Services       Precautions / Restrictions Precautions Precautions: Fall Restrictions Weight Bearing Restrictions: Yes LLE Weight Bearing: Partial weight bearing    Mobility  Bed Mobility Overal bed mobility: Needs Assistance Bed Mobility: Supine to Sit     Supine to  sit: Min assist;Mod assist     General bed mobility comments: Min to sit, mod to scoot to edge of bed  Transfers Overall transfer level: Needs assistance Equipment used: Left platform walker Transfers: Sit to/from Stand Sit to Stand: Min assist         General transfer comment: Stand only on RLE with LLE supported on RLE; pt unableto abduct leg to neutral and place on the ground. L knee is flexed.   Ambulation/Gait Ambulation/Gait assistance: Min assist Ambulation Distance (Feet): 2 Feet Assistive device: Left platform walker       General Gait Details: Pt does not actually ambulate, rather pivots on RLE around to chair with LLE maintaining flexed and adducted position on RLE. Good strength and control with RLE as well as controlled sit to the chair with Min A   Stairs            Wheelchair Mobility    Modified Rankin (Stroke Patients Only)       Balance                                    Cognition Arousal/Alertness: Awake/alert Behavior During Therapy: WFL for tasks assessed/performed Overall Cognitive Status: Within Functional Limits for tasks assessed                      Exercises General Exercises - Lower Extremity Ankle Circles/Pumps: AROM;Both;10 reps;Other (comment) Quad Sets: Strengthening;Both;10 reps Gluteal Sets: Strengthening;Both;10 reps Short  Arc Javier DockerSinclair Ship;Left;10 reps;Supine (AROM R) Long Arc Quad: AAROM;Left;10 reps;Seated Heel Slides: AAROM;Left;10 reps;Supine (AROM R) Hip ABduction/ADduction: AAROM;10 reps;Supine;Both Hip Flexion/Marching: AAROM;Left;10 reps;Seated Other Exercises Other Exercises: Add'r squeeze 10x supine    General Comments        Pertinent Vitals/Pain Pain Assessment: 0-10 Pain Score: 8  (0 at rest) Pain Location: LLE to the knee Pain Intervention(s): Monitored during session;Repositioned    Home Living                      Prior Function            PT Goals (current  goals can now be found in the care plan section) Progress towards PT goals: Progressing toward goals    Frequency    BID      PT Plan Current plan remains appropriate    Co-evaluation             End of Session Equipment Utilized During Treatment: Gait belt Activity Tolerance: Patient limited by pain Patient left: in chair;with call bell/phone within reach;with chair alarm set     Time: 1451-1525 PT Time Calculation (min) (ACUTE ONLY): 34 min  Charges:  $Therapeutic Exercise: 8-22 mins $Therapeutic Activity: 8-22 mins                    G CodesLarae Grooms, PTA 02/28/2016, 3:35 PM

## 2016-02-28 NOTE — Progress Notes (Signed)
Subjective: 2 Days Post-Op Procedure(s) (LRB): INTRAMEDULLARY (IM) NAIL INTERTROCHANTRIC (Left)    Patient reports pain as mild. Alert and oriented.  Feels weak.  hgb down to 7.7 so will transfuse 1 unit PRBC.    Objective:   VITALS:   Vitals:   02/28/16 0406 02/28/16 0753  BP: (!) 124/47 (!) 151/47  Pulse: 79 81  Resp: 16 16  Temp: 98.3 F (36.8 C) 98.3 F (36.8 C)    Neurologically intact ABD soft Neurovascular intact Sensation intact distally Intact pulses distally Dorsiflexion/Plantar flexion intact Incision: scant drainage  LABS  Recent Labs  02/26/16 0314 02/27/16 0415 02/28/16 0343  HGB 9.7* 8.2* 7.7*  HCT 29.6* 24.9* 23.5*  WBC 10.3 10.6 9.0  PLT 164 141* 156     Recent Labs  02/26/16 0314 02/27/16 0415 02/28/16 0343  NA 141 138 140  K 4.4 4.0 3.5  BUN 22* 21* 27*  CREATININE 1.03* 1.07* 1.01*  GLUCOSE 164* 137* 112*     Recent Labs  02/25/16 1243  INR 1.05     Assessment/Plan: 2 Days Post-Op Procedure(s) (LRB): INTRAMEDULLARY (IM) NAIL INTERTROCHANTRIC (Left)   Up with therapy Discharge to SNF after transfusion RTC 10 days

## 2016-02-28 NOTE — Progress Notes (Signed)
Occupational Therapy Treatment Patient Details Name: Ana Schultz MRN: [id number] DOB: [birth year] Today's Date: 02/28/2016    History of present illness Pt. is an 82 y.o. who was admitted to [facility] with a Left Hip Fracture.   OT comments  Pt. Continues to present with weakness, decreased ROM and functional hand use, pain, and limited mobility which hinder her ability to complete ADL skills. Pt. Tolerated ROM to her Left UE and hand. Pt. Education was provided about self ROM with emphasis on slow, gentle stretches, and positioning. Pt. Continues to plan to go to SNF and [facility] upon discharge.      Follow Up Recommendations  SNF    Equipment Recommendations       Recommendations for Other Services      Precautions / Restrictions Precautions Precautions: Fall Restrictions Weight Bearing Restrictions: Yes LLE Weight Bearing: Partial weight bearing                                             ADL   Eating/Feeding: Set up   Grooming: Set up               Lower Body Dressing: Maximal assistance                        Vision                     Perception     Praxis      Cognition   Behavior During Therapy: WFL for tasks assessed/performed Overall Cognitive Status: Within Functional Limits for tasks assessed                       Extremity/Trunk Assessment               Exercises     Shoulder Instructions       General Comments      Pertinent Vitals/ Pain          Home Living                                          Prior Functioning/Environment              Frequency  Min 1X/week        Progress Toward Goals  OT Goals(current goals can now be found in the care plan section)        Plan      Co-evaluation                 End of Session     Activity Tolerance Patient tolerated treatment well   Patient Left in bed;with call bell/phone within  reach;with bed alarm set   Nurse Communication          Time: [id number] OT Time Calculation (min): 23 min  Charges: OT General Charges $OT Visit: 1 Procedure OT Treatments $Self Care/Home Management : 23-37 mins  Ana Carina, MS, OTR/L 02/28/2016, 12:42 PM

## 2016-02-28 NOTE — Progress Notes (Signed)
Physical Therapy Treatment Patient Details Name: Ana Schultz MRN: [id number] DOB: December 15, 1925 Today's Date: 02/28/2016    History of Present Illness Pt. is an 82 y.o. who was admitted to [facility] with a Left Hip Fracture.    PT Comments    Pt agreeable to PT; has complains of pain in Left lower extremity and generalized malaise. Pt participates in lower extremity exercises requiring assist throughout with Left lower extremity. Weakened quad and glut set and limited range with movement exercises. Pt notes need to use the bed pan; therefore out of bed activity not attempted at this time. Attempted to return back to pt's room for up in bed/out of bed activity this morn; however, pt with MD and then again on bed pan. Continue PT in afternoon session for hopeful out of bed activity to progress strength and functional mobility.   Follow Up Recommendations  SNF     Equipment Recommendations  None recommended by PT    Recommendations for Other Services       Precautions / Restrictions Precautions Precautions: Fall Restrictions Weight Bearing Restrictions: Yes LLE Weight Bearing: Partial weight bearing    Mobility  Bed Mobility               General bed mobility comments: Not tested; treatment limited due to pt requesting use of bed pan  Transfers                    Ambulation/Gait                 Stairs            Wheelchair Mobility    Modified Rankin (Stroke Patients Only)       Balance                                    Cognition Arousal/Alertness: Awake/alert Behavior During Therapy: WFL for tasks assessed/performed Overall Cognitive Status: Within Functional Limits for tasks assessed                      Exercises General Exercises - Lower Extremity Ankle Circles/Pumps: AROM;Both;20 reps;Supine Quad Sets: Strengthening;Both;10 reps;Supine Gluteal Sets: Strengthening;Both;10 reps;Supine Short Arc Quad:  AAROM;Left;10 reps;Supine (AROM R) Heel Slides: AAROM;Left;10 reps;Supine (AROM R) Hip ABduction/ADduction: AAROM;10 reps;Supine;Both Other Exercises Other Exercises: Add'r squeeze 10x supine    General Comments        Pertinent Vitals/Pain Pain Assessment: 0-10 Pain Score: 5  Pain Location: L hip/thigh Pain Intervention(s): Limited activity within patient's tolerance;Monitored during session;Premedicated before session    Home Living                      Prior Function            PT Goals (current goals can now be found in the care plan section) Progress towards PT goals: Progressing toward goals (slowly)    Frequency    BID      PT Plan Current plan remains appropriate    Co-evaluation             End of Session   Activity Tolerance: Patient tolerated treatment well;Patient limited by pain Patient left: in bed;with call bell/phone within reach;with bed alarm set     Time: [id number] PT Time Calculation (min) (ACUTE ONLY): 18 min  Charges:  $Therapeutic Exercise: 8-22 mins  G CodesLarae Grooms, PTA 02/28/2016, 1:33 PM

## 2016-02-28 NOTE — Progress Notes (Signed)
Per MD patient is not medically stable for D/C today. Clinical Education officer, museum (CSW) sent [employer] at [employer] a message making her aware of above. Plan is for patient to D/C to [facility] when stable. CSW will continue to follow and assist as needed.   [employer], LCSW [phone]

## 2016-02-28 NOTE — Progress Notes (Addendum)
[place] [facility] [place] NAME: Ana Schultz    MR#:  [id number]  DATE OF BIRTH:  1925-05-11  SUBJECTIVE:  CHIEF COMPLAINT: pts pain is well controlled, no other complaints.Tolerating diet. No bowel movement yet  REVIEW OF SYSTEMS:  CONSTITUTIONAL: No fever, fatigue or weakness.  EYES: No blurred or double vision.  EARS, NOSE, AND THROAT: No tinnitus or ear pain.  RESPIRATORY: No cough, shortness of breath, wheezing or hemoptysis.  CARDIOVASCULAR: No chest pain, orthopnea, edema.  GASTROINTESTINAL: No nausea, vomiting, diarrhea or abdominal pain.  GENITOURINARY: No dysuria, hematuria.  ENDOCRINE: No polyuria, nocturia,  HEMATOLOGY: No anemia, easy bruising or bleeding SKIN: No rash or lesion. MUSCULOSKELETAL: lt hip joint pain Status post surgery NEUROLOGIC: No tingling, numbness, weakness.  PSYCHIATRY: No anxiety or depression.   DRUG ALLERGIES:   Allergies  Allergen Reactions  . Iodinated Diagnostic Agents Itching and Swelling  . Ciprofloxacin Other (See Comments)    Colitis  . Nitrofurantoin Diarrhea  . Solifenacin Other (See Comments)    indigestion  . Metronidazole Itching and Rash    VITALS:  Blood pressure (!) 136/47, pulse 72, temperature 97.8 F (36.6 C), temperature source Oral, resp. rate 18, height 5\' 1"  (1.549 m), weight 62.8 kg (138 lb 6.2 oz), SpO2 98 %.  PHYSICAL EXAMINATION:  GENERAL:  82 y.o.-year-old patient lying in the bed with no acute distress.  EYES: Pupils equal, round, reactive to light and accommodation. No scleral icterus. Extraocular muscles intact.  HEENT: Head atraumatic, normocephalic. Oropharynx and nasopharynx clear.  NECK:  Supple, no jugular venous distention. No thyroid enlargement, no tenderness.  LUNGS: Normal breath sounds bilaterally, no wheezing, rales,rhonchi or crepitation. No use of accessory muscles of respiration.  CARDIOVASCULAR: S1, S2 normal. No murmurs, rubs, or gallops.   ABDOMEN: Soft, nontender, nondistended. Bowel sounds present. No organomegaly or mass.  EXTREMITIES: Left hip joint status post surgery No pedal edema, cyanosis, or clubbing.  NEUROLOGIC: Cranial nerves II through XII are intact. Muscle strength 5/5 in all extremities except lt hip-trnder,int rotated. Sensation intact. Gait not checked.  PSYCHIATRIC: The patient is alert and oriented x 3.  SKIN: No obvious rash, lesion, or ulcer.    LABORATORY PANEL:   CBC  Recent Labs Lab 02/28/16 0343  WBC 9.0  HGB 7.7*  HCT 23.5*  PLT 156   ------------------------------------------------------------------------------------------------------------------  Chemistries   Recent Labs Lab 02/25/16 1243  02/28/16 0343  NA 140  < > 140  K 3.9  < > 3.5  CL 103  < > 105  CO2 29  < > 27  GLUCOSE 128*  < > 112*  BUN 24*  < > 27*  CREATININE 1.12*  < > 1.01*  CALCIUM 8.7*  < > 8.3*  AST 19  --   --   ALT 32  --   --   ALKPHOS 110  --   --   BILITOT 1.1  --   --   < > = values in this interval not displayed. ------------------------------------------------------------------------------------------------------------------  Cardiac Enzymes No results for input(s): TROPONINI in the last 168 hours. ------------------------------------------------------------------------------------------------------------------  RADIOLOGY:  Dg Hip Operative Unilat W Or W/o Pelvis Left  Result Date: 02/26/2016 CLINICAL DATA:  ORIF intertrochanteric left femoral neck fracture with intramedullary nail. EXAM: OPERATIVE LEFT HIP (WITH PELVIS IF PERFORMED) 2 VIEWS TECHNIQUE: Fluoroscopic spot image(s) were submitted for interpretation post-operatively. COMPARISON:  02/25/2016. FINDINGS: Multiple spot images obtained at various intervals throughout the ORIF of the left  intertrochanteric femoral neck fracture were obtained. The completion images demonstrate an intramedullary nail and compression screw traversing the  intertrochanteric fracture. Alignment appears anatomic on the spot images. IMPRESSION: ORIF of the nondisplaced intertrochanteric left femoral neck fracture. Electronically Signed   By: Evangeline Dakin M.D.   On: 02/26/2016 15:23    EKG:   Orders placed or performed during the hospital encounter of 02/25/16  . EKG 12-Lead  . EKG 12-Lead    ASSESSMENT AND PLAN:   This is an 82 year old female admitted for femur fracture.  # Acute on ch anemia -Multifactorial-postoperative/Eliquis/history of polyps on colonoscopy 5 years ago/Hemodilution from IV fluids  Hemoglobin-11.1-9.7-7.7 We will transfuse 1 unit of blood today and monitor hemoglobin closely Check stool for Hemoccult GI consult is placed given the history of polyps in the past Hold  Eliquis  for chronic atrial fibrillation- d/w dr.Gollan  #.Acute intertrochanteric fracture of left pod#2 Clinically doing fine Pain management by ortho dr.Miller Resume her aspirin as well as systemic anticoagulation prior to surgery. Cardiology Dr. Rockey Situ has cleared the patient from cardiac standpoint for surgery  #. Hypertension: better today Continue Cardizem  . Labetalol as needed  #. Chronic kidney disease: Stage III;  resume Lasix scheduled bid  Avoid nephrotoxic agents.  Hydrate with intravenous fluids post op.  #. COPD: Controlled; continue inhaled corticosteroid as well as Spiriva. Daily home doseprednisone. Albuterol as necessary.  #. History of ascending aortic aneurysm status post repair in March 2016 and history of paroxysmal atrial fibrillation Patient was seen and cleared by cardiology for surgery Cardiac he has committed to continue her home medications  #. DVT prophylaxis:  scd  #. GI prophylaxis: PPI per home regimen  PT  is recommending skilled nursing facility  All the records are reviewed and case discussed with Care Management/Social Workerr. Management plans discussed with the patient, Patient's grandson  healthcare power of attorney, Mr. Leroy Sea [phone]  and they are in agreement.  CODE STATUS: fc  TOTAL TIME TAKING CARE OF THIS PATIENT: 34 minutes.   POSSIBLE D/C IN am  DAYS, DEPENDING ON CLINICAL CONDITION.  Note: This dictation was prepared with Dragon dictation along with smaller phrase technology. Any transcriptional errors that result from this process are unintentional.   Nicholes Mango M.D on 02/28/2016 at 1:09 PM  Between 7am to 6pm - Pager - [phone] After 6pm go to [email or web address] - password [email or web address] [facility]  Office  [phone]  CC: Primary care physician; Juluis Pitch, MD

## 2016-02-29 DIAGNOSIS — D62 Acute posthemorrhagic anemia: Secondary | ICD-10-CM

## 2016-02-29 LAB — BASIC METABOLIC PANEL
Anion gap: 6 (ref 5–15)
BUN: 28 mg/dL — ABNORMAL HIGH (ref 6–20)
CALCIUM: 8.1 mg/dL — AB (ref 8.9–10.3)
CHLORIDE: 104 mmol/L (ref 101–111)
CO2: 30 mmol/L (ref 22–32)
Creatinine, Ser: 1.19 mg/dL — ABNORMAL HIGH (ref 0.44–1.00)
GFR, EST AFRICAN AMERICAN: 46 mL/min — AB (ref >60)
GFR, EST NON AFRICAN AMERICAN: 40 mL/min — AB (ref >60)
Glucose, Bld: 114 mg/dL — ABNORMAL HIGH (ref 65–99)
Potassium: 4.1 mmol/L (ref 3.5–5.1)
SODIUM: 140 mmol/L (ref 135–145)

## 2016-02-29 LAB — HEMOGLOBIN AND HEMATOCRIT, BLOOD
HCT: 26.4 % — ABNORMAL LOW (ref 35.0–47.0)
HEMATOCRIT: 29.1 % — AB (ref 35.0–47.0)
HEMOGLOBIN: 9.6 g/dL — AB (ref 12.0–16.0)
Hemoglobin: 8.7 g/dL — ABNORMAL LOW (ref 12.0–16.0)

## 2016-02-29 LAB — CBC
HCT: 26.5 % — ABNORMAL LOW (ref 35.0–47.0)
HEMOGLOBIN: 8.8 g/dL — AB (ref 12.0–16.0)
MCH: 29.1 pg (ref 26.0–34.0)
MCHC: 33.3 g/dL (ref 32.0–36.0)
MCV: 87.5 fL (ref 80.0–100.0)
PLATELETS: 153 10*3/uL (ref 150–440)
RBC: 3.03 MIL/uL — ABNORMAL LOW (ref 3.80–5.20)
RDW: 16.9 % — ABNORMAL HIGH (ref 11.5–14.5)
WBC: 8.9 10*3/uL (ref 3.6–11.0)

## 2016-02-29 LAB — VITAMIN B12: Vitamin B-12: 625 pg/mL (ref 180–914)

## 2016-02-29 LAB — IRON AND TIBC
Iron: 11 ug/dL — ABNORMAL LOW (ref 28–170)
Saturation Ratios: 5 % — ABNORMAL LOW (ref 10.4–31.8)
TIBC: 225 ug/dL — ABNORMAL LOW (ref 250–450)
UIBC: 214 ug/dL

## 2016-02-29 LAB — FERRITIN: Ferritin: 119 ng/mL (ref 11–307)

## 2016-02-29 LAB — FOLATE: FOLATE: 9.2 ng/mL (ref >5.9)

## 2016-02-29 NOTE — Consult Note (Signed)
Lucilla Lame, MD [address] [place], [place] [address] Phone: [phone] Fax : [phone]  Consultation  Referring Provider:     No ref. provider found Primary Care Physician:  Juluis Pitch, MD Primary Gastroenterologist:  Dr. Allen Norris         Reason for Consultation:     Anemia  Date of Admission:  02/25/2016 Date of Consultation:  02/29/2016         HPI:   Ana Schultz is a 82 y.o. female who was admitted with a left hip fracture after a fall. The patient was admitted with a hemoglobin that was around her normal of 12.2. The patient then had her surgery and dropped to 9.7. Subsequent to that the patient's hemoglobin went down to 8.2 and 7.7. After 1 unit of blood the hemoglobin of 7.7 one up to 9.8 which was yesterday and now it is down to 8.8. The patient has had no sign of any GI bleeding over the course of this drop in hemoglobin and also was found to have heme-negative stools. The patient had been put on anticoagulation for her history of strokes and for a-fib. The patient denies any abdominal pain nausea vomiting fevers or chills. The patient does report that she has been constipated. There is no report of any dark or black stools. I'm now being asked to see the patient for her anemia. I have seen the patient in the past for PEG tube removal back last year. Removal of the PEG tube was uneventful and the patient has been eating well since then.  Past Medical History:  Diagnosis Date  . Adenoma of rectum   . Adrenal adenoma   . Anemia   . Atrophic vaginitis   . COPD (chronic obstructive pulmonary disease) ([place])   . Diverticulosis   . Dysphagia   . Frequent UTI   . Hemiplegia and hemiparesis ([place])    following cerebral infarction affecting left non-dominant side  . Microscopic hematuria   . Nonrheumatic aortic valve insufficiency   . Osteoporosis   . Paroxysmal atrial fibrillation ([facility])   . Renal cyst   . Stroke ([place])   . Urge incontinence      Past Surgical History:  Procedure Laterality Date  . ABDOMINAL HYSTERECTOMY    . AORTOILIAC BYPASS    . INTRAMEDULLARY (IM) NAIL INTERTROCHANTERIC Left 02/26/2016   Procedure: INTRAMEDULLARY (IM) NAIL INTERTROCHANTRIC;  Surgeon: Earnestine Leys, MD;  Location: [facility] ORS;  Service: Orthopedics;  Laterality: Left;    Prior to Admission medications   Medication Sig Start Date End Date Taking? Authorizing Provider  acetaminophen (TYLENOL) 325 MG tablet Take 650 mg by mouth 4 (four) times daily.    Yes Historical Provider, MD  acetaminophen (TYLENOL) 325 MG tablet Take 650 mg by mouth every 4 (four) hours as needed for mild pain or fever.   Yes Historical Provider, MD  albuterol (PROVENTIL HFA;VENTOLIN HFA) 108 (90 Base) MCG/ACT inhaler Inhale 2 puffs into the lungs every 4 (four) hours as needed for wheezing or shortness of breath. 11/09/15  Yes Flora Lipps, MD  apixaban (ELIQUIS) 2.5 MG TABS tablet Take 2.5 mg by mouth 2 (two) times daily.   Yes Historical Provider, MD  Artificial Tear Ointment (ARTIFICIAL TEARS) ointment Place 1 application into the left eye 4 (four) times daily.   Yes Historical Provider, MD  aspirin EC 81 MG tablet Take 81 mg by mouth daily.    Yes Historical Provider, MD  atorvastatin (LIPITOR) 20 MG  tablet Take 20 mg by mouth at bedtime.    Yes Historical Provider, MD  benzonatate (TESSALON) 100 MG capsule Take 100 mg by mouth every 8 (eight) hours as needed for cough.   Yes Historical Provider, MD  bisacodyl (DULCOLAX) 10 MG suppository Place 10 mg rectally daily as needed for moderate constipation.    Yes Historical Provider, MD  clonazePAM (KLONOPIN) 0.5 MG tablet Take 0.25 mg by mouth daily as needed for anxiety.    Yes Historical Provider, MD  clonazePAM (KLONOPIN) 0.5 MG tablet Take 0.25 mg by mouth daily.   Yes Historical Provider, MD  cyanocobalamin 1000 MCG tablet Take 1,000 mcg by mouth daily.   Yes Historical Provider, MD  diclofenac sodium (VOLTAREN) 1 % GEL  Apply 4 g topically 4 (four) times daily as needed (pain).   Yes Historical Provider, MD  diltiazem (CARDIZEM) 60 MG tablet Take 60 mg by mouth daily.   Yes Historical Provider, MD  fluticasone-salmeterol (ADVAIR HFA) 230-21 MCG/ACT inhaler Inhale 2 puffs into the lungs 2 (two) times daily.   Yes Historical Provider, MD  furosemide (LASIX) 40 MG tablet Take 60 mg by mouth 2 (two) times daily.    Yes Historical Provider, MD  ipratropium (ATROVENT) 0.03 % nasal spray Place 2 sprays into both nostrils every 8 (eight) hours as needed for rhinitis.   Yes Historical Provider, MD  magnesium hydroxide (MILK OF MAGNESIA) 400 MG/5ML suspension Take 30 mLs by mouth daily as needed for mild constipation.   Yes Historical Provider, MD  Melatonin 3 MG TABS Take 6 mg by mouth at bedtime.  08/10/14  Yes Historical Provider, MD  methocarbamol (ROBAXIN) 500 MG tablet Take 500 mg by mouth every 6 (six) hours as needed for muscle spasms.   Yes Historical Provider, MD  omeprazole (PRILOSEC) 40 MG capsule Take 40 mg by mouth daily.    Yes Historical Provider, MD  oxyCODONE (ROXICODONE) 5 MG immediate release tablet Take 1 tablet (5 mg total) by mouth every 8 (eight) hours as needed. Patient taking differently: Take 5 mg by mouth every 8 (eight) hours as needed for severe pain.  01/26/16 01/25/17 Yes Rudene Re, MD  polyethylene glycol [facility]) packet Take 17 g by mouth 2 (two) times daily. 02/06/16  Yes Loney Hering, MD  polyvinyl alcohol-povidone (HYPOTEARS) 1.4-0.6 % ophthalmic solution Place 1 drop into both eyes as needed (dry eyes).   Yes Historical Provider, MD  potassium chloride SA (K-DUR,KLOR-CON) 20 MEQ tablet Take 20 mEq by mouth 2 (two) times daily.    Yes Historical Provider, MD  predniSONE (DELTASONE) 20 MG tablet Take 1 tablet (20 mg total) by mouth daily with breakfast. 12/24/15  Yes Flora Lipps, MD  senna (SENOKOT) 8.6 MG TABS tablet Take 1 tablet (8.6 mg total) by mouth daily. 01/26/16  Yes Rudene Re, MD  sertraline (ZOLOFT) 50 MG tablet Take 50 mg by mouth daily.   Yes Historical Provider, MD  tiotropium (SPIRIVA) 18 MCG inhalation capsule Place 18 mcg into inhaler and inhale daily.   Yes Historical Provider, MD  traZODone (DESYREL) 50 MG tablet Take 50 mg by mouth at bedtime as needed for sleep.   Yes Historical Provider, MD  trimethoprim (TRIMPEX) 100 MG tablet Take 100 mg by mouth daily.   Yes Historical Provider, MD  fluticasone (FLOVENT HFA) 110 MCG/ACT inhaler Inhale into the lungs. 08/10/14 08/10/15  Historical Provider, MD    Family History  Problem Relation Age of Onset  . Lung cancer Son   .  CAD Father   . CAD Brother      Social History  Substance Use Topics  . Smoking status: Former Research scientist (life sciences)  . Smokeless tobacco: Never Used  . Alcohol use No    Allergies as of 02/25/2016 - Review Complete 02/25/2016  Allergen Reaction Noted  . Iodinated diagnostic agents Itching and Swelling 10/09/2014  . Ciprofloxacin Other (See Comments) 10/09/2014  . Nitrofurantoin Diarrhea 10/09/2014  . Solifenacin Other (See Comments) 10/09/2014  . Metronidazole Itching and Rash 10/09/2014    Review of Systems:    All systems reviewed and negative except where noted in HPI.   Physical Exam:  Vital signs in last 24 hours: Temp:  [97.9 F (36.6 C)-98.8 F (37.1 C)] 97.9 F (36.6 C) (10/27 0708) Pulse Rate:  [75-91] 87 (10/27 0708) Resp:  [16-18] 18 (10/27 0708) BP: (102-145)/(37-53) 102/49 (10/27 0708) SpO2:  [92 %-98 %] 93 % (10/27 0708) Weight:  [135 lb 12.5 oz (61.6 kg)] 135 lb 12.5 oz (61.6 kg) (10/27 0427) Last BM Date: 02/28/16 General:   Pleasant, cooperative in NAD Head:  Normocephalic and atraumatic. Eyes:   No icterus.   Conjunctiva pink. PERRLA. Ears:  Normal auditory acuity. Neck:  Supple; no masses or thyroidomegaly Lungs: Respirations even and unlabored. Lungs clear to auscultation bilaterally.   No wheezes, crackles, or rhonchi.  Heart:  Regular rate and  rhythm;  Without murmur, clicks, rubs or gallops Abdomen:  Soft, nondistended, nontender. Normal bowel sounds. No appreciable masses or hepatomegaly.  No rebound or guarding.  Rectal:  Not performed. Msk:  Symmetrical without gross deformities.    Extremities:  Without edema, cyanosis or clubbing. Neurologic:  Alert and oriented x3. Skin:  Intact without significant lesions or rashes. Cervical Nodes:  No significant cervical adenopathy. Psych:  Alert and cooperative. Normal affect.  LAB RESULTS:  Recent Labs  02/27/16 0415 02/28/16 0343 02/28/16 2154 02/29/16 0427  WBC 10.6 9.0  --  8.9  HGB 8.2* 7.7* 9.8* 8.8*  HCT 24.9* 23.5* 29.7* 26.5*  PLT 141* 156  --  153   BMET  Recent Labs  02/27/16 0415 02/28/16 0343 02/29/16 0427  NA 138 140 140  K 4.0 3.5 4.1  CL 107 105 104  CO2 25 27 30   GLUCOSE 137* 112* 114*  BUN 21* 27* 28*  CREATININE 1.07* 1.01* 1.19*  CALCIUM 8.3* 8.3* 8.1*   LFT No results for input(s): PROT, ALBUMIN, AST, ALT, ALKPHOS, BILITOT, BILIDIR, IBILI in the last 72 hours. PT/INR No results for input(s): LABPROT, INR in the last 72 hours.  STUDIES: No results found.    Impression / Plan:   GEOVANNA ROCKMORE is a 82 y.o. y/o female with a drop in hemoglobin from 12.2-7.7 prior to transfusion with a hemoglobin of 8.8 today. It is likely that the majority of the patient's drop in hemoglobin is from her recent surgery. The patient has been on anticoagulation and a GI source was of concern. The patient has had heme-negative stools 1. The patient's hemoglobin went up 2. after 1 unit which begs the question whether the 7.7 was correct or the 9.8 was correct. If the 7.7 was correct than the 1 unit would have brought her to where she is today at 8.8. If it is not correct then the patient has lost another unit of blood overnight. With this in mind the patient should have had heme positive stools at minimum and melena more likely with such a large volume of blood  loss is a  was from the GI tract. The patient has no abdominal pain nausea or vomiting. I would recommend continued checking of her hemoglobin and stools for Hemoccult. If the patient's stools remain heme negative and the blood count continues to drop I would suggest that a another source of her anemia be sought. I would also be somewhat suspect of a GI cause for the blood loss if the stools remain brown and her only weakly heme positive. I will follow the patient along with you and have discussed these issues with the patient and her granddaughter.  Thank you for involving me in the care of this patient.      LOS: 4 days   Lucilla Lame, MD  02/29/2016, 1:48 PM   Note: This dictation was prepared with Dragon dictation along with smaller phrase technology. Any transcriptional errors that result from this process are unintentional.

## 2016-02-29 NOTE — Progress Notes (Signed)
[facility] [place] NAME: Litia Tadesse    MR#:  [id number]  DATE OF BIRTH:  01-Aug-1925  SUBJECTIVE:  CHIEF COMPLAINT: pts pain is well controlled, no other complaints.Tolerating diet. No bowel movement yet  REVIEW OF SYSTEMS:  CONSTITUTIONAL: No fever, fatigue or weakness.  EYES: No blurred or double vision.  EARS, NOSE, AND THROAT: No tinnitus or ear pain.  RESPIRATORY: No cough, shortness of breath, wheezing or hemoptysis.  CARDIOVASCULAR: No chest pain, orthopnea, edema.  GASTROINTESTINAL: No nausea, vomiting, diarrhea or abdominal pain.  GENITOURINARY: No dysuria, hematuria.  ENDOCRINE: No polyuria, nocturia,  HEMATOLOGY: No anemia, easy bruising or bleeding SKIN: No rash or lesion. MUSCULOSKELETAL: lt hip joint pain Status post surgery NEUROLOGIC: No tingling, numbness, weakness.  PSYCHIATRY: No anxiety or depression.   DRUG ALLERGIES:   Allergies  Allergen Reactions  . Iodinated Diagnostic Agents Itching and Swelling  . Ciprofloxacin Other (See Comments)    Colitis  . Nitrofurantoin Diarrhea  . Solifenacin Other (See Comments)    indigestion  . Metronidazole Itching and Rash    VITALS:  Blood pressure (!) 102/49, pulse 87, temperature 97.9 F (36.6 C), temperature source Oral, resp. rate 18, height 5\' 1"  (1.549 m), weight 61.6 kg (135 lb 12.5 oz), SpO2 93 %.  PHYSICAL EXAMINATION:  GENERAL:  82 y.o.-year-old patient lying in the bed with no acute distress.  EYES: Pupils equal, round, reactive to light and accommodation. No scleral icterus. Extraocular muscles intact.  HEENT: Head atraumatic, normocephalic. Oropharynx and nasopharynx clear.  NECK:  Supple, no jugular venous distention. No thyroid enlargement, no tenderness.  LUNGS: Normal breath sounds bilaterally, no wheezing, rales,rhonchi or crepitation. No use of accessory muscles of respiration.  CARDIOVASCULAR: S1, S2 normal. No murmurs, rubs, or  gallops.  ABDOMEN: Soft, nontender, nondistended. Bowel sounds present. No organomegaly or mass.  EXTREMITIES: Left hip joint status post surgery No pedal edema, cyanosis, or clubbing.  NEUROLOGIC: Cranial nerves II through XII are intact. Muscle strength 5/5 in all extremities except lt hip-trnder,int rotated. Sensation intact. Gait not checked.  PSYCHIATRIC: The patient is alert and oriented x 3.  SKIN: No obvious rash, lesion, or ulcer.    LABORATORY PANEL:   CBC  Recent Labs Lab 02/29/16 0427  WBC 8.9  HGB 8.8*  HCT 26.5*  PLT 153   ------------------------------------------------------------------------------------------------------------------  Chemistries   Recent Labs Lab 02/25/16 1243  02/29/16 0427  NA 140  < > 140  K 3.9  < > 4.1  CL 103  < > 104  CO2 29  < > 30  GLUCOSE 128*  < > 114*  BUN 24*  < > 28*  CREATININE 1.12*  < > 1.19*  CALCIUM 8.7*  < > 8.1*  AST 19  --   --   ALT 32  --   --   ALKPHOS 110  --   --   BILITOT 1.1  --   --   < > = values in this interval not displayed. ------------------------------------------------------------------------------------------------------------------  Cardiac Enzymes No results for input(s): TROPONINI in the last 168 hours. ------------------------------------------------------------------------------------------------------------------  RADIOLOGY:  No results found.  EKG:   Orders placed or performed during the hospital encounter of 02/25/16  . EKG 12-Lead  . EKG 12-Lead    ASSESSMENT AND PLAN:   This is an 82 year old female admitted for femur fracture.  # Acute on ch anemia -Multifactorial-postoperative/Eliquis/history of polyps on colonoscopy 5 years ago/Hemodilution from IV fluids Hemoglobin-11.1-9.7-7.7-Given blood  transfusion-9.9-8.8, concerning for acute blood loss We will transfuse as needed monitor hemoglobin closely Check stool for Hemoccult 3 GI consult is placed and discussed with Dr.  Allen Norris appreciate their recommendations  Hold  Eliquis  for chronic atrial fibrillation- d/w dr.Gollan, hold until cleared by him, follow up with him one week after discharge  #.Acute intertrochanteric fracture of left pod#3 Clinically doing fine Pain management by ortho dr.Miller Resume her aspirin as well as systemic anticoagulation prior to surgery. Cardiology Dr. Rockey Situ has cleared the patient from cardiac standpoint for surgery  #. Hypertension: better today Continue Cardizem  . Labetalol as needed  #. Chronic kidney disease: Stage III;  resume Lasix scheduled bid  Avoid nephrotoxic agents.  Hydrate with intravenous fluids post op.  #. COPD: Controlled; continue inhaled corticosteroid as well as Spiriva. Daily home doseprednisone. Albuterol as necessary.  #. History of ascending aortic aneurysm status post repair in March 2016 and history of paroxysmal atrial fibrillation Cardiac he has recommended  to continue her home medications except eliquis  #. DVT prophylaxis:  scd  #. GI prophylaxis: PPI per home regimen  PT  is recommending skilled nursing facility  All the records are reviewed and case discussed with Care Management/Social Workerr. Management plans discussed with the patient, Patient's grandson healthcare power of attorney, Mr. Leroy Sea [phone]  and [place]a [phone] they are in agreement.  CODE STATUS: fc  TOTAL TIME TAKING CARE OF THIS PATIENT: 34 minutes.   POSSIBLE D/C IN am  DAYS, DEPENDING ON CLINICAL CONDITION.  Note: This dictation was prepared with Dragon dictation along with smaller phrase technology. Any transcriptional errors that result from this process are unintentional.   Nicholes Mango M.D on 02/29/2016 at 3:47 PM  Between 7am to 6pm - Pager - [phone] After 6pm go to [email or web address] - password [email or web address]  Office  [phone]  CC: Primary care physician; Juluis Pitch, MD

## 2016-02-29 NOTE — Progress Notes (Signed)
  Subjective:  POD # 3 s/p intramedullary fixation for left intertrochanteric hip fracture. Patient reports pain as mild.  Patient had anemia yesterday and was transfused. GI has been consulted. L is on hold. Patient has age of fibrillation at baseline and has left hemiparesis resulting from aortic aneurysm.  Objective:   VITALS:   Vitals:   02/29/16 0409 02/29/16 0426 02/29/16 0427 02/29/16 0708  BP: (!) 110/38   (!) 102/49  Pulse: 82   87  Resp: 16   18  Temp: 98.2 F (36.8 C)   97.9 F (36.6 C)  TempSrc: Oral   Oral  SpO2: 93%   93%  Weight:  61.6 kg (135 lb 12.5 oz) 61.6 kg (135 lb 12.5 oz)   Height:        PHYSICAL EXAM:  Left lower; patient has moderate sanguinous drainage on the left hip dressing. There is ecchymosis posterior to the incision. Thigh is swollen but compartments are soft and compressible. She has intact sensation to light touch distally and has baseline motor function of her lower extremity. Her pedal pulses are intact. She has no calf tenderness.   LABS  Results for orders placed or performed during the hospital encounter of 02/25/16 (from the past 24 hour(s))  Hemoglobin and hematocrit, blood     Status: Abnormal   Collection Time: 02/28/16  9:54 PM  Result Value Ref Range   Hemoglobin 9.8 (L) 12.0 - 16.0 g/dL   HCT 29.7 (L) 35.0 - 47.0 %  CBC     Status: Abnormal   Collection Time: 02/29/16  4:27 AM  Result Value Ref Range   WBC 8.9 3.6 - 11.0 K/uL   RBC 3.03 (L) 3.80 - 5.20 MIL/uL   Hemoglobin 8.8 (L) 12.0 - 16.0 g/dL   HCT 26.5 (L) 35.0 - 47.0 %   MCV 87.5 80.0 - 100.0 fL   MCH 29.1 26.0 - 34.0 pg   MCHC 33.3 32.0 - 36.0 g/dL   RDW 16.9 (H) 11.5 - 14.5 %   Platelets 153 150 - 440 K/uL  Basic metabolic panel     Status: Abnormal   Collection Time: 02/29/16  4:27 AM  Result Value Ref Range   Sodium 140 135 - 145 mmol/L   Potassium 4.1 3.5 - 5.1 mmol/L   Chloride 104 101 - 111 mmol/L   CO2 30 22 - 32 mmol/L   Glucose, Bld 114 (H) 65 - 99  mg/dL   BUN 28 (H) 6 - 20 mg/dL   Creatinine, Ser 1.19 (H) 0.44 - 1.00 mg/dL   Calcium 8.1 (L) 8.9 - 10.3 mg/dL   GFR calc non Af Amer 40 (L) >60 mL/min   GFR calc Af Amer 46 (L) >60 mL/min   Anion gap 6 5 - 15    No results found.  Assessment/Plan: 3 Days Post-Op   Active Problems:   Intertrochanteric fracture of femur (HCC)   Closed fracture of left hip [facility])   Surgery, elective   Preop cardiovascular exam   AF (paroxysmal atrial fibrillation) ([place])  Patient is stable from an orthopedic standpoint.  Continue PT.  Eliquis treatment to be restarted when recommended by cardiology.  May discharge to SNF when cleared medically.  Follow up with Dr. Earnestine Leys in 7-10 days after discharge.    Thornton Park , MD 02/29/2016, 12:56 PM

## 2016-02-29 NOTE — Care Management Important Message (Signed)
Important Message  Patient Details  Name: TYNNETTA PILECKI MRN: [id number] Date of Birth: 08/29/1925   Medicare Important Message Given:  Yes    Jolly Mango, RN 02/29/2016, 9:03 AM

## 2016-02-29 NOTE — Progress Notes (Signed)
Physical Therapy Treatment Patient Details Name: Ana Schultz MRN: [id number] DOB: 02-Jun-1925 Today's Date: 02/29/2016    History of Present Illness Pt. is an 82 y.o. female who was admitted to [facility] with a Left Hip Fracture s/p a fall [facility] [place]. Pt. underwent a LLE intertronchanteris IM nailing.    PT Comments    Visibly fatigued this PM; expressing need for toileting upon therapist arrival. Able to complete sit/stand, basic transfers (SPT with L PFRW) with mod assist; continues with significant difficulty regarding L LE placement, WBing due to flexor/adductor tone with exertion.  Would benefit from therex with tone reduction strategies prior to mobility efforts in subsequent sessions to optimize ability to bear weight through L LE with movement transitions and gait attempts. Patient reporting soreness in bilat heels; towel rolls placed for pressure relief.   Follow Up Recommendations  SNF     Equipment Recommendations       Recommendations for Other Services       Precautions / Restrictions Precautions Precautions: Fall Restrictions Weight Bearing Restrictions: Yes LLE Weight Bearing: Partial weight bearing    Mobility  Bed Mobility Overal bed mobility: Needs Assistance Bed Mobility: Sit to Supine;Supine to Sit     Supine to sit: Min assist Sit to supine: Mod assist   General bed mobility comments: min assist for L LE management; dep for scooting up in bed  Transfers Overall transfer level: Needs assistance Equipment used: Left platform walker Transfers: Sit to/from Stand;Stand Pivot Transfers Sit to Stand: Mod assist         General transfer comment: increased L LE flexor tone with exertion, difficulty maintaining L foot flat/floor contact during movement transition as result  Ambulation/Gait                 Stairs            Wheelchair Mobility    Modified Rankin (Stroke Patients Only)       Balance                                     Cognition Arousal/Alertness: Awake/alert Behavior During Therapy: WFL for tasks assessed/performed Overall Cognitive Status: Within Functional Limits for tasks assessed                      Exercises Other Exercises Other Exercises: Sit/stand from edge of bed and from [facility] with L PFRW, min/mod assist--constant assist for L LE placement and WBing; dep assist for L LE positioning (unable to place for stepping, as flexor/adductor tone pulls L LE back towards midline with exertion)    General Comments        Pertinent Vitals/Pain Pain Assessment: 0-10 Faces Pain Scale: Hurts a little bit Pain Location: L hip Pain Descriptors / Indicators: Aching Pain Intervention(s): Limited activity within patient's tolerance;Repositioned;Monitored during session    Home Living                      Prior Function            PT Goals (current goals can now be found in the care plan section) Acute Rehab PT Goals Patient Stated Goal: To walk again PT Goal Formulation: With patient Time For Goal Achievement: 03/11/16 Potential to Achieve Goals: Fair Progress towards PT goals: Progressing toward goals    Frequency    BID      PT  Plan Current plan remains appropriate    Co-evaluation             End of Session   Activity Tolerance: Patient tolerated treatment well Patient left: in bed;with call bell/phone within reach;with bed alarm set     Time: [id number] PT Time Calculation (min) (ACUTE ONLY): 20 min  Charges:  $Therapeutic Activity: 8-22 mins                    G Codes:      Illene Sweeting H. Owens Shark, PT, DPT, NCS 02/29/16, 3:58 PM [phone]

## 2016-02-29 NOTE — Progress Notes (Signed)
Occupational Therapy Treatment Patient Details Name: ANNESSA REICHOW MRN: [id number] DOB: 10/22/1925 Today's Date: 02/29/2016    History of present illness Pt. is an 82 y.o. female who was admitted to [facility] with a Left Hip Fracture s/p a fall [facility] [place]. Pt. underwent a LLE intertronchanteric IM nailing.   OT comments  Pt. Tolerated PROM to all joint ranges of the LUE and hand. Pt. education was provided about self-ROM, and positioning of the LUE and hand.   Follow Up Recommendations  SNF    Equipment Recommendations       Recommendations for Other Services      Precautions / Restrictions Precautions Precautions: Fall Restrictions Weight Bearing Restrictions: Yes LLE Weight Bearing: Partial weight bearing                                                                                   Perception     Praxis      Cognition   Behavior During Therapy: WFL for tasks assessed/performed Overall Cognitive Status: Within Functional Limits for tasks assessed                                                  Pertinent Vitals/ Pain       Pain Assessment: Faces Faces Pain Scale: Hurts a little bit Pain Location: L hip Pain Descriptors / Indicators: Aching Pain Intervention(s): Limited activity within patient's tolerance;Monitored during session;Repositioned  Home Living                                          Prior Functioning/Environment              Frequency  Min 1X/week        Progress Toward Goals  OT Goals(current goals can now be found in the care plan section)  Progress towards OT goals: Progressing toward goals  Acute Rehab OT Goals Patient Stated Goal: To walk again  Plan      Co-evaluation                 End of Session     Activity Tolerance Patient tolerated treatment well   Patient Left in chair;with call bell/phone within reach;with chair alarm set    Nurse Communication          Time: [id number] OT Time Calculation (min): 23 min  Charges: OT General Charges $OT Visit: 1 Procedure OT Treatments $Self Care/Home Management : 23-37 mins  Harrel Carina, MS, OTR/L 02/29/2016, 11:13 AM

## 2016-02-29 NOTE — Progress Notes (Signed)
Physical Therapy Treatment Patient Details Name: Ana Schultz MRN: [id number] DOB: 1925-10-04 Today's Date: 02/29/2016    History of Present Illness Pt. is an 82 y.o. who was admitted to [facility] with a Left Hip Fracture s/p fall at ALF.  Pt is s/p LLE intertrochanteric IM nail.      PT Comments    Significant L LE flexor tone with exertion, difficulty maintaining L foot flat with movement transitions.  Discussed possible benefit of baclofen or tone-reducing medication with attending physician (will review during rounds). Patient very eager/motivated to participate/progress as able.   Follow Up Recommendations  SNF     Equipment Recommendations       Recommendations for Other Services       Precautions / Restrictions Precautions Precautions: Fall Restrictions Weight Bearing Restrictions: Yes LLE Weight Bearing: Partial weight bearing    Mobility  Bed Mobility Overal bed mobility: Needs Assistance Bed Mobility: Supine to Sit     Supine to sit: Min assist     General bed mobility comments: min assist for L LE management  Transfers Overall transfer level: Needs assistance Equipment used: Rolling walker (2 wheeled) Transfers: Sit to/from Stand Sit to Stand: Min assist         General transfer comment: increased L LE flexor tone with exertion, difficulty maintaining L foot flat/floor contact during movement transition as result  Ambulation/Gait Ambulation/Gait assistance: Min assist Ambulation Distance (Feet): 4 Feet Assistive device: Left platform walker       General Gait Details: difficulty with L foot placement/floor contact due to increased tone with exertion; constant manual cuing at L hip/knee for stability with WBing.  tends to scoot/slide R LE vs. stepping.  Dep for L UE placement on platform.   Stairs            Wheelchair Mobility    Modified Rankin (Stroke Patients Only)       Balance Overall balance assessment: Needs  assistance Sitting-balance support: No upper extremity supported;Feet supported Sitting balance-Leahy Scale: Good     Standing balance support: Bilateral upper extremity supported Standing balance-Leahy Scale: Fair                      Cognition Arousal/Alertness: Awake/alert Behavior During Therapy: WFL for tasks assessed/performed Overall Cognitive Status: Within Functional Limits for tasks assessed                      Exercises Other Exercises Other Exercises: Supine L LE therex, 1x10, act assist ROM: ankle pumps (with passive stretching, able to reach neutral), quad sets, SAQs, heel slides, hip abduct/adduct.  Difficulty isolating musculature knee distally. Other Exercises: Sit/stand with L PFRW, min assist +1--constant manual cuing L hip/knee extension in loading; progressed to include lateral weight shifting and R LE forward/retro and lateral stepping to promote increased WBing L LE.    General Comments        Pertinent Vitals/Pain Pain Assessment: Faces Faces Pain Scale: Hurts a little bit Pain Location: L hip Pain Descriptors / Indicators: Aching Pain Intervention(s): Limited activity within patient's tolerance;Monitored during session;Repositioned    Home Living                      Prior Function            PT Goals (current goals can now be found in the care plan section) Acute Rehab PT Goals Patient Stated Goal: To walk again PT Goal Formulation: With  patient Time For Goal Achievement: 03/11/16 Potential to Achieve Goals: Fair Progress towards PT goals: Progressing toward goals    Frequency    BID      PT Plan Current plan remains appropriate    Co-evaluation             End of Session Equipment Utilized During Treatment: Gait belt Activity Tolerance: Patient tolerated treatment well Patient left: in chair;with call bell/phone within reach;with chair alarm set     Time: [id number] PT Time Calculation (min)  (ACUTE ONLY): 30 min  Charges:  $Therapeutic Exercise: 8-22 mins $Therapeutic Activity: 8-22 mins                    G Codes:      Gustavus Haskin H. Owens Shark, PT, DPT, NCS 02/29/16, 11:08 AM [phone]

## 2016-02-29 NOTE — Progress Notes (Signed)
Per MD patient is not stable for D/C today and may be ready over the weekend. Plan is for patient to D/C to [facility] to room 220-A. RN will call report at [phone]. [facility] admissions coordinator at [facility] is aware of above. Clinical Social Worker (CSW) will continue to follow and assist as needed.   [employer], LCSW [phone]

## 2016-03-01 DIAGNOSIS — H04123 Dry eye syndrome of bilateral lacrimal glands: Secondary | ICD-10-CM | POA: Diagnosis not present

## 2016-03-01 DIAGNOSIS — F329 Major depressive disorder, single episode, unspecified: Secondary | ICD-10-CM | POA: Diagnosis not present

## 2016-03-01 DIAGNOSIS — Z7401 Bed confinement status: Secondary | ICD-10-CM | POA: Diagnosis not present

## 2016-03-01 DIAGNOSIS — J101 Influenza due to other identified influenza virus with other respiratory manifestations: Secondary | ICD-10-CM | POA: Diagnosis not present

## 2016-03-01 DIAGNOSIS — R109 Unspecified abdominal pain: Secondary | ICD-10-CM | POA: Diagnosis not present

## 2016-03-01 DIAGNOSIS — M6281 Muscle weakness (generalized): Secondary | ICD-10-CM | POA: Diagnosis not present

## 2016-03-01 DIAGNOSIS — N183 Chronic kidney disease, stage 3 (moderate): Secondary | ICD-10-CM | POA: Diagnosis not present

## 2016-03-01 DIAGNOSIS — S72142D Displaced intertrochanteric fracture of left femur, subsequent encounter for closed fracture with routine healing: Secondary | ICD-10-CM | POA: Diagnosis not present

## 2016-03-01 DIAGNOSIS — R300 Dysuria: Secondary | ICD-10-CM | POA: Diagnosis not present

## 2016-03-01 DIAGNOSIS — J309 Allergic rhinitis, unspecified: Secondary | ICD-10-CM | POA: Diagnosis not present

## 2016-03-01 DIAGNOSIS — Z87440 Personal history of urinary (tract) infections: Secondary | ICD-10-CM | POA: Diagnosis not present

## 2016-03-01 DIAGNOSIS — R062 Wheezing: Secondary | ICD-10-CM | POA: Diagnosis not present

## 2016-03-01 DIAGNOSIS — M8008XA Age-related osteoporosis with current pathological fracture, vertebra(e), initial encounter for fracture: Secondary | ICD-10-CM | POA: Diagnosis not present

## 2016-03-01 DIAGNOSIS — I129 Hypertensive chronic kidney disease with stage 1 through stage 4 chronic kidney disease, or unspecified chronic kidney disease: Secondary | ICD-10-CM | POA: Diagnosis not present

## 2016-03-01 DIAGNOSIS — I69354 Hemiplegia and hemiparesis following cerebral infarction affecting left non-dominant side: Secondary | ICD-10-CM | POA: Diagnosis not present

## 2016-03-01 DIAGNOSIS — M4856XA Collapsed vertebra, not elsewhere classified, lumbar region, initial encounter for fracture: Secondary | ICD-10-CM | POA: Diagnosis not present

## 2016-03-01 DIAGNOSIS — R011 Cardiac murmur, unspecified: Secondary | ICD-10-CM | POA: Diagnosis not present

## 2016-03-01 DIAGNOSIS — S32010A Wedge compression fracture of first lumbar vertebra, initial encounter for closed fracture: Secondary | ICD-10-CM | POA: Diagnosis not present

## 2016-03-01 DIAGNOSIS — I480 Paroxysmal atrial fibrillation: Secondary | ICD-10-CM | POA: Diagnosis not present

## 2016-03-01 DIAGNOSIS — R0982 Postnasal drip: Secondary | ICD-10-CM | POA: Diagnosis not present

## 2016-03-01 DIAGNOSIS — I2510 Atherosclerotic heart disease of native coronary artery without angina pectoris: Secondary | ICD-10-CM | POA: Diagnosis not present

## 2016-03-01 DIAGNOSIS — G4700 Insomnia, unspecified: Secondary | ICD-10-CM | POA: Diagnosis not present

## 2016-03-01 DIAGNOSIS — M48061 Spinal stenosis, lumbar region without neurogenic claudication: Secondary | ICD-10-CM | POA: Diagnosis not present

## 2016-03-01 DIAGNOSIS — I351 Nonrheumatic aortic (valve) insufficiency: Secondary | ICD-10-CM | POA: Diagnosis not present

## 2016-03-01 DIAGNOSIS — Z9181 History of falling: Secondary | ICD-10-CM | POA: Diagnosis not present

## 2016-03-01 DIAGNOSIS — J441 Chronic obstructive pulmonary disease with (acute) exacerbation: Secondary | ICD-10-CM | POA: Diagnosis not present

## 2016-03-01 DIAGNOSIS — D62 Acute posthemorrhagic anemia: Secondary | ICD-10-CM | POA: Diagnosis not present

## 2016-03-01 DIAGNOSIS — I69391 Dysphagia following cerebral infarction: Secondary | ICD-10-CM | POA: Diagnosis not present

## 2016-03-01 DIAGNOSIS — Z8679 Personal history of other diseases of the circulatory system: Secondary | ICD-10-CM | POA: Diagnosis not present

## 2016-03-01 DIAGNOSIS — R600 Localized edema: Secondary | ICD-10-CM | POA: Diagnosis not present

## 2016-03-01 DIAGNOSIS — I10 Essential (primary) hypertension: Secondary | ICD-10-CM | POA: Diagnosis not present

## 2016-03-01 DIAGNOSIS — M8008XD Age-related osteoporosis with current pathological fracture, vertebra(e), subsequent encounter for fracture with routine healing: Secondary | ICD-10-CM | POA: Diagnosis not present

## 2016-03-01 DIAGNOSIS — I4891 Unspecified atrial fibrillation: Secondary | ICD-10-CM | POA: Diagnosis not present

## 2016-03-01 DIAGNOSIS — R1312 Dysphagia, oropharyngeal phase: Secondary | ICD-10-CM | POA: Diagnosis not present

## 2016-03-01 DIAGNOSIS — M25559 Pain in unspecified hip: Secondary | ICD-10-CM | POA: Diagnosis not present

## 2016-03-01 DIAGNOSIS — R5383 Other fatigue: Secondary | ICD-10-CM | POA: Diagnosis not present

## 2016-03-01 DIAGNOSIS — S32010G Wedge compression fracture of first lumbar vertebra, subsequent encounter for fracture with delayed healing: Secondary | ICD-10-CM | POA: Diagnosis not present

## 2016-03-01 DIAGNOSIS — Z87891 Personal history of nicotine dependence: Secondary | ICD-10-CM | POA: Diagnosis not present

## 2016-03-01 DIAGNOSIS — M810 Age-related osteoporosis without current pathological fracture: Secondary | ICD-10-CM | POA: Diagnosis not present

## 2016-03-01 DIAGNOSIS — R262 Difficulty in walking, not elsewhere classified: Secondary | ICD-10-CM | POA: Diagnosis not present

## 2016-03-01 DIAGNOSIS — J449 Chronic obstructive pulmonary disease, unspecified: Secondary | ICD-10-CM | POA: Diagnosis not present

## 2016-03-01 DIAGNOSIS — M80052D Age-related osteoporosis with current pathological fracture, left femur, subsequent encounter for fracture with routine healing: Secondary | ICD-10-CM | POA: Diagnosis not present

## 2016-03-01 DIAGNOSIS — R6889 Other general symptoms and signs: Secondary | ICD-10-CM | POA: Diagnosis not present

## 2016-03-01 DIAGNOSIS — R05 Cough: Secondary | ICD-10-CM | POA: Diagnosis not present

## 2016-03-01 DIAGNOSIS — I482 Chronic atrial fibrillation: Secondary | ICD-10-CM | POA: Diagnosis not present

## 2016-03-01 DIAGNOSIS — R531 Weakness: Secondary | ICD-10-CM | POA: Diagnosis not present

## 2016-03-01 DIAGNOSIS — F325 Major depressive disorder, single episode, in full remission: Secondary | ICD-10-CM | POA: Diagnosis not present

## 2016-03-01 DIAGNOSIS — K567 Ileus, unspecified: Secondary | ICD-10-CM | POA: Diagnosis not present

## 2016-03-01 DIAGNOSIS — Z79899 Other long term (current) drug therapy: Secondary | ICD-10-CM | POA: Diagnosis not present

## 2016-03-01 DIAGNOSIS — R0989 Other specified symptoms and signs involving the circulatory and respiratory systems: Secondary | ICD-10-CM | POA: Diagnosis not present

## 2016-03-01 DIAGNOSIS — M545 Low back pain: Secondary | ICD-10-CM | POA: Diagnosis not present

## 2016-03-01 DIAGNOSIS — R41841 Cognitive communication deficit: Secondary | ICD-10-CM | POA: Diagnosis not present

## 2016-03-01 DIAGNOSIS — S72142A Displaced intertrochanteric fracture of left femur, initial encounter for closed fracture: Secondary | ICD-10-CM | POA: Diagnosis not present

## 2016-03-01 DIAGNOSIS — D510 Vitamin B12 deficiency anemia due to intrinsic factor deficiency: Secondary | ICD-10-CM | POA: Diagnosis not present

## 2016-03-01 DIAGNOSIS — F419 Anxiety disorder, unspecified: Secondary | ICD-10-CM | POA: Diagnosis not present

## 2016-03-01 DIAGNOSIS — Z7951 Long term (current) use of inhaled steroids: Secondary | ICD-10-CM | POA: Diagnosis not present

## 2016-03-01 DIAGNOSIS — S72002S Fracture of unspecified part of neck of left femur, sequela: Secondary | ICD-10-CM | POA: Diagnosis not present

## 2016-03-01 DIAGNOSIS — D649 Anemia, unspecified: Secondary | ICD-10-CM | POA: Diagnosis not present

## 2016-03-01 DIAGNOSIS — R42 Dizziness and giddiness: Secondary | ICD-10-CM | POA: Diagnosis not present

## 2016-03-01 DIAGNOSIS — Z792 Long term (current) use of antibiotics: Secondary | ICD-10-CM | POA: Diagnosis not present

## 2016-03-01 DIAGNOSIS — G510 Bell's palsy: Secondary | ICD-10-CM | POA: Diagnosis not present

## 2016-03-01 LAB — CBC
HCT: 25.1 % — ABNORMAL LOW (ref 35.0–47.0)
Hemoglobin: 8.3 g/dL — ABNORMAL LOW (ref 12.0–16.0)
MCH: 28.7 pg (ref 26.0–34.0)
MCHC: 33.3 g/dL (ref 32.0–36.0)
MCV: 86.4 fL (ref 80.0–100.0)
PLATELETS: 162 10*3/uL (ref 150–440)
RBC: 2.9 MIL/uL — AB (ref 3.80–5.20)
RDW: 16.9 % — ABNORMAL HIGH (ref 11.5–14.5)
WBC: 8 10*3/uL (ref 3.6–11.0)

## 2016-03-01 LAB — BASIC METABOLIC PANEL
Anion gap: 5 (ref 5–15)
BUN: 27 mg/dL — AB (ref 6–20)
CHLORIDE: 103 mmol/L (ref 101–111)
CO2: 31 mmol/L (ref 22–32)
CREATININE: 1.03 mg/dL — AB (ref 0.44–1.00)
Calcium: 8.1 mg/dL — ABNORMAL LOW (ref 8.9–10.3)
GFR calc Af Amer: 55 mL/min — ABNORMAL LOW (ref >60)
GFR, EST NON AFRICAN AMERICAN: 47 mL/min — AB (ref >60)
GLUCOSE: 112 mg/dL — AB (ref 65–99)
POTASSIUM: 4.3 mmol/L (ref 3.5–5.1)
Sodium: 139 mmol/L (ref 135–145)

## 2016-03-01 LAB — TYPE AND SCREEN
ABO/RH(D): O POS
Antibody Screen: NEGATIVE
Unit division: 0

## 2016-03-01 MED ORDER — DOCUSATE SODIUM 100 MG PO CAPS: 100.0000 mg | ORAL_CAPSULE | Freq: Two times a day (BID) | ORAL | 0 refills | Status: AC

## 2016-03-01 MED ORDER — FERROUS SULFATE 325 (65 FE) MG PO TABS: 325.0000 mg | ORAL_TABLET | Freq: Two times a day (BID) | ORAL | 3 refills | Status: DC

## 2016-03-01 MED ORDER — HEPARIN SODIUM (PORCINE) 5000 UNIT/ML IJ SOLN: 5000.0000 [IU] | Freq: Three times a day (TID) | INTRAMUSCULAR | 0 refills | Status: DC

## 2016-03-01 MED ORDER — HEPARIN SODIUM (PORCINE) 5000 UNIT/ML IJ SOLN: 5000.0000 [IU] | Freq: Three times a day (TID) | INTRAMUSCULAR | Status: DC

## 2016-03-01 MED ORDER — OXYCODONE HCL 5 MG PO TABS: 5.0000 mg | ORAL_TABLET | Freq: Three times a day (TID) | ORAL | 0 refills | Status: DC | PRN

## 2016-03-01 NOTE — Progress Notes (Signed)
Kensett made a follow-up visit with this patient whom he had seen and wanted to find out how the Pt was doing. Pt was visibly in good spirit and was ready to move to [facility] facility where she would be receiving therapy. Pt told chaplain that she would miss the services she received [facility]; a great medical team, including the [facility] visitations that really lifted her spirit (tearful). She asked for prayers for this transition and quick healing, which the [facility] provided.

## 2016-03-01 NOTE — Progress Notes (Signed)
Patient is being discharged to [facility]. Changed dressing. IV removed with cath intact. Report called. Waiting on EMS at this time. Belongings, room bin meds, script sent in packet.

## 2016-03-01 NOTE — Progress Notes (Signed)
Physical Therapy Treatment Patient Details Name: Ana Schultz MRN: [id number] DOB: [birth year] Today's Date: 03-Mar-2016    History of Present Illness Pt. is an 82 y.o. female who was admitted to [facility] with a Left Hip Fracture s/p a fall [facility] [place]. Pt. underwent a LLE intertronchanteris IM nailing.    PT Comments    Pt in bed awaiting EMS transfer to SNF.  Participated in exercises as described below.  Motivated to increase independence and return to ALF.  Follow Up Recommendations  SNF     Equipment Recommendations       Recommendations for Other Services       Precautions / Restrictions Precautions Precautions: Fall Restrictions Weight Bearing Restrictions: Yes LLE Weight Bearing: Partial weight bearing    Mobility  Bed Mobility               General bed mobility comments: deferred - awaiting EMS transfer  Transfers                 General transfer comment: deferred - awaiting EMS  Ambulation/Gait                 Stairs            Wheelchair Mobility    Modified Rankin (Stroke Patients Only)       Balance                                    Cognition Arousal/Alertness: Awake/alert Behavior During Therapy: WFL for tasks assessed/performed Overall Cognitive Status: Within Functional Limits for tasks assessed                      Exercises Total Joint Exercises Ankle Circles/Pumps: Strengthening;Both;20 reps Quad Sets: AAROM;Left;Other reps (comment) Gluteal Sets: AROM;Both;20 reps Short Arc QuadSinclair Schultz;Left;20 reps Heel Slides: AAROM;Left;20 reps Hip ABduction/ADduction: AAROM;Left;20 reps Straight Leg Raises: AAROM;Left;20 reps    General Comments        Pertinent Vitals/Pain Pain Assessment: 0-10 Pain Score: 4  Pain Location: L hip Pain Descriptors / Indicators: Aching Pain Intervention(s): Limited activity within patient's tolerance    Home Living                      Prior  Function            PT Goals (current goals can now be found in the care plan section) Acute Rehab PT Goals Patient Stated Goal: To walk again Progress towards PT goals: Progressing toward goals    Frequency    BID      PT Plan Current plan remains appropriate    Co-evaluation             End of Session   Activity Tolerance: Patient tolerated treatment well Patient left: in bed;with call bell/phone within reach;with bed alarm set     Time: [id number] PT Time Calculation (min) (ACUTE ONLY): 25 min  Charges:  $Therapeutic Exercise: 23-37 mins                    G Codes:      Ana Schultz 03/03/2016, 10:28 AM

## 2016-03-01 NOTE — Clinical Social Work Note (Signed)
Patient to dc to [facility] via non-emergent EMS. Facility is aware. CSW attempted multiple times to contact family and left voicemail. CSW will con't to follow pending dc needs.  Santiago Bumpers, MSW, LCSW-A  [phone]

## 2016-03-01 NOTE — Discharge Summary (Signed)
[facility] NAME: Ana Schultz    MR#:  [id number]  DATE OF BIRTH:  Jan 15, 1926  DATE OF ADMISSION:  02/25/2016 ADMITTING PHYSICIAN: Harrie Foreman, MD  DATE OF DISCHARGE: 03/01/16  PRIMARY CARE PHYSICIAN: Juluis Pitch, MD    ADMISSION DIAGNOSIS:  Closed fracture of left hip, initial encounter ([place]) [S72.002A]  DISCHARGE DIAGNOSIS:  Left Intertrochanteric hip fracture s/p surgery Acute post op blood loss anemia s/p 1 unit BT Paroxysmal afib (eliquis on hold till further instructions from Dr Rockey Situ) Chronic left hemiplegia/paresis due to CVA in the past SECONDARY DIAGNOSIS:   Past Medical History:  Diagnosis Date  . Adenoma of rectum   . Adrenal adenoma   . Anemia   . Atrophic vaginitis   . COPD (chronic obstructive pulmonary disease) ([place])   . Diverticulosis   . Dysphagia   . Frequent UTI   . Hemiplegia and hemiparesis ([place])    following cerebral infarction affecting left non-dominant side  . Microscopic hematuria   . Nonrheumatic aortic valve insufficiency   . Osteoporosis   . Paroxysmal atrial fibrillation ([facility])   . Renal cyst   . Stroke ([place])   . Urge incontinence     HOSPITAL COURSE:   82 year old female admitted for femur fracture.  # Acute on ch anemia -Multifactorial-postoperative/Eliquis/history of polyps on colonoscopy 5 years ago/Hemodilution from IV fluids Hemoglobin-11.1-9.7-7.7-Given blood transfusion-9.9-8.8 concerning for acute blood loss which appears post op anmia Heme occult neg. No evidence of any active bleeding. Vitals stable  Hold  Eliquis  for chronic atrial fibrillation- d/w dr.Gollan, hold until cleared by him, follow up with him 10 days after discharge -d/w GI dr Allen Norris. Agrees with plan.  -will start heparin for DVT prophylaxis (d/w DRS wohl and Krazinski) -hgb monitoring at [facility] -on iron pills  #.Acute intertrochanteric fracture of left pod# 4 Clinically doing  fine Pain management by ortho dr.Miller  #. Hypertension: better today Continue Cardizem   #. Chronic kidney disease: Stage III resume Lasix scheduled bid  Avoid nephrotoxic agents.   #. COPD: Controlled -continue inhaled corticosteroid as well as Spiriva.  -Daily home doseprednisone. Albuterol as necessary.  #. History of ascending aortic aneurysm status post repair in March 2016 and history of paroxysmal atrial fibrillation Cardiac he has recommended  to continue her home medications except eliquis  #. DVT prophylaxis:  scd and heparin (avoiding ASA at present given issues with anemia)  #. GI prophylaxis: PPI per home regimen  PT  is recommending skilled nursing facility  To rehab today D/w patient and she is agreeable  CONSULTS OBTAINED:  Treatment Team:  Earnestine Leys, MD Lucilla Lame, MD Fritzi Mandes, MD  DRUG ALLERGIES:   Allergies  Allergen Reactions  . Iodinated Diagnostic Agents Itching and Swelling  . Ciprofloxacin Other (See Comments)    Colitis  . Nitrofurantoin Diarrhea  . Solifenacin Other (See Comments)    indigestion  . Metronidazole Itching and Rash    DISCHARGE MEDICATIONS:   Current Discharge Medication List    START taking these medications   Details  docusate sodium (COLACE) 100 MG capsule Take 1 capsule (100 mg total) by mouth 2 (two) times daily. Qty: 10 capsule, Refills: 0    ferrous sulfate 325 (65 FE) MG tablet Take 1 tablet (325 mg total) by mouth 2 (two) times daily with a meal. Qty: 60 tablet, Refills: 3    heparin 5000 UNIT/ML injection Inject 1 mL (5,000 Units total) into  the skin every 8 (eight) hours. Qty: 48 mL, Refills: 0      CONTINUE these medications which have CHANGED   Details  oxyCODONE (ROXICODONE) 5 MG immediate release tablet Take 1 tablet (5 mg total) by mouth every 8 (eight) hours as needed for moderate pain or severe pain. Qty: 25 tablet, Refills: 0      CONTINUE these medications which have NOT  CHANGED   Details  !! acetaminophen (TYLENOL) 325 MG tablet Take 650 mg by mouth 4 (four) times daily.     !! acetaminophen (TYLENOL) 325 MG tablet Take 650 mg by mouth every 4 (four) hours as needed for mild pain or fever.    albuterol (PROVENTIL HFA;VENTOLIN HFA) 108 (90 Base) MCG/ACT inhaler Inhale 2 puffs into the lungs every 4 (four) hours as needed for wheezing or shortness of breath. Qty: 1 Inhaler, Refills: 2    Artificial Tear Ointment (ARTIFICIAL TEARS) ointment Place 1 application into the left eye 4 (four) times daily.    atorvastatin (LIPITOR) 20 MG tablet Take 20 mg by mouth at bedtime.     benzonatate (TESSALON) 100 MG capsule Take 100 mg by mouth every 8 (eight) hours as needed for cough.    bisacodyl (DULCOLAX) 10 MG suppository Place 10 mg rectally daily as needed for moderate constipation.     !! clonazePAM (KLONOPIN) 0.5 MG tablet Take 0.25 mg by mouth daily as needed for anxiety.     !! clonazePAM (KLONOPIN) 0.5 MG tablet Take 0.25 mg by mouth daily.    cyanocobalamin 1000 MCG tablet Take 1,000 mcg by mouth daily.    diclofenac sodium (VOLTAREN) 1 % GEL Apply 4 g topically 4 (four) times daily as needed (pain).    diltiazem (CARDIZEM) 60 MG tablet Take 60 mg by mouth daily.    fluticasone-salmeterol (ADVAIR HFA) 230-21 MCG/ACT inhaler Inhale 2 puffs into the lungs 2 (two) times daily.    furosemide (LASIX) 40 MG tablet Take 60 mg by mouth 2 (two) times daily.     ipratropium (ATROVENT) 0.03 % nasal spray Place 2 sprays into both nostrils every 8 (eight) hours as needed for rhinitis.    magnesium hydroxide (MILK OF MAGNESIA) 400 MG/5ML suspension Take 30 mLs by mouth daily as needed for mild constipation.    Melatonin 3 MG TABS Take 6 mg by mouth at bedtime.     methocarbamol (ROBAXIN) 500 MG tablet Take 500 mg by mouth every 6 (six) hours as needed for muscle spasms.    omeprazole (PRILOSEC) 40 MG capsule Take 40 mg by mouth daily.     polyethylene glycol  (MIRALAX) packet Take 17 g by mouth 2 (two) times daily. Qty: 14 each, Refills: 0    polyvinyl alcohol-povidone (HYPOTEARS) 1.4-0.6 % ophthalmic solution Place 1 drop into both eyes as needed (dry eyes).    potassium chloride SA (K-DUR,KLOR-CON) 20 MEQ tablet Take 20 mEq by mouth 2 (two) times daily.     predniSONE (DELTASONE) 20 MG tablet Take 1 tablet (20 mg total) by mouth daily with breakfast. Qty: 30 tablet, Refills: 5    senna (SENOKOT) 8.6 MG TABS tablet Take 1 tablet (8.6 mg total) by mouth daily. Qty: 30 each, Refills: 2    sertraline (ZOLOFT) 50 MG tablet Take 50 mg by mouth daily.    tiotropium (SPIRIVA) 18 MCG inhalation capsule Place 18 mcg into inhaler and inhale daily.    traZODone (DESYREL) 50 MG tablet Take 50 mg by mouth at bedtime as needed  for sleep.    trimethoprim (TRIMPEX) 100 MG tablet Take 100 mg by mouth daily.    fluticasone (FLOVENT HFA) 110 MCG/ACT inhaler Inhale into the lungs.     !! - Potential duplicate medications found. Please discuss with provider.    STOP taking these medications     apixaban (ELIQUIS) 2.5 MG TABS tablet      aspirin EC 81 MG tablet         If you experience worsening of your admission symptoms, develop shortness of breath, life threatening emergency, suicidal or homicidal thoughts you must seek medical attention immediately by calling 911 or calling your MD immediately  if symptoms less severe.  You Must read complete instructions/literature along with all the possible adverse reactions/side effects for all the Medicines you take and that have been prescribed to you. Take any new Medicines after you have completely understood and accept all the possible adverse reactions/side effects.   Please note  You were cared for by a hospitalist during your hospital stay. If you have any questions about your discharge medications or the care you received while you were in the hospital after you are discharged, you can call the unit  and asked to speak with the hospitalist on call if the hospitalist that took care of you is not available. Once you are discharged, your primary care physician will handle any further medical issues. Please note that NO REFILLS for any discharge medications will be authorized once you are discharged, as it is imperative that you return to your primary care physician (or establish a relationship with a primary care physician if you do not have one) for your aftercare needs so that they can reassess your need for medications and monitor your lab values. Today   SUBJECTIVE   I slept very good. Doing well  VITAL SIGNS:  Blood pressure 113/67, pulse 77, temperature 98.3 F (36.8 C), temperature source Oral, resp. rate 18, height 5\' 1"  (1.549 m), weight 61.2 kg (135 lb), SpO2 93 %.  I/O:    Intake/Output Summary (Last 24 hours) at 03/01/16 0851 Last data filed at 03/01/16 0729  Gross per 24 hour  Intake              360 ml  Output              825 ml  Net             -465 ml    PHYSICAL EXAMINATION:  GENERAL:  82 y.o.-year-old patient lying in the bed with no acute distress.  EYES: Pupils equal, round, reactive to light and accommodation. No scleral icterus. Extraocular muscles intact.  HEENT: Head atraumatic, normocephalic. Oropharynx and nasopharynx clear.  NECK:  Supple, no jugular venous distention. No thyroid enlargement, no tenderness.  LUNGS: Normal breath sounds bilaterally, no wheezing, rales,rhonchi or crepitation. No use of accessory muscles of respiration.  CARDIOVASCULAR: S1, S2 normal. No murmurs, rubs, or gallops.  ABDOMEN: Soft, non-tender, non-distended. Bowel sounds present. No organomegaly or mass.  EXTREMITIES: No pedal edema, cyanosis, or clubbing. Left hip dressing OK. bruise + NEUROLOGIC: Cranial nerves II through XII are intact. Muscle strength 5/5 in all extremities. Sensation intact. Gait not checked.  PSYCHIATRIC: The patient is alert and oriented x 3.  SKIN: No  obvious rash, lesion, or ulcer.   DATA REVIEW:   CBC   Recent Labs Lab 03/01/16 0450  WBC 8.0  HGB 8.3*  HCT 25.1*  PLT 162    Chemistries  Recent Labs Lab 02/25/16 1243  03/01/16 0450  NA 140  < > 139  K 3.9  < > 4.3  CL 103  < > 103  CO2 29  < > 31  GLUCOSE 128*  < > 112*  BUN 24*  < > 27*  CREATININE 1.12*  < > 1.03*  CALCIUM 8.7*  < > 8.1*  AST 19  --   --   ALT 32  --   --   ALKPHOS 110  --   --   BILITOT 1.1  --   --   < > = values in this interval not displayed.  Microbiology Results   Recent Results (from the past 240 hour(s))  Surgical PCR screen     Status: None   Collection Time: 02/25/16  3:15 PM  Result Value Ref Range Status   MRSA, PCR NEGATIVE NEGATIVE Final   Staphylococcus aureus NEGATIVE NEGATIVE Final    Comment:        The Xpert SA Assay (FDA approved for NASAL specimens in patients over 37 years of age), is one component of a comprehensive surveillance program.  Test performance has been validated by [facility] for patients greater than or equal to 41 year old. It is not intended to diagnose infection nor to guide or monitor treatment.     RADIOLOGY:  No results found.   Management plans discussed with the patient, family and they are in agreement.  CODE STATUS:     Code Status Orders        Start     Ordered   02/25/16 1127  Full code  Continuous     02/25/16 1126    Code Status History    Date Active Date Inactive Code Status Order ID Comments User Context   02/25/2016  6:22 AM 02/25/2016 11:26 AM Full Code [id number]  Earnestine Leys, MD ED      TOTAL TIME TAKING CARE OF THIS PATIENT: 40 minutes.    Raghad Lorenz M.D on 03/01/2016 at 8:51 AM  Between 7am to 6pm - Pager - [phone] After 6pm go to [email or web address] - password [email or web address] [facility]  Office  [phone]  CC: Primary care physician; Juluis Pitch, MD

## 2016-03-01 NOTE — Progress Notes (Signed)
Tried to call and update family. Unable to reach or leave msg for [employer]. RN informed if family calls just inform me if they have nay questions

## 2016-03-01 NOTE — Discharge Instructions (Signed)
PT  Check hemoglobin in 3-4 days  Pt will be on Heparin for DVT prophylaxis for 14 days

## 2016-03-03 DIAGNOSIS — I4891 Unspecified atrial fibrillation: Secondary | ICD-10-CM | POA: Diagnosis not present

## 2016-03-03 DIAGNOSIS — J449 Chronic obstructive pulmonary disease, unspecified: Secondary | ICD-10-CM | POA: Diagnosis not present

## 2016-03-03 DIAGNOSIS — D649 Anemia, unspecified: Secondary | ICD-10-CM | POA: Diagnosis not present

## 2016-03-03 DIAGNOSIS — F329 Major depressive disorder, single episode, unspecified: Secondary | ICD-10-CM | POA: Diagnosis not present

## 2016-03-03 DIAGNOSIS — M810 Age-related osteoporosis without current pathological fracture: Secondary | ICD-10-CM | POA: Diagnosis not present

## 2016-03-03 DIAGNOSIS — I10 Essential (primary) hypertension: Secondary | ICD-10-CM | POA: Diagnosis not present

## 2016-03-04 ENCOUNTER — Encounter: Payer: Self-pay | Admitting: Physician Assistant

## 2016-03-04 NOTE — Progress Notes (Deleted)
Cardiology Office Note Date:  03/04/2016  Patient ID:  Ana Schultz Jul 29, 1925, MRN [id number] PCP:  Ana Pitch, MD  Cardiologist:  Dr. Rockey Situ, MD  ***refresh   Chief Complaint: Hospital follow up  History of Present Illness: Ana Schultz is a 82 y.o. female with history of ascending aortic aneurysm, hypertension, and recent mechanical fall leading to fractured left hip s/p repair in late October presents for hospital follow up.   The patient underwent replacement of ascending aorta and hemi-arch under deep hypothermic circulatory arrest in 07/2014 after having a LHC in 06/2014 that showed no obstructive CAD. After her aneurysm repair on POD 2 she suffered a basal ganglia, occipital and partietal CVA with right MCA distribution. Deficits included left sided weakness in arm and leg with persistent left upper extremity loss of function. She was reintubated at the time of the stroke and had a prolonged ICU stay with a 3rd reintubation for respiratory failure before eventually leaving the ICU on bipap. The patient has a history of chronic UTIs. During hospitalization she had UCx drawn on 3/21 that were positive for pseudomonas, Morganella morganii and was treated with a 2 week course of Cefepime. She also had post-operative afib and aflutter intermittently during her post-operative course that was managed with diltiazem 60 mg q6h. Her last known episode was 07/23/14. Due to the stroke she was not able to take in adequate nutrition orally; and a PEG tube was placed on 08/08/2014. Initially anticoagulation was held due to planned PEG, but was initiated on 08/10/14. Patient was seen in [place] clinic on 10/05/2014. She was scheduled for a thoracentesis following that appointment. She underwent the procedure and had 337mL removed from left pleural space. At her last follow up she reported significant improvement in SOB and lower extremity edema over the past month.   She was admitted to [facility] on  10/23 after suffering a mechanical fall, slipping on urine leading to a fractured left hip s/p repair on 10/24. In the post-operative course she dropped her hgb to a low of 7.7 on 10/26 requiring transfusion of 1 unit pRBC which led to a hgb of 9.8 later on 10/26 to a hgb of 8.8 on 10/27-->9.6 on recheck 10/27-->8.7 later on 10/27-->8.3 at time of discharge on 10/28. Hemoccult was negative x 3. She was seen by GI and her anemia was not felt to be GI in etiology. Her anemia was felt to be multifactorial including post operative anemia and dilutional. Her Eliquis was held given the anemia until she could follow up with cardiology.  ***   Past Medical History:  Diagnosis Date  . Adenoma of rectum   . Adrenal adenoma   . Anemia   . Atrophic vaginitis   . COPD (chronic obstructive pulmonary disease) ([place])   . Diverticulosis   . Dysphagia   . Frequent UTI   . Hemiplegia and hemiparesis ([place])    following cerebral infarction affecting left non-dominant side  . Microscopic hematuria   . Nonrheumatic aortic valve insufficiency   . Osteoporosis   . Paroxysmal atrial fibrillation ([facility])   . Renal cyst   . Stroke ([place])   . Urge incontinence     Past Surgical History:  Procedure Laterality Date  . ABDOMINAL HYSTERECTOMY    . AORTOILIAC BYPASS    . INTRAMEDULLARY (IM) NAIL INTERTROCHANTERIC Left 02/26/2016   Procedure: INTRAMEDULLARY (IM) NAIL INTERTROCHANTRIC;  Surgeon: Ana Leys, MD;  Location: [facility] ORS;  Service: Orthopedics;  Laterality: Left;  Current Outpatient Prescriptions  Medication Sig Dispense Refill  . acetaminophen (TYLENOL) 325 MG tablet Take 650 mg by mouth 4 (four) times daily.     Marland Kitchen acetaminophen (TYLENOL) 325 MG tablet Take 650 mg by mouth every 4 (four) hours as needed for mild pain or fever.    Marland Kitchen albuterol (PROVENTIL HFA;VENTOLIN HFA) 108 (90 Base) MCG/ACT inhaler Inhale 2 puffs into the lungs every 4 (four) hours as needed for wheezing or shortness of breath. 1  Inhaler 2  . Artificial Tear Ointment (ARTIFICIAL TEARS) ointment Place 1 application into the left eye 4 (four) times daily.    Marland Kitchen atorvastatin (LIPITOR) 20 MG tablet Take 20 mg by mouth at bedtime.     . benzonatate (TESSALON) 100 MG capsule Take 100 mg by mouth every 8 (eight) hours as needed for cough.    . bisacodyl (DULCOLAX) 10 MG suppository Place 10 mg rectally daily as needed for moderate constipation.     . clonazePAM (KLONOPIN) 0.5 MG tablet Take 0.25 mg by mouth daily as needed for anxiety.     . clonazePAM (KLONOPIN) 0.5 MG tablet Take 0.25 mg by mouth daily.    . cyanocobalamin 1000 MCG tablet Take 1,000 mcg by mouth daily.    . diclofenac sodium (VOLTAREN) 1 % GEL Apply 4 g topically 4 (four) times daily as needed (pain).    Marland Kitchen diltiazem (CARDIZEM) 60 MG tablet Take 60 mg by mouth daily.    Marland Kitchen docusate sodium (COLACE) 100 MG capsule Take 1 capsule (100 mg total) by mouth 2 (two) times daily. 10 capsule 0  . ferrous sulfate 325 (65 FE) MG tablet Take 1 tablet (325 mg total) by mouth 2 (two) times daily with a meal. 60 tablet 3  . fluticasone (FLOVENT HFA) 110 MCG/ACT inhaler Inhale into the lungs.    . fluticasone-salmeterol (ADVAIR HFA) 230-21 MCG/ACT inhaler Inhale 2 puffs into the lungs 2 (two) times daily.    . furosemide (LASIX) 40 MG tablet Take 60 mg by mouth 2 (two) times daily.     . heparin 5000 UNIT/ML injection Inject 1 mL (5,000 Units total) into the skin every 8 (eight) hours. 48 mL 0  . ipratropium (ATROVENT) 0.03 % nasal spray Place 2 sprays into both nostrils every 8 (eight) hours as needed for rhinitis.    . magnesium hydroxide (MILK OF MAGNESIA) 400 MG/5ML suspension Take 30 mLs by mouth daily as needed for mild constipation.    . Melatonin 3 MG TABS Take 6 mg by mouth at bedtime.     . methocarbamol (ROBAXIN) 500 MG tablet Take 500 mg by mouth every 6 (six) hours as needed for muscle spasms.    Marland Kitchen omeprazole (PRILOSEC) 40 MG capsule Take 40 mg by mouth daily.     Marland Kitchen  oxyCODONE (ROXICODONE) 5 MG immediate release tablet Take 1 tablet (5 mg total) by mouth every 8 (eight) hours as needed for moderate pain or severe pain. 25 tablet 0  . polyethylene glycol (MIRALAX) packet Take 17 g by mouth 2 (two) times daily. 14 each 0  . polyvinyl alcohol-povidone (HYPOTEARS) 1.4-0.6 % ophthalmic solution Place 1 drop into both eyes as needed (dry eyes).    . potassium chloride SA (K-DUR,KLOR-CON) 20 MEQ tablet Take 20 mEq by mouth 2 (two) times daily.     . predniSONE (DELTASONE) 20 MG tablet Take 1 tablet (20 mg total) by mouth daily with breakfast. 30 tablet 5  . senna (SENOKOT) 8.6 MG TABS tablet Take  1 tablet (8.6 mg total) by mouth daily. 30 each 2  . sertraline (ZOLOFT) 50 MG tablet Take 50 mg by mouth daily.    Marland Kitchen tiotropium (SPIRIVA) 18 MCG inhalation capsule Place 18 mcg into inhaler and inhale daily.    . traZODone (DESYREL) 50 MG tablet Take 50 mg by mouth at bedtime as needed for sleep.    Marland Kitchen trimethoprim (TRIMPEX) 100 MG tablet Take 100 mg by mouth daily.     No current facility-administered medications for this visit.     Allergies:   Iodinated diagnostic agents; Ciprofloxacin; Nitrofurantoin; Solifenacin; and Metronidazole   Social History:  The patient  reports that she has quit smoking. She has never used smokeless tobacco. She reports that she does not drink alcohol or use drugs.   Family History:  The patient's family history includes CAD in her brother and father; Lung cancer in her son.  ROS:   ROS   PHYSICAL EXAM: *** VS:  There were no vitals taken for this visit. BMI: There is no height or weight on file to calculate BMI.  Physical Exam   EKG:  Was ordered and interpreted by me today. Shows ***  Recent Labs: 02/25/2016: ALT 32; TSH 1.050 03/01/2016: BUN 27; Creatinine, Ser 1.03; Hemoglobin 8.3; Platelets 162; Potassium 4.3; Sodium 139  No results found for requested labs within last 8760 hours.   Estimated Creatinine Clearance: 31.7  mL/min (by C-G formula based on SCr of 1.03 mg/dL (H)).   Wt Readings from Last 3 Encounters:  03/01/16 135 lb (61.2 kg)  02/06/16 120 lb (54.4 kg)  01/07/16 125 lb (56.7 kg)     Other studies reviewed: Additional studies/records reviewed today include: summarized above  ASSESSMENT AND PLAN:  1. ***  Disposition: F/u with *** in   Current medicines are reviewed at length with the patient today.  The patient did not have any concerns regarding medicines.  Melvern Banker PA-C 03/04/2016 1:52 PM     [address] [place], [place] [address] [phone]

## 2016-03-05 ENCOUNTER — Encounter
Admission: RE | Admit: 2016-03-05 | Discharge: 2016-03-05 | Disposition: A | Discharge status: Home / Self Care | Payer: Medicare Other | Source: Ambulatory Visit | Attending: Internal Medicine | Admitting: Internal Medicine

## 2016-03-05 ENCOUNTER — Ambulatory Visit: Payer: Self-pay | Admitting: Physician Assistant

## 2016-03-05 ENCOUNTER — Ambulatory Visit: Payer: Medicare Other

## 2016-03-06 ENCOUNTER — Non-Acute Institutional Stay (SKILLED_NURSING_FACILITY): Payer: Medicare Other | Admitting: Gerontology

## 2016-03-06 DIAGNOSIS — S72002S Fracture of unspecified part of neck of left femur, sequela: Secondary | ICD-10-CM

## 2016-03-06 LAB — CBC WITH DIFFERENTIAL/PLATELET
BASOS ABS: 0 10*3/uL (ref 0–0.1)
BASOS PCT: 0 %
Band Neutrophils: 3 %
Blasts: 0 %
EOS PCT: 2 %
Eosinophils Absolute: 0.1 10*3/uL (ref 0–0.7)
HCT: 26 % — ABNORMAL LOW (ref 35.0–47.0)
Hemoglobin: 8.8 g/dL — ABNORMAL LOW (ref 12.0–16.0)
LYMPHS ABS: 0.9 10*3/uL — AB (ref 1.0–3.6)
Lymphocytes Relative: 13 %
MCH: 29.4 pg (ref 26.0–34.0)
MCHC: 33.9 g/dL (ref 32.0–36.0)
MCV: 86.7 fL (ref 80.0–100.0)
METAMYELOCYTES PCT: 2 %
MONO ABS: 0.3 10*3/uL (ref 0.2–0.9)
MYELOCYTES: 0 %
Monocytes Relative: 4 %
Neutro Abs: 5.6 10*3/uL (ref 1.4–6.5)
Neutrophils Relative %: 76 %
Other: 0 %
PLATELETS: 244 10*3/uL (ref 150–440)
PROMYELOCYTES ABS: 0 %
RBC: 3 MIL/uL — ABNORMAL LOW (ref 3.80–5.20)
RDW: 17 % — ABNORMAL HIGH (ref 11.5–14.5)
WBC: 6.9 10*3/uL (ref 3.6–11.0)
nRBC: 0 /100 WBC

## 2016-03-11 ENCOUNTER — Ambulatory Visit (INDEPENDENT_AMBULATORY_CARE_PROVIDER_SITE_OTHER): Payer: Medicare Other | Admitting: Cardiology

## 2016-03-11 ENCOUNTER — Encounter: Payer: Self-pay | Admitting: Cardiology

## 2016-03-11 VITALS — BP 120/52 | HR 71 | Ht 61.0 in | Wt 115.4 lb

## 2016-03-11 DIAGNOSIS — I2510 Atherosclerotic heart disease of native coronary artery without angina pectoris: Secondary | ICD-10-CM | POA: Diagnosis not present

## 2016-03-11 DIAGNOSIS — R011 Cardiac murmur, unspecified: Secondary | ICD-10-CM

## 2016-03-11 DIAGNOSIS — I4891 Unspecified atrial fibrillation: Secondary | ICD-10-CM

## 2016-03-11 NOTE — Progress Notes (Signed)
Cardiology Office Note   Date:  03/11/2016   ID:  Waldemar Dickens, DOB [birth year], MRN [id number]  Referring Doctor:  Juluis Pitch, MD   Cardiologist:   Wende Bushy, MD   Reason for consultation:  Chief Complaint  Patient presents with  . New Patient (Initial Visit)    no cp, sob or swelling. no other comlaints.      History of Present Illness: Ana Schultz is a 82 y.o. female who presents for Follow-up after hospital stay  In 2016, patient underwent aortic aneurysm surgery. She developed stroke postop. Noted to have atrial fibrillation during the hospital stay. Has been on Eliquis 2.5 mg by mouth twice a day.  Recently, hospital stay was for hip fracture repair status post fall. She has been at the rehabilitation facility again. She has been doing well undergone physical therapy. She denies chest pain, shortness of breath, palpitations, orthopnea, edema.   ROS:  Please see the history of present illness. Aside from mentioned under HPI, all other systems are reviewed and negative.     Past Medical History:  Diagnosis Date  . Adenoma of rectum   . Adrenal adenoma   . Anemia   . Ascending aortic aneurysm ([place])    a. s/p repair 07/2014  . Atrophic vaginitis   . COPD (chronic obstructive pulmonary disease) ([place])   . Coronary artery disease, non-occlusive    a. LHC 06/2014 without evidence of obstructive disease  . Diverticulosis   . Dysphagia   . Frequent UTI   . Hemiplegia and hemiparesis ([place])    following cerebral infarction affecting left non-dominant side  . Microscopic hematuria   . Nonrheumatic aortic valve insufficiency   . Osteoporosis   . Paroxysmal atrial fibrillation ([facility])   . Renal cyst   . Stroke [facility])    a. post-operative setting in 07/2014 leading to left UE hemiapresis and left LE weakness  . Urge incontinence     Past Surgical History:  Procedure Laterality Date  . ABDOMINAL HYSTERECTOMY    . AORTOILIAC BYPASS    . INTRAMEDULLARY  (IM) NAIL INTERTROCHANTERIC Left 02/26/2016   Procedure: INTRAMEDULLARY (IM) NAIL INTERTROCHANTRIC;  Surgeon: Earnestine Leys, MD;  Location: [facility] ORS;  Service: Orthopedics;  Laterality: Left;     reports that she has quit smoking. She has never used smokeless tobacco. She reports that she does not drink alcohol or use drugs.   family history includes CAD in her brother and father; Lung cancer in her son.   Outpatient Medications Prior to Visit  Medication Sig Dispense Refill  . acetaminophen (TYLENOL) 325 MG tablet Take 650 mg by mouth every 4 (four) hours as needed for mild pain or fever.    Marland Kitchen albuterol (PROVENTIL HFA;VENTOLIN HFA) 108 (90 Base) MCG/ACT inhaler Inhale 2 puffs into the lungs every 4 (four) hours as needed for wheezing or shortness of breath. 1 Inhaler 2  . Artificial Tear Ointment (ARTIFICIAL TEARS) ointment Place 1 application into the left eye 4 (four) times daily.    Marland Kitchen atorvastatin (LIPITOR) 20 MG tablet Take 20 mg by mouth at bedtime.     . benzonatate (TESSALON) 100 MG capsule Take 100 mg by mouth every 8 (eight) hours as needed for cough.    . bisacodyl (DULCOLAX) 10 MG suppository Place 10 mg rectally daily as needed for moderate constipation.     . clonazePAM (KLONOPIN) 0.5 MG tablet Take 0.25 mg by mouth daily.    . cyanocobalamin 1000 MCG tablet  Take 1,000 mcg by mouth daily.    . diclofenac sodium (VOLTAREN) 1 % GEL Apply 4 g topically 4 (four) times daily as needed (pain).    Marland Kitchen diltiazem (CARDIZEM) 60 MG tablet Take 60 mg by mouth daily.    Marland Kitchen docusate sodium (COLACE) 100 MG capsule Take 1 capsule (100 mg total) by mouth 2 (two) times daily. 10 capsule 0  . ferrous sulfate 325 (65 FE) MG tablet Take 1 tablet (325 mg total) by mouth 2 (two) times daily with a meal. 60 tablet 3  . fluticasone-salmeterol (ADVAIR HFA) 230-21 MCG/ACT inhaler Inhale 2 puffs into the lungs 2 (two) times daily.    . furosemide (LASIX) 40 MG tablet Take 60 mg by mouth 2 (two) times daily.      . heparin 5000 UNIT/ML injection Inject 1 mL (5,000 Units total) into the skin every 8 (eight) hours. 48 mL 0  . ipratropium (ATROVENT) 0.03 % nasal spray Place 2 sprays into both nostrils every 8 (eight) hours as needed for rhinitis.    . magnesium hydroxide (MILK OF MAGNESIA) 400 MG/5ML suspension Take 30 mLs by mouth daily as needed for mild constipation.    . Melatonin 3 MG TABS Take 6 mg by mouth at bedtime.     . methocarbamol (ROBAXIN) 500 MG tablet Take 500 mg by mouth every 6 (six) hours as needed for muscle spasms.    Marland Kitchen omeprazole (PRILOSEC) 40 MG capsule Take 40 mg by mouth daily.     Marland Kitchen oxyCODONE (ROXICODONE) 5 MG immediate release tablet Take 1 tablet (5 mg total) by mouth every 8 (eight) hours as needed for moderate pain or severe pain. 25 tablet 0  . polyethylene glycol (MIRALAX) packet Take 17 g by mouth 2 (two) times daily. 14 each 0  . polyvinyl alcohol-povidone (HYPOTEARS) 1.4-0.6 % ophthalmic solution Place 1 drop into both eyes as needed (dry eyes).    . potassium chloride SA (K-DUR,KLOR-CON) 20 MEQ tablet Take 20 mEq by mouth 2 (two) times daily.     . predniSONE (DELTASONE) 20 MG tablet Take 1 tablet (20 mg total) by mouth daily with breakfast. 30 tablet 5  . senna (SENOKOT) 8.6 MG TABS tablet Take 1 tablet (8.6 mg total) by mouth daily. 30 each 2  . sertraline (ZOLOFT) 50 MG tablet Take 50 mg by mouth daily.    Marland Kitchen tiotropium (SPIRIVA) 18 MCG inhalation capsule Place 18 mcg into inhaler and inhale daily.    . traZODone (DESYREL) 50 MG tablet Take 50 mg by mouth at bedtime as needed for sleep.    Marland Kitchen trimethoprim (TRIMPEX) 100 MG tablet Take 100 mg by mouth daily.    . fluticasone (FLOVENT HFA) 110 MCG/ACT inhaler Inhale into the lungs.    Marland Kitchen acetaminophen (TYLENOL) 325 MG tablet Take 650 mg by mouth 4 (four) times daily.     . clonazePAM (KLONOPIN) 0.5 MG tablet Take 0.25 mg by mouth daily as needed for anxiety.      No facility-administered medications prior to visit.       Allergies: Iodinated diagnostic agents; Ciprofloxacin; Nitrofurantoin; Solifenacin; and Metronidazole    PHYSICAL EXAM: VS:  BP (!) 120/52 (BP Location: Right Arm, Patient Position: Sitting, Cuff Size: Normal)   Pulse 71   Ht 5\' 1"  (1.549 m)   Wt 115 lb 6.4 oz (52.3 kg)   BMI 21.80 kg/m  , Body mass index is 21.8 kg/m. Wt Readings from Last 3 Encounters:  03/11/16 115 lb 6.4 oz (  52.3 kg)  03/01/16 135 lb (61.2 kg)  02/06/16 120 lb (54.4 kg)    GENERAL:  well developed, well nourished, not in acute distress HEENT: normocephalic, pink conjunctivae, anicteric sclerae, no xanthelasma, normal dentition, oropharynx clear NECK:  no neck vein engorgement, JVP normal, no hepatojugular reflux, carotid upstroke brisk and symmetric, no bruit, no thyromegaly, no lymphadenopathy LUNGS:  good respiratory effort, clear to auscultation bilaterally CV:  PMI not displaced, no thrills, no lifts, S1 and S2 within normal limits, no palpable S3 or S4, blowing diastolic murmur 3 out of 6, no rubs, no gallops ABD:  Soft, nontender, nondistended, normoactive bowel sounds, no abdominal aortic bruit, no hepatomegaly, no splenomegaly MS: nontender back, no kyphosis, no scoliosis, no joint deformities EXT:  2+ DP/PT pulses, no edema, no varicosities, no cyanosis, no clubbing SKIN: warm, nondiaphoretic, normal turgor, no ulcers NEUROPSYCH: alert, oriented to person, place, and time, sensory/motor grossly intact, normal mood, appropriate affect  Recent Labs: 02/25/2016: ALT 32; TSH 1.050 03/01/2016: BUN 27; Creatinine, Ser 1.03; Potassium 4.3; Sodium 139 03/06/2016: Hemoglobin 8.8; Platelets 244   Lipid Panel    Component Value Date/Time   CHOL 127 02/09/2015 0349   TRIG 28 02/09/2015 0349   HDL 72 02/09/2015 0349   CHOLHDL 1.8 02/09/2015 0349   VLDL 6 02/09/2015 0349   LDLCALC 49 02/09/2015 0349     Other studies Reviewed:  EKG:  The ekg from 03/11/2016 was personally reviewed by me and it revealed  sinus rhythm, 71 BPM.  Additional studies/ records that were reviewed personally reviewed by me today include:  Echocardiogram 07/14/2014:  Normalleft ventricularejection 0000000) Diastolicleft ventriculardysfunction Degenerativemitral valvedisease Mitralannular calcification Dilatedleft atrium Aorticsclerosis Aorticregurgitation (eccentric,probablymoderate) Contractileright ventriculardysfunction (mild) Tricuspidregurgitation(mild to moderate) Dilatedright atrium  Cardiac cath 2 2016: Non-obstructivecoronaryartery diseasewith ostial LMCAand ostialRCA narrowing thatis likely dueto altered geometry ratherthan from atheroscleroticcoronaryartery disease.This was discussedwith theCT surgery team. Left ventricleend-diastolicpressure iselevated. (LVEDP= 22 mmHg) The axillaryartery appearsnormal and withoutany significant angiographicstenosis.   ASSESSMENT AND PLAN:  Nonobstructive CAD No angina. Continue medical therapy. LDL goal less than 70.  History of ascending  aorta aneurysm repair/graft   History of PAF/flutter Resume Eliquis 2.5 mg by mouth twice a day as soon as okay from surgeon's standpoint. Continue heparin subcutaneous for now.  History of stroke As above, continue eliquis 2.5 mg   Diastolic murmur Recommend echocardiogram  Current medicines are reviewed at length with the patient today.  The patient does not have concerns regarding medicines.  Labs/ tests ordered today include:  Orders Placed This Encounter  Procedures  . EKG 12-Lead  . ECHOCARDIOGRAM COMPLETE    I had a lengthy and detailed discussion with the patient regarding diagnoses, prognosis, diagnostic options, treatment options , and side effects of medications.   I counseled the patient on importance of lifestyle modification including  heart healthy diet, regular physical activity .  I spent at least 40 minutes with the patient today and more than 50% of the time was spent counseling the patient and coordinating care.     Disposition:   FU with undersigned in 6 month   Signed, Wende Bushy, MD  03/11/2016 2:30 PM    [place]  This note was generated in part with voice recognition software and I apologize for any typographical errors that were not detected and corrected.

## 2016-03-11 NOTE — Patient Instructions (Addendum)
Testing/Procedures: Your physician has requested that you have an echocardiogram. Echocardiography is a painless test that uses sound waves to create images of your heart. It provides your doctor with information about the size and shape of your heart and how well your heart's chambers and valves are working. This procedure takes approximately one hour. There are no restrictions for this procedure.    Follow-Up: Your physician wants you to follow-up in: 6 months with Dr. Yvone Neu. You will receive a reminder letter in the mail two months in advance. If you don't receive a letter, please call our office to schedule the follow-up appointment.  It was a pleasure seeing you today here in the office. Please do not hesitate to give [place] a call back if you have any further questions. Rocky Ripple, BSN     Echocardiogram An echocardiogram, or echocardiography, uses sound waves (ultrasound) to produce an image of your heart. The echocardiogram is simple, painless, obtained within a short period of time, and offers valuable information to your health care provider. The images from an echocardiogram can provide information such as:  Evidence of coronary artery disease (CAD).  Heart size.  Heart muscle function.  Heart valve function.  Aneurysm detection.  Evidence of a past heart attack.  Fluid buildup around the heart.  Heart muscle thickening.  Assess heart valve function. LET [facility] CARE PROVIDER KNOW ABOUT:  Any allergies you have.  All medicines you are taking, including vitamins, herbs, eye drops, creams, and over-the-counter medicines.  Previous problems you or members of your family have had with the use of anesthetics.  Any blood disorders you have.  Previous surgeries you have had.  Medical conditions you have.  Possibility of pregnancy, if this applies. BEFORE THE PROCEDURE  No special preparation is needed. Eat and drink normally.  PROCEDURE   In  order to produce an image of your heart, gel will be applied to your chest and a wand-like tool (transducer) will be moved over your chest. The gel will help transmit the sound waves from the transducer. The sound waves will harmlessly bounce off your heart to allow the heart images to be captured in real-time motion. These images will then be recorded.  You may need an IV to receive a medicine that improves the quality of the pictures. AFTER THE PROCEDURE You may return to your normal schedule including diet, activities, and medicines, unless your health care provider tells you otherwise.   This information is not intended to replace advice given to you by your health care provider. Make sure you discuss any questions you have with your health care provider.   Document Released: 04/18/2000 Document Revised: 05/12/2014 Document Reviewed: 12/27/2012 Elsevier Interactive Patient Education [employer].

## 2016-03-12 DIAGNOSIS — S72142A Displaced intertrochanteric fracture of left femur, initial encounter for closed fracture: Secondary | ICD-10-CM | POA: Diagnosis not present

## 2016-03-22 NOTE — Progress Notes (Signed)
Location:      Place of Service:  SNF (31) Provider:  Toni Arthurs, NP-C  DAVID Lovie Macadamia, MD  Patient Care Team: Juluis Pitch, MD as PCP - General (Family Medicine)  Extended Emergency Contact Information Primary Emergency Contact: [name] Address: [address]          [place], [place] [address] [name] of [place] Phone: [phone] Relation: Daughter Secondary Emergency Contact: Lingerfelt,Brad Address: [place], [place] [address] [name] of [place] Phone: [phone] Relation: [name]  Code Status:  full Goals of care: Advanced Directive information Advanced Directives 02/25/2016  Does patient have an advance directive? No  Type of Advance Directive -  Would patient like information on creating an advanced directive? No - patient declined information     Chief Complaint  Patient presents with  . Routine Post Op    HPI:  Pt is a 82 y.o. female seen today for a hospital f/u s/p admission from [facility] for Left Hip fracture and subsequent IM nailing. Pt a resident here LTC until recently when her family moved her to another facility. While there, she fell and sustained a left hip fracture. She is at [facility] for rehab. Pt is currently participating with PT. Pt reports pain is well controlled. No c/o CP/ SOB. Appetite ok. Moving bowels ok. VSS. No other complaints.   Past Medical History:  Diagnosis Date  . Adenoma of rectum   . Adrenal adenoma   . Anemia   . Ascending aortic aneurysm ([place])    a. s/p repair 07/2014  . Atrophic vaginitis   . COPD (chronic obstructive pulmonary disease) ([place])   . Coronary artery disease, non-occlusive    a. LHC 06/2014 without evidence of obstructive disease  . Diverticulosis   . Dysphagia   . Frequent UTI   . Hemiplegia and hemiparesis ([place])    following cerebral infarction affecting left non-dominant side  . Microscopic hematuria   . Nonrheumatic aortic valve insufficiency     . Osteoporosis   . Paroxysmal atrial fibrillation (HCC)   . Renal cyst   . Stroke [facility])    a. post-operative setting in 07/2014 leading to left UE hemiapresis and left LE weakness  . Urge incontinence    Past Surgical History:  Procedure Laterality Date  . ABDOMINAL HYSTERECTOMY    . AORTOILIAC BYPASS    . INTRAMEDULLARY (IM) NAIL INTERTROCHANTERIC Left 02/26/2016   Procedure: INTRAMEDULLARY (IM) NAIL INTERTROCHANTRIC;  Surgeon: Earnestine Leys, MD;  Location: [facility] ORS;  Service: Orthopedics;  Laterality: Left;    Allergies  Allergen Reactions  . Iodinated Diagnostic Agents Itching and Swelling  . Ciprofloxacin Other (See Comments)    Colitis  . Nitrofurantoin Diarrhea  . Solifenacin Other (See Comments)    indigestion  . Metronidazole Itching and Rash      Medication List       Accurate as of 03/06/16 11:59 PM. Always use your most recent med list.          acetaminophen 325 MG tablet Commonly known as:  TYLENOL Take 650 mg by mouth every 4 (four) hours as needed for mild pain or fever.   albuterol 108 (90 Base) MCG/ACT inhaler Commonly known as:  PROVENTIL HFA;VENTOLIN HFA Inhale 2 puffs into the lungs every 4 (four) hours as needed for wheezing or shortness of breath.   artificial tears ointment Place 1 application into the left eye 4 (four) times daily.  atorvastatin 20 MG tablet Commonly known as:  LIPITOR Take 20 mg by mouth at bedtime.   benzonatate 100 MG capsule Commonly known as:  TESSALON Take 100 mg by mouth every 8 (eight) hours as needed for cough.   bisacodyl 10 MG suppository Commonly known as:  DULCOLAX Place 10 mg rectally daily as needed for moderate constipation.   clonazePAM 0.5 MG tablet Commonly known as:  KLONOPIN Take 0.25 mg by mouth daily.   cyanocobalamin 1000 MCG tablet Take 1,000 mcg by mouth daily.   diclofenac sodium 1 % Gel Commonly known as:  VOLTAREN Apply 4 g topically 4 (four) times daily as needed (pain).    diltiazem 60 MG tablet Commonly known as:  CARDIZEM Take 60 mg by mouth daily.   docusate sodium 100 MG capsule Commonly known as:  COLACE Take 1 capsule (100 mg total) by mouth 2 (two) times daily.   ferrous sulfate 325 (65 FE) MG tablet Take 1 tablet (325 mg total) by mouth 2 (two) times daily with a meal.   fluticasone-salmeterol 230-21 MCG/ACT inhaler Commonly known as:  ADVAIR HFA Inhale 2 puffs into the lungs 2 (two) times daily.   furosemide 40 MG tablet Commonly known as:  LASIX Take 60 mg by mouth 2 (two) times daily.   heparin 5000 UNIT/ML injection Inject 1 mL (5,000 Units total) into the skin every 8 (eight) hours.   ipratropium 0.03 % nasal spray Commonly known as:  ATROVENT Place 2 sprays into both nostrils every 8 (eight) hours as needed for rhinitis.   magnesium hydroxide 400 MG/5ML suspension Commonly known as:  MILK OF MAGNESIA Take 30 mLs by mouth daily as needed for mild constipation.   Melatonin 3 MG Tabs Take 6 mg by mouth at bedtime.   methocarbamol 500 MG tablet Commonly known as:  ROBAXIN Take 500 mg by mouth every 6 (six) hours as needed for muscle spasms.   omeprazole 40 MG capsule Commonly known as:  PRILOSEC Take 40 mg by mouth daily.   oxyCODONE 5 MG immediate release tablet Commonly known as:  ROXICODONE Take 1 tablet (5 mg total) by mouth every 8 (eight) hours as needed for moderate pain or severe pain.   polyethylene glycol packet Commonly known as:  MIRALAX Take 17 g by mouth 2 (two) times daily.   polyvinyl alcohol-povidone 1.4-0.6 % ophthalmic solution Commonly known as:  HYPOTEARS Place 1 drop into both eyes as needed (dry eyes).   potassium chloride SA 20 MEQ tablet Commonly known as:  K-DUR,KLOR-CON Take 20 mEq by mouth 2 (two) times daily.   predniSONE 20 MG tablet Commonly known as:  DELTASONE Take 1 tablet (20 mg total) by mouth daily with breakfast.   senna 8.6 MG Tabs tablet Commonly known as:  SENOKOT Take 1  tablet (8.6 mg total) by mouth daily.   sertraline 50 MG tablet Commonly known as:  ZOLOFT Take 50 mg by mouth daily.   tiotropium 18 MCG inhalation capsule Commonly known as:  SPIRIVA Place 18 mcg into inhaler and inhale daily.   traZODone 50 MG tablet Commonly known as:  DESYREL Take 50 mg by mouth at bedtime as needed for sleep.   trimethoprim 100 MG tablet Commonly known as:  TRIMPEX Take 100 mg by mouth daily.       Review of Systems  Constitutional: Negative for activity change, appetite change, chills, diaphoresis and fever.  HENT: Negative for congestion, sneezing, sore throat, trouble swallowing and voice change.   Eyes:  Negative.   Respiratory: Negative for apnea, cough, choking, chest tightness, shortness of breath and wheezing.   Cardiovascular: Negative for chest pain, palpitations and leg swelling.  Gastrointestinal: Negative for abdominal distention, abdominal pain, constipation, diarrhea and nausea.  Endocrine: Negative.   Genitourinary: Negative for difficulty urinating, dysuria, frequency and urgency.  Musculoskeletal: Positive for arthralgias (typical arthritis). Negative for back pain, gait problem and myalgias.  Skin: Negative for color change, pallor, rash and wound.  Neurological: Negative for dizziness, tremors, syncope, speech difficulty, weakness, numbness and headaches.  Psychiatric/Behavioral: Negative for agitation and behavioral problems.  All other systems reviewed and are negative.   Immunization History  Administered Date(s) Administered  . Influenza-Unspecified 02/02/2014   Pertinent  Health Maintenance Due  Topic Date Due  . DEXA SCAN  07/01/1992  . PNA vac Low Risk Adult (1 of 2 - PCV13) 07/01/1992  . INFLUENZA VACCINE  12/04/2015   No flowsheet data found. Functional Status Survey:    Vitals:   03/06/16 0957  BP: (!) 85/45  Pulse: 69  Resp: 16  Temp: 97.9 F (36.6 C)  SpO2: 98%  Weight: 114 lb 6.4 oz (51.9 kg)   Body  mass index is 21.62 kg/m. Physical Exam  Constitutional: She is oriented to person, place, and time. Vital signs are normal. She appears well-developed and well-nourished. She is active and cooperative. She does not appear ill. No distress.  HENT:  Head: Normocephalic and atraumatic.  Mouth/Throat: Uvula is midline, oropharynx is clear and moist and mucous membranes are normal. Mucous membranes are not pale, not dry and not cyanotic.  Eyes: Conjunctivae, EOM and lids are normal. Pupils are equal, round, and reactive to light.  Neck: Trachea normal, normal range of motion and full passive range of motion without pain. Neck supple. No JVD present. No tracheal deviation, no edema and no erythema present. No thyromegaly present.  Cardiovascular: Normal rate, regular rhythm, normal heart sounds, intact distal pulses and normal pulses.  Exam reveals no gallop, no distant heart sounds and no friction rub.   No murmur heard. Pulmonary/Chest: Effort normal and breath sounds normal. No accessory muscle usage. No respiratory distress. She has no decreased breath sounds. She has no wheezes. She has no rhonchi. She has no rales. She exhibits no tenderness.  Abdominal: Soft. Normal appearance and bowel sounds are normal. She exhibits no distension and no ascites. There is no tenderness.  Musculoskeletal: Normal range of motion. She exhibits no edema or tenderness.  Expected osteoarthritis, stiffness  Neurological: She is alert and oriented to person, place, and time. She has normal strength.  Skin: Skin is warm, dry and intact. She is not diaphoretic. No cyanosis. No pallor. Nails show no clubbing.  Psychiatric: She has a normal mood and affect. Her speech is normal and behavior is normal. Judgment and thought content normal. Cognition and memory are normal.  Nursing note and vitals reviewed.   Labs reviewed:  Recent Labs  02/28/16 0343 02/29/16 0427 03/01/16 0450  NA 140 140 139  K 3.5 4.1 4.3  CL  105 104 103  CO2 27 30 31   GLUCOSE 112* 114* 112*  BUN 27* 28* 27*  CREATININE 1.01* 1.19* 1.03*  CALCIUM 8.3* 8.1* 8.1*    Recent Labs  02/25/16 0325 02/25/16 1243  AST 20 19  ALT 34 32  ALKPHOS 131* 110  BILITOT 0.8 1.1  PROT 7.0 6.1*  ALBUMIN 4.1 3.6    Recent Labs  01/26/16 1208  02/25/16 1243  02/29/16 0427  02/29/16 2229 03/01/16 0450 03/06/16 0540  WBC 14.0*  < > 11.4*  < > 8.9  --   --  8.0 6.9  NEUTROABS 12.5*  --  9.6*  --   --   --   --   --  5.6  HGB 11.2*  < > 11.1*  < > 8.8*  < > 8.7* 8.3* 8.8*  HCT 32.9*  < > 33.2*  < > 26.5*  < > 26.4* 25.1* 26.0*  MCV 84.6  < > 87.1  < > 87.5  --   --  86.4 86.7  PLT 259  < > 177  < > 153  --   --  162 244  < > = values in this interval not displayed. Lab Results  Component Value Date   TSH 1.050 02/25/2016   Lab Results  Component Value Date   HGBA1C 5.9 (H) 02/25/2016   Lab Results  Component Value Date   CHOL 127 02/09/2015   HDL 72 02/09/2015   LDLCALC 49 02/09/2015   TRIG 28 02/09/2015   CHOLHDL 1.8 02/09/2015    Significant Diagnostic Results in last 30 days:  No results found.  Assessment/Plan 1. Closed fracture of left hip, sequela  Continue working with PT  Continue working with OT  Heparin SQ for DVT prophylaxis  Monitor Hgb  Oxycodone 5 mg po Q 8 hours prn pain  Tylenol prn for mild pain   Family/ staff Communication:   Total Time:  Documentation:  Face to Face:  Family/Phone:   Labs/tests ordered:    Vikki Ports, NP-C [facility] [address], [place] [address] Cell Phone (Mon-Fri 8am-5pm):  [phone] On Call:  [phone] & follow prompts after 5pm & weekends Office Phone:  [phone] Office Fax:  [phone]

## 2016-03-31 ENCOUNTER — Non-Acute Institutional Stay (SKILLED_NURSING_FACILITY): Payer: Medicare Other | Admitting: Gerontology

## 2016-03-31 DIAGNOSIS — S32010A Wedge compression fracture of first lumbar vertebra, initial encounter for closed fracture: Secondary | ICD-10-CM

## 2016-03-31 DIAGNOSIS — K567 Ileus, unspecified: Secondary | ICD-10-CM | POA: Diagnosis not present

## 2016-04-02 ENCOUNTER — Other Ambulatory Visit: Payer: Self-pay | Admitting: Gerontology

## 2016-04-02 DIAGNOSIS — R262 Difficulty in walking, not elsewhere classified: Secondary | ICD-10-CM

## 2016-04-02 DIAGNOSIS — M80052D Age-related osteoporosis with current pathological fracture, left femur, subsequent encounter for fracture with routine healing: Secondary | ICD-10-CM

## 2016-04-02 DIAGNOSIS — Z9181 History of falling: Secondary | ICD-10-CM

## 2016-04-03 DIAGNOSIS — S32020S Wedge compression fracture of second lumbar vertebra, sequela: Secondary | ICD-10-CM | POA: Insufficient documentation

## 2016-04-03 NOTE — Progress Notes (Signed)
Location:      Place of Service:  SNF (31) Provider:  Toni Arthurs, NP-C  DAVID Lovie Macadamia, MD  Patient Care Team: Juluis Pitch, MD as PCP - General (Family Medicine)  Extended Emergency Contact Information Primary Emergency Contact: [name] Address: [address]          [place], [place] [address] [name] of [place] Phone: [phone] Relation: Daughter Secondary Emergency Contact: Lingerfelt,Brad Address: [place] [address] [name] of [place] Phone: [phone] Relation: [name]  Code Status:  full Goals of care: Advanced Directive information Advanced Directives 02/25/2016  Does Patient Have a Medical Advance Directive? No  Type of Advance Directive -  Would patient like information on creating a medical advance directive? No - patient declined information     Chief Complaint  Patient presents with  . Acute Visit    HPI:  Pt is a 82 y.o. female seen today for an acute visit for sudden onset back pain. Pt reports she was rolling her wheelchair, as usual, when she felt a sudden pain in her back and generalized weakness- to the point that she had to have someone help her back to her room. She rates her pain 8/10. Sharp. Disrupting her sleep. She denies trauma. Denies feeling a pop, etc. Sensation in BLE is normal for her baseline. Pt reports she had a BM last evening, but had been several days before that. Large gaseous pattern in intestines seen on xray. Abdomen distended, firm but pliable. Non-tender to palpation. No other complaints. VSS      Past Medical History:  Diagnosis Date  . Adenoma of rectum   . Adrenal adenoma   . Anemia   . Ascending aortic aneurysm ([place])    a. s/p repair 07/2014  . Atrophic vaginitis   . COPD (chronic obstructive pulmonary disease) ([place])   . Coronary artery disease, non-occlusive    a. LHC 06/2014 without evidence of obstructive disease  . Diverticulosis   . Dysphagia     . Frequent UTI   . Hemiplegia and hemiparesis ([place])    following cerebral infarction affecting left non-dominant side  . Microscopic hematuria   . Nonrheumatic aortic valve insufficiency   . Osteoporosis   . Paroxysmal atrial fibrillation (HCC)   . Renal cyst   . Stroke [facility])    a. post-operative setting in 07/2014 leading to left UE hemiapresis and left LE weakness  . Urge incontinence    Past Surgical History:  Procedure Laterality Date  . ABDOMINAL HYSTERECTOMY    . AORTOILIAC BYPASS    . INTRAMEDULLARY (IM) NAIL INTERTROCHANTERIC Left 02/26/2016   Procedure: INTRAMEDULLARY (IM) NAIL INTERTROCHANTRIC;  Surgeon: Earnestine Leys, MD;  Location: [facility] ORS;  Service: Orthopedics;  Laterality: Left;    Allergies  Allergen Reactions  . Iodinated Diagnostic Agents Itching and Swelling  . Ciprofloxacin Other (See Comments)    Colitis  . Nitrofurantoin Diarrhea  . Solifenacin Other (See Comments)    indigestion  . Metronidazole Itching and Rash      Medication List       Accurate as of 03/31/16 11:59 PM. Always use your most recent med list.          acetaminophen 325 MG tablet Commonly known as:  TYLENOL Take 650 mg by mouth every 4 (four) hours as needed for mild pain or fever.   albuterol 108 (90 Base) MCG/ACT inhaler Commonly known as:  PROVENTIL HFA;VENTOLIN HFA Inhale 2 puffs into the lungs every 4 (four) hours as needed for wheezing or shortness of breath.   artificial tears ointment Place 1 application into the left eye 4 (four) times daily.   atorvastatin 20 MG tablet Commonly known as:  LIPITOR Take 20 mg by mouth at bedtime.   benzonatate 100 MG capsule Commonly known as:  TESSALON Take 100 mg by mouth every 8 (eight) hours as needed for cough.   bisacodyl 10 MG suppository Commonly known as:  DULCOLAX Place 10 mg rectally daily as needed for moderate constipation.   clonazePAM 0.5 MG tablet Commonly known as:  KLONOPIN Take 0.25 mg by mouth  daily.   cyanocobalamin 1000 MCG tablet Take 1,000 mcg by mouth daily.   diclofenac sodium 1 % Gel Commonly known as:  VOLTAREN Apply 4 g topically 4 (four) times daily as needed (pain).   diltiazem 60 MG tablet Commonly known as:  CARDIZEM Take 60 mg by mouth daily.   docusate sodium 100 MG capsule Commonly known as:  COLACE Take 1 capsule (100 mg total) by mouth 2 (two) times daily.   ferrous sulfate 325 (65 FE) MG tablet Take 1 tablet (325 mg total) by mouth 2 (two) times daily with a meal.   fluticasone-salmeterol 230-21 MCG/ACT inhaler Commonly known as:  ADVAIR HFA Inhale 2 puffs into the lungs 2 (two) times daily.   furosemide 40 MG tablet Commonly known as:  LASIX Take 60 mg by mouth 2 (two) times daily.   ipratropium 0.03 % nasal spray Commonly known as:  ATROVENT Place 2 sprays into both nostrils every 8 (eight) hours as needed for rhinitis.   magnesium hydroxide 400 MG/5ML suspension Commonly known as:  MILK OF MAGNESIA Take 30 mLs by mouth daily as needed for mild constipation.   Melatonin 3 MG Tabs Take 6 mg by mouth at bedtime.   methocarbamol 500 MG tablet Commonly known as:  ROBAXIN Take 500 mg by mouth every 6 (six) hours as needed for muscle spasms.   omeprazole 40 MG capsule Commonly known as:  PRILOSEC Take 40 mg by mouth daily.   oxyCODONE 5 MG immediate release tablet Commonly known as:  ROXICODONE Take 1 tablet (5 mg total) by mouth every 8 (eight) hours as needed for moderate pain or severe pain.   polyethylene glycol packet Commonly known as:  MIRALAX Take 17 g by mouth 2 (two) times daily.   polyvinyl alcohol-povidone 1.4-0.6 % ophthalmic solution Commonly known as:  HYPOTEARS Place 1 drop into both eyes as needed (dry eyes).   potassium chloride SA 20 MEQ tablet Commonly known as:  K-DUR,KLOR-CON Take 20 mEq by mouth 2 (two) times daily.   predniSONE 20 MG tablet Commonly known as:  DELTASONE Take 1 tablet (20 mg total) by  mouth daily with breakfast.   senna 8.6 MG Tabs tablet Commonly known as:  SENOKOT Take 1 tablet (8.6 mg total) by mouth daily.   sertraline 50 MG tablet Commonly known as:  ZOLOFT Take 50 mg by mouth daily.   tiotropium 18 MCG inhalation capsule Commonly known as:  SPIRIVA Place 18 mcg into inhaler and inhale daily.   traZODone 50 MG tablet Commonly known as:  DESYREL Take 50 mg by mouth at bedtime as needed for sleep.   trimethoprim 100 MG tablet Commonly known as:  TRIMPEX Take 100 mg by mouth daily.       Review of Systems  Constitutional: Negative for activity change, appetite change,  chills, diaphoresis and fever.  HENT: Negative for congestion, sneezing, sore throat, trouble swallowing and voice change.   Eyes: Negative.   Respiratory: Negative for apnea, cough, choking, chest tightness, shortness of breath and wheezing.   Cardiovascular: Negative for chest pain, palpitations and leg swelling.  Gastrointestinal: Negative for abdominal distention, abdominal pain, constipation, diarrhea, nausea and vomiting.  Endocrine: Negative.   Genitourinary: Negative for difficulty urinating, dysuria, frequency and urgency.  Musculoskeletal: Positive for arthralgias (typical arthritis), back pain, gait problem and myalgias.  Skin: Negative for color change, pallor, rash and wound.  Neurological: Negative for dizziness, tremors, syncope, speech difficulty, weakness, numbness and headaches.  Psychiatric/Behavioral: Negative.   All other systems reviewed and are negative.   Immunization History  Administered Date(s) Administered  . Influenza-Unspecified 02/02/2014   Pertinent  Health Maintenance Due  Topic Date Due  . DEXA SCAN  07/01/1992  . PNA vac Low Risk Adult (1 of 2 - PCV13) 07/01/1992  . INFLUENZA VACCINE  12/04/2015   No flowsheet data found. Functional Status Survey:    Vitals:   03/26/16 1836  BP: (!) 164/72  Pulse: 72  Resp: 16  Temp: 97.6 F (36.4 C)   SpO2: 98%  Weight: 115 lb 1.6 oz (52.2 kg)   Body mass index is 21.75 kg/m. Physical Exam  Constitutional: She is oriented to person, place, and time. Vital signs are normal. She appears well-developed and well-nourished. She is active and cooperative. She does not appear ill. No distress.  HENT:  Head: Normocephalic and atraumatic.  Mouth/Throat: Uvula is midline, oropharynx is clear and moist and mucous membranes are normal. Mucous membranes are not pale, not dry and not cyanotic.  Eyes: Conjunctivae, EOM and lids are normal. Pupils are equal, round, and reactive to light.  Neck: Trachea normal, normal range of motion and full passive range of motion without pain. Neck supple. No JVD present. No tracheal deviation, no edema and no erythema present. No thyromegaly present.  Cardiovascular: Normal rate, regular rhythm, normal heart sounds, intact distal pulses and normal pulses.  Exam reveals no gallop, no distant heart sounds and no friction rub.   No murmur heard. Pulmonary/Chest: Effort normal and breath sounds normal. No accessory muscle usage. No respiratory distress. She has no decreased breath sounds. She has no wheezes. She has no rhonchi. She has no rales. She exhibits no tenderness.  Abdominal: Soft. Normal appearance. She exhibits distension. She exhibits no ascites and no mass. Bowel sounds are decreased. There is no tenderness.  Musculoskeletal: She exhibits no edema.       Lumbar back: She exhibits decreased range of motion, bony tenderness, pain and spasm.  Expected osteoarthritis, stiffness  Neurological: She is alert and oriented to person, place, and time. She has normal strength.  Skin: Skin is warm, dry and intact. She is not diaphoretic. No cyanosis. No pallor. Nails show no clubbing.  Psychiatric: She has a normal mood and affect. Her speech is normal and behavior is normal. Judgment and thought content normal. Cognition and memory are normal.  Nursing note and vitals  reviewed.   Labs reviewed:  Recent Labs  02/28/16 0343 02/29/16 0427 03/01/16 0450  NA 140 140 139  K 3.5 4.1 4.3  CL 105 104 103  CO2 27 30 31   GLUCOSE 112* 114* 112*  BUN 27* 28* 27*  CREATININE 1.01* 1.19* 1.03*  CALCIUM 8.3* 8.1* 8.1*    Recent Labs  02/25/16 0325 02/25/16 1243  AST 20 19  ALT 34 32  ALKPHOS 131* 110  BILITOT 0.8 1.1  PROT 7.0 6.1*  ALBUMIN 4.1 3.6    Recent Labs  01/26/16 1208  02/25/16 1243  02/29/16 0427  02/29/16 2229 03/01/16 0450 03/06/16 0540  WBC 14.0*  < > 11.4*  < > 8.9  --   --  8.0 6.9  NEUTROABS 12.5*  --  9.6*  --   --   --   --   --  5.6  HGB 11.2*  < > 11.1*  < > 8.8*  < > 8.7* 8.3* 8.8*  HCT 32.9*  < > 33.2*  < > 26.5*  < > 26.4* 25.1* 26.0*  MCV 84.6  < > 87.1  < > 87.5  --   --  86.4 86.7  PLT 259  < > 177  < > 153  --   --  162 244  < > = values in this interval not displayed. Lab Results  Component Value Date   TSH 1.050 02/25/2016   Lab Results  Component Value Date   HGBA1C 5.9 (H) 02/25/2016   Lab Results  Component Value Date   CHOL 127 02/09/2015   HDL 72 02/09/2015   LDLCALC 49 02/09/2015   TRIG 28 02/09/2015   CHOLHDL 1.8 02/09/2015    Significant Diagnostic Results in last 30 days:  No results found.  Assessment/Plan 1. Closed compression fracture of first lumbar vertebra, initial encounter (HCC)  Lidoderm 5% patch to area of pain daily, remove after 12 hours  Tylenol 650 mg po QID  Methocarbamol 500 mg po Q 6 hours prn  Oxycodone 5 mg 1 tablet po Q 4 hours prn pain- #120, no refill  Schedule MRI of the lumbar spine without contrast to evaluate compression fracture  Likely refer to neurosurgeon/ orthopedist to evaluate for appropriateness of a kyphoplasty, etc.  No restrictions for PT- do as pt tolerates  2. Ileus (HCC)  NPO except meds and clear liquids  IV NS at 75 mL/ hr  Reglan 10 mg IV Q 8 hours  Bisacodyl 10 mg suppositories BID until regular BMs    Family/ staff  Communication:   Total Time:  Documentation:  Face to Face:  Family/Phone:   Labs/tests ordered:  Complete lumbar/ sacral xrays, B- hip xrays, KUB, MRI without contrast of the lumbar spine  Medication list reviewed and assessed for continued appropriateness.  Vikki Ports, NP-C [facility] [phone], [place] [address] Cell Phone (Mon-Fri 8am-5pm):  [phone] On Call:  [phone] & follow prompts after 5pm & weekends Office Phone:  [phone] Office Fax:  [phone]

## 2016-04-04 ENCOUNTER — Encounter
Admission: RE | Admit: 2016-04-04 | Discharge: 2016-04-04 | Disposition: A | Discharge status: Home / Self Care | Payer: Medicare Other | Source: Ambulatory Visit | Attending: Internal Medicine | Admitting: Internal Medicine

## 2016-04-07 ENCOUNTER — Non-Acute Institutional Stay (SKILLED_NURSING_FACILITY): Payer: Medicare Other | Admitting: Gerontology

## 2016-04-07 DIAGNOSIS — K567 Ileus, unspecified: Secondary | ICD-10-CM | POA: Diagnosis not present

## 2016-04-07 DIAGNOSIS — S32010G Wedge compression fracture of first lumbar vertebra, subsequent encounter for fracture with delayed healing: Secondary | ICD-10-CM | POA: Diagnosis not present

## 2016-04-09 ENCOUNTER — Other Ambulatory Visit: Payer: Self-pay

## 2016-04-12 NOTE — Progress Notes (Signed)
Location:      Place of Service:  SNF (31) Provider:  Toni Arthurs, NP-C  DAVID Lovie Macadamia, MD  Patient Care Team: Juluis Pitch, MD as PCP - General (Family Medicine)  Extended Emergency Contact Information Primary Emergency Contact: [name] Address: [address]          [place], [place] [address] [name] of [place] Phone: [phone] Relation: Daughter Secondary Emergency Contact: Lingerfelt,Brad Address: [place], [place] [address] [name] of [place] Phone: [phone] Relation: [name]  Code Status:  full Goals of care: Advanced Directive information Advanced Directives 02/25/2016  Does Patient Have a Medical Advance Directive? No  Type of Advance Directive -  Would patient like information on creating a medical advance directive? No - patient declined information     Chief Complaint  Patient presents with  . Acute Visit    HPI:  Pt is a 82 y.o. female seen today for Follow-up visit for sudden onset back pain. Pt reports she was rolling her wheelchair, as usual, when she felt a sudden pain in her back and generalized weakness- to the point that she had to have someone help her back to her room. She rates her pain 8/10. Sharp. Disrupting her sleep. She denies trauma. Denies feeling a pop, etc. Sensation in BLE is normal for her baseline. Large gaseous pattern in intestines seen on xray. Abdomen was distended, firm but pliable. Non-tender to palpation. Patient received several days of IV fluids, IV Reglan, nothing by mouth except clear liquids and twice a day this could ill suppositories until she was having regular bowel movements. Patient reports symptoms have resolved. She says she is feeling much better No other complaints. VSS      Past Medical History:  Diagnosis Date  . Adenoma of rectum   . Adrenal adenoma   . Anemia   . Ascending aortic aneurysm ([place])    a. s/p repair 07/2014  . Atrophic vaginitis   .  COPD (chronic obstructive pulmonary disease) ([place])   . Coronary artery disease, non-occlusive    a. LHC 06/2014 without evidence of obstructive disease  . Diverticulosis   . Dysphagia   . Frequent UTI   . Hemiplegia and hemiparesis ([place])    following cerebral infarction affecting left non-dominant side  . Microscopic hematuria   . Nonrheumatic aortic valve insufficiency   . Osteoporosis   . Paroxysmal atrial fibrillation (HCC)   . Renal cyst   . Stroke [facility])    a. post-operative setting in 07/2014 leading to left UE hemiapresis and left LE weakness  . Urge incontinence    Past Surgical History:  Procedure Laterality Date  . ABDOMINAL HYSTERECTOMY    . AORTOILIAC BYPASS    . INTRAMEDULLARY (IM) NAIL INTERTROCHANTERIC Left 02/26/2016   Procedure: INTRAMEDULLARY (IM) NAIL INTERTROCHANTRIC;  Surgeon: Earnestine Leys, MD;  Location: [facility] ORS;  Service: Orthopedics;  Laterality: Left;    Allergies  Allergen Reactions  . Iodinated Diagnostic Agents Itching and Swelling  . Ciprofloxacin Other (See Comments)    Colitis  . Nitrofurantoin Diarrhea  . Solifenacin Other (See Comments)    indigestion  . Metronidazole Itching and Rash      Medication List       Accurate as of 04/07/16 11:59 PM. Always use your most recent med list.          acetaminophen 325 MG tablet Commonly known as:  TYLENOL Take 650  mg by mouth every 4 (four) hours as needed for mild pain or fever.   albuterol 108 (90 Base) MCG/ACT inhaler Commonly known as:  PROVENTIL HFA;VENTOLIN HFA Inhale 2 puffs into the lungs every 4 (four) hours as needed for wheezing or shortness of breath.   artificial tears ointment Place 1 application into the left eye 4 (four) times daily.   atorvastatin 20 MG tablet Commonly known as:  LIPITOR Take 20 mg by mouth at bedtime.   benzonatate 100 MG capsule Commonly known as:  TESSALON Take 100 mg by mouth every 8 (eight) hours as needed for cough.   bisacodyl 10 MG  suppository Commonly known as:  DULCOLAX Place 10 mg rectally daily as needed for moderate constipation.   clonazePAM 0.5 MG tablet Commonly known as:  KLONOPIN Take 0.25 mg by mouth daily.   cyanocobalamin 1000 MCG tablet Take 1,000 mcg by mouth daily.   diclofenac sodium 1 % Gel Commonly known as:  VOLTAREN Apply 4 g topically 4 (four) times daily as needed (pain).   diltiazem 60 MG tablet Commonly known as:  CARDIZEM Take 60 mg by mouth daily.   docusate sodium 100 MG capsule Commonly known as:  COLACE Take 1 capsule (100 mg total) by mouth 2 (two) times daily.   ferrous sulfate 325 (65 FE) MG tablet Take 1 tablet (325 mg total) by mouth 2 (two) times daily with a meal.   fluticasone-salmeterol 230-21 MCG/ACT inhaler Commonly known as:  ADVAIR HFA Inhale 2 puffs into the lungs 2 (two) times daily.   furosemide 40 MG tablet Commonly known as:  LASIX Take 60 mg by mouth 2 (two) times daily.   ipratropium 0.03 % nasal spray Commonly known as:  ATROVENT Place 2 sprays into both nostrils every 8 (eight) hours as needed for rhinitis.   magnesium hydroxide 400 MG/5ML suspension Commonly known as:  MILK OF MAGNESIA Take 30 mLs by mouth daily as needed for mild constipation.   Melatonin 3 MG Tabs Take 6 mg by mouth at bedtime.   methocarbamol 500 MG tablet Commonly known as:  ROBAXIN Take 500 mg by mouth every 6 (six) hours as needed for muscle spasms.   omeprazole 40 MG capsule Commonly known as:  PRILOSEC Take 40 mg by mouth daily.   oxyCODONE 5 MG immediate release tablet Commonly known as:  ROXICODONE Take 1 tablet (5 mg total) by mouth every 8 (eight) hours as needed for moderate pain or severe pain.   polyethylene glycol packet Commonly known as:  MIRALAX Take 17 g by mouth 2 (two) times daily.   polyvinyl alcohol-povidone 1.4-0.6 % ophthalmic solution Commonly known as:  HYPOTEARS Place 1 drop into both eyes as needed (dry eyes).   potassium chloride  SA 20 MEQ tablet Commonly known as:  K-DUR,KLOR-CON Take 20 mEq by mouth 2 (two) times daily.   predniSONE 20 MG tablet Commonly known as:  DELTASONE Take 1 tablet (20 mg total) by mouth daily with breakfast.   senna 8.6 MG Tabs tablet Commonly known as:  SENOKOT Take 1 tablet (8.6 mg total) by mouth daily.   sertraline 50 MG tablet Commonly known as:  ZOLOFT Take 50 mg by mouth daily.   tiotropium 18 MCG inhalation capsule Commonly known as:  SPIRIVA Place 18 mcg into inhaler and inhale daily.   traZODone 50 MG tablet Commonly known as:  DESYREL Take 50 mg by mouth at bedtime as needed for sleep.   trimethoprim 100 MG tablet Commonly known  as:  TRIMPEX Take 100 mg by mouth daily.       Review of Systems  Constitutional: Negative for activity change, appetite change, chills, diaphoresis and fever.  HENT: Negative for congestion, sneezing, sore throat, trouble swallowing and voice change.   Eyes: Negative.   Respiratory: Negative for apnea, cough, choking, chest tightness, shortness of breath and wheezing.   Cardiovascular: Negative for chest pain, palpitations and leg swelling.  Gastrointestinal: Negative for abdominal distention, abdominal pain, constipation, diarrhea, nausea and vomiting.  Endocrine: Negative.   Genitourinary: Negative for difficulty urinating, dysuria, frequency and urgency.  Musculoskeletal: Positive for arthralgias (typical arthritis), back pain, gait problem and myalgias.  Skin: Negative for color change, pallor, rash and wound.  Neurological: Negative for dizziness, tremors, syncope, speech difficulty, weakness, numbness and headaches.  Psychiatric/Behavioral: Negative.   All other systems reviewed and are negative.   Immunization History  Administered Date(s) Administered  . Influenza-Unspecified 02/02/2014   Pertinent  Health Maintenance Due  Topic Date Due  . DEXA SCAN  07/01/1992  . PNA vac Low Risk Adult (1 of 2 - PCV13) 07/01/1992    . INFLUENZA VACCINE  12/04/2015   No flowsheet data found. Functional Status Survey:    Vitals:   04/07/16 1345  BP: (!) 142/49  Pulse: 77  Resp: 18  Temp: 98.4 F (36.9 C)  SpO2: 96%   There is no height or weight on file to calculate BMI. Physical Exam  Constitutional: She is oriented to person, place, and time. Vital signs are normal. She appears well-developed and well-nourished. She is active and cooperative. She does not appear ill. No distress.  HENT:  Head: Normocephalic and atraumatic.  Mouth/Throat: Uvula is midline, oropharynx is clear and moist and mucous membranes are normal. Mucous membranes are not pale, not dry and not cyanotic.  Eyes: Conjunctivae, EOM and lids are normal. Pupils are equal, round, and reactive to light.  Neck: Trachea normal, normal range of motion and full passive range of motion without pain. Neck supple. No JVD present. No tracheal deviation, no edema and no erythema present. No thyromegaly present.  Cardiovascular: Normal rate, regular rhythm, normal heart sounds, intact distal pulses and normal pulses.  Exam reveals no gallop, no distant heart sounds and no friction rub.   No murmur heard. Pulmonary/Chest: Effort normal and breath sounds normal. No accessory muscle usage. No respiratory distress. She has no decreased breath sounds. She has no wheezes. She has no rhonchi. She has no rales. She exhibits no tenderness.  Abdominal: Soft. Normal appearance. She exhibits distension. She exhibits no ascites and no mass. Bowel sounds are decreased. There is no tenderness.  Musculoskeletal: She exhibits no edema.       Lumbar back: She exhibits decreased range of motion, bony tenderness, pain and spasm.  Expected osteoarthritis, stiffness  Neurological: She is alert and oriented to person, place, and time. She has normal strength.  Skin: Skin is warm, dry and intact. She is not diaphoretic. No cyanosis. No pallor. Nails show no clubbing.  Psychiatric:  She has a normal mood and affect. Her speech is normal and behavior is normal. Judgment and thought content normal. Cognition and memory are normal.  Nursing note and vitals reviewed.   Labs reviewed:  Recent Labs  02/28/16 0343 02/29/16 0427 03/01/16 0450  NA 140 140 139  K 3.5 4.1 4.3  CL 105 104 103  CO2 27 30 31   GLUCOSE 112* 114* 112*  BUN 27* 28* 27*  CREATININE 1.01* 1.19*  1.03*  CALCIUM 8.3* 8.1* 8.1*    Recent Labs  02/25/16 0325 02/25/16 1243  AST 20 19  ALT 34 32  ALKPHOS 131* 110  BILITOT 0.8 1.1  PROT 7.0 6.1*  ALBUMIN 4.1 3.6    Recent Labs  01/26/16 1208  02/25/16 1243  02/29/16 0427  02/29/16 2229 03/01/16 0450 03/06/16 0540  WBC 14.0*  < > 11.4*  < > 8.9  --   --  8.0 6.9  NEUTROABS 12.5*  --  9.6*  --   --   --   --   --  5.6  HGB 11.2*  < > 11.1*  < > 8.8*  < > 8.7* 8.3* 8.8*  HCT 32.9*  < > 33.2*  < > 26.5*  < > 26.4* 25.1* 26.0*  MCV 84.6  < > 87.1  < > 87.5  --   --  86.4 86.7  PLT 259  < > 177  < > 153  --   --  162 244  < > = values in this interval not displayed. Lab Results  Component Value Date   TSH 1.050 02/25/2016   Lab Results  Component Value Date   HGBA1C 5.9 (H) 02/25/2016   Lab Results  Component Value Date   CHOL 127 02/09/2015   HDL 72 02/09/2015   LDLCALC 49 02/09/2015   TRIG 28 02/09/2015   CHOLHDL 1.8 02/09/2015    Significant Diagnostic Results in last 30 days:  No results found.  Assessment/Plan 1. Closed compression fracture of first lumbar vertebra, subsequent encounter (HCC)  Lidoderm 5% patch to area of pain daily, remove after 12 hours  Tylenol 650 mg po QID  Methocarbamol 500 mg po Q 6 hours prn  Oxycodone 5 mg 1 tablet po Q 4 hours prn pain- #120, no refill  MRI of the lumbar spine without contrast Scheduled for 12/14 to evaluate compression fracture  Likely refer to neurosurgeon/ orthopedist to evaluate for appropriateness of a kyphoplasty, etc.  No restrictions for PT- do as pt  tolerates  Increase sertraline to 75 mg daily  2. Ileus ([place])  Resolved  Monitor for regular bowel movements   Family/ staff Communication:   Total Time:  Documentation:  Face to Face:  Family/Phone:   Labs/tests ordered:   MRI without contrast of the lumbar spine  Medication list reviewed and assessed for continued appropriateness.  Vikki Ports, NP-C [facility] [phone], [place] [address] Cell Phone (Mon-Fri 8am-5pm):  [phone] On Call:  [phone] & follow prompts after 5pm & weekends Office Phone:  [phone] Office Fax:  [phone]

## 2016-04-16 DIAGNOSIS — S72142D Displaced intertrochanteric fracture of left femur, subsequent encounter for closed fracture with routine healing: Secondary | ICD-10-CM | POA: Diagnosis not present

## 2016-04-16 DIAGNOSIS — I10 Essential (primary) hypertension: Secondary | ICD-10-CM | POA: Diagnosis not present

## 2016-04-16 DIAGNOSIS — I4891 Unspecified atrial fibrillation: Secondary | ICD-10-CM | POA: Diagnosis not present

## 2016-04-16 DIAGNOSIS — F329 Major depressive disorder, single episode, unspecified: Secondary | ICD-10-CM | POA: Diagnosis not present

## 2016-04-16 DIAGNOSIS — J449 Chronic obstructive pulmonary disease, unspecified: Secondary | ICD-10-CM | POA: Diagnosis not present

## 2016-04-16 DIAGNOSIS — D649 Anemia, unspecified: Secondary | ICD-10-CM | POA: Diagnosis not present

## 2016-04-17 ENCOUNTER — Ambulatory Visit
Admission: RE | Admit: 2016-04-17 | Discharge: 2016-04-17 | Disposition: A | Discharge status: Home / Self Care | Payer: Medicare Other | Source: Ambulatory Visit | Attending: Gerontology | Admitting: Gerontology

## 2016-04-17 DIAGNOSIS — Z9181 History of falling: Secondary | ICD-10-CM | POA: Diagnosis not present

## 2016-04-17 DIAGNOSIS — M48061 Spinal stenosis, lumbar region without neurogenic claudication: Secondary | ICD-10-CM | POA: Insufficient documentation

## 2016-04-17 DIAGNOSIS — M545 Low back pain: Secondary | ICD-10-CM | POA: Diagnosis not present

## 2016-04-17 DIAGNOSIS — R600 Localized edema: Secondary | ICD-10-CM | POA: Diagnosis not present

## 2016-04-17 DIAGNOSIS — R262 Difficulty in walking, not elsewhere classified: Secondary | ICD-10-CM

## 2016-04-17 DIAGNOSIS — M8008XA Age-related osteoporosis with current pathological fracture, vertebra(e), initial encounter for fracture: Secondary | ICD-10-CM | POA: Diagnosis not present

## 2016-04-17 DIAGNOSIS — M80052D Age-related osteoporosis with current pathological fracture, left femur, subsequent encounter for fracture with routine healing: Secondary | ICD-10-CM | POA: Insufficient documentation

## 2016-05-05 ENCOUNTER — Encounter
Admission: RE | Admit: 2016-05-05 | Discharge: 2016-05-05 | Disposition: A | Discharge status: Home / Self Care | Payer: Medicare Other | Source: Ambulatory Visit | Attending: Internal Medicine | Admitting: Internal Medicine

## 2016-05-05 DIAGNOSIS — R05 Cough: Secondary | ICD-10-CM | POA: Insufficient documentation

## 2016-05-05 DIAGNOSIS — R300 Dysuria: Secondary | ICD-10-CM | POA: Insufficient documentation

## 2016-05-05 DIAGNOSIS — R5383 Other fatigue: Secondary | ICD-10-CM | POA: Insufficient documentation

## 2016-05-05 DIAGNOSIS — R42 Dizziness and giddiness: Secondary | ICD-10-CM | POA: Insufficient documentation

## 2016-05-05 DIAGNOSIS — Z79899 Other long term (current) drug therapy: Secondary | ICD-10-CM | POA: Insufficient documentation

## 2016-05-05 DIAGNOSIS — R531 Weakness: Secondary | ICD-10-CM | POA: Insufficient documentation

## 2016-05-05 DIAGNOSIS — D649 Anemia, unspecified: Secondary | ICD-10-CM | POA: Insufficient documentation

## 2016-05-15 ENCOUNTER — Other Ambulatory Visit: Payer: Self-pay

## 2016-05-26 DIAGNOSIS — R05 Cough: Secondary | ICD-10-CM | POA: Diagnosis not present

## 2016-05-26 DIAGNOSIS — Z79899 Other long term (current) drug therapy: Secondary | ICD-10-CM | POA: Diagnosis not present

## 2016-05-26 DIAGNOSIS — R5383 Other fatigue: Secondary | ICD-10-CM | POA: Diagnosis not present

## 2016-05-26 DIAGNOSIS — R300 Dysuria: Secondary | ICD-10-CM | POA: Diagnosis not present

## 2016-05-26 DIAGNOSIS — R42 Dizziness and giddiness: Secondary | ICD-10-CM | POA: Diagnosis not present

## 2016-05-26 DIAGNOSIS — R531 Weakness: Secondary | ICD-10-CM | POA: Diagnosis not present

## 2016-05-26 LAB — URINALYSIS, ROUTINE W REFLEX MICROSCOPIC
BACTERIA UA: NONE SEEN
Bilirubin Urine: NEGATIVE
Glucose, UA: NEGATIVE mg/dL
HGB URINE DIPSTICK: NEGATIVE
Ketones, ur: NEGATIVE mg/dL
NITRITE: NEGATIVE
Protein, ur: NEGATIVE mg/dL
SPECIFIC GRAVITY, URINE: 1.014 (ref 1.005–1.030)
pH: 5 (ref 5.0–8.0)

## 2016-06-02 ENCOUNTER — Other Ambulatory Visit: Payer: Self-pay

## 2016-06-02 ENCOUNTER — Ambulatory Visit (INDEPENDENT_AMBULATORY_CARE_PROVIDER_SITE_OTHER): Payer: Medicare Other

## 2016-06-02 DIAGNOSIS — R011 Cardiac murmur, unspecified: Secondary | ICD-10-CM | POA: Diagnosis not present

## 2016-06-03 ENCOUNTER — Telehealth: Payer: Self-pay | Admitting: Cardiology

## 2016-06-03 DIAGNOSIS — M4856XA Collapsed vertebra, not elsewhere classified, lumbar region, initial encounter for fracture: Secondary | ICD-10-CM | POA: Diagnosis not present

## 2016-06-03 DIAGNOSIS — S72142D Displaced intertrochanteric fracture of left femur, subsequent encounter for closed fracture with routine healing: Secondary | ICD-10-CM | POA: Diagnosis not present

## 2016-06-03 NOTE — Telephone Encounter (Signed)
Patient daughter returning call from pam .  Please call again.

## 2016-06-04 ENCOUNTER — Non-Acute Institutional Stay (SKILLED_NURSING_FACILITY): Payer: Medicare Other | Admitting: Gerontology

## 2016-06-04 DIAGNOSIS — R5383 Other fatigue: Secondary | ICD-10-CM | POA: Diagnosis not present

## 2016-06-04 DIAGNOSIS — J101 Influenza due to other identified influenza virus with other respiratory manifestations: Secondary | ICD-10-CM

## 2016-06-04 DIAGNOSIS — R42 Dizziness and giddiness: Secondary | ICD-10-CM | POA: Diagnosis not present

## 2016-06-04 DIAGNOSIS — R531 Weakness: Secondary | ICD-10-CM | POA: Diagnosis not present

## 2016-06-04 DIAGNOSIS — R300 Dysuria: Secondary | ICD-10-CM | POA: Diagnosis not present

## 2016-06-04 DIAGNOSIS — Z79899 Other long term (current) drug therapy: Secondary | ICD-10-CM | POA: Diagnosis not present

## 2016-06-04 DIAGNOSIS — R05 Cough: Secondary | ICD-10-CM | POA: Diagnosis not present

## 2016-06-04 LAB — INFLUENZA PANEL BY PCR (TYPE A & B)
Influenza A By PCR: POSITIVE — AB
Influenza B By PCR: NEGATIVE

## 2016-06-04 LAB — CBC WITH DIFFERENTIAL/PLATELET
Basophils Absolute: 0.1 10*3/uL (ref 0–0.1)
Basophils Relative: 1 %
EOS PCT: 0 %
Eosinophils Absolute: 0 10*3/uL (ref 0–0.7)
HCT: 30.2 % — ABNORMAL LOW (ref 35.0–47.0)
Hemoglobin: 10.1 g/dL — ABNORMAL LOW (ref 12.0–16.0)
LYMPHS ABS: 0.9 10*3/uL — AB (ref 1.0–3.6)
LYMPHS PCT: 10 %
MCH: 29.7 pg (ref 26.0–34.0)
MCHC: 33.5 g/dL (ref 32.0–36.0)
MCV: 88.6 fL (ref 80.0–100.0)
MONO ABS: 0.4 10*3/uL (ref 0.2–0.9)
Monocytes Relative: 4 %
Neutro Abs: 7.3 10*3/uL — ABNORMAL HIGH (ref 1.4–6.5)
Neutrophils Relative %: 85 %
Platelets: 169 10*3/uL (ref 150–440)
RBC: 3.41 MIL/uL — ABNORMAL LOW (ref 3.80–5.20)
RDW: 17.4 % — AB (ref 11.5–14.5)
WBC: 8.6 10*3/uL (ref 3.6–11.0)

## 2016-06-04 LAB — COMPREHENSIVE METABOLIC PANEL
ALT: 14 U/L (ref 14–54)
AST: 17 U/L (ref 15–41)
Albumin: 3.2 g/dL — ABNORMAL LOW (ref 3.5–5.0)
Alkaline Phosphatase: 94 U/L (ref 38–126)
Anion gap: 6 (ref 5–15)
BUN: 19 mg/dL (ref 6–20)
CHLORIDE: 105 mmol/L (ref 101–111)
CO2: 26 mmol/L (ref 22–32)
CREATININE: 0.96 mg/dL (ref 0.44–1.00)
Calcium: 8.2 mg/dL — ABNORMAL LOW (ref 8.9–10.3)
GFR, EST AFRICAN AMERICAN: 59 mL/min — AB (ref >60)
GFR, EST NON AFRICAN AMERICAN: 51 mL/min — AB (ref >60)
Glucose, Bld: 107 mg/dL — ABNORMAL HIGH (ref 65–99)
Potassium: 4.9 mmol/L (ref 3.5–5.1)
Sodium: 137 mmol/L (ref 135–145)
TOTAL PROTEIN: 5.1 g/dL — AB (ref 6.5–8.1)
Total Bilirubin: 0.5 mg/dL (ref 0.3–1.2)

## 2016-06-04 LAB — IRON AND TIBC
IRON: 18 ug/dL — AB (ref 28–170)
Saturation Ratios: 8 % — ABNORMAL LOW (ref 10.4–31.8)
TIBC: 220 ug/dL — AB (ref 250–450)
UIBC: 202 ug/dL

## 2016-06-04 LAB — FERRITIN: FERRITIN: 77 ng/mL (ref 11–307)

## 2016-06-04 NOTE — Telephone Encounter (Signed)
See results note. 

## 2016-06-04 NOTE — Telephone Encounter (Signed)
Left voicemail message for her to call back.  

## 2016-06-04 NOTE — Progress Notes (Signed)
Location:      Place of Service:  SNF (31) Provider:  Toni Arthurs, NP-C  DAVID Lovie Macadamia, MD  Patient Care Team: Juluis Pitch, MD as PCP - General (Family Medicine)  Extended Emergency Contact Information Primary Emergency Contact: [name] Address: [address]          [place], [place] [address] [name] of [place] Phone: [phone] Relation: Daughter Secondary Emergency Contact: Lingerfelt,Brad Address: [place], [place] [address] [name] of [place] Phone: [phone] Relation: [name]  Code Status:  full Goals of care: Advanced Directive information Advanced Directives 02/25/2016  Does Patient Have a Medical Advance Directive? No  Type of Advance Directive -  Would patient like information on creating a medical advance directive? No - patient declined information     Chief Complaint  Patient presents with  . Acute Visit    HPI:  Pt is a 83 y.o. female seen today for an acute visit for cough and congestion. Pt has been having sx for approx 3 days. There are multiple cases of Influenza in the building right now. None, however on the unit she resides. Pt c/o non-productive cough. Audible congestion clears with cough. No fever, no chills. ?night sweats. C/O sore throat. Res reports feeling like mucus is sitting in the back of her throat. Denies chest pain. Baseline backpain in lower lumbar area (old compression fractures) generalized weakness, nocturia r/t lasix. No change in appetite. Also, c/o pain with cough in RL ribs. Gneralized fatigue with movement and dyspnea on exertion. Rates pain/discomfort 5/10. 3+ pitting edema in BLE. Pt typically sits in chair with feet dependent. No n/v/d. VSS. No other complaints.    Past Medical History:  Diagnosis Date  . Adenoma of rectum   . Adrenal adenoma   . Anemia   . Ascending aortic aneurysm ([place])    a. s/p repair 07/2014  . Atrophic vaginitis   . COPD (chronic  obstructive pulmonary disease) ([place])   . Coronary artery disease, non-occlusive    a. LHC 06/2014 without evidence of obstructive disease  . Diverticulosis   . Dysphagia   . Frequent UTI   . Hemiplegia and hemiparesis ([place])    following cerebral infarction affecting left non-dominant side  . Microscopic hematuria   . Nonrheumatic aortic valve insufficiency   . Osteoporosis   . Paroxysmal atrial fibrillation ([facility])   . Renal cyst   . Stroke [facility])    a. post-operative setting in 07/2014 leading to left UE hemiapresis and left LE weakness  . Urge incontinence    Past Surgical History:  Procedure Laterality Date  . ABDOMINAL HYSTERECTOMY    . AORTOILIAC BYPASS    . INTRAMEDULLARY (IM) NAIL INTERTROCHANTERIC Left 02/26/2016   Procedure: INTRAMEDULLARY (IM) NAIL INTERTROCHANTRIC;  Surgeon: Earnestine Leys, MD;  Location: [facility] ORS;  Service: Orthopedics;  Laterality: Left;    Allergies  Allergen Reactions  . Iodinated Diagnostic Agents Itching and Swelling  . Ciprofloxacin Other (See Comments)    Colitis  . Nitrofurantoin Diarrhea  . Solifenacin Other (See Comments)    indigestion  . Metronidazole Itching and Rash    Allergies as of 06/04/2016      Reactions   Iodinated Diagnostic Agents Itching, Swelling   Ciprofloxacin Other (See Comments)   Colitis   Nitrofurantoin Diarrhea   Solifenacin Other (See Comments)   indigestion   Metronidazole Itching, Rash      Medication List  Accurate as of 06/04/16 12:05 PM. Always use your most recent med list.          acetaminophen 325 MG tablet Commonly known as:  TYLENOL Take 650 mg by mouth every 4 (four) hours as needed for mild pain or fever.   albuterol 108 (90 Base) MCG/ACT inhaler Commonly known as:  PROVENTIL HFA;VENTOLIN HFA Inhale 2 puffs into the lungs every 4 (four) hours as needed for wheezing or shortness of breath.   artificial tears ointment Place 1 application into the left eye 4 (four) times daily.     atorvastatin 20 MG tablet Commonly known as:  LIPITOR Take 20 mg by mouth at bedtime.   benzonatate 100 MG capsule Commonly known as:  TESSALON Take 100 mg by mouth every 8 (eight) hours as needed for cough.   bisacodyl 10 MG suppository Commonly known as:  DULCOLAX Place 10 mg rectally daily as needed for moderate constipation.   clonazePAM 0.5 MG tablet Commonly known as:  KLONOPIN Take 0.25 mg by mouth daily.   cyanocobalamin 1000 MCG tablet Take 1,000 mcg by mouth daily.   diclofenac sodium 1 % Gel Commonly known as:  VOLTAREN Apply 4 g topically 4 (four) times daily as needed (pain).   diltiazem 60 MG tablet Commonly known as:  CARDIZEM Take 60 mg by mouth daily.   docusate sodium 100 MG capsule Commonly known as:  COLACE Take 1 capsule (100 mg total) by mouth 2 (two) times daily.   ferrous sulfate 325 (65 FE) MG tablet Take 1 tablet (325 mg total) by mouth 2 (two) times daily with a meal.   fluticasone 110 MCG/ACT inhaler Commonly known as:  FLOVENT HFA Inhale into the lungs.   fluticasone-salmeterol 230-21 MCG/ACT inhaler Commonly known as:  ADVAIR HFA Inhale 2 puffs into the lungs 2 (two) times daily.   furosemide 40 MG tablet Commonly known as:  LASIX Take 60 mg by mouth 2 (two) times daily.   heparin 5000 UNIT/ML injection Inject 1 mL (5,000 Units total) into the skin every 8 (eight) hours.   ipratropium 0.03 % nasal spray Commonly known as:  ATROVENT Place 2 sprays into both nostrils every 8 (eight) hours as needed for rhinitis.   magnesium hydroxide 400 MG/5ML suspension Commonly known as:  MILK OF MAGNESIA Take 30 mLs by mouth daily as needed for mild constipation.   Melatonin 3 MG Tabs Take 6 mg by mouth at bedtime.   methocarbamol 500 MG tablet Commonly known as:  ROBAXIN Take 500 mg by mouth every 6 (six) hours as needed for muscle spasms.   omeprazole 40 MG capsule Commonly known as:  PRILOSEC Take 40 mg by mouth daily.    oxyCODONE 5 MG immediate release tablet Commonly known as:  ROXICODONE Take 1 tablet (5 mg total) by mouth every 8 (eight) hours as needed for moderate pain or severe pain.   polyethylene glycol packet Commonly known as:  MIRALAX Take 17 g by mouth 2 (two) times daily.   polyvinyl alcohol-povidone 1.4-0.6 % ophthalmic solution Commonly known as:  HYPOTEARS Place 1 drop into both eyes as needed (dry eyes).   potassium chloride SA 20 MEQ tablet Commonly known as:  K-DUR,KLOR-CON Take 20 mEq by mouth 2 (two) times daily.   predniSONE 20 MG tablet Commonly known as:  DELTASONE Take 1 tablet (20 mg total) by mouth daily with breakfast.   senna 8.6 MG Tabs tablet Commonly known as:  SENOKOT Take 1 tablet (8.6 mg total) by  mouth daily.   sertraline 50 MG tablet Commonly known as:  ZOLOFT Take 50 mg by mouth daily.   tiotropium 18 MCG inhalation capsule Commonly known as:  SPIRIVA Place 18 mcg into inhaler and inhale daily.   traZODone 50 MG tablet Commonly known as:  DESYREL Take 50 mg by mouth at bedtime as needed for sleep.   trimethoprim 100 MG tablet Commonly known as:  TRIMPEX Take 100 mg by mouth daily.       Review of Systems  Constitutional: Positive for fatigue and unexpected weight change (weight gain). Negative for activity change, appetite change, chills, diaphoresis and fever.  HENT: Positive for congestion, postnasal drip and sore throat. Negative for ear pain, mouth sores, nosebleeds, rhinorrhea, sinus pain, sinus pressure, sneezing, trouble swallowing and voice change.   Eyes: Negative.   Respiratory: Positive for cough and shortness of breath. Negative for apnea, choking, chest tightness and wheezing.   Cardiovascular: Negative for chest pain, palpitations and leg swelling.  Gastrointestinal: Negative for abdominal distention, abdominal pain, constipation, diarrhea and nausea.  Genitourinary: Positive for frequency (chronic nocturia). Negative for  difficulty urinating, dysuria and urgency.  Musculoskeletal: Positive for arthralgias (typical arthritis) and back pain (chronic). Negative for gait problem and myalgias.  Skin: Negative for color change, pallor, rash and wound.  Neurological: Negative for dizziness, tremors, syncope, speech difficulty, weakness, numbness and headaches.  Psychiatric/Behavioral: Negative for agitation and behavioral problems.  All other systems reviewed and are negative.   Immunization History  Administered Date(s) Administered  . Influenza-Unspecified 02/02/2014   Pertinent  Health Maintenance Due  Topic Date Due  . DEXA SCAN  07/01/1992  . PNA vac Low Risk Adult (1 of 2 - PCV13) 07/01/1992  . INFLUENZA VACCINE  12/04/2015   No flowsheet data found. Functional Status Survey:    Vitals:   06/02/16 2300 06/04/16 0600  BP: (!) 115/49   Pulse: 65   Resp: 18   Temp: 98.1 F (36.7 C)   SpO2: 100%   Weight: 115 lb 1.6 oz (52.2 kg) 122 lb 9.6 oz (55.6 kg)   Body mass index is 23.17 kg/m. Physical Exam  Constitutional: She is oriented to person, place, and time. Vital signs are normal. She appears well-developed and well-nourished. She is active and cooperative. She does not appear ill. No distress.  HENT:  Head: Normocephalic and atraumatic.  Mouth/Throat: Uvula is midline, oropharynx is clear and moist and mucous membranes are normal. Mucous membranes are not pale, not dry and not cyanotic.  Eyes: Conjunctivae, EOM and lids are normal. Pupils are equal, round, and reactive to light.  Neck: Trachea normal, normal range of motion and full passive range of motion without pain. Neck supple. No JVD present. No tracheal deviation, no edema and no erythema present. No thyromegaly present.  Cardiovascular: Normal rate, regular rhythm, normal heart sounds and intact distal pulses.  Exam reveals no gallop, no distant heart sounds and no friction rub.   No murmur heard. Pulses:      Dorsalis pedis pulses  are 1+ on the right side, and 1+ on the left side.  3+ B-pitting edema. TED Hose in place  Pulmonary/Chest: Effort normal. No accessory muscle usage. No respiratory distress. She has no decreased breath sounds. She has wheezes (anterior. Clear posterior after cough) in the right upper field, the right middle field and the right lower field. She has rhonchi (anterior. Clear posterior after cough) in the right upper field, the right middle field and the right lower  field. She has no rales. She exhibits no tenderness.  Abdominal: Soft. Normal appearance and bowel sounds are normal. She exhibits no distension and no ascites. There is no tenderness.  Musculoskeletal: Normal range of motion. She exhibits no edema or tenderness.  Expected osteoarthritis, stiffness  Neurological: She is alert and oriented to person, place, and time. She has normal strength.  Skin: Skin is warm, dry and intact. She is not diaphoretic. No cyanosis. No pallor. Nails show no clubbing.  Psychiatric: She has a normal mood and affect. Her speech is normal and behavior is normal. Judgment and thought content normal. Cognition and memory are normal.  Nursing note and vitals reviewed.   Labs reviewed:  Recent Labs  02/28/16 0343 02/29/16 0427 03/01/16 0450  NA 140 140 139  K 3.5 4.1 4.3  CL 105 104 103  CO2 '27 30 31  '$ GLUCOSE 112* 114* 112*  BUN 27* 28* 27*  CREATININE 1.01* 1.19* 1.03*  CALCIUM 8.3* 8.1* 8.1*    Recent Labs  02/25/16 0325 02/25/16 1243  AST 20 19  ALT 34 32  ALKPHOS 131* 110  BILITOT 0.8 1.1  PROT 7.0 6.1*  ALBUMIN 4.1 3.6    Recent Labs  01/26/16 1208  02/25/16 1243  02/29/16 0427  02/29/16 2229 03/01/16 0450 03/06/16 0540  WBC 14.0*  < > 11.4*  < > 8.9  --   --  8.0 6.9  NEUTROABS 12.5*  --  9.6*  --   --   --   --   --  5.6  HGB 11.2*  < > 11.1*  < > 8.8*  < > 8.7* 8.3* 8.8*  HCT 32.9*  < > 33.2*  < > 26.5*  < > 26.4* 25.1* 26.0*  MCV 84.6  < > 87.1  < > 87.5  --   --  86.4 86.7   PLT 259  < > 177  < > 153  --   --  162 244  < > = values in this interval not displayed. Lab Results  Component Value Date   TSH 1.050 02/25/2016   Lab Results  Component Value Date   HGBA1C 5.9 (H) 02/25/2016   Lab Results  Component Value Date   CHOL 127 02/09/2015   HDL 72 02/09/2015   LDLCALC 49 02/09/2015   TRIG 28 02/09/2015   CHOLHDL 1.8 02/09/2015    Significant Diagnostic Results in last 30 days:  No results found.  Assessment/Plan 1. Influenza A  Check labs  CXR  Flu swab- r/o influenza  Continue prn duonebs  Tamiflu 75 mg po BID x 5 days  Droplet Precautions  Encourage po fluid intake  Family/ staff Communication:   Total Time:  Documentation:  Face to Face:  Family/Phone:   Labs/tests ordered:  Cbc, met c, iron/ anemia panel, flu swab, CXR  Medication list reviewed and assessed for continued appropriateness.  Vikki Ports, NP-C Geriatrics [facility] [phone], [place] [address] Cell Phone (Mon-Fri 8am-5pm):  [phone] On Call:  [phone] & follow prompts after 5pm & weekends Office Phone:  [phone] Office Fax:  [phone]

## 2016-06-05 ENCOUNTER — Encounter
Admission: RE | Admit: 2016-06-05 | Discharge: 2016-06-05 | Disposition: A | Discharge status: Home / Self Care | Payer: Medicare Other | Source: Ambulatory Visit | Attending: Internal Medicine | Admitting: Internal Medicine

## 2016-06-05 DIAGNOSIS — R300 Dysuria: Secondary | ICD-10-CM | POA: Insufficient documentation

## 2016-06-05 DIAGNOSIS — R531 Weakness: Secondary | ICD-10-CM | POA: Insufficient documentation

## 2016-06-06 ENCOUNTER — Non-Acute Institutional Stay (SKILLED_NURSING_FACILITY): Payer: Medicare Other | Admitting: Gerontology

## 2016-06-06 DIAGNOSIS — J101 Influenza due to other identified influenza virus with other respiratory manifestations: Secondary | ICD-10-CM

## 2016-06-06 NOTE — Progress Notes (Signed)
Location:      Place of Service:  SNF (31) Provider:  Toni Arthurs, NP-C  DAVID Lovie Macadamia, MD  Patient Care Team: Juluis Pitch, MD as PCP - General (Family Medicine)  Extended Emergency Contact Information Primary Emergency Contact: [name] Address: [address]          [place], [place] [address] [name] of [place] Phone: [phone] Relation: Daughter Secondary Emergency Contact: Lingerfelt,Brad Address: [place], [place] [address] [name] of [place] Phone: [phone] Relation: [name]  Code Status:  full Goals of care: Advanced Directive information Advanced Directives 02/25/2016  Does Patient Have a Medical Advance Directive? No  Type of Advance Directive -  Would patient like information on creating a medical advance directive? No - patient declined information     Chief Complaint  Patient presents with  . Follow-up    HPI:  Pt is a 83 y.o. female seen today for a follow up visit for cough and congestion, + Influenza A. Pt had been having sx for approx 3 days. There are multiple cases of Influenza in the building right now. None, however on the unit she resides, until she was found to be positive. At the time, Pt c/o non-productive cough. Audible congestion clears with cough. No fever, no chills. ?night sweats. C/O sore throat. Res reports feeling like mucus is sitting in the back of her throat. Denies chest pain. Baseline backpain in lower lumbar area (old compression fractures) generalized weakness, nocturia r/t lasix. No change in appetite. Also, c/o pain with cough in RL ribs. Generalized fatigue with movement and dyspnea on exertion. Rates pain/discomfort 5/10. 3+ pitting edema in BLE. Pt typically sits in chair with feet dependent. TODAY, pt reports she is feeling well- back at baseline. She denies n/v/d/f/c/cp/sob/ha/abd pain/ dizziness. Pt is asking when she can be taken off droplet precautions as she is  bored in her room. VSS. No other complaints.    Past Medical History:  Diagnosis Date  . Adenoma of rectum   . Adrenal adenoma   . Anemia   . Ascending aortic aneurysm ([facility] [facility])    a. s/p repair 07/2014  . Atrophic vaginitis   . COPD (chronic obstructive pulmonary disease) ([facility])   . Coronary artery disease, non-occlusive    a. LHC 06/2014 without evidence of obstructive disease  . Diverticulosis   . Dysphagia   . Frequent UTI   . Hemiplegia and hemiparesis ([place])    following cerebral infarction affecting left non-dominant side  . Microscopic hematuria   . Nonrheumatic aortic valve insufficiency   . Osteoporosis   . Paroxysmal atrial fibrillation ([facility])   . Renal cyst   . Stroke [facility]    a. post-operative setting in 07/2014 leading to left UE hemiapresis and left LE weakness  . Urge incontinence    Past Surgical History:  Procedure Laterality Date  . ABDOMINAL HYSTERECTOMY    . AORTOILIAC BYPASS    . INTRAMEDULLARY (IM) NAIL INTERTROCHANTERIC Left 02/26/2016   Procedure: INTRAMEDULLARY (IM) NAIL INTERTROCHANTRIC;  Surgeon: Earnestine Leys, MD;  Location: [facility] ORS;  Service: Orthopedics;  Laterality: Left;    Allergies  Allergen Reactions  . Iodinated Diagnostic Agents Itching and Swelling  . Ciprofloxacin Other (See Comments)    Colitis  . Nitrofurantoin Diarrhea  . Solifenacin Other (See Comments)    indigestion  . Metronidazole Itching and Rash    Allergies as of 06/06/2016  Reactions   Iodinated Diagnostic Agents Itching, Swelling   Ciprofloxacin Other (See Comments)   Colitis   Nitrofurantoin Diarrhea   Solifenacin Other (See Comments)   indigestion   Metronidazole Itching, Rash      Medication List       Accurate as of 06/06/16  9:50 PM. Always use your most recent med list.          acetaminophen 325 MG tablet Commonly known as:  TYLENOL Take 650 mg by mouth every 4 (four) hours as needed for mild pain or fever.   albuterol 108 (90 Base) MCG/ACT  inhaler Commonly known as:  PROVENTIL HFA;VENTOLIN HFA Inhale 2 puffs into the lungs every 4 (four) hours as needed for wheezing or shortness of breath.   artificial tears ointment Place 1 application into the left eye 4 (four) times daily.   atorvastatin 20 MG tablet Commonly known as:  LIPITOR Take 20 mg by mouth at bedtime.   benzonatate 100 MG capsule Commonly known as:  TESSALON Take 100 mg by mouth every 8 (eight) hours as needed for cough.   bisacodyl 10 MG suppository Commonly known as:  DULCOLAX Place 10 mg rectally daily as needed for moderate constipation.   clonazePAM 0.5 MG tablet Commonly known as:  KLONOPIN Take 0.25 mg by mouth daily.   cyanocobalamin 1000 MCG tablet Take 1,000 mcg by mouth daily.   diclofenac sodium 1 % Gel Commonly known as:  VOLTAREN Apply 4 g topically 4 (four) times daily as needed (pain).   diltiazem 60 MG tablet Commonly known as:  CARDIZEM Take 60 mg by mouth daily.   docusate sodium 100 MG capsule Commonly known as:  COLACE Take 1 capsule (100 mg total) by mouth 2 (two) times daily.   ferrous sulfate 325 (65 FE) MG tablet Take 1 tablet (325 mg total) by mouth 2 (two) times daily with a meal.   fluticasone 110 MCG/ACT inhaler Commonly known as:  FLOVENT HFA Inhale into the lungs.   fluticasone-salmeterol 230-21 MCG/ACT inhaler Commonly known as:  ADVAIR HFA Inhale 2 puffs into the lungs 2 (two) times daily.   furosemide 40 MG tablet Commonly known as:  LASIX Take 60 mg by mouth 2 (two) times daily.   heparin 5000 UNIT/ML injection Inject 1 mL (5,000 Units total) into the skin every 8 (eight) hours.   ipratropium 0.03 % nasal spray Commonly known as:  ATROVENT Place 2 sprays into both nostrils every 8 (eight) hours as needed for rhinitis.   magnesium hydroxide 400 MG/5ML suspension Commonly known as:  MILK OF MAGNESIA Take 30 mLs by mouth daily as needed for mild constipation.   Melatonin 3 MG Tabs Take 6 mg by  mouth at bedtime.   methocarbamol 500 MG tablet Commonly known as:  ROBAXIN Take 500 mg by mouth every 6 (six) hours as needed for muscle spasms.   omeprazole 40 MG capsule Commonly known as:  PRILOSEC Take 40 mg by mouth daily.   oxyCODONE 5 MG immediate release tablet Commonly known as:  ROXICODONE Take 1 tablet (5 mg total) by mouth every 8 (eight) hours as needed for moderate pain or severe pain.   polyethylene glycol packet Commonly known as:  MIRALAX Take 17 g by mouth 2 (two) times daily.   polyvinyl alcohol-povidone 1.4-0.6 % ophthalmic solution Commonly known as:  HYPOTEARS Place 1 drop into both eyes as needed (dry eyes).   potassium chloride SA 20 MEQ tablet Commonly known as:  K-DUR,KLOR-CON Take 20  mEq by mouth 2 (two) times daily.   predniSONE 20 MG tablet Commonly known as:  DELTASONE Take 1 tablet (20 mg total) by mouth daily with breakfast.   senna 8.6 MG Tabs tablet Commonly known as:  SENOKOT Take 1 tablet (8.6 mg total) by mouth daily.   sertraline 50 MG tablet Commonly known as:  ZOLOFT Take 50 mg by mouth daily.   tiotropium 18 MCG inhalation capsule Commonly known as:  SPIRIVA Place 18 mcg into inhaler and inhale daily.   traZODone 50 MG tablet Commonly known as:  DESYREL Take 50 mg by mouth at bedtime as needed for sleep.   trimethoprim 100 MG tablet Commonly known as:  TRIMPEX Take 100 mg by mouth daily.       Review of Systems  Constitutional: Negative for activity change, appetite change, chills, diaphoresis, fatigue, fever and unexpected weight change (weight gain).  HENT: Positive for congestion. Negative for ear pain, mouth sores, nosebleeds, postnasal drip, rhinorrhea, sinus pain, sinus pressure, sneezing, sore throat, trouble swallowing and voice change.   Eyes: Negative.   Respiratory: Negative for apnea, cough, choking, chest tightness, shortness of breath and wheezing.   Cardiovascular: Negative for chest pain, palpitations  and leg swelling.  Gastrointestinal: Negative for abdominal distention, abdominal pain, constipation, diarrhea and nausea.  Genitourinary: Negative for difficulty urinating, dysuria, frequency (chronic nocturia) and urgency.  Musculoskeletal: Positive for arthralgias (typical arthritis) and back pain (chronic). Negative for gait problem and myalgias.  Skin: Negative for color change, pallor, rash and wound.  Neurological: Negative for dizziness, tremors, syncope, speech difficulty, weakness, numbness and headaches.  Psychiatric/Behavioral: Negative for agitation and behavioral problems.  All other systems reviewed and are negative.   Immunization History  Administered Date(s) Administered  . Influenza-Unspecified 02/02/2014   Pertinent  Health Maintenance Due  Topic Date Due  . DEXA SCAN  07/01/1992  . PNA vac Low Risk Adult (1 of 2 - PCV13) 07/01/1992  . INFLUENZA VACCINE  12/04/2015   No flowsheet data found. Functional Status Survey:    There were no vitals filed for this visit. There is no height or weight on file to calculate BMI. Physical Exam  Constitutional: She is oriented to person, place, and time. Vital signs are normal. She appears well-developed and well-nourished. She is active and cooperative. She does not appear ill. No distress.  HENT:  Head: Normocephalic and atraumatic.  Mouth/Throat: Uvula is midline, oropharynx is clear and moist and mucous membranes are normal. Mucous membranes are not pale, not dry and not cyanotic.  Eyes: Conjunctivae, EOM and lids are normal. Pupils are equal, round, and reactive to light.  Neck: Trachea normal, normal range of motion and full passive range of motion without pain. Neck supple. No JVD present. No tracheal deviation, no edema and no erythema present. No thyromegaly present.  Cardiovascular: Normal rate, regular rhythm, normal heart sounds and intact distal pulses.  Exam reveals no gallop, no distant heart sounds and no  friction rub.   No murmur heard. Pulses:      Dorsalis pedis pulses are 1+ on the right side, and 1+ on the left side.  3+ B-pitting edema. TED Hose in place  Pulmonary/Chest: Effort normal. No accessory muscle usage. No respiratory distress. She has no decreased breath sounds. She has wheezes (anterior. Clear posterior after cough) in the right upper field, the right middle field and the right lower field. She has rhonchi (anterior. Clear posterior after cough) in the right upper field, the right middle  field and the right lower field. She has no rales. She exhibits no tenderness.  Abdominal: Soft. Normal appearance and bowel sounds are normal. She exhibits no distension and no ascites. There is no tenderness.  Musculoskeletal: Normal range of motion. She exhibits no edema or tenderness.  Expected osteoarthritis, stiffness  Neurological: She is alert and oriented to person, place, and time. She has normal strength.  Skin: Skin is warm, dry and intact. She is not diaphoretic. No cyanosis. No pallor. Nails show no clubbing.  Psychiatric: She has a normal mood and affect. Her speech is normal and behavior is normal. Judgment and thought content normal. Cognition and memory are normal.  Nursing note and vitals reviewed.   Labs reviewed:  Recent Labs  02/29/16 0427 03/01/16 0450 06/04/16 2100  NA 140 139 137  K 4.1 4.3 4.9  CL 104 103 105  CO2 _0 GLUCOSE 114* 112* 107*  BUN 28* 27* 19  CREATININE 1.19* 1.03* 0.96  CALCIUM 8.1* 8.1* 8.2*    Recent Labs  02/25/16 0325 02/25/16 1243 06/04/16 2100  AST _1 ALT 34 32 14  ALKPHOS 131* 110 94  BILITOT 0.8 1.1 0.5  PROT 7.0 6.1* 5.1*  ALBUMIN 4.1 3.6 3.2*    Recent Labs  02/25/16 1243  03/01/16 0450 03/06/16 0540 06/04/16 2100  WBC 11.4*  < > 8.0 6.9 8.6  NEUTROABS 9.6*  --   --  5.6 7.3*  HGB 11.1*  < > 8.3* 8.8* 10.1*  HCT 33.2*  < > 25.1* 26.0* 30.2*  MCV 87.1  < > 86.4 86.7 88.6  PLT 177  < > 162 244 169    < > = values in this interval not displayed. Lab Results  Component Value Date   TSH 1.050 02/25/2016   Lab Results  Component Value Date   HGBA1C 5.9 (H) 02/25/2016   Lab Results  Component Value Date   CHOL 127 02/09/2015   HDL 72 02/09/2015   LDLCALC 49 02/09/2015   TRIG 28 02/09/2015   CHOLHDL 1.8 02/09/2015    Significant Diagnostic Results in last 30 days:  No results found.  Assessment/Plan 1. Influenza A  Duonebs BID x 2 days  ContinueTamiflu 75 mg po BID x 5 days  Droplet Precautions until tomorrow  Encourage po fluid intake  Family/ staff Communication:   Total Time:  Documentation:  Face to Face:  Family/Phone:   Labs/tests ordered:  Cbc, met c, iron/ anemia panel, flu swab, CXR  Medication list reviewed and assessed for continued appropriateness.  Vikki Ports, NP-C [facility]iatric[facility] [phone],  [id number] Cell Phone (Mon-Fri 8am-5pm):  [phone] On Call:  [phone] & follow prompts after 5pm & weekends Office Phone:  [phone] Office Fax:  [phone]

## 2016-06-10 DIAGNOSIS — R300 Dysuria: Secondary | ICD-10-CM | POA: Diagnosis not present

## 2016-06-10 DIAGNOSIS — R531 Weakness: Secondary | ICD-10-CM | POA: Diagnosis not present

## 2016-06-10 LAB — URINALYSIS, COMPLETE (UACMP) WITH MICROSCOPIC
BILIRUBIN URINE: NEGATIVE
GLUCOSE, UA: NEGATIVE mg/dL
HGB URINE DIPSTICK: NEGATIVE
KETONES UR: NEGATIVE mg/dL
LEUKOCYTES UA: NEGATIVE
Nitrite: NEGATIVE
PROTEIN: NEGATIVE mg/dL
Specific Gravity, Urine: 1.018 (ref 1.005–1.030)
pH: 6 (ref 5.0–8.0)

## 2016-06-11 LAB — URINE CULTURE

## 2016-06-12 DIAGNOSIS — R531 Weakness: Secondary | ICD-10-CM | POA: Diagnosis not present

## 2016-06-12 DIAGNOSIS — R300 Dysuria: Secondary | ICD-10-CM | POA: Diagnosis not present

## 2016-06-12 LAB — URINALYSIS, COMPLETE (UACMP) WITH MICROSCOPIC
Bilirubin Urine: NEGATIVE
GLUCOSE, UA: NEGATIVE mg/dL
Hgb urine dipstick: NEGATIVE
Ketones, ur: NEGATIVE mg/dL
Nitrite: NEGATIVE
PH: 5 (ref 5.0–8.0)
Protein, ur: NEGATIVE mg/dL
SPECIFIC GRAVITY, URINE: 1.012 (ref 1.005–1.030)

## 2016-06-14 LAB — URINE CULTURE

## 2016-06-20 DIAGNOSIS — I482 Chronic atrial fibrillation: Secondary | ICD-10-CM | POA: Diagnosis not present

## 2016-06-20 DIAGNOSIS — F325 Major depressive disorder, single episode, in full remission: Secondary | ICD-10-CM | POA: Diagnosis not present

## 2016-06-20 DIAGNOSIS — M810 Age-related osteoporosis without current pathological fracture: Secondary | ICD-10-CM | POA: Diagnosis not present

## 2016-06-20 DIAGNOSIS — I10 Essential (primary) hypertension: Secondary | ICD-10-CM | POA: Diagnosis not present

## 2016-06-20 DIAGNOSIS — J449 Chronic obstructive pulmonary disease, unspecified: Secondary | ICD-10-CM | POA: Diagnosis not present

## 2016-07-03 ENCOUNTER — Encounter
Admission: RE | Admit: 2016-07-03 | Discharge: 2016-07-03 | Disposition: A | Discharge status: Home / Self Care | Payer: Medicare Other | Source: Ambulatory Visit | Attending: Internal Medicine | Admitting: Internal Medicine

## 2016-07-03 DIAGNOSIS — R05 Cough: Secondary | ICD-10-CM | POA: Insufficient documentation

## 2016-07-08 ENCOUNTER — Non-Acute Institutional Stay (SKILLED_NURSING_FACILITY): Payer: Medicare Other | Admitting: Gerontology

## 2016-07-08 DIAGNOSIS — J441 Chronic obstructive pulmonary disease with (acute) exacerbation: Secondary | ICD-10-CM | POA: Diagnosis not present

## 2016-07-11 ENCOUNTER — Non-Acute Institutional Stay (SKILLED_NURSING_FACILITY): Payer: Medicare Other | Admitting: Gerontology

## 2016-07-11 DIAGNOSIS — J441 Chronic obstructive pulmonary disease with (acute) exacerbation: Secondary | ICD-10-CM

## 2016-07-11 DIAGNOSIS — R0982 Postnasal drip: Secondary | ICD-10-CM

## 2016-07-14 DIAGNOSIS — J441 Chronic obstructive pulmonary disease with (acute) exacerbation: Secondary | ICD-10-CM | POA: Diagnosis not present

## 2016-07-14 DIAGNOSIS — M810 Age-related osteoporosis without current pathological fracture: Secondary | ICD-10-CM | POA: Diagnosis not present

## 2016-07-21 ENCOUNTER — Other Ambulatory Visit: Payer: Self-pay | Admitting: Gerontology

## 2016-07-21 ENCOUNTER — Non-Acute Institutional Stay (SKILLED_NURSING_FACILITY): Payer: Medicare Other | Admitting: Gerontology

## 2016-07-21 DIAGNOSIS — S32010A Wedge compression fracture of first lumbar vertebra, initial encounter for closed fracture: Secondary | ICD-10-CM

## 2016-07-21 DIAGNOSIS — J441 Chronic obstructive pulmonary disease with (acute) exacerbation: Secondary | ICD-10-CM | POA: Diagnosis not present

## 2016-07-21 DIAGNOSIS — R05 Cough: Secondary | ICD-10-CM | POA: Diagnosis not present

## 2016-07-21 LAB — CBC WITH DIFFERENTIAL/PLATELET
BASOS ABS: 0 10*3/uL (ref 0–0.1)
BASOS PCT: 0 %
BLASTS: 0 %
Band Neutrophils: 0 %
EOS ABS: 0 10*3/uL (ref 0–0.7)
EOS PCT: 0 %
HEMATOCRIT: 34.1 % — AB (ref 35.0–47.0)
HEMOGLOBIN: 11.2 g/dL — AB (ref 12.0–16.0)
Lymphocytes Relative: 9 %
Lymphs Abs: 1 10*3/uL (ref 1.0–3.6)
MCH: 29.9 pg (ref 26.0–34.0)
MCHC: 32.9 g/dL (ref 32.0–36.0)
MCV: 90.8 fL (ref 80.0–100.0)
METAMYELOCYTES PCT: 0 %
MONOS PCT: 2 %
Monocytes Absolute: 0.2 10*3/uL (ref 0.2–0.9)
Myelocytes: 2 %
NEUTROS ABS: 9.9 10*3/uL — AB (ref 1.4–6.5)
Neutrophils Relative %: 87 %
Other: 0 %
Platelets: 207 10*3/uL (ref 150–440)
Promyelocytes Absolute: 0 %
RBC: 3.76 MIL/uL — ABNORMAL LOW (ref 3.80–5.20)
RDW: 16.3 % — AB (ref 11.5–14.5)
WBC: 11.1 10*3/uL — AB (ref 3.6–11.0)
nRBC: 0 /100 WBC

## 2016-07-21 LAB — COMPREHENSIVE METABOLIC PANEL
ALBUMIN: 3.7 g/dL (ref 3.5–5.0)
ALT: 14 U/L (ref 14–54)
AST: 19 U/L (ref 15–41)
Alkaline Phosphatase: 113 U/L (ref 38–126)
Anion gap: 9 (ref 5–15)
BILIRUBIN TOTAL: 0.7 mg/dL (ref 0.3–1.2)
BUN: 18 mg/dL (ref 6–20)
CHLORIDE: 102 mmol/L (ref 101–111)
CO2: 27 mmol/L (ref 22–32)
Calcium: 8.4 mg/dL — ABNORMAL LOW (ref 8.9–10.3)
Creatinine, Ser: 1.28 mg/dL — ABNORMAL HIGH (ref 0.44–1.00)
GFR calc Af Amer: 42 mL/min — ABNORMAL LOW (ref >60)
GFR calc non Af Amer: 36 mL/min — ABNORMAL LOW (ref >60)
Glucose, Bld: 202 mg/dL — ABNORMAL HIGH (ref 65–99)
POTASSIUM: 4.5 mmol/L (ref 3.5–5.1)
SODIUM: 138 mmol/L (ref 135–145)
TOTAL PROTEIN: 5.7 g/dL — AB (ref 6.5–8.1)

## 2016-07-22 ENCOUNTER — Other Ambulatory Visit: Payer: Self-pay | Admitting: Gerontology

## 2016-07-22 DIAGNOSIS — M549 Dorsalgia, unspecified: Secondary | ICD-10-CM

## 2016-07-22 NOTE — Progress Notes (Signed)
Location:      Place of Service:  SNF (31) Provider:  Toni Arthurs, NP-C  DAVID Lovie Macadamia, MD  Patient Care Team: Juluis Pitch, MD as PCP - General (Family Medicine)  Extended Emergency Contact Information Primary Emergency Contact: [name] Address: [address]          [place], [place] [address] [name] of [place] Phone: [phone] Relation: Daughter Secondary Emergency Contact: Lingerfelt,Brad Address: [place], [place] [address] [name] of [place] Phone: [phone] Relation: Grandson  Code Status:  dnr Goals of care: Advanced Directive information Advanced Directives 02/25/2016  Does Patient Have a Medical Advance Directive? No  Type of Advance Directive -  Would patient like information on creating a medical advance directive? No - patient declined information     Chief Complaint  Patient presents with  . Acute Visit    HPI:  Pt is a 83 y.o. female seen today for a follow up visit for symptoms of COPD exacerbation. Pt had cough and congestion, non-productive cough. Afebrile. Has been ongoing for several days. Pt felt weak, malaise. Pt denies n/v/d/f/c/cp/sob/ha/abd pain/dizziness. Pt does report back pain in the center of her back that worsens with cough or deep breath. Pt was treated with a Z-pak, Solumedrol injection, Prednisone taper, scheduled guaifenesin and tesselon perles. Pt continues to cough, but it has improved. Pt does report cough worse at night. Feels like she has PND.  VSS. No other complaints.    Past Medical History:  Diagnosis Date  . Adenoma of rectum   . Adrenal adenoma   . Anemia   . Ascending aortic aneurysm ([place])    a. s/p repair 07/2014  . Atrophic vaginitis   . COPD (chronic obstructive pulmonary disease) ([place])   . Coronary artery disease, non-occlusive    a. LHC 06/2014 without evidence of obstructive disease  . Diverticulosis   . Dysphagia   . Frequent UTI   . Hemiplegia and  hemiparesis ([place])    following cerebral infarction affecting left non-dominant side  . Microscopic hematuria   . Nonrheumatic aortic valve insufficiency   . Osteoporosis   . Paroxysmal atrial fibrillation (HCC)   . Renal cyst   . Stroke [facility])    a. post-operative setting in 07/2014 leading to left UE hemiapresis and left LE weakness  . Urge incontinence    Past Surgical History:  Procedure Laterality Date  . ABDOMINAL HYSTERECTOMY    . AORTOILIAC BYPASS    . INTRAMEDULLARY (IM) NAIL INTERTROCHANTERIC Left 02/26/2016   Procedure: INTRAMEDULLARY (IM) NAIL INTERTROCHANTRIC;  Surgeon: Earnestine Leys, MD;  Location: [facility] ORS;  Service: Orthopedics;  Laterality: Left;    Allergies  Allergen Reactions  . Iodinated Diagnostic Agents Itching and Swelling  . Ciprofloxacin Other (See Comments)    Colitis  . Nitrofurantoin Diarrhea  . Solifenacin Other (See Comments)    indigestion  . Metronidazole Itching and Rash    Allergies as of 07/11/2016      Reactions   Iodinated Diagnostic Agents Itching, Swelling   Ciprofloxacin Other (See Comments)   Colitis   Nitrofurantoin Diarrhea   Solifenacin Other (See Comments)   indigestion   Metronidazole Itching, Rash      Medication List       Accurate as of 07/11/16 11:59 PM. Always use your most recent med list.          acetaminophen 325 MG tablet Commonly known as:  TYLENOL Take 650 mg by mouth every 4 (four) hours as needed for mild pain or fever.   albuterol 108 (90 Base) MCG/ACT inhaler Commonly known as:  PROVENTIL HFA;VENTOLIN HFA Inhale 2 puffs into the lungs every 4 (four) hours as needed for wheezing or shortness of breath.   artificial tears ointment Place 1 application into the left eye 4 (four) times daily.   atorvastatin 20 MG tablet Commonly known as:  LIPITOR Take 20 mg by mouth at bedtime.   benzonatate 100 MG capsule Commonly known as:  TESSALON Take 100 mg by mouth every 8 (eight) hours as needed for cough.     bisacodyl 10 MG suppository Commonly known as:  DULCOLAX Place 10 mg rectally daily as needed for moderate constipation.   clonazePAM 0.5 MG tablet Commonly known as:  KLONOPIN Take 0.25 mg by mouth daily.   cyanocobalamin 1000 MCG tablet Take 1,000 mcg by mouth daily.   diclofenac sodium 1 % Gel Commonly known as:  VOLTAREN Apply 4 g topically 4 (four) times daily as needed (pain).   diltiazem 60 MG tablet Commonly known as:  CARDIZEM Take 60 mg by mouth daily.   docusate sodium 100 MG capsule Commonly known as:  COLACE Take 1 capsule (100 mg total) by mouth 2 (two) times daily.   ferrous sulfate 325 (65 FE) MG tablet Take 1 tablet (325 mg total) by mouth 2 (two) times daily with a meal.   fluticasone-salmeterol 230-21 MCG/ACT inhaler Commonly known as:  ADVAIR HFA Inhale 2 puffs into the lungs 2 (two) times daily.   furosemide 40 MG tablet Commonly known as:  LASIX Take 60 mg by mouth 2 (two) times daily.   ipratropium 0.03 % nasal spray Commonly known as:  ATROVENT Place 2 sprays into both nostrils every 8 (eight) hours as needed for rhinitis.   magnesium hydroxide 400 MG/5ML suspension Commonly known as:  MILK OF MAGNESIA Take 30 mLs by mouth daily as needed for mild constipation.   Melatonin 3 MG Tabs Take 6 mg by mouth at bedtime.   methocarbamol 500 MG tablet Commonly known as:  ROBAXIN Take 500 mg by mouth every 6 (six) hours as needed for muscle spasms.   omeprazole 40 MG capsule Commonly known as:  PRILOSEC Take 40 mg by mouth daily.   oxyCODONE 5 MG immediate release tablet Commonly known as:  ROXICODONE Take 1 tablet (5 mg total) by mouth every 8 (eight) hours as needed for moderate pain or severe pain.   polyethylene glycol packet Commonly known as:  MIRALAX Take 17 g by mouth 2 (two) times daily.   polyvinyl alcohol-povidone 1.4-0.6 % ophthalmic solution Commonly known as:  HYPOTEARS Place 1 drop into both eyes as needed (dry eyes).    potassium chloride SA 20 MEQ tablet Commonly known as:  K-DUR,KLOR-CON Take 20 mEq by mouth 2 (two) times daily.   predniSONE 20 MG tablet Commonly known as:  DELTASONE Take 1 tablet (20 mg total) by mouth daily with breakfast.   senna 8.6 MG Tabs tablet Commonly known as:  SENOKOT Take 1 tablet (8.6 mg total) by mouth daily.   sertraline 50 MG tablet Commonly known as:  ZOLOFT Take 50 mg by mouth daily.   tiotropium 18 MCG inhalation capsule Commonly known as:  SPIRIVA Place 18 mcg into inhaler and inhale daily.   traZODone 50 MG tablet Commonly known as:  DESYREL Take 50 mg by mouth at bedtime as needed for sleep.   trimethoprim 100  MG tablet Commonly known as:  TRIMPEX Take 100 mg by mouth daily.       Review of Systems  Constitutional: Negative for activity change, appetite change, chills, diaphoresis, fatigue, fever and unexpected weight change (weight gain).  HENT: Positive for postnasal drip and rhinorrhea. Negative for congestion, ear pain, mouth sores, nosebleeds, sinus pain, sinus pressure, sneezing, sore throat, trouble swallowing and voice change.   Eyes: Negative.   Respiratory: Negative for apnea, cough, choking, chest tightness, shortness of breath and wheezing.   Cardiovascular: Negative for chest pain, palpitations and leg swelling.  Gastrointestinal: Negative for abdominal distention, abdominal pain, constipation, diarrhea and nausea.  Genitourinary: Negative for difficulty urinating, dysuria, frequency (chronic nocturia) and urgency.  Musculoskeletal: Positive for arthralgias (typical arthritis) and back pain (chronic). Negative for gait problem and myalgias.  Skin: Negative for color change, pallor, rash and wound.  Neurological: Positive for weakness. Negative for dizziness, tremors, syncope, speech difficulty, numbness and headaches.  Psychiatric/Behavioral: Negative for agitation and behavioral problems.  All other systems reviewed and are  negative.   Immunization History  Administered Date(s) Administered  . Influenza-Unspecified 02/02/2014   Pertinent  Health Maintenance Due  Topic Date Due  . DEXA SCAN  07/01/1992  . PNA vac Low Risk Adult (1 of 2 - PCV13) 07/01/1992  . INFLUENZA VACCINE  12/04/2015   No flowsheet data found. Functional Status Survey:    Vitals:   07/10/16 1100  BP: (!) 155/47  Pulse: 69  Resp: 20  Temp: 98.5 F (36.9 C)  SpO2: 100%  Weight: 129 lb (58.5 kg)   Body mass index is 24.37 kg/m. Physical Exam  Constitutional: She is oriented to person, place, and time. Vital signs are normal. She appears well-developed and well-nourished. She is active and cooperative. She does not appear ill. No distress.  HENT:  Head: Normocephalic and atraumatic.  Mouth/Throat: Uvula is midline, oropharynx is clear and moist and mucous membranes are normal. Mucous membranes are not pale, not dry and not cyanotic.  Eyes: Conjunctivae, EOM and lids are normal. Pupils are equal, round, and reactive to light.  Neck: Trachea normal, normal range of motion and full passive range of motion without pain. Neck supple. No JVD present. No tracheal deviation, no edema and no erythema present. No thyromegaly present.  Cardiovascular: Normal rate, regular rhythm, normal heart sounds and intact distal pulses.  Exam reveals no gallop, no distant heart sounds and no friction rub.   No murmur heard. Pulses:      Dorsalis pedis pulses are 1+ on the right side, and 1+ on the left side.  3+ B-pitting edema. TED Hose in place  Pulmonary/Chest: Effort normal. No accessory muscle usage. No respiratory distress. She has no decreased breath sounds. She has no wheezes. She has no rhonchi. She has no rales. She exhibits no tenderness.  Abdominal: Soft. Normal appearance and bowel sounds are normal. She exhibits no distension and no ascites. There is no tenderness.  Musculoskeletal: Normal range of motion. She exhibits no edema or  tenderness.  Expected osteoarthritis, stiffness  Neurological: She is alert and oriented to person, place, and time. She has normal strength.  Skin: Skin is warm, dry and intact. She is not diaphoretic. No cyanosis. No pallor. Nails show no clubbing.  Psychiatric: She has a normal mood and affect. Her speech is normal and behavior is normal. Judgment and thought content normal. Cognition and memory are normal.  Nursing note and vitals reviewed.   Labs reviewed:  Recent Labs  03/01/16 0450 06/04/16 2100 07/21/16 1500  NA 139 137 138  K 4.3 4.9 4.5  CL 103 105 102  CO2 31 26 27   GLUCOSE 112* 107* 202*  BUN 27* 19 18  CREATININE 1.03* 0.96 1.28*  CALCIUM 8.1* 8.2* 8.4*    Recent Labs  02/25/16 1243 06/04/16 2100 07/21/16 1500  AST 19 17 19   ALT 32 14 14  ALKPHOS 110 94 113  BILITOT 1.1 0.5 0.7  PROT 6.1* 5.1* 5.7*  ALBUMIN 3.6 3.2* 3.7    Recent Labs  03/06/16 0540 06/04/16 2100 07/21/16 1500  WBC 6.9 8.6 11.1*  NEUTROABS 5.6 7.3* 9.9*  HGB 8.8* 10.1* 11.2*  HCT 26.0* 30.2* 34.1*  MCV 86.7 88.6 90.8  PLT 244 169 207   Lab Results  Component Value Date   TSH 1.050 02/25/2016   Lab Results  Component Value Date   HGBA1C 5.9 (H) 02/25/2016   Lab Results  Component Value Date   CHOL 127 02/09/2015   HDL 72 02/09/2015   LDLCALC 49 02/09/2015   TRIG 28 02/09/2015   CHOLHDL 1.8 02/09/2015    Significant Diagnostic Results in last 30 days:  No results found.  Assessment/Plan 1. COPD exacerbation (HCC)  Resolved  Tesselon Perles 200 mg po TID scheduled x 10 days  Prednisone 40 mg po Q Day x 3 days, then  Prednisone 20 mg po Q Day- regular daily dose  2. Post- Nasal Drip  Flonase- 2 sprays each nostril Q HS for post nasal drip  Family/ staff Communication:   Total Time:  Documentation:  Face to Face:  Family/Phone:   Labs/tests ordered:    Medication list reviewed and assessed for continued appropriateness.  Vikki Ports,  NP-C Geriatrics [facility] [phone], [place] [address] Cell Phone (Mon-Fri 8am-5pm):  [phone] On Call:  [phone] & follow prompts after 5pm & weekends Office Phone:  [phone] Office Fax:  [phone]

## 2016-07-22 NOTE — Progress Notes (Signed)
Location:      Place of Service:  SNF (31) Provider:  Toni Arthurs, NP-C  DAVID Lovie Macadamia, MD  Patient Care Team: Juluis Pitch, MD as PCP - General (Family Medicine)  Extended Emergency Contact Information Primary Emergency Contact: [name] Address: [address]          [place], [place] [address] [name] of [place] Phone: [phone] Relation: Daughter Secondary Emergency Contact: Lingerfelt,Brad Address: [place], [place] [address] [name] of [place] Phone: [phone] Relation: Grandson  Code Status:  dnr Goals of care: Advanced Directive information Advanced Directives 02/25/2016  Does Patient Have a Medical Advance Directive? No  Type of Advance Directive -  Would patient like information on creating a medical advance directive? No - patient declined information     Chief Complaint  Patient presents with  . Acute Visit    HPI:  Pt is a 83 y.o. female seen today for an acute visit for symptoms of COPD exacerbation. Pt has cough and congestion, non-productive cough. Afebrile. Has been ongoing for several days. Pt feels weak, malaise. Pt denies n/v/d/f/c/cp/sob/ha/abd pain/dizziness. Pt does report back pain in the center of her back that worsens with cough or deep breath. Nothing has significantly improved symptoms. VSS. No other complaints.    Past Medical History:  Diagnosis Date  . Adenoma of rectum   . Adrenal adenoma   . Anemia   . Ascending aortic aneurysm ([place])    a. s/p repair 07/2014  . Atrophic vaginitis   . COPD (chronic obstructive pulmonary disease) ([place])   . Coronary artery disease, non-occlusive    a. LHC 06/2014 without evidence of obstructive disease  . Diverticulosis   . Dysphagia   . Frequent UTI   . Hemiplegia and hemiparesis ([place])    following cerebral infarction affecting left non-dominant side  . Microscopic hematuria   . Nonrheumatic aortic valve insufficiency   . Osteoporosis    . Paroxysmal atrial fibrillation (HCC)   . Renal cyst   . Stroke [facility])    a. post-operative setting in 07/2014 leading to left UE hemiapresis and left LE weakness  . Urge incontinence    Past Surgical History:  Procedure Laterality Date  . ABDOMINAL HYSTERECTOMY    . AORTOILIAC BYPASS    . INTRAMEDULLARY (IM) NAIL INTERTROCHANTERIC Left 02/26/2016   Procedure: INTRAMEDULLARY (IM) NAIL INTERTROCHANTRIC;  Surgeon: Earnestine Leys, MD;  Location: [facility] ORS;  Service: Orthopedics;  Laterality: Left;    Allergies  Allergen Reactions  . Iodinated Diagnostic Agents Itching and Swelling  . Ciprofloxacin Other (See Comments)    Colitis  . Nitrofurantoin Diarrhea  . Solifenacin Other (See Comments)    indigestion  . Metronidazole Itching and Rash    Allergies as of 07/08/2016      Reactions   Iodinated Diagnostic Agents Itching, Swelling   Ciprofloxacin Other (See Comments)   Colitis   Nitrofurantoin Diarrhea   Solifenacin Other (See Comments)   indigestion   Metronidazole Itching, Rash      Medication List       Accurate as of 07/08/16 11:59 PM. Always use your most recent med list.          acetaminophen 325 MG tablet Commonly known as:  TYLENOL Take 650 mg by mouth every 4 (four) hours as needed for mild pain or fever.   albuterol 108 (90 Base) MCG/ACT inhaler Commonly known as:  PROVENTIL HFA;VENTOLIN HFA  Inhale 2 puffs into the lungs every 4 (four) hours as needed for wheezing or shortness of breath.   artificial tears ointment Place 1 application into the left eye 4 (four) times daily.   atorvastatin 20 MG tablet Commonly known as:  LIPITOR Take 20 mg by mouth at bedtime.   benzonatate 100 MG capsule Commonly known as:  TESSALON Take 100 mg by mouth every 8 (eight) hours as needed for cough.   bisacodyl 10 MG suppository Commonly known as:  DULCOLAX Place 10 mg rectally daily as needed for moderate constipation.   clonazePAM 0.5 MG tablet Commonly known as:   KLONOPIN Take 0.25 mg by mouth daily.   cyanocobalamin 1000 MCG tablet Take 1,000 mcg by mouth daily.   diclofenac sodium 1 % Gel Commonly known as:  VOLTAREN Apply 4 g topically 4 (four) times daily as needed (pain).   diltiazem 60 MG tablet Commonly known as:  CARDIZEM Take 60 mg by mouth daily.   docusate sodium 100 MG capsule Commonly known as:  COLACE Take 1 capsule (100 mg total) by mouth 2 (two) times daily.   ferrous sulfate 325 (65 FE) MG tablet Take 1 tablet (325 mg total) by mouth 2 (two) times daily with a meal.   fluticasone-salmeterol 230-21 MCG/ACT inhaler Commonly known as:  ADVAIR HFA Inhale 2 puffs into the lungs 2 (two) times daily.   furosemide 40 MG tablet Commonly known as:  LASIX Take 60 mg by mouth 2 (two) times daily.   ipratropium 0.03 % nasal spray Commonly known as:  ATROVENT Place 2 sprays into both nostrils every 8 (eight) hours as needed for rhinitis.   magnesium hydroxide 400 MG/5ML suspension Commonly known as:  MILK OF MAGNESIA Take 30 mLs by mouth daily as needed for mild constipation.   Melatonin 3 MG Tabs Take 6 mg by mouth at bedtime.   methocarbamol 500 MG tablet Commonly known as:  ROBAXIN Take 500 mg by mouth every 6 (six) hours as needed for muscle spasms.   omeprazole 40 MG capsule Commonly known as:  PRILOSEC Take 40 mg by mouth daily.   oxyCODONE 5 MG immediate release tablet Commonly known as:  ROXICODONE Take 1 tablet (5 mg total) by mouth every 8 (eight) hours as needed for moderate pain or severe pain.   polyethylene glycol packet Commonly known as:  MIRALAX Take 17 g by mouth 2 (two) times daily.   polyvinyl alcohol-povidone 1.4-0.6 % ophthalmic solution Commonly known as:  HYPOTEARS Place 1 drop into both eyes as needed (dry eyes).   potassium chloride SA 20 MEQ tablet Commonly known as:  K-DUR,KLOR-CON Take 20 mEq by mouth 2 (two) times daily.   predniSONE 20 MG tablet Commonly known as:   DELTASONE Take 1 tablet (20 mg total) by mouth daily with breakfast.   senna 8.6 MG Tabs tablet Commonly known as:  SENOKOT Take 1 tablet (8.6 mg total) by mouth daily.   sertraline 50 MG tablet Commonly known as:  ZOLOFT Take 50 mg by mouth daily.   tiotropium 18 MCG inhalation capsule Commonly known as:  SPIRIVA Place 18 mcg into inhaler and inhale daily.   traZODone 50 MG tablet Commonly known as:  DESYREL Take 50 mg by mouth at bedtime as needed for sleep.   trimethoprim 100 MG tablet Commonly known as:  TRIMPEX Take 100 mg by mouth daily.       Review of Systems  Constitutional: Negative for activity change, appetite change, chills, diaphoresis, fatigue,  fever and unexpected weight change (weight gain).  HENT: Positive for congestion. Negative for ear pain, mouth sores, nosebleeds, postnasal drip, rhinorrhea, sinus pain, sinus pressure, sneezing, sore throat, trouble swallowing and voice change.   Eyes: Negative.   Respiratory: Positive for cough. Negative for apnea, choking, chest tightness, shortness of breath and wheezing.   Cardiovascular: Negative for chest pain, palpitations and leg swelling.  Gastrointestinal: Negative for abdominal distention, abdominal pain, constipation, diarrhea and nausea.  Genitourinary: Negative for difficulty urinating, dysuria, frequency (chronic nocturia) and urgency.  Musculoskeletal: Positive for arthralgias (typical arthritis) and back pain (chronic). Negative for gait problem and myalgias.  Skin: Negative for color change, pallor, rash and wound.  Neurological: Positive for weakness. Negative for dizziness, tremors, syncope, speech difficulty, numbness and headaches.  Psychiatric/Behavioral: Negative for agitation and behavioral problems.  All other systems reviewed and are negative.   Immunization History  Administered Date(s) Administered  . Influenza-Unspecified 02/02/2014   Pertinent  Health Maintenance Due  Topic Date Due   . DEXA SCAN  07/01/1992  . PNA vac Low Risk Adult (1 of 2 - PCV13) 07/01/1992  . INFLUENZA VACCINE  12/04/2015   No flowsheet data found. Functional Status Survey:    Vitals:   07/08/16 0100  BP: (!) 139/56  Pulse: 74  Resp: 14  Temp: 97.8 F (36.6 C)  SpO2: 94%  Weight: 127 lb 4.8 oz (57.7 kg)   Body mass index is 24.05 kg/m. Physical Exam  Constitutional: She is oriented to person, place, and time. Vital signs are normal. She appears well-developed and well-nourished. She is active and cooperative. She does not appear ill. No distress.  HENT:  Head: Normocephalic and atraumatic.  Mouth/Throat: Uvula is midline, oropharynx is clear and moist and mucous membranes are normal. Mucous membranes are not pale, not dry and not cyanotic.  Eyes: Conjunctivae, EOM and lids are normal. Pupils are equal, round, and reactive to light.  Neck: Trachea normal, normal range of motion and full passive range of motion without pain. Neck supple. No JVD present. No tracheal deviation, no edema and no erythema present. No thyromegaly present.  Cardiovascular: Normal rate, regular rhythm, normal heart sounds and intact distal pulses.  Exam reveals no gallop, no distant heart sounds and no friction rub.   No murmur heard. Pulses:      Dorsalis pedis pulses are 1+ on the right side, and 1+ on the left side.  3+ B-pitting edema. TED Hose in place  Pulmonary/Chest: Effort normal. No accessory muscle usage. No respiratory distress. She has no decreased breath sounds. She has wheezes (anterior. Clear posterior after cough) in the right upper field, the right middle field and the right lower field. She has rhonchi (anterior. Clear posterior after cough) in the right upper field, the right middle field and the right lower field. She has no rales. She exhibits no tenderness.  Abdominal: Soft. Normal appearance and bowel sounds are normal. She exhibits no distension and no ascites. There is no tenderness.   Musculoskeletal: Normal range of motion. She exhibits no edema or tenderness.  Expected osteoarthritis, stiffness  Neurological: She is alert and oriented to person, place, and time. She has normal strength.  Skin: Skin is warm, dry and intact. She is not diaphoretic. No cyanosis. No pallor. Nails show no clubbing.  Psychiatric: She has a normal mood and affect. Her speech is normal and behavior is normal. Judgment and thought content normal. Cognition and memory are normal.  Nursing note and vitals reviewed.  Labs reviewed:  Recent Labs  03/01/16 0450 06/04/16 2100 07/21/16 1500  NA 139 137 138  K 4.3 4.9 4.5  CL 103 105 102  CO2 31 26 27   GLUCOSE 112* 107* 202*  BUN 27* 19 18  CREATININE 1.03* 0.96 1.28*  CALCIUM 8.1* 8.2* 8.4*    Recent Labs  02/25/16 1243 06/04/16 2100 07/21/16 1500  AST 19 17 19   ALT 32 14 14  ALKPHOS 110 94 113  BILITOT 1.1 0.5 0.7  PROT 6.1* 5.1* 5.7*  ALBUMIN 3.6 3.2* 3.7    Recent Labs  03/06/16 0540 06/04/16 2100 07/21/16 1500  WBC 6.9 8.6 11.1*  NEUTROABS 5.6 7.3* 9.9*  HGB 8.8* 10.1* 11.2*  HCT 26.0* 30.2* 34.1*  MCV 86.7 88.6 90.8  PLT 244 169 207   Lab Results  Component Value Date   TSH 1.050 02/25/2016   Lab Results  Component Value Date   HGBA1C 5.9 (H) 02/25/2016   Lab Results  Component Value Date   CHOL 127 02/09/2015   HDL 72 02/09/2015   LDLCALC 49 02/09/2015   TRIG 28 02/09/2015   CHOLHDL 1.8 02/09/2015    Significant Diagnostic Results in last 30 days:  No results found.  Assessment/Plan 1. COPD exacerbation (HCC)  Azithromycin 250 mg- 2 tablets today, then 1 daily x 4 days  Tesselon Perles 200 mg po TID scheduled x 10 days  Guaifenesin 10 mL PO TID x 3 days  Solumedrol 125 mg IM x 1  Prednisone 60 mg po Q Day x 3 days, then  Prednisone 40 mg po Q Day x 3 days, then  Prednisone 20 mg po Q Day- regular daily dose  Family/ staff Communication:   Total Time:  Documentation:  Face to  Face:  Family/Phone:   Labs/tests ordered:    Medication list reviewed and assessed for continued appropriateness.  Vikki Ports, NP-C Geriatrics [facility] [phone] [place], [place] [address] Cell Phone (Mon-Fri 8am-5pm):  [phone] On Call:  [phone] & follow prompts after 5pm & weekends Office Phone:  [phone] Office Fax:  [phone]

## 2016-07-22 NOTE — Progress Notes (Signed)
Location:      Place of Service:  SNF (31) Provider:  Toni Arthurs, NP-C  DAVID Lovie Macadamia, MD  Patient Care Team: Juluis Pitch, MD as PCP - General (Family Medicine)  Extended Emergency Contact Information Primary Emergency Contact: [name] Address: [address]          [place], [place] [address] [name] of [place] Phone: [phone] Relation: Daughter Secondary Emergency Contact: Lingerfelt,Brad Address: [place], [place] [address] [name] of [place] Phone: [phone] Relation: Grandson  Code Status:  dnr Goals of care: Advanced Directive information Advanced Directives 02/25/2016  Does Patient Have a Medical Advance Directive? No  Type of Advance Directive -  Would patient like information on creating a medical advance directive? No - patient declined information     Chief Complaint  Patient presents with  . Acute Visit    HPI:  Pt is a 83 y.o. female seen today for an acute visit for recurrence of symptoms of COPD exacerbation. Pt has cough and congestion, non-productive cough. Afebrile. Has been ongoing again for several days. Pt feels weak, malaise. Pt denies n/v/d/f/c/cp/sob/ha/abd pain/dizziness. Pt does report severe back pain in the center of her back that worsens with cough or deep breath. Nothing has significantly improved symptoms. Pt reports she felt better after last round of antibiotics for one day, then back to feeling bad. Cough is a wet sounding, coarse cough. Lungs are Rhonchus today. VSS. No other complaints.    Past Medical History:  Diagnosis Date  . Adenoma of rectum   . Adrenal adenoma   . Anemia   . Ascending aortic aneurysm ([place])    a. s/p repair 07/2014  . Atrophic vaginitis   . COPD (chronic obstructive pulmonary disease) ([place])   . Coronary artery disease, non-occlusive    a. LHC 06/2014 without evidence of obstructive disease  . Diverticulosis   . Dysphagia   . Frequent UTI   .  Hemiplegia and hemiparesis ([place])    following cerebral infarction affecting left non-dominant side  . Microscopic hematuria   . Nonrheumatic aortic valve insufficiency   . Osteoporosis   . Paroxysmal atrial fibrillation (HCC)   . Renal cyst   . Stroke [facility])    a. post-operative setting in 07/2014 leading to left UE hemiapresis and left LE weakness  . Urge incontinence    Past Surgical History:  Procedure Laterality Date  . ABDOMINAL HYSTERECTOMY    . AORTOILIAC BYPASS    . INTRAMEDULLARY (IM) NAIL INTERTROCHANTERIC Left 02/26/2016   Procedure: INTRAMEDULLARY (IM) NAIL INTERTROCHANTRIC;  Surgeon: Earnestine Leys, MD;  Location: [facility] ORS;  Service: Orthopedics;  Laterality: Left;    Allergies  Allergen Reactions  . Iodinated Diagnostic Agents Itching and Swelling  . Ciprofloxacin Other (See Comments)    Colitis  . Nitrofurantoin Diarrhea  . Solifenacin Other (See Comments)    indigestion  . Metronidazole Itching and Rash    Allergies as of 07/21/2016      Reactions   Iodinated Diagnostic Agents Itching, Swelling   Ciprofloxacin Other (See Comments)   Colitis   Nitrofurantoin Diarrhea   Solifenacin Other (See Comments)   indigestion   Metronidazole Itching, Rash      Medication List       Accurate as of 07/21/16 11:59 PM. Always use your most recent med list.          acetaminophen 325 MG tablet Commonly known as:  TYLENOL Take 650 mg by mouth every 4 (four) hours as needed for mild pain or fever.   albuterol 108 (90 Base) MCG/ACT inhaler Commonly known as:  PROVENTIL HFA;VENTOLIN HFA Inhale 2 puffs into the lungs every 4 (four) hours as needed for wheezing or shortness of breath.   artificial tears ointment Place 1 application into the left eye 4 (four) times daily.   atorvastatin 20 MG tablet Commonly known as:  LIPITOR Take 20 mg by mouth at bedtime.   benzonatate 100 MG capsule Commonly known as:  TESSALON Take 100 mg by mouth every 8 (eight) hours as  needed for cough.   bisacodyl 10 MG suppository Commonly known as:  DULCOLAX Place 10 mg rectally daily as needed for moderate constipation.   clonazePAM 0.5 MG tablet Commonly known as:  KLONOPIN Take 0.25 mg by mouth daily.   cyanocobalamin 1000 MCG tablet Take 1,000 mcg by mouth daily.   diclofenac sodium 1 % Gel Commonly known as:  VOLTAREN Apply 4 g topically 4 (four) times daily as needed (pain).   diltiazem 60 MG tablet Commonly known as:  CARDIZEM Take 60 mg by mouth daily.   docusate sodium 100 MG capsule Commonly known as:  COLACE Take 1 capsule (100 mg total) by mouth 2 (two) times daily.   ferrous sulfate 325 (65 FE) MG tablet Take 1 tablet (325 mg total) by mouth 2 (two) times daily with a meal.   fluticasone-salmeterol 230-21 MCG/ACT inhaler Commonly known as:  ADVAIR HFA Inhale 2 puffs into the lungs 2 (two) times daily.   furosemide 40 MG tablet Commonly known as:  LASIX Take 60 mg by mouth 2 (two) times daily.   ipratropium 0.03 % nasal spray Commonly known as:  ATROVENT Place 2 sprays into both nostrils every 8 (eight) hours as needed for rhinitis.   magnesium hydroxide 400 MG/5ML suspension Commonly known as:  MILK OF MAGNESIA Take 30 mLs by mouth daily as needed for mild constipation.   Melatonin 3 MG Tabs Take 6 mg by mouth at bedtime.   methocarbamol 500 MG tablet Commonly known as:  ROBAXIN Take 500 mg by mouth every 6 (six) hours as needed for muscle spasms.   omeprazole 40 MG capsule Commonly known as:  PRILOSEC Take 40 mg by mouth daily.   oxyCODONE 5 MG immediate release tablet Commonly known as:  ROXICODONE Take 1 tablet (5 mg total) by mouth every 8 (eight) hours as needed for moderate pain or severe pain.   polyethylene glycol packet Commonly known as:  MIRALAX Take 17 g by mouth 2 (two) times daily.   polyvinyl alcohol-povidone 1.4-0.6 % ophthalmic solution Commonly known as:  HYPOTEARS Place 1 drop into both eyes as  needed (dry eyes).   potassium chloride SA 20 MEQ tablet Commonly known as:  K-DUR,KLOR-CON Take 20 mEq by mouth 2 (two) times daily.   predniSONE 20 MG tablet Commonly known as:  DELTASONE Take 1 tablet (20 mg total) by mouth daily with breakfast.   senna 8.6 MG Tabs tablet Commonly known as:  SENOKOT Take 1 tablet (8.6 mg total) by mouth daily.   sertraline 50 MG tablet Commonly known as:  ZOLOFT Take 50 mg by mouth daily.   tiotropium 18 MCG inhalation capsule Commonly known as:  SPIRIVA Place 18 mcg into inhaler and inhale daily.   traZODone 50 MG tablet Commonly known as:  DESYREL Take 50 mg by mouth at bedtime as needed for sleep.   trimethoprim 100 MG  tablet Commonly known as:  TRIMPEX Take 100 mg by mouth daily.       Review of Systems  Constitutional: Negative for activity change, appetite change, chills, diaphoresis, fatigue, fever and unexpected weight change (weight gain).  HENT: Positive for congestion. Negative for ear pain, mouth sores, nosebleeds, postnasal drip, rhinorrhea, sinus pain, sinus pressure, sneezing, sore throat, trouble swallowing and voice change.   Eyes: Negative.   Respiratory: Positive for cough. Negative for apnea, choking, chest tightness, shortness of breath and wheezing.   Cardiovascular: Negative for chest pain, palpitations and leg swelling.  Gastrointestinal: Negative for abdominal distention, abdominal pain, constipation, diarrhea and nausea.  Genitourinary: Negative for difficulty urinating, dysuria, frequency (chronic nocturia) and urgency.  Musculoskeletal: Positive for arthralgias (typical arthritis) and back pain (chronic). Negative for gait problem and myalgias.  Skin: Negative for color change, pallor, rash and wound.  Neurological: Positive for weakness. Negative for dizziness, tremors, syncope, speech difficulty, numbness and headaches.  Psychiatric/Behavioral: Negative for agitation and behavioral problems.  All other  systems reviewed and are negative.   Immunization History  Administered Date(s) Administered  . Influenza-Unspecified 02/02/2014   Pertinent  Health Maintenance Due  Topic Date Due  . DEXA SCAN  07/01/1992  . PNA vac Low Risk Adult (1 of 2 - PCV13) 07/01/1992  . INFLUENZA VACCINE  12/04/2015   No flowsheet data found. Functional Status Survey:    There were no vitals filed for this visit. There is no height or weight on file to calculate BMI. Physical Exam  Constitutional: She is oriented to person, place, and time. Vital signs are normal. She appears well-developed and well-nourished. She is active and cooperative. She does not appear ill. No distress.  HENT:  Head: Normocephalic and atraumatic.  Mouth/Throat: Uvula is midline, oropharynx is clear and moist and mucous membranes are normal. Mucous membranes are not pale, not dry and not cyanotic.  Eyes: Conjunctivae, EOM and lids are normal. Pupils are equal, round, and reactive to light.  Neck: Trachea normal, normal range of motion and full passive range of motion without pain. Neck supple. No JVD present. No tracheal deviation, no edema and no erythema present. No thyromegaly present.  Cardiovascular: Normal rate, regular rhythm, normal heart sounds and intact distal pulses.  Exam reveals no gallop, no distant heart sounds and no friction rub.   No murmur heard. Pulses:      Dorsalis pedis pulses are 2+ on the right side, and 2+ on the left side.  1+ B-pitting edema. TED Hose in place  Pulmonary/Chest: Effort normal. No accessory muscle usage. No respiratory distress. She has no decreased breath sounds. She has wheezes (anterior. Clear posterior after cough) in the right upper field, the right middle field, the right lower field, the left upper field, the left middle field and the left lower field. She has rhonchi (anterior. Clear posterior after cough) in the right upper field, the right middle field, the right lower field, the  left upper field, the left middle field and the left lower field. She has rales in the right upper field, the right middle field, the right lower field, the left upper field, the left middle field and the left lower field. She exhibits no tenderness.  Abdominal: Soft. Normal appearance and bowel sounds are normal. She exhibits no distension and no ascites. There is no tenderness.  Musculoskeletal: Normal range of motion. She exhibits no edema or tenderness.  Expected osteoarthritis, stiffness  Neurological: She is alert and oriented to person, place,  and time. She has normal strength.  Skin: Skin is warm, dry and intact. She is not diaphoretic. No cyanosis. No pallor. Nails show no clubbing.  Psychiatric: She has a normal mood and affect. Her speech is normal and behavior is normal. Judgment and thought content normal. Cognition and memory are normal.  Nursing note and vitals reviewed.   Labs reviewed:  Recent Labs  03/01/16 0450 06/04/16 2100 07/21/16 1500  NA 139 137 138  K 4.3 4.9 4.5  CL 103 105 102  CO2 _0 GLUCOSE 112* 107* 202*  BUN 27* 19 18  CREATININE 1.03* 0.96 1.28*  CALCIUM 8.1* 8.2* 8.4*    Recent Labs  02/25/16 1243 06/04/16 2100 07/21/16 1500  AST _1 ALT 32 14 14  ALKPHOS 110 94 113  BILITOT 1.1 0.5 0.7  PROT 6.1* 5.1* 5.7*  ALBUMIN 3.6 3.2* 3.7    Recent Labs  03/06/16 0540 06/04/16 2100 07/21/16 1500  WBC 6.9 8.6 11.1*  NEUTROABS 5.6 7.3* 9.9*  HGB 8.8* 10.1* 11.2*  HCT 26.0* 30.2* 34.1*  MCV 86.7 88.6 90.8  PLT 244 169 207   Lab Results  Component Value Date   TSH 1.050 02/25/2016   Lab Results  Component Value Date   HGBA1C 5.9 (H) 02/25/2016   Lab Results  Component Value Date   CHOL 127 02/09/2015   HDL 72 02/09/2015   LDLCALC 49 02/09/2015   TRIG 28 02/09/2015   CHOLHDL 1.8 02/09/2015    Significant Diagnostic Results in last 30 days:  No results found.  Assessment/Plan 1. COPD exacerbation (HCC)  DC  Advair  Breo Ellipta 1 inhalation Q Day  Guaifensin 10 mL po QID x 3 days, give with 8 oz water  Doxycycline 100 mg po Q 12 hours x 7 days  Solumedrol 125 mg IM x 1 now  Duonebs QID x 3 days scheduled  Family/ staff Communication:   Total Time:  Documentation:  Face to Face:  Family/Phone:   Labs/tests ordered:  2-V CXR, Complete view lumbar xrays, MRI- lumbar spine, cbc, met c  Medication list reviewed and assessed for continued appropriateness.  Vikki Ports, NP-C Geriatrics [facility] [phone], [place] [address] Cell Phone (Mon-Fri 8am-5pm):  [phone] On Call:  [phone] & follow prompts after 5pm & weekends Office Phone:  [phone] Office Fax:  [phone]

## 2016-07-28 ENCOUNTER — Encounter: Payer: Self-pay | Admitting: Gerontology

## 2016-07-28 ENCOUNTER — Non-Acute Institutional Stay (SKILLED_NURSING_FACILITY): Payer: Medicare Other | Admitting: Gerontology

## 2016-07-28 DIAGNOSIS — J441 Chronic obstructive pulmonary disease with (acute) exacerbation: Secondary | ICD-10-CM | POA: Diagnosis not present

## 2016-07-28 NOTE — Progress Notes (Signed)
Location:      Place of Service:  SNF (31) Provider:  Toni Arthurs, NP-C  DAVID Lovie Macadamia, MD  Patient Care Team: Juluis Pitch, MD as PCP - General (Family Medicine)  Extended Emergency Contact Information Primary Emergency Contact: [name] Address: [address]          [place], [place] [address] [name] of [place] Phone: [phone] Relation: Daughter Secondary Emergency Contact: Lingerfelt,Brad Address: [place], [place] [address] [name] of [place] Phone: [phone] Relation: Grandson  Code Status:  dnr Goals of care: Advanced Directive information Advanced Directives 02/25/2016  Does Patient Have a Medical Advance Directive? No  Type of Advance Directive -  Would patient like information on creating a medical advance directive? No - patient declined information     Chief Complaint  Patient presents with  . Acute Visit    HPI:  Pt is a 83 y.o. female seen today for an acute visit for symptoms of COPD exacerbation. Pt has cough and congestion, non-productive cough. Afebrile. Has been ongoing for several weeks. Pt has had several rounds of different antibiotics. However, pt continues to cough, causing back pain. Pt denies n/v/d/f/c/cp/sob/ha/abd pain/dizziness. Pt does report back pain in the center of her back that worsens with cough or deep breath. Symptoms seemed to improve when pt was taking scheduled Duonebs. Will change to Combivent. If pt unable to inhale effectively, will change to scheduled Duonebs VSS. No other complaints. Pt agreeable to plan.   Past Medical History:  Diagnosis Date  . Adenoma of rectum   . Adrenal adenoma   . Anemia   . Ascending aortic aneurysm ([place])    a. s/p repair 07/2014  . Atrophic vaginitis   . COPD (chronic obstructive pulmonary disease) ([place])   . Coronary artery disease, non-occlusive    a. LHC 06/2014 without evidence of obstructive disease  . Diverticulosis   .  Dysphagia   . Frequent UTI   . Hemiplegia and hemiparesis ([place])    following cerebral infarction affecting left non-dominant side  . Microscopic hematuria   . Nonrheumatic aortic valve insufficiency   . Osteoporosis   . Paroxysmal atrial fibrillation (HCC)   . Renal cyst   . Stroke [facility])    a. post-operative setting in 07/2014 leading to left UE hemiapresis and left LE weakness  . Urge incontinence    Past Surgical History:  Procedure Laterality Date  . ABDOMINAL HYSTERECTOMY    . AORTOILIAC BYPASS    . INTRAMEDULLARY (IM) NAIL INTERTROCHANTERIC Left 02/26/2016   Procedure: INTRAMEDULLARY (IM) NAIL INTERTROCHANTRIC;  Surgeon: Earnestine Leys, MD;  Location: [facility] ORS;  Service: Orthopedics;  Laterality: Left;    Allergies  Allergen Reactions  . Iodinated Diagnostic Agents Itching and Swelling  . Ciprofloxacin Other (See Comments)    Colitis  . Nitrofurantoin Diarrhea  . Solifenacin Other (See Comments)    indigestion  . Metronidazole Itching and Rash    Allergies as of 07/28/2016      Reactions   Iodinated Diagnostic Agents Itching, Swelling   Ciprofloxacin Other (See Comments)   Colitis   Nitrofurantoin Diarrhea   Solifenacin Other (See Comments)   indigestion   Metronidazole Itching, Rash      Medication List       Accurate as of 07/28/16  9:34 PM. Always use your most recent med list.          acetaminophen 325 MG tablet  Commonly known as:  TYLENOL Take 650 mg by mouth every 4 (four) hours as needed for mild pain or fever.   albuterol 108 (90 Base) MCG/ACT inhaler Commonly known as:  PROVENTIL HFA;VENTOLIN HFA Inhale 2 puffs into the lungs every 4 (four) hours as needed for wheezing or shortness of breath.   artificial tears ointment Place 1 application into the left eye 4 (four) times daily.   atorvastatin 20 MG tablet Commonly known as:  LIPITOR Take 20 mg by mouth at bedtime.   benzonatate 100 MG capsule Commonly known as:  TESSALON Take 100 mg by  mouth every 8 (eight) hours as needed for cough.   bisacodyl 10 MG suppository Commonly known as:  DULCOLAX Place 10 mg rectally daily as needed for moderate constipation.   clonazePAM 0.5 MG tablet Commonly known as:  KLONOPIN Take 0.25 mg by mouth daily.   cyanocobalamin 1000 MCG tablet Take 1,000 mcg by mouth daily.   diclofenac sodium 1 % Gel Commonly known as:  VOLTAREN Apply 4 g topically 4 (four) times daily as needed (pain).   diltiazem 60 MG tablet Commonly known as:  CARDIZEM Take 60 mg by mouth daily.   docusate sodium 100 MG capsule Commonly known as:  COLACE Take 1 capsule (100 mg total) by mouth 2 (two) times daily.   ferrous sulfate 325 (65 FE) MG tablet Take 1 tablet (325 mg total) by mouth 2 (two) times daily with a meal.   fluticasone 110 MCG/ACT inhaler Commonly known as:  FLOVENT HFA Inhale into the lungs.   fluticasone-salmeterol 230-21 MCG/ACT inhaler Commonly known as:  ADVAIR HFA Inhale 2 puffs into the lungs 2 (two) times daily.   furosemide 40 MG tablet Commonly known as:  LASIX Take 60 mg by mouth 2 (two) times daily.   heparin 5000 UNIT/ML injection Inject 1 mL (5,000 Units total) into the skin every 8 (eight) hours.   ipratropium 0.03 % nasal spray Commonly known as:  ATROVENT Place 2 sprays into both nostrils every 8 (eight) hours as needed for rhinitis.   magnesium hydroxide 400 MG/5ML suspension Commonly known as:  MILK OF MAGNESIA Take 30 mLs by mouth daily as needed for mild constipation.   Melatonin 3 MG Tabs Take 6 mg by mouth at bedtime.   methocarbamol 500 MG tablet Commonly known as:  ROBAXIN Take 500 mg by mouth every 6 (six) hours as needed for muscle spasms.   omeprazole 40 MG capsule Commonly known as:  PRILOSEC Take 40 mg by mouth daily.   oxyCODONE 5 MG immediate release tablet Commonly known as:  ROXICODONE Take 1 tablet (5 mg total) by mouth every 8 (eight) hours as needed for moderate pain or severe  pain.   polyethylene glycol packet Commonly known as:  MIRALAX Take 17 g by mouth 2 (two) times daily.   polyvinyl alcohol-povidone 1.4-0.6 % ophthalmic solution Commonly known as:  HYPOTEARS Place 1 drop into both eyes as needed (dry eyes).   potassium chloride SA 20 MEQ tablet Commonly known as:  K-DUR,KLOR-CON Take 20 mEq by mouth 2 (two) times daily.   predniSONE 20 MG tablet Commonly known as:  DELTASONE Take 1 tablet (20 mg total) by mouth daily with breakfast.   senna 8.6 MG Tabs tablet Commonly known as:  SENOKOT Take 1 tablet (8.6 mg total) by mouth daily.   sertraline 50 MG tablet Commonly known as:  ZOLOFT Take 50 mg by mouth daily.   tiotropium 18 MCG inhalation capsule Commonly  known as:  SPIRIVA Place 18 mcg into inhaler and inhale daily.   traZODone 50 MG tablet Commonly known as:  DESYREL Take 50 mg by mouth at bedtime as needed for sleep.   trimethoprim 100 MG tablet Commonly known as:  TRIMPEX Take 100 mg by mouth daily.       Review of Systems  Constitutional: Negative for activity change, appetite change, chills, diaphoresis, fatigue, fever and unexpected weight change (weight gain).  HENT: Negative for congestion, ear pain, mouth sores, nosebleeds, postnasal drip, rhinorrhea, sinus pain, sinus pressure, sneezing, sore throat, trouble swallowing and voice change.   Eyes: Negative.   Respiratory: Positive for cough. Negative for apnea, choking, chest tightness, shortness of breath and wheezing.   Cardiovascular: Negative for chest pain, palpitations and leg swelling.  Gastrointestinal: Negative for abdominal distention, abdominal pain, constipation, diarrhea and nausea.  Genitourinary: Negative for difficulty urinating, dysuria, frequency (chronic nocturia) and urgency.  Musculoskeletal: Positive for arthralgias (typical arthritis) and back pain (chronic). Negative for gait problem and myalgias.  Skin: Negative for color change, pallor, rash and  wound.  Neurological: Negative for dizziness, tremors, syncope, speech difficulty, weakness, numbness and headaches.  Psychiatric/Behavioral: Negative for agitation and behavioral problems.  All other systems reviewed and are negative.   Immunization History  Administered Date(s) Administered  . Influenza-Unspecified 02/02/2014   Pertinent  Health Maintenance Due  Topic Date Due  . DEXA SCAN  07/01/1992  . PNA vac Low Risk Adult (1 of 2 - PCV13) 07/01/1992  . INFLUENZA VACCINE  12/04/2015   No flowsheet data found. Functional Status Survey:    Vitals:   07/27/16 0130  Temp: 97.5 F (36.4 C)  Weight: 128 lb (58.1 kg)   Body mass index is 24.19 kg/m. Physical Exam  Constitutional: She is oriented to person, place, and time. Vital signs are normal. She appears well-developed and well-nourished. She is active and cooperative. She does not appear ill. No distress. Nasal cannula in place.  HENT:  Head: Normocephalic and atraumatic.  Mouth/Throat: Uvula is midline, oropharynx is clear and moist and mucous membranes are normal. Mucous membranes are not pale, not dry and not cyanotic.  Eyes: Conjunctivae, EOM and lids are normal. Pupils are equal, round, and reactive to light.  Neck: Trachea normal, normal range of motion and full passive range of motion without pain. Neck supple. No JVD present. No tracheal deviation, no edema and no erythema present. No thyromegaly present.  Cardiovascular: Normal rate, regular rhythm, normal heart sounds and intact distal pulses.  Exam reveals no gallop, no distant heart sounds and no friction rub.   No murmur heard. Pulses:      Dorsalis pedis pulses are 1+ on the right side, and 1+ on the left side.  3+ B-pitting edema. TED Hose in place  Pulmonary/Chest: Effort normal. No accessory muscle usage. No respiratory distress. She has no decreased breath sounds. She has wheezes (anterior. Clear posterior after cough) in the right upper field, the right  middle field and the right lower field. She has rhonchi (anterior. Clear posterior after cough) in the right upper field, the right middle field and the right lower field. She has no rales. She exhibits no tenderness.  Abdominal: Soft. Normal appearance and bowel sounds are normal. She exhibits no distension and no ascites. There is no tenderness.  Musculoskeletal: Normal range of motion. She exhibits no edema or tenderness.  Expected osteoarthritis, stiffness  Neurological: She is alert and oriented to person, place, and time. She has  normal strength.  Skin: Skin is warm, dry and intact. She is not diaphoretic. No cyanosis. No pallor. Nails show no clubbing.  Psychiatric: She has a normal mood and affect. Her speech is normal and behavior is normal. Judgment and thought content normal. Cognition and memory are normal.  Nursing note and vitals reviewed.   Labs reviewed:  Recent Labs  03/01/16 0450 06/04/16 2100 07/21/16 1500  NA 139 137 138  K 4.3 4.9 4.5  CL 103 105 102  CO2 31 26 27   GLUCOSE 112* 107* 202*  BUN 27* 19 18  CREATININE 1.03* 0.96 1.28*  CALCIUM 8.1* 8.2* 8.4*    Recent Labs  02/25/16 1243 06/04/16 2100 07/21/16 1500  AST 19 17 19   ALT 32 14 14  ALKPHOS 110 94 113  BILITOT 1.1 0.5 0.7  PROT 6.1* 5.1* 5.7*  ALBUMIN 3.6 3.2* 3.7    Recent Labs  03/06/16 0540 06/04/16 2100 07/21/16 1500  WBC 6.9 8.6 11.1*  NEUTROABS 5.6 7.3* 9.9*  HGB 8.8* 10.1* 11.2*  HCT 26.0* 30.2* 34.1*  MCV 86.7 88.6 90.8  PLT 244 169 207   Lab Results  Component Value Date   TSH 1.050 02/25/2016   Lab Results  Component Value Date   HGBA1C 5.9 (H) 02/25/2016   Lab Results  Component Value Date   CHOL 127 02/09/2015   HDL 72 02/09/2015   LDLCALC 49 02/09/2015   TRIG 28 02/09/2015   CHOLHDL 1.8 02/09/2015    Significant Diagnostic Results in last 30 days:  No results found.  Assessment/Plan 1. COPD exacerbation (HCC) (Chronic Bronchitis)  Tesselon Perles 200  mg po TID scheduled   DC Spiriva  Combivent Respimat 1 inhalation QID  Family/ staff Communication:   Total Time:  Documentation:  Face to Face:  Family/Phone:   Labs/tests ordered:  2 v CXR  Medication list reviewed and assessed for continued appropriateness.  Vikki Ports, NP-C Geriatrics [facility] [phone], [place] [address] Cell Phone (Mon-Fri 8am-5pm):  [phone] On Call:  [phone] & follow prompts after 5pm & weekends Office Phone:  [phone] Office Fax:  [phone]

## 2016-07-31 NOTE — Progress Notes (Signed)
This encounter was created in error - please disregard.

## 2016-08-03 ENCOUNTER — Encounter
Admission: RE | Admit: 2016-08-03 | Discharge: 2016-08-03 | Disposition: A | Discharge status: Home / Self Care | Payer: Medicare Other | Source: Ambulatory Visit | Attending: Internal Medicine | Admitting: Internal Medicine

## 2016-08-07 ENCOUNTER — Ambulatory Visit
Admission: RE | Admit: 2016-08-07 | Discharge: 2016-08-07 | Disposition: A | Discharge status: Home / Self Care | Payer: Medicare Other | Source: Ambulatory Visit | Attending: Gerontology | Admitting: Gerontology

## 2016-08-07 DIAGNOSIS — M549 Dorsalgia, unspecified: Secondary | ICD-10-CM

## 2016-08-07 DIAGNOSIS — M8448XA Pathological fracture, other site, initial encounter for fracture: Secondary | ICD-10-CM | POA: Insufficient documentation

## 2016-08-07 DIAGNOSIS — R29890 Loss of height: Secondary | ICD-10-CM | POA: Diagnosis not present

## 2016-08-07 DIAGNOSIS — M545 Low back pain: Secondary | ICD-10-CM | POA: Diagnosis not present

## 2016-08-13 DIAGNOSIS — M4854XA Collapsed vertebra, not elsewhere classified, thoracic region, initial encounter for fracture: Secondary | ICD-10-CM | POA: Diagnosis not present

## 2016-08-20 DIAGNOSIS — S22080A Wedge compression fracture of T11-T12 vertebra, initial encounter for closed fracture: Secondary | ICD-10-CM | POA: Diagnosis not present

## 2016-08-22 ENCOUNTER — Encounter
Admission: RE | Admit: 2016-08-22 | Discharge: 2016-08-22 | Disposition: A | Discharge status: Home / Self Care | Payer: Medicare Other | Source: Ambulatory Visit | Attending: Orthopedic Surgery | Admitting: Orthopedic Surgery

## 2016-08-22 DIAGNOSIS — Z01818 Encounter for other preprocedural examination: Secondary | ICD-10-CM | POA: Insufficient documentation

## 2016-08-22 HISTORY — DX: Essential (primary) hypertension: I10

## 2016-08-22 HISTORY — DX: Cardiac arrhythmia, unspecified: I49.9

## 2016-08-22 HISTORY — DX: Unspecified osteoarthritis, unspecified site: M19.90

## 2016-08-22 HISTORY — DX: Major depressive disorder, single episode, unspecified: F32.9

## 2016-08-22 HISTORY — DX: Panic disorder (episodic paroxysmal anxiety): F41.0

## 2016-08-22 HISTORY — DX: Depression, unspecified: F32.A

## 2016-08-22 HISTORY — DX: Gastro-esophageal reflux disease without esophagitis: K21.9

## 2016-08-22 LAB — POTASSIUM: POTASSIUM: 4.9 mmol/L (ref 3.5–5.1)

## 2016-08-22 NOTE — Patient Instructions (Signed)
  Your procedure is scheduled on:08/26/16 Report to Day Surgery. [facility]OND FLOOR To find out your arrival time please call [phone] between 1PM - 3PM on 08/25/16.  Remember: Instructions that are not followed completely may result in serious medical risk, up to and including death, or upon the discretion of your surgeon and anesthesiologist your surgery may need to be rescheduled.    __X__ 1. Do not eat food or drink liquids after midnight. No gum chewing or hard candies.     ____ 2. No Alcohol for 24 hours before or after surgery.   ____ 3. Do Not Smoke For 24 Hours Prior to Your Surgery.   ____ 4. Bring all medications with you on the day of surgery if instructed.    _X___ 5. Notify your doctor if there is any change in your medical condition     (cold, fever, infections).       Do not wear jewelry, make-up, hairpins, clips or nail polish.  Do not wear lotions, powders, or perfumes. You may wear deodorant.  Do not shave 48 hours prior to surgery. Men may shave face and neck.  Do not bring valuables to the hospital.    [facility] is not responsible for any belongings or valuables.               Contacts, dentures or bridgework may not be worn into surgery.  Leave your suitcase in the car. After surgery it may be brought to your room.  For patients admitted to the hospital, discharge time is determined by your                treatment team.   Patients discharged the day of surgery will not be allowed to drive home.   Please read over the following fact sheets that you were given:   Surgical Site Infection Prevention   _X___ Take these medicines the morning of surgery with A SIP OF WATER:    1.CLONAZEPAM IF USUALLY TAKES AM DOSE  2. DILTIAZEM IF USUALLY TAKES AM DOSE  3. ROBAXIN  4. OMEPRAZOLE  5. PREDNISONE  6. SERTRALINE IF TAKES AM DOSE  ____ Fleet Enema (as directed)   __X__ Use CHG Soap as directed  __X__ Use inhalers on the day of surgery   AND BRING  TO HOSPITAL ____ Stop metformin 2 days prior to surgery    ____ Take 1/2 of usual insulin dose the night before surgery and none on the morning of surgery.   __X__ Stop Coumadin/Plavix/aspirin on      STOP ELIQUIS 2 DAYS BEFORE SURGERY ____ Stop Anti-inflammatories on    __X_ Stop supplements until after surgery.  STOP MELATONIN UNTIL AFTER SURGERY  ____ Bring C-Pap to the hospital.

## 2016-08-26 ENCOUNTER — Ambulatory Visit: Payer: Medicare Other | Admitting: Certified Registered"

## 2016-08-26 ENCOUNTER — Ambulatory Visit
Admission: RE | Admit: 2016-08-26 | Discharge: 2016-08-26 | Disposition: A | Discharge status: Home / Self Care | Payer: Medicare Other | Source: Ambulatory Visit | Attending: Orthopedic Surgery | Admitting: Orthopedic Surgery

## 2016-08-26 ENCOUNTER — Ambulatory Visit: Payer: Medicare Other

## 2016-08-26 ENCOUNTER — Encounter: Payer: Self-pay | Admitting: *Deleted

## 2016-08-26 ENCOUNTER — Encounter
Admission: RE | Disposition: A | Discharge status: Home / Self Care | Payer: Self-pay | Source: Ambulatory Visit | Attending: Orthopedic Surgery

## 2016-08-26 DIAGNOSIS — Z87891 Personal history of nicotine dependence: Secondary | ICD-10-CM | POA: Diagnosis not present

## 2016-08-26 DIAGNOSIS — X58XXXA Exposure to other specified factors, initial encounter: Secondary | ICD-10-CM | POA: Insufficient documentation

## 2016-08-26 DIAGNOSIS — I739 Peripheral vascular disease, unspecified: Secondary | ICD-10-CM | POA: Insufficient documentation

## 2016-08-26 DIAGNOSIS — Z7901 Long term (current) use of anticoagulants: Secondary | ICD-10-CM | POA: Insufficient documentation

## 2016-08-26 DIAGNOSIS — I2510 Atherosclerotic heart disease of native coronary artery without angina pectoris: Secondary | ICD-10-CM | POA: Insufficient documentation

## 2016-08-26 DIAGNOSIS — M4856XA Collapsed vertebra, not elsewhere classified, lumbar region, initial encounter for fracture: Secondary | ICD-10-CM | POA: Insufficient documentation

## 2016-08-26 DIAGNOSIS — F329 Major depressive disorder, single episode, unspecified: Secondary | ICD-10-CM | POA: Diagnosis not present

## 2016-08-26 DIAGNOSIS — Z79899 Other long term (current) drug therapy: Secondary | ICD-10-CM | POA: Diagnosis not present

## 2016-08-26 DIAGNOSIS — F418 Other specified anxiety disorders: Secondary | ICD-10-CM | POA: Diagnosis not present

## 2016-08-26 DIAGNOSIS — I69354 Hemiplegia and hemiparesis following cerebral infarction affecting left non-dominant side: Secondary | ICD-10-CM | POA: Insufficient documentation

## 2016-08-26 DIAGNOSIS — Z419 Encounter for procedure for purposes other than remedying health state, unspecified: Secondary | ICD-10-CM

## 2016-08-26 DIAGNOSIS — Z7982 Long term (current) use of aspirin: Secondary | ICD-10-CM | POA: Insufficient documentation

## 2016-08-26 DIAGNOSIS — S22080A Wedge compression fracture of T11-T12 vertebra, initial encounter for closed fracture: Secondary | ICD-10-CM | POA: Diagnosis not present

## 2016-08-26 DIAGNOSIS — M4854XA Collapsed vertebra, not elsewhere classified, thoracic region, initial encounter for fracture: Secondary | ICD-10-CM | POA: Diagnosis not present

## 2016-08-26 DIAGNOSIS — J449 Chronic obstructive pulmonary disease, unspecified: Secondary | ICD-10-CM | POA: Insufficient documentation

## 2016-08-26 DIAGNOSIS — F419 Anxiety disorder, unspecified: Secondary | ICD-10-CM | POA: Insufficient documentation

## 2016-08-26 DIAGNOSIS — Z7951 Long term (current) use of inhaled steroids: Secondary | ICD-10-CM | POA: Insufficient documentation

## 2016-08-26 DIAGNOSIS — K219 Gastro-esophageal reflux disease without esophagitis: Secondary | ICD-10-CM | POA: Diagnosis not present

## 2016-08-26 DIAGNOSIS — S32030S Wedge compression fracture of third lumbar vertebra, sequela: Secondary | ICD-10-CM | POA: Diagnosis not present

## 2016-08-26 DIAGNOSIS — I10 Essential (primary) hypertension: Secondary | ICD-10-CM | POA: Diagnosis not present

## 2016-08-26 DIAGNOSIS — S22010A Wedge compression fracture of first thoracic vertebra, initial encounter for closed fracture: Secondary | ICD-10-CM | POA: Diagnosis not present

## 2016-08-26 DIAGNOSIS — S32010A Wedge compression fracture of first lumbar vertebra, initial encounter for closed fracture: Secondary | ICD-10-CM | POA: Diagnosis not present

## 2016-08-26 HISTORY — PX: KYPHOPLASTY: SHX5884

## 2016-08-26 SURGERY — KYPHOPLASTY
Anesthesia: General | Site: Thoracic | Wound class: Clean

## 2016-08-26 MED ORDER — PROPOFOL 10 MG/ML IV BOLUS
INTRAVENOUS | Status: DC | PRN
  Administered 2016-08-26 (×2): 20 mg via INTRAVENOUS

## 2016-08-26 MED ORDER — METOCLOPRAMIDE HCL 5 MG/ML IJ SOLN
5.0000 mg | Freq: Three times a day (TID) | INTRAMUSCULAR | Status: DC | PRN
Start: 2016-08-26 — End: 2016-08-26

## 2016-08-26 MED ORDER — PROPOFOL 500 MG/50ML IV EMUL
INTRAVENOUS | Status: DC | PRN
  Administered 2016-08-26: 25 ug/kg/min via INTRAVENOUS

## 2016-08-26 MED ORDER — CEFAZOLIN SODIUM-DEXTROSE 1-4 GM/50ML-% IV SOLN
INTRAVENOUS | Status: AC
  Filled 2016-08-26: qty 50

## 2016-08-26 MED ORDER — GLYCOPYRROLATE 0.2 MG/ML IJ SOLN
INTRAMUSCULAR | Status: AC
  Filled 2016-08-26: qty 1

## 2016-08-26 MED ORDER — ONDANSETRON HCL 4 MG PO TABS: 4.0000 mg | ORAL_TABLET | Freq: Four times a day (QID) | ORAL | Status: DC | PRN

## 2016-08-26 MED ORDER — LIDOCAINE HCL (PF) 1 % IJ SOLN
INTRAMUSCULAR | Status: AC
  Filled 2016-08-26: qty 60

## 2016-08-26 MED ORDER — SODIUM CHLORIDE 0.9 % IV SOLN: INTRAVENOUS | Status: DC

## 2016-08-26 MED ORDER — LIDOCAINE HCL 1 % IJ SOLN
INTRAMUSCULAR | Status: DC | PRN
  Administered 2016-08-26: 30 mL

## 2016-08-26 MED ORDER — LIDOCAINE HCL (PF) 2 % IJ SOLN
INTRAMUSCULAR | Status: AC
  Filled 2016-08-26: qty 2

## 2016-08-26 MED ORDER — HYDROCODONE-ACETAMINOPHEN 5-325 MG PO TABS: 1.0000 | ORAL_TABLET | ORAL | Status: DC | PRN

## 2016-08-26 MED ORDER — METOCLOPRAMIDE HCL 10 MG PO TABS
5.0000 mg | ORAL_TABLET | Freq: Three times a day (TID) | ORAL | Status: DC | PRN
Start: 2016-08-26 — End: 2016-08-26

## 2016-08-26 MED ORDER — FENTANYL CITRATE (PF) 100 MCG/2ML IJ SOLN
INTRAMUSCULAR | Status: AC
  Administered 2016-08-26: 25 ug via INTRAVENOUS
  Filled 2016-08-26: qty 2

## 2016-08-26 MED ORDER — BUPIVACAINE-EPINEPHRINE (PF) 0.5% -1:200000 IJ SOLN
INTRAMUSCULAR | Status: DC | PRN
  Administered 2016-08-26: 20 mL

## 2016-08-26 MED ORDER — FENTANYL CITRATE (PF) 100 MCG/2ML IJ SOLN
INTRAMUSCULAR | Status: DC | PRN
  Administered 2016-08-26: 10 ug via INTRAVENOUS

## 2016-08-26 MED ORDER — LIDOCAINE 2% (20 MG/ML) 5 ML SYRINGE
INTRAMUSCULAR | Status: DC | PRN
Start: 2016-08-26 — End: 2016-08-26
  Administered 2016-08-26: 50 mg via INTRAVENOUS

## 2016-08-26 MED ORDER — PROPOFOL 10 MG/ML IV BOLUS
INTRAVENOUS | Status: AC
  Filled 2016-08-26: qty 20

## 2016-08-26 MED ORDER — FENTANYL CITRATE (PF) 100 MCG/2ML IJ SOLN
INTRAMUSCULAR | Status: AC
  Filled 2016-08-26: qty 2

## 2016-08-26 MED ORDER — IOPAMIDOL (ISOVUE-M 200) INJECTION 41%
INTRAMUSCULAR | Status: AC
  Filled 2016-08-26: qty 20

## 2016-08-26 MED ORDER — ONDANSETRON HCL 4 MG/2ML IJ SOLN: 4.0000 mg | Freq: Four times a day (QID) | INTRAMUSCULAR | Status: DC | PRN

## 2016-08-26 MED ORDER — CEFAZOLIN SODIUM-DEXTROSE 1-4 GM/50ML-% IV SOLN
1.0000 g | Freq: Once | INTRAVENOUS | Status: AC
  Administered 2016-08-26: 1 g via INTRAVENOUS

## 2016-08-26 MED ORDER — SODIUM CHLORIDE 0.9 % IV SOLN
INTRAVENOUS | Status: DC
  Administered 2016-08-26: 12:00:00 via INTRAVENOUS

## 2016-08-26 MED ORDER — HYDROCODONE-ACETAMINOPHEN 5-325 MG PO TABS: 1.0000 | ORAL_TABLET | Freq: Four times a day (QID) | ORAL | 0 refills | Status: DC | PRN

## 2016-08-26 MED ORDER — KETAMINE HCL 50 MG/ML IJ SOLN
INTRAMUSCULAR | Status: DC | PRN
  Administered 2016-08-26: 10 mg via INTRAVENOUS
  Administered 2016-08-26: 25 mg via INTRAMUSCULAR
  Administered 2016-08-26: 15 mg via INTRAMUSCULAR

## 2016-08-26 MED ORDER — ONDANSETRON HCL 4 MG/2ML IJ SOLN: 4.0000 mg | Freq: Once | INTRAMUSCULAR | Status: DC | PRN

## 2016-08-26 MED ORDER — BUPIVACAINE-EPINEPHRINE (PF) 0.5% -1:200000 IJ SOLN
INTRAMUSCULAR | Status: AC
  Filled 2016-08-26: qty 30

## 2016-08-26 MED ORDER — FENTANYL CITRATE (PF) 100 MCG/2ML IJ SOLN
25.0000 ug | INTRAMUSCULAR | Status: DC | PRN
  Administered 2016-08-26 (×2): 25 ug via INTRAVENOUS

## 2016-08-26 MED ORDER — GLYCOPYRROLATE 0.2 MG/ML IJ SOLN
INTRAMUSCULAR | Status: DC | PRN
  Administered 2016-08-26: 0.1 mg via INTRAVENOUS

## 2016-08-26 SURGICAL SUPPLY — 16 items
CEMENT BONE KYPHON CDS (Cement) ×3 IMPLANT
DERMABOND ADVANCED (GAUZE/BANDAGES/DRESSINGS) ×2
DERMABOND ADVANCED .7 DNX12 (GAUZE/BANDAGES/DRESSINGS) ×1 IMPLANT
DEVICE BIOPSY BONE KYPHX (INSTRUMENTS) ×3 IMPLANT
DRAPE C-ARM XRAY 36X54 (DRAPES) ×3 IMPLANT
DURAPREP 26ML APPLICATOR (WOUND CARE) ×3 IMPLANT
GLOVE SURG SYN 9.0  PF PI (GLOVE) ×2
GLOVE SURG SYN 9.0 PF PI (GLOVE) ×1 IMPLANT
GOWN SRG 2XL LVL 4 RGLN SLV (GOWNS) ×1 IMPLANT
GOWN STRL NON-REIN 2XL LVL4 (GOWNS) ×2
GOWN STRL REUS W/ TWL LRG LVL3 (GOWN DISPOSABLE) ×1 IMPLANT
GOWN STRL REUS W/TWL LRG LVL3 (GOWN DISPOSABLE) ×2
PACK KYPHOPLASTY (MISCELLANEOUS) ×3 IMPLANT
STRAP SAFETY BODY (MISCELLANEOUS) ×3 IMPLANT
TRAY KYPHOPAK 15/3 EXPRESS 1ST (MISCELLANEOUS) IMPLANT
TRAY KYPHOPAK 20/3 EXPRESS 1ST (MISCELLANEOUS) ×3 IMPLANT

## 2016-08-26 NOTE — Progress Notes (Signed)
Report To Sylvester Harder RN.

## 2016-08-26 NOTE — Transfer of Care (Signed)
Immediate Anesthesia Transfer of Care Note  Patient: Ana Schultz  Procedure(s) Performed: Procedure(s): KYPHOPLASTY T12 (N/A)  Patient Location: PACU  Anesthesia Type:General  Level of Consciousness: awake  Airway & Oxygen Therapy: Patient Spontanous Breathing  Post-op Assessment: Report given to RN and Post -op Vital signs reviewed and stable  Post vital signs: Reviewed  Last Vitals:  Vitals:   08/26/16 1130 08/26/16 1421  BP: (!) 155/59 (!) 173/70  Pulse: 71 90  Resp: 20 13  Temp: 36.8 C     Last Pain:  Vitals:   08/26/16 1130  TempSrc: Oral  PainSc: 4          Complications: No apparent anesthesia complications

## 2016-08-26 NOTE — Anesthesia Postprocedure Evaluation (Signed)
Anesthesia Post Note  Patient: Ana Schultz  Procedure(s) Performed: Procedure(s) (LRB): KYPHOPLASTY T12 (N/A)  Patient location during evaluation: PACU Anesthesia Type: General Level of consciousness: awake and alert Pain management: pain level controlled Vital Signs Assessment: post-procedure vital signs reviewed and stable Respiratory status: spontaneous breathing and respiratory function stable Cardiovascular status: stable Anesthetic complications: no     Last Vitals:  Vitals:   08/26/16 1130 08/26/16 1421  BP: (!) 155/59 (!) 173/70  Pulse: 71 90  Resp: 20 13  Temp: 36.8 C 36.6 C    Last Pain:  Vitals:   08/26/16 1130  TempSrc: Oral  PainSc: 4                  Maruice Pieroni K

## 2016-08-26 NOTE — Anesthesia Post-op Follow-up Note (Cosign Needed)
Anesthesia QCDR form completed.        

## 2016-08-26 NOTE — Anesthesia Preprocedure Evaluation (Signed)
Anesthesia Evaluation  Patient identified by MRN, date of birth, ID band Patient awake    Reviewed: Allergy & Precautions, NPO status , Patient's Chart, lab work & pertinent test results  History of Anesthesia Complications Negative for: history of anesthetic complications  Airway Mallampati: III       Dental  (+) Lower Dentures, Upper Dentures   Pulmonary asthma , COPD,  COPD inhaler, former smoker,           Cardiovascular hypertension, Pt. on medications + CAD and + Peripheral Vascular Disease       Neuro/Psych Anxiety Depression CVA (left sided weakness), Residual Symptoms    GI/Hepatic Neg liver ROS, GERD  Medicated and Poorly Controlled,  Endo/Other  negative endocrine ROS  Renal/GU Renal disease (cyst)     Musculoskeletal  (+) Arthritis , Osteoarthritis,    Abdominal   Peds  Hematology  (+) anemia ,   Anesthesia Other Findings   Reproductive/Obstetrics                             Anesthesia Physical Anesthesia Plan  ASA: III  Anesthesia Plan: General   Post-op Pain Management:    Induction: Intravenous  Airway Management Planned: Nasal Cannula  Additional Equipment:   Intra-op Plan:   Post-operative Plan:   Informed Consent: I have reviewed the patients History and Physical, chart, labs and discussed the procedure including the risks, benefits and alternatives for the proposed anesthesia with the patient or authorized representative who has indicated his/her understanding and acceptance.     Plan Discussed with:   Anesthesia Plan Comments:         Anesthesia Quick Evaluation

## 2016-08-26 NOTE — H&P (Signed)
Reviewed paper H+P, will be scanned into chart. Patient examined No changes noted.  

## 2016-08-26 NOTE — Discharge Instructions (Addendum)
Take it easy today, resume normal activities tomorrow. Remove Band-Aid's on Thursday. okay to shower after that  [place]   1) The drugs that you were given will stay in your system until tomorrow so for the next 24 hours you should not:  A) Drive an automobile B) Make any legal decisions C) Drink any alcoholic beverage   2) You may resume regular meals tomorrow.  Today it is better to start with liquids and gradually work up to solid foods.  You may eat anything you prefer, but it is better to start with liquids, then soup and crackers, and gradually work up to solid foods.   3) Please notify your doctor immediately if you have any unusual bleeding, trouble breathing, redness and pain at the surgery site, drainage, fever, or pain not relieved by medication.    4) Additional Instructions:Stool softeners if taking narcotics/pain pills.   Follow physician instructions.    Please contact your physician with any problems or Same Day Surgery at [phone], Monday through Friday 6 am to 4 pm, or [place] at [facility] number at [phone].

## 2016-08-26 NOTE — OR Nursing (Signed)
1600 Report called to [place] at [facility].  Written instructions sent with patient.

## 2016-08-26 NOTE — Progress Notes (Signed)
Daughter at bedside, siderails up x2 and callbell Within reach.

## 2016-08-26 NOTE — Progress Notes (Signed)
Noted on MRI to have some signal changes and edema in the superior endplate from the compression fracture. With her very symptomatic in this level as well as the T12 level we discussed treating this level at the same time and will proceed with that

## 2016-08-26 NOTE — Op Note (Signed)
08/26/2016  2:20 PM  PATIENT:  Ana Schultz  83 y.o. female  PRE-OPERATIVE DIAGNOSIS:  wedge compression fracture of t12 vertebra, L3 compression fracture  POST-OPERATIVE DIAGNOSIS:  wedge compression fracture of t12 vertebra, L3 compression fracture   PROCEDURE:  Procedure(s): KYPHOPLASTY T12 (N/A) and L3 kyphoplasty  SURGEON: Laurene Footman, MD  ASSISTANTS: None  ANESTHESIA:   local and MAC  EBL:  Total I/O In: 500 [I.V.:500] Out: -   BLOOD ADMINISTERED:none  DRAINS: none   LOCAL MEDICATIONS USED:  MARCAINE     SPECIMEN:  Source of Specimen:  L3 vertebral body  DISPOSITION OF SPECIMEN:  PATHOLOGY  COUNTS:  YES  TOURNIQUET:  * No tourniquets in log *  IMPLANTS: Bone cement  DICTATION: .Dragon Dictation patient brought the operating room and after adequate sedation was given, the patient was placed prone. C-arm was brought in and good visualization of T12 and L3 was obtained. After patient identification and timeout procedures were completed, 10 cc 1% Xylocaine was infiltrated subcutaneously at the affected levels on the right side. After prepping and draping in usual sterile fashion and repeat timeout procedures carried out. Spinal he is then placed onto the pedicle on the right side at both levels and a 50-50 mix of 1% Xylocaine have percent Sensorcaine with epinephrine infiltrated. Small incision was then made first at T12 with a trocar advanced into the vertebral body care being taken on both AP and lateral projections to stay out of the neural foramen and spinal canal biopsy could not be obtained at T12 but a biopsy was obtained at L3. Balloons were inserted and inflated to 3 cc at T12 to have cc at L3. After mixing the cement appropriate consistency approximately 4 cc was infiltrated at T12 with good interdigitation and fill and 3 have cc at L3 with good fill on both sides without extravasation at either level. After the cement had set trochars removed and permanent  C-arm views obtained. Wounds are closed with Dermabond and covered with a Band-Aid  PLAN OF CARE: Discharge to home after PACU  PATIENT DISPOSITION:  PACU - hemodynamically stable.

## 2016-08-26 NOTE — Progress Notes (Signed)
Patient has had a stroke that has affected her left side.  Patient has multiple bruises on her legs and forearms.  Patient had to be lifted out of wheelchair and placed In bed by hospital staff.

## 2016-08-27 ENCOUNTER — Encounter: Payer: Self-pay | Admitting: Orthopedic Surgery

## 2016-08-28 ENCOUNTER — Non-Acute Institutional Stay (SKILLED_NURSING_FACILITY): Payer: Medicare Other | Admitting: Gerontology

## 2016-08-28 DIAGNOSIS — N183 Chronic kidney disease, stage 3 unspecified: Secondary | ICD-10-CM | POA: Insufficient documentation

## 2016-08-28 DIAGNOSIS — I351 Nonrheumatic aortic (valve) insufficiency: Secondary | ICD-10-CM | POA: Diagnosis not present

## 2016-08-28 DIAGNOSIS — G4700 Insomnia, unspecified: Secondary | ICD-10-CM | POA: Diagnosis not present

## 2016-08-28 DIAGNOSIS — R41841 Cognitive communication deficit: Secondary | ICD-10-CM

## 2016-08-28 DIAGNOSIS — J449 Chronic obstructive pulmonary disease, unspecified: Secondary | ICD-10-CM

## 2016-08-28 DIAGNOSIS — H04123 Dry eye syndrome of bilateral lacrimal glands: Secondary | ICD-10-CM | POA: Insufficient documentation

## 2016-08-28 DIAGNOSIS — I69391 Dysphagia following cerebral infarction: Secondary | ICD-10-CM

## 2016-08-28 DIAGNOSIS — I69354 Hemiplegia and hemiparesis following cerebral infarction affecting left non-dominant side: Secondary | ICD-10-CM | POA: Diagnosis not present

## 2016-08-28 DIAGNOSIS — M810 Age-related osteoporosis without current pathological fracture: Secondary | ICD-10-CM

## 2016-08-28 DIAGNOSIS — R1312 Dysphagia, oropharyngeal phase: Secondary | ICD-10-CM

## 2016-08-28 DIAGNOSIS — I712 Thoracic aortic aneurysm, without rupture: Secondary | ICD-10-CM

## 2016-08-28 DIAGNOSIS — Z87891 Personal history of nicotine dependence: Secondary | ICD-10-CM

## 2016-08-28 DIAGNOSIS — Z9181 History of falling: Secondary | ICD-10-CM | POA: Diagnosis not present

## 2016-08-28 DIAGNOSIS — F419 Anxiety disorder, unspecified: Secondary | ICD-10-CM | POA: Insufficient documentation

## 2016-08-28 DIAGNOSIS — I129 Hypertensive chronic kidney disease with stage 1 through stage 4 chronic kidney disease, or unspecified chronic kidney disease: Secondary | ICD-10-CM | POA: Diagnosis not present

## 2016-08-28 DIAGNOSIS — G8929 Other chronic pain: Secondary | ICD-10-CM

## 2016-08-28 DIAGNOSIS — F325 Major depressive disorder, single episode, in full remission: Secondary | ICD-10-CM

## 2016-08-28 DIAGNOSIS — I10 Essential (primary) hypertension: Secondary | ICD-10-CM

## 2016-08-28 DIAGNOSIS — D510 Vitamin B12 deficiency anemia due to intrinsic factor deficiency: Secondary | ICD-10-CM | POA: Insufficient documentation

## 2016-08-28 DIAGNOSIS — E785 Hyperlipidemia, unspecified: Secondary | ICD-10-CM | POA: Insufficient documentation

## 2016-08-28 DIAGNOSIS — I480 Paroxysmal atrial fibrillation: Secondary | ICD-10-CM

## 2016-08-28 DIAGNOSIS — K219 Gastro-esophageal reflux disease without esophagitis: Secondary | ICD-10-CM | POA: Insufficient documentation

## 2016-08-28 DIAGNOSIS — I7121 Aneurysm of the ascending aorta, without rupture: Secondary | ICD-10-CM

## 2016-08-28 DIAGNOSIS — J309 Allergic rhinitis, unspecified: Secondary | ICD-10-CM | POA: Insufficient documentation

## 2016-08-28 DIAGNOSIS — G510 Bell's palsy: Secondary | ICD-10-CM

## 2016-08-28 LAB — SURGICAL PATHOLOGY

## 2016-08-28 NOTE — Progress Notes (Signed)
Location:      Place of Service:  SNF (31) Provider:  Toni Arthurs, NP-C  DAVID Lovie Macadamia, MD  Patient Care Team: Juluis Pitch, MD as PCP - General (Family Medicine)  Extended Emergency Contact Information Primary Emergency Contact: [name] Address: [address]          [place], [place] [address] [name] of [place] Phone: [phone] Relation: Daughter Secondary Emergency Contact: Lingerfelt,Brad Address: [place], [place] [address] [name] of [place] Phone: [phone] Relation: [name]  Code Status:  Full Goals of care: Advanced Directive information Advanced Directives 08/22/2016  Does Patient Have a Medical Advance Directive? No  Type of Advance Directive -  Would patient like information on creating a medical advance directive? No - Patient declined     Chief Complaint  Patient presents with  . Medical Management of Chronic Issues    HPI:  Pt is a 83 y.o. female seen today for medical management of chronic diseases. Pt has multiple medical issues, many related to pain. Pt is on chronic PO steroids and inhalers for COPD and Bells Palsy. As a result, pt has had multiple wedge compression fractures of the lumbar vertebral spine. Most recently, pt underwent a 2 level Kyphoplasty 2 days ago. Pt reports she is sore, but her pain is now much better. She reports she feels weak and has no energy. Otherwise, she reports her appetite is good, having regular BMs, voiding without difficulty. She is mobile in the wheelchair. She does socialize with other residents consistently .She seems to enjoy working the puzzles. Pt has not had any fall this past month. She had several flares of COPD several months ago, but seems stable now. She is able to take a deep breath, cough now since the procedure-- likely unable to take deep breaths before d/t the pain, leading to atelectasis and bronchitis. Aside from the lack of energy, pt has no  complaints at this time. Pt denies n/v/d/f/c/cp/sob/ha/abd pain/dizziness/cough. VSS. No other complaints.     Past Medical History:  Diagnosis Date  . Adenoma of rectum   . Adrenal adenoma   . Anemia   . Arthritis   . Ascending aortic aneurysm ([place])    a. s/p repair 07/2014  . Atrophic vaginitis   . COPD (chronic obstructive pulmonary disease) ([place])   . Coronary artery disease, non-occlusive    a. LHC 06/2014 without evidence of obstructive disease  . Depression   . Diverticulosis   . Dysphagia   . Dysrhythmia   . Frequent UTI   . GERD (gastroesophageal reflux disease)   . Hemiplegia and hemiparesis ([place])    following cerebral infarction affecting left non-dominant side  . Hypertension   . Microscopic hematuria   . Nonrheumatic aortic valve insufficiency   . Osteoporosis   . Panic attacks   . Paroxysmal atrial fibrillation (HCC)   . Renal cyst    CKD STAGE 3  . Stroke [facility])    a. post-operative setting in 07/2014 leading to left UE hemiapresis and left LE weakness  . Urge incontinence    Past Surgical History:  Procedure Laterality Date  . ABDOMINAL HYSTERECTOMY    . AORTOILIAC BYPASS    . GASTROSTOMY W/ FEEDING TUBE    . INTRAMEDULLARY (IM) NAIL INTERTROCHANTERIC Left 02/26/2016   Procedure: INTRAMEDULLARY (IM) NAIL INTERTROCHANTRIC;  Surgeon: Earnestine Leys, MD;  Location: [facility] ORS;  Service: Orthopedics;  Laterality: Left;  .  KYPHOPLASTY N/A 08/26/2016   Procedure: KYPHOPLASTY T12;  Surgeon: Hessie Knows, MD;  Location: [facility] ORS;  Service: Orthopedics;  Laterality: N/A;    Allergies  Allergen Reactions  . Iodinated Diagnostic Agents Itching and Swelling  . Ciprofloxacin Other (See Comments)    Colitis  . Nitrofurantoin Diarrhea  . Solifenacin Other (See Comments)    indigestion  . Metronidazole Itching and Rash    Allergies as of 08/28/2016      Reactions   Iodinated Diagnostic Agents Itching, Swelling   Ciprofloxacin Other (See Comments)   Colitis    Nitrofurantoin Diarrhea   Solifenacin Other (See Comments)   indigestion   Metronidazole Itching, Rash      Medication List       Accurate as of 08/28/16 10:18 PM. Always use your most recent med list.          acetaminophen 325 MG tablet Commonly known as:  TYLENOL Take 650 mg by mouth every 4 (four) hours as needed for mild pain or fever.   albuterol 108 (90 Base) MCG/ACT inhaler Commonly known as:  PROVENTIL HFA;VENTOLIN HFA Inhale 2 puffs into the lungs every 4 (four) hours as needed for wheezing or shortness of breath.   artificial tears ointment Place 1 application into the left eye 4 (four) times daily.   atorvastatin 20 MG tablet Commonly known as:  LIPITOR Take 20 mg by mouth at bedtime.   benzonatate 100 MG capsule Commonly known as:  TESSALON Take 100 mg by mouth every 8 (eight) hours as needed for cough.   benzonatate 200 MG capsule Commonly known as:  TESSALON Take 200 mg by mouth 3 (three) times daily.   bisacodyl 10 MG suppository Commonly known as:  DULCOLAX Place 10 mg rectally daily as needed for moderate constipation.   BREO ELLIPTA 100-25 MCG/INH Aepb Generic drug:  fluticasone furoate-vilanterol Inhale 1 puff into the lungs daily.   Cholecalciferol 4000 units Caps Take 1 capsule by mouth daily.   clonazePAM 0.25 MG disintegrating tablet Commonly known as:  KLONOPIN Take 0.25 mg by mouth daily.   COMBIVENT RESPIMAT 20-100 MCG/ACT Aers respimat Generic drug:  Ipratropium-Albuterol Inhale 1 puff into the lungs every 6 (six) hours.   cyanocobalamin 1000 MCG tablet Take 1,000 mcg by mouth daily.   diclofenac sodium 1 % Gel Commonly known as:  VOLTAREN Apply 4 g topically 4 (four) times daily as needed (pain).   diltiazem 60 MG tablet Commonly known as:  CARDIZEM Take 60 mg by mouth daily.   docusate sodium 100 MG capsule Commonly known as:  COLACE Take 1 capsule (100 mg total) by mouth 2 (two) times daily.   ELIQUIS 2.5 MG Tabs  tablet Generic drug:  apixaban Take 2.5 mg by mouth 2 (two) times daily.   ferrous sulfate 325 (65 FE) MG tablet Take 1 tablet (325 mg total) by mouth 2 (two) times daily with a meal.   fluticasone 50 MCG/ACT nasal spray Commonly known as:  FLONASE Place 1 spray into both nostrils daily.   furosemide 40 MG tablet Commonly known as:  LASIX Take 40 mg by mouth daily.   HYDROcodone-acetaminophen 5-325 MG tablet Commonly known as:  NORCO Take 1-2 tablets by mouth every 6 (six) hours as needed for moderate pain.   magnesium hydroxide 400 MG/5ML suspension Commonly known as:  MILK OF MAGNESIA Take 30 mLs by mouth daily as needed for mild constipation.   Melatonin 3 MG Tabs Take 6 mg by mouth at bedtime.  meloxicam 7.5 MG tablet Commonly known as:  MOBIC Take 7.5 mg by mouth 2 (two) times daily.   methocarbamol 500 MG tablet Commonly known as:  ROBAXIN Take 250 mg by mouth 3 (three) times daily.   omeprazole 40 MG capsule Commonly known as:  PRILOSEC Take 40 mg by mouth daily.   oxyCODONE 5 MG immediate release tablet Commonly known as:  ROXICODONE Take 1 tablet (5 mg total) by mouth every 8 (eight) hours as needed for moderate pain or severe pain.   polyethylene glycol packet Commonly known as:  MIRALAX Take 17 g by mouth 2 (two) times daily.   polyvinyl alcohol-povidone 1.4-0.6 % ophthalmic solution Commonly known as:  HYPOTEARS Place 1 drop into both eyes 4 (four) times daily as needed (dry eyes).   potassium chloride SA 20 MEQ tablet Commonly known as:  K-DUR,KLOR-CON Take 20 mEq by mouth 2 (two) times daily.   predniSONE 20 MG tablet Commonly known as:  DELTASONE Take 1 tablet (20 mg total) by mouth daily with breakfast.   senna 8.6 MG Tabs tablet Commonly known as:  SENOKOT Take 1 tablet (8.6 mg total) by mouth daily.   sertraline 100 MG tablet Commonly known as:  ZOLOFT Take 100 mg by mouth daily.       Review of Systems  Constitutional: Negative  for activity change, appetite change, chills, diaphoresis, fatigue, fever and unexpected weight change (weight gain).  HENT: Negative for congestion, ear pain, mouth sores, nosebleeds, postnasal drip, rhinorrhea, sinus pain, sinus pressure, sneezing, sore throat, trouble swallowing and voice change.   Eyes: Negative.   Respiratory: Negative for apnea, cough, choking, chest tightness, shortness of breath and wheezing.   Cardiovascular: Negative for chest pain, palpitations and leg swelling.  Gastrointestinal: Negative for abdominal distention, abdominal pain, constipation, diarrhea and nausea.  Genitourinary: Negative for difficulty urinating, dysuria, frequency (chronic nocturia) and urgency.  Musculoskeletal: Positive for arthralgias (typical arthritis) and back pain (chronic). Negative for gait problem and myalgias.  Skin: Negative for color change, pallor, rash and wound.  Neurological: Negative for dizziness, tremors, syncope, speech difficulty, weakness, numbness and headaches.  Psychiatric/Behavioral: Negative for agitation and behavioral problems.  All other systems reviewed and are negative.   Immunization History  Administered Date(s) Administered  . Influenza-Unspecified 02/02/2014   Pertinent  Health Maintenance Due  Topic Date Due  . DEXA SCAN  07/01/1992  . PNA vac Low Risk Adult (1 of 2 - PCV13) 07/01/1992  . INFLUENZA VACCINE  12/03/2016   No flowsheet data found. Functional Status Survey:    Vitals:   08/19/16 0445  BP: (!) 156/61  Pulse: 72  Resp: 18  Temp: 97.5 F (36.4 C)  SpO2: 95%  Weight: 130 lb (59 kg)   Body mass index is 24.56 kg/m. Physical Exam  Constitutional: She is oriented to person, place, and time. Vital signs are normal. She appears well-developed and well-nourished. She is active and cooperative. She does not appear ill. No distress. Nasal cannula in place.  HENT:  Head: Normocephalic and atraumatic.  Mouth/Throat: Uvula is midline,  oropharynx is clear and moist and mucous membranes are normal. Mucous membranes are not pale, not dry and not cyanotic.  Eyes: Conjunctivae, EOM and lids are normal. Pupils are equal, round, and reactive to light.  Neck: Trachea normal, normal range of motion and full passive range of motion without pain. Neck supple. No JVD present. No tracheal deviation, no edema and no erythema present. No thyromegaly present.  Cardiovascular: Normal rate,  regular rhythm, normal heart sounds and intact distal pulses.  Exam reveals no gallop, no distant heart sounds and no friction rub.   No murmur heard. Pulses:      Dorsalis pedis pulses are 1+ on the right side, and 1+ on the left side.  3+ B-pitting edema. TED Hose in place  Pulmonary/Chest: Effort normal. No accessory muscle usage. No respiratory distress. She has no decreased breath sounds. She has no wheezes. She has no rhonchi. She has no rales. She exhibits no tenderness.  Abdominal: Soft. Normal appearance and bowel sounds are normal. She exhibits no distension and no ascites. There is no tenderness.  Musculoskeletal: Normal range of motion. She exhibits no edema or tenderness.  Expected osteoarthritis, stiffness  Neurological: She is alert and oriented to person, place, and time. She has normal strength.  Skin: Skin is warm, dry and intact. No rash noted. She is not diaphoretic. No cyanosis or erythema. No pallor. Nails show no clubbing.  Psychiatric: She has a normal mood and affect. Her speech is normal and behavior is normal. Judgment and thought content normal. Cognition and memory are normal.  Nursing note and vitals reviewed.   Labs reviewed:  Recent Labs  03/01/16 0450 06/04/16 2100 07/21/16 1500 08/22/16 1036  NA 139 137 138  --   K 4.3 4.9 4.5 4.9  CL 103 105 102  --   CO2 31 26 27   --   GLUCOSE 112* 107* 202*  --   BUN 27* 19 18  --   CREATININE 1.03* 0.96 1.28*  --   CALCIUM 8.1* 8.2* 8.4*  --     Recent Labs   02/25/16 1243 06/04/16 2100 07/21/16 1500  AST 19 17 19   ALT 32 14 14  ALKPHOS 110 94 113  BILITOT 1.1 0.5 0.7  PROT 6.1* 5.1* 5.7*  ALBUMIN 3.6 3.2* 3.7    Recent Labs  03/06/16 0540 06/04/16 2100 07/21/16 1500  WBC 6.9 8.6 11.1*  NEUTROABS 5.6 7.3* 9.9*  HGB 8.8* 10.1* 11.2*  HCT 26.0* 30.2* 34.1*  MCV 86.7 88.6 90.8  PLT 244 169 207   Lab Results  Component Value Date   TSH 1.050 02/25/2016   Lab Results  Component Value Date   HGBA1C 5.9 (H) 02/25/2016   Lab Results  Component Value Date   CHOL 127 02/09/2015   HDL 72 02/09/2015   LDLCALC 49 02/09/2015   TRIG 28 02/09/2015   CHOLHDL 1.8 02/09/2015    Significant Diagnostic Results in last 30 days:  Dg Thoracic Spine 2 View  Result Date: 08/26/2016 CLINICAL DATA:  Kyphoplasty. EXAM: THORACIC SPINE 2 VIEWS; DG C-ARM 61-120 MIN COMPARISON:  Chest x-ray 08/26/2016 . FINDINGS: Thoracic and lumbar compression fractures. Thoracic and lumbar kyphoplasties. Anatomic alignment. Two images obtained. 1 minutes 58 seconds fluoroscopy time. IMPRESSION: Prior thoracic and lumbar kyphoplasties. Electronically Signed   By: Marcello Moores  Register   On: 08/26/2016 14:41   Dg Lumbar Spine 2-3 Views  Result Date: 08/26/2016 CLINICAL DATA:  Imaging provided for kyphoplasty. EXAM: LUMBAR SPINE - 2-3 VIEW COMPARISON:  None. FINDINGS: Spot fluoro graphic views show kyphoplasty cement within the T12 and L3 vertebra. The kyphoplasty cement is contained within the vertebral bodies. There is non treated moderate compression fracture of L2. There is no evidence of a procedure complication. IMPRESSION: Imaging provided for T12 and L3 kyphoplasty. Electronically Signed   By: Lajean Manes M.D.   On: 08/26/2016 14:45   Mr Lumbar Spine Wo Contrast  Result  Date: 08/07/2016 CLINICAL DATA:  History of compression fractures. Persistent an worsening low back pain. EXAM: MRI LUMBAR SPINE WITHOUT CONTRAST TECHNIQUE: Multiplanar, multisequence MR imaging of  the lumbar spine was performed. No intravenous contrast was administered. COMPARISON:  04/17/2016 FINDINGS: Segmentation:  5 lumbar type vertebral bodies. Alignment:  Mild thoracolumbar kyphosis because of fractures. Vertebrae: Newly seen superior endplate fracture at U63 with loss of height of 10%. No retropulsed bone. Likely benign fracture. Previously seen L2 compression fracture with less edema than was demonstrated in December, indicating healing. Posterior bowing of the posterior aspect of the vertebral body without compressive stenosis. Superior endplate fracture at L3 with loss of height of 50%, slightly increased since the study of December, but with less edema indicating dated did progress somewhat but is healing. L4 is negative. Old minor superior endplate depression at L5 which is healed. Conus medullaris: Extends to the L1 level and appears normal. Paraspinal and other soft tissues: Normal except for simple appearing renal cysts. Disc levels: No disc herniation. No significant facet arthropathy. Mild narrowing of the canal to fracture levels of L2 and L3 but no compressive stenosis. IMPRESSION: Newly seen superior endplate fracture at F35. Loss of height of only 10%. This could be a cause of new pain. Previously seen fracture at L2 without progression. Less edema consistent with healing. Mild posterior bowing but without neural compression. Previously seen fracture at L3. This vertebra has lost slightly more height than at the time of previous imaging in December, but there is less edema present now, indicating that it did progress slightly but is in the process of healing. Mild posterior bowing but without neural compression. Old healed superior endplate fracture at L5. Electronically Signed   By: Nelson Chimes M.D.   On: 08/07/2016 14:00   Dg C-arm 1-60 Min  Result Date: 08/26/2016 CLINICAL DATA:  Kyphoplasty. EXAM: THORACIC SPINE 2 VIEWS; DG C-ARM 61-120 MIN COMPARISON:  Chest x-ray 08/26/2016 .  FINDINGS: Thoracic and lumbar compression fractures. Thoracic and lumbar kyphoplasties. Anatomic alignment. Two images obtained. 1 minutes 58 seconds fluoroscopy time. IMPRESSION: Prior thoracic and lumbar kyphoplasties. Electronically Signed   By: Marcello Moores  Register   On: 08/26/2016 14:41    Assessment/Plan 1. Hemiplegia and hemiparesis following cerebral infarction affecting left non-dominant side (HCC)  Stable  Hand splints   Mobile with wheelchair  2. Hx of falling  Stable   3. Dysphagia following cerebral infarction  Stable   4. Dysphagia, oropharyngeal  Stable   5. Cognitive communication deficit  Stable   6. Hypertensive chronic kidney disease w stg 1-4/unsp chr kdny  Stable  Renally adjust meds as appropriate  7. Chronic kidney disease, stage III (moderate)  Stable  Renally adjust meds as appropriate  8. Vitamin B12 deficiency anemia due to intrinsic factor deficiency  Stable  Continue Cyanocobalamin 1,000 mcg po Q Day  9. Personal history of nicotine dependence  Resolved   10. Anxiety disorder, unspecified type  Stable  Continue Clonazepam 0.25 mg po Q Day  11. Insomnia, unspecified type  Stable  Monitoring for needs   12. 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 Dry eye syndrome of both lacrimal glands  Stable  Artificial tears- ointment in the Left eye Q HS  Continue Hypotears- 1 drop in each eye QID  14. Allergic rhinitis, unspecified seasonality, unspecified trigger  Stable  Continue Fluticasone 50 mcg - 1 spray per nostril Q Day  15. Bell's palsy  Stable  Artificial tears- ointment in the Left eye Q HS  16. Nonrheumatic aortic valve insufficiency  Stable   17. Aneurysm, ascending aorta (HCC)  Stable   18. Benign essential HTN  Stable  Continue Furosemide 40 mg po Q Day  Continue Klor-Con 20 meq po BID  19. Paroxysmal atrial  fibrillation (HCC)  Stable  Continue Cardizem 60 mg po Q Day  Continue Eliquis 2.5 mg po BID  20. Chronic obstructive pulmonary disease, unspecified COPD type (HCC)  Stable  Continue Tessalon Perles 200 mg po TID scheduled for chronic bronchitis  Continue Tessalon Perles 100 mg po Q 8 hrs PRN  Continue Breo-Ellipta 100-25 mcg- 1 inhalation Q Day  Continue Combivent 20-200 QID  Proventil 90 mcg- 2 puffs Q  hours prn  21. Age related osteoporosis, unspecified pathological fracture presence  Not- stable  Recent multi-level Kyphoplasty for compression fractures  Cholecalciferol 4,000 units po Q Day  22. Other chronic pain  Stable  Continue Tylenol 650 mg po QID  Continue Oxycodone 5 mg po QID scheduled  Continue Oxycodone 5 mg po Q 4 hours prn  Continue Hydrocodone/APAP 5/325 mg 1-2 tablets po Q 4 hours prn #120, no refill (S/O Kyphoplasty)  Continue Methocarbamol 500 mg- 1/2 tablet po TID for spasms  Continue meloxicam 7.5 mg po Q 12 hours- severe pain  Voltaren gel 1%- apply 4 grams to the lumbar spine QID prn  23. Hyperlipidemia, unspecified hyperlipidemia type  Stable  Continue Atorvastatin 20 mg po Q HS  24. Addison anemia  Stable  Continue Ferrous Sulfate 325 mg po BID  25. Gastroesophageal Reflux disease without esophagitis  Stable  Continue Omeprazole 40 mg po Q Day  Family/ staff Communication:   Total Time:  Documentation:  Face to Face:  Family/Phone:   Labs/tests ordered:    Medication list reviewed and assessed for continued appropriateness. Monthly medication orders reviewed and signed.  Vikki Ports, NP-C Geriatrics [facility] [phone], [place] [address] Cell Phone (Mon-Fri 8am-5pm):  [phone] On Call:  [phone] & follow prompts after 5pm & weekends Office Phone:  [phone] Office Fax:  [phone]

## 2016-09-02 ENCOUNTER — Encounter
Admission: RE | Admit: 2016-09-02 | Discharge: 2016-09-02 | Disposition: A | Discharge status: Home / Self Care | Payer: Medicare Other | Source: Ambulatory Visit | Attending: Internal Medicine | Admitting: Internal Medicine

## 2016-09-02 DIAGNOSIS — I129 Hypertensive chronic kidney disease with stage 1 through stage 4 chronic kidney disease, or unspecified chronic kidney disease: Secondary | ICD-10-CM | POA: Insufficient documentation

## 2016-09-02 DIAGNOSIS — R601 Generalized edema: Secondary | ICD-10-CM | POA: Insufficient documentation

## 2016-09-02 DIAGNOSIS — M6281 Muscle weakness (generalized): Secondary | ICD-10-CM | POA: Diagnosis not present

## 2016-09-02 DIAGNOSIS — R293 Abnormal posture: Secondary | ICD-10-CM | POA: Diagnosis not present

## 2016-09-02 DIAGNOSIS — N189 Chronic kidney disease, unspecified: Secondary | ICD-10-CM | POA: Insufficient documentation

## 2016-09-03 DIAGNOSIS — M6281 Muscle weakness (generalized): Secondary | ICD-10-CM | POA: Diagnosis not present

## 2016-09-03 DIAGNOSIS — R293 Abnormal posture: Secondary | ICD-10-CM | POA: Diagnosis not present

## 2016-09-04 ENCOUNTER — Non-Acute Institutional Stay (SKILLED_NURSING_FACILITY): Payer: Medicare Other | Admitting: Gerontology

## 2016-09-04 DIAGNOSIS — R601 Generalized edema: Secondary | ICD-10-CM | POA: Diagnosis not present

## 2016-09-04 DIAGNOSIS — J181 Lobar pneumonia, unspecified organism: Secondary | ICD-10-CM

## 2016-09-04 DIAGNOSIS — J189 Pneumonia, unspecified organism: Secondary | ICD-10-CM

## 2016-09-04 DIAGNOSIS — I129 Hypertensive chronic kidney disease with stage 1 through stage 4 chronic kidney disease, or unspecified chronic kidney disease: Secondary | ICD-10-CM | POA: Diagnosis not present

## 2016-09-04 DIAGNOSIS — I351 Nonrheumatic aortic (valve) insufficiency: Secondary | ICD-10-CM | POA: Diagnosis not present

## 2016-09-04 DIAGNOSIS — N189 Chronic kidney disease, unspecified: Secondary | ICD-10-CM | POA: Diagnosis not present

## 2016-09-04 DIAGNOSIS — R600 Localized edema: Secondary | ICD-10-CM | POA: Diagnosis not present

## 2016-09-04 DIAGNOSIS — R609 Edema, unspecified: Secondary | ICD-10-CM | POA: Diagnosis not present

## 2016-09-04 LAB — COMPREHENSIVE METABOLIC PANEL
ALBUMIN: 3.8 g/dL (ref 3.5–5.0)
ALT: 15 U/L (ref 14–54)
AST: 18 U/L (ref 15–41)
Alkaline Phosphatase: 109 U/L (ref 38–126)
Anion gap: 6 (ref 5–15)
BUN: 25 mg/dL — AB (ref 6–20)
CALCIUM: 8.7 mg/dL — AB (ref 8.9–10.3)
CHLORIDE: 105 mmol/L (ref 101–111)
CO2: 29 mmol/L (ref 22–32)
CREATININE: 1.18 mg/dL — AB (ref 0.44–1.00)
GFR calc non Af Amer: 40 mL/min — ABNORMAL LOW (ref >60)
GFR, EST AFRICAN AMERICAN: 46 mL/min — AB (ref >60)
GLUCOSE: 106 mg/dL — AB (ref 65–99)
Potassium: 4.7 mmol/L (ref 3.5–5.1)
SODIUM: 140 mmol/L (ref 135–145)
Total Bilirubin: 0.8 mg/dL (ref 0.3–1.2)
Total Protein: 5.7 g/dL — ABNORMAL LOW (ref 6.5–8.1)

## 2016-09-04 LAB — CBC WITH DIFFERENTIAL/PLATELET
BASOS PCT: 0 %
Basophils Absolute: 0 10*3/uL (ref 0–0.1)
EOS ABS: 0 10*3/uL (ref 0–0.7)
Eosinophils Relative: 0 %
HEMATOCRIT: 28.1 % — AB (ref 35.0–47.0)
HEMOGLOBIN: 9.3 g/dL — AB (ref 12.0–16.0)
LYMPHS ABS: 0.8 10*3/uL — AB (ref 1.0–3.6)
Lymphocytes Relative: 10 %
MCH: 30 pg (ref 26.0–34.0)
MCHC: 33 g/dL (ref 32.0–36.0)
MCV: 90.7 fL (ref 80.0–100.0)
MONOS PCT: 6 %
Monocytes Absolute: 0.5 10*3/uL (ref 0.2–0.9)
NEUTROS ABS: 6.3 10*3/uL (ref 1.4–6.5)
NEUTROS PCT: 84 %
Platelets: 204 10*3/uL (ref 150–440)
RBC: 3.09 MIL/uL — AB (ref 3.80–5.20)
RDW: 17.3 % — ABNORMAL HIGH (ref 11.5–14.5)
WBC: 7.6 10*3/uL (ref 3.6–11.0)

## 2016-09-04 LAB — BRAIN NATRIURETIC PEPTIDE: B Natriuretic Peptide: 487 pg/mL — ABNORMAL HIGH (ref 0.0–100.0)

## 2016-09-04 LAB — MAGNESIUM: MAGNESIUM: 2.1 mg/dL (ref 1.7–2.4)

## 2016-09-04 LAB — TSH: TSH: 2.985 u[IU]/mL (ref 0.350–4.500)

## 2016-09-04 LAB — VITAMIN B12: VITAMIN B 12: 1198 pg/mL — AB (ref 180–914)

## 2016-09-05 LAB — VITAMIN D 25 HYDROXY (VIT D DEFICIENCY, FRACTURES): VIT D 25 HYDROXY: 44.2 ng/mL (ref 30.0–100.0)

## 2016-09-08 DIAGNOSIS — M6281 Muscle weakness (generalized): Secondary | ICD-10-CM | POA: Diagnosis not present

## 2016-09-08 DIAGNOSIS — R293 Abnormal posture: Secondary | ICD-10-CM | POA: Diagnosis not present

## 2016-09-09 DIAGNOSIS — M6281 Muscle weakness (generalized): Secondary | ICD-10-CM | POA: Diagnosis not present

## 2016-09-09 DIAGNOSIS — R293 Abnormal posture: Secondary | ICD-10-CM | POA: Diagnosis not present

## 2016-09-10 DIAGNOSIS — R293 Abnormal posture: Secondary | ICD-10-CM | POA: Diagnosis not present

## 2016-09-10 DIAGNOSIS — M6281 Muscle weakness (generalized): Secondary | ICD-10-CM | POA: Diagnosis not present

## 2016-09-11 DIAGNOSIS — R293 Abnormal posture: Secondary | ICD-10-CM | POA: Diagnosis not present

## 2016-09-11 DIAGNOSIS — M6281 Muscle weakness (generalized): Secondary | ICD-10-CM | POA: Diagnosis not present

## 2016-09-12 DIAGNOSIS — S22080A Wedge compression fracture of T11-T12 vertebra, initial encounter for closed fracture: Secondary | ICD-10-CM | POA: Diagnosis not present

## 2016-09-15 DIAGNOSIS — M6281 Muscle weakness (generalized): Secondary | ICD-10-CM | POA: Diagnosis not present

## 2016-09-15 DIAGNOSIS — R293 Abnormal posture: Secondary | ICD-10-CM | POA: Diagnosis not present

## 2016-09-16 DIAGNOSIS — R293 Abnormal posture: Secondary | ICD-10-CM | POA: Diagnosis not present

## 2016-09-16 DIAGNOSIS — M6281 Muscle weakness (generalized): Secondary | ICD-10-CM | POA: Diagnosis not present

## 2016-09-17 DIAGNOSIS — R293 Abnormal posture: Secondary | ICD-10-CM | POA: Diagnosis not present

## 2016-09-17 DIAGNOSIS — M6281 Muscle weakness (generalized): Secondary | ICD-10-CM | POA: Diagnosis not present

## 2016-09-17 NOTE — Progress Notes (Signed)
Location:      Place of Service:  SNF (31) Provider:  Toni Arthurs, NP-C  Juluis Pitch, MD  Patient Care Team: Juluis Pitch, MD as PCP - General [facility])  Extended Emergency Contact Information Primary Emergency Contact: [name] Address: [address]          [place], [place] [address] [name] of [place] Phone: [phone] Relation: Daughter Secondary Emergency Contact: Lingerfelt,Brad Address: [address] [place], [place] [address] [name] of [place] Phone: [phone] Relation: [name]  Code Status:  full Goals of care: Advanced Directive information Advanced Directives 08/22/2016  Does Patient Have a Medical Advance Directive? No  Type of Advance Directive -  Would patient like information on creating a medical advance directive? No - Patient declined     Chief Complaint  Patient presents with  . Acute Visit    HPI:  Pt is a 83 y.o. female seen today for an acute visit for cough, congestion. Pt has had a productive cough for 2 days. Staff noticed this morning significant edema/ anasarca in pt's whole body. Pt reports some difficulty breathing. Denies chest pain, but endorses some musculoskeletal pain in chest/ ribs. Cough is non-productive. Pt denies n/v/d/f/c/cp(cardiac)/ha/abd pain/dizziness, Pt endorses generalized, chronic pain- focused in the back. Recent sudden weight gain. Reports generalized weakness and malaise. Poor appetite. VSS. No other complaints.    Past Medical History:  Diagnosis Date  . Adenoma of rectum   . Adrenal adenoma   . Anemia   . Arthritis   . Ascending aortic aneurysm ([place])    a. s/p repair 07/2014  . Atrophic vaginitis   . COPD (chronic obstructive pulmonary disease) ([place])   . Coronary artery disease, non-occlusive    a. LHC 06/2014 without evidence of obstructive disease  . Depression   . Diverticulosis   . Dysphagia   . Dysrhythmia   . Frequent UTI   . GERD  (gastroesophageal reflux disease)   . Hemiplegia and hemiparesis ([place])    following cerebral infarction affecting left non-dominant side  . Hypertension   . Microscopic hematuria   . Nonrheumatic aortic valve insufficiency   . Osteoporosis   . Panic attacks   . Paroxysmal atrial fibrillation (HCC)   . Renal cyst    CKD STAGE 3  . Stroke [facility])    a. post-operative setting in 07/2014 leading to left UE hemiapresis and left LE weakness  . Urge incontinence    Past Surgical History:  Procedure Laterality Date  . ABDOMINAL HYSTERECTOMY    . AORTOILIAC BYPASS    . GASTROSTOMY W/ FEEDING TUBE    . INTRAMEDULLARY (IM) NAIL INTERTROCHANTERIC Left 02/26/2016   Procedure: INTRAMEDULLARY (IM) NAIL INTERTROCHANTRIC;  Surgeon: Earnestine Leys, MD;  Location: [facility] ORS;  Service: Orthopedics;  Laterality: Left;  . KYPHOPLASTY N/A 08/26/2016   Procedure: KYPHOPLASTY T12;  Surgeon: Hessie Knows, MD;  Location: [facility] ORS;  Service: Orthopedics;  Laterality: N/A;    Allergies  Allergen Reactions  . Iodinated Diagnostic Agents Itching and Swelling  . Ciprofloxacin Other (See Comments)    Colitis  . Nitrofurantoin Diarrhea  . Solifenacin Other (See Comments)    indigestion  . Metronidazole Itching and Rash    Allergies as of 09/04/2016      Reactions   Iodinated Diagnostic Agents Itching, Swelling   Ciprofloxacin Other (See Comments)   Colitis   Nitrofurantoin Diarrhea   Solifenacin Other (See Comments)  indigestion   Metronidazole Itching, Rash      Medication List       Accurate as of 09/04/16 11:59 PM. Always use your most recent med list.          acetaminophen 325 MG tablet Commonly known as:  TYLENOL Take 650 mg by mouth every 4 (four) hours as needed for mild pain or fever.   albuterol 108 (90 Base) MCG/ACT inhaler Commonly known as:  PROVENTIL HFA;VENTOLIN HFA Inhale 2 puffs into the lungs every 4 (four) hours as needed for wheezing or shortness of breath.   artificial tears  ointment Place 1 application into the left eye 4 (four) times daily.   atorvastatin 20 MG tablet Commonly known as:  LIPITOR Take 20 mg by mouth at bedtime.   benzonatate 100 MG capsule Commonly known as:  TESSALON Take 100 mg by mouth every 8 (eight) hours as needed for cough.   benzonatate 200 MG capsule Commonly known as:  TESSALON Take 200 mg by mouth 3 (three) times daily.   bisacodyl 10 MG suppository Commonly known as:  DULCOLAX Place 10 mg rectally daily as needed for moderate constipation.   BREO ELLIPTA 100-25 MCG/INH Aepb Generic drug:  fluticasone furoate-vilanterol Inhale 1 puff into the lungs daily.   Cholecalciferol 4000 units Caps Take 1 capsule by mouth daily.   clonazePAM 0.25 MG disintegrating tablet Commonly known as:  KLONOPIN Take 0.25 mg by mouth daily.   COMBIVENT RESPIMAT 20-100 MCG/ACT Aers respimat Generic drug:  Ipratropium-Albuterol Inhale 1 puff into the lungs every 6 (six) hours.   cyanocobalamin 1000 MCG tablet Take 1,000 mcg by mouth daily.   diclofenac sodium 1 % Gel Commonly known as:  VOLTAREN Apply 4 g topically 4 (four) times daily as needed (pain).   diltiazem 60 MG tablet Commonly known as:  CARDIZEM Take 60 mg by mouth daily.   docusate sodium 100 MG capsule Commonly known as:  COLACE Take 1 capsule (100 mg total) by mouth 2 (two) times daily.   ELIQUIS 2.5 MG Tabs tablet Generic drug:  apixaban Take 2.5 mg by mouth 2 (two) times daily.   ferrous sulfate 325 (65 FE) MG tablet Take 1 tablet (325 mg total) by mouth 2 (two) times daily with a meal.   fluticasone 50 MCG/ACT nasal spray Commonly known as:  FLONASE Place 1 spray into both nostrils daily.   furosemide 40 MG tablet Commonly known as:  LASIX Take 40 mg by mouth daily.   HYDROcodone-acetaminophen 5-325 MG tablet Commonly known as:  NORCO Take 1-2 tablets by mouth every 6 (six) hours as needed for moderate pain.   magnesium hydroxide 400 MG/5ML  suspension Commonly known as:  MILK OF MAGNESIA Take 30 mLs by mouth daily as needed for mild constipation.   Melatonin 3 MG Tabs Take 6 mg by mouth at bedtime.   meloxicam 7.5 MG tablet Commonly known as:  MOBIC Take 7.5 mg by mouth 2 (two) times daily.   methocarbamol 500 MG tablet Commonly known as:  ROBAXIN Take 250 mg by mouth 3 (three) times daily.   omeprazole 40 MG capsule Commonly known as:  PRILOSEC Take 40 mg by mouth daily.   oxyCODONE 5 MG immediate release tablet Commonly known as:  ROXICODONE Take 1 tablet (5 mg total) by mouth every 8 (eight) hours as needed for moderate pain or severe pain.   polyethylene glycol packet Commonly known as:  MIRALAX Take 17 g by mouth 2 (two) times daily.  polyvinyl alcohol-povidone 1.4-0.6 % ophthalmic solution Commonly known as:  HYPOTEARS Place 1 drop into both eyes 4 (four) times daily as needed (dry eyes).   potassium chloride SA 20 MEQ tablet Commonly known as:  K-DUR,KLOR-CON Take 20 mEq by mouth 2 (two) times daily.   predniSONE 20 MG tablet Commonly known as:  DELTASONE Take 1 tablet (20 mg total) by mouth daily with breakfast.   senna 8.6 MG Tabs tablet Commonly known as:  SENOKOT Take 1 tablet (8.6 mg total) by mouth daily.   sertraline 100 MG tablet Commonly known as:  ZOLOFT Take 100 mg by mouth daily.       Review of Systems  Constitutional: Positive for activity change, appetite change, fatigue and unexpected weight change (weight gain). Negative for chills, diaphoresis and fever.  HENT: Negative for congestion, ear pain, mouth sores, nosebleeds, postnasal drip, rhinorrhea, sinus pain, sinus pressure, sneezing, sore throat, trouble swallowing and voice change.   Eyes: Negative.   Respiratory: Positive for cough and shortness of breath. Negative for apnea, choking and wheezing.   Cardiovascular: Positive for leg swelling. Negative for chest pain and palpitations.  Gastrointestinal: Negative for  abdominal distention, abdominal pain, constipation, diarrhea and nausea.  Genitourinary: Negative for difficulty urinating, dysuria, frequency (chronic nocturia) and urgency.  Musculoskeletal: Positive for arthralgias (typical arthritis) and back pain (chronic). Negative for gait problem and myalgias.  Skin: Negative for color change, pallor, rash and wound.  Neurological: Positive for weakness. Negative for dizziness, tremors, syncope, speech difficulty, numbness and headaches.  Psychiatric/Behavioral: Negative for agitation and behavioral problems.  All other systems reviewed and are negative.   Immunization History  Administered Date(s) Administered  . Influenza-Unspecified 02/02/2014   Pertinent  Health Maintenance Due  Topic Date Due  . DEXA SCAN  07/01/1992  . PNA vac Low Risk Adult (1 of 2 - PCV13) 07/01/1992  . INFLUENZA VACCINE  12/03/2016   No flowsheet data found. Functional Status Survey:    Vitals:   09/02/16 0230  BP: (!) 153/44  Pulse: 69  Resp: 18  Temp: 97.5 F (36.4 C)  SpO2: 97%  Weight: 135 lb 12.8 oz (61.6 kg)   Body mass index is 24.84 kg/m. Physical Exam  Constitutional: She is oriented to person, place, and time. Vital signs are normal. She appears well-developed and well-nourished. She is active and cooperative. She appears ill. No distress. Nasal cannula in place.  HENT:  Head: Normocephalic and atraumatic.  Mouth/Throat: Uvula is midline, oropharynx is clear and moist and mucous membranes are normal. Mucous membranes are not pale, not dry and not cyanotic.  Eyes: Conjunctivae, EOM and lids are normal. Pupils are equal, round, and reactive to light.  Neck: Trachea normal, normal range of motion and full passive range of motion without pain. Neck supple. No JVD present. No tracheal deviation, no edema and no erythema present. No thyromegaly present.  Cardiovascular: Normal rate, regular rhythm, normal heart sounds and intact distal pulses.  Exam  reveals no gallop, no distant heart sounds and no friction rub.   No murmur heard. Pulses:      Dorsalis pedis pulses are 1+ on the right side, and 1+ on the left side.  Generalized anasarca  Pulmonary/Chest: No accessory muscle usage. She has decreased breath sounds in the right upper field, the right middle field and the right lower field. She has wheezes (anterior. Clear posterior after cough) in the left lower field. She has rhonchi (anterior. Clear posterior after cough) in the left lower  field. She has rales in the left lower field. She exhibits no tenderness.  Abdominal: Soft. Normal appearance and bowel sounds are normal. She exhibits no distension and no ascites. There is no tenderness.  Musculoskeletal: Normal range of motion. She exhibits no edema or tenderness.  Expected osteoarthritis, stiffness  Neurological: She is alert and oriented to person, place, and time. She has normal strength.  Skin: Skin is warm, dry and intact. She is not diaphoretic. No cyanosis. No pallor. Nails show no clubbing.  Psychiatric: She has a normal mood and affect. Her speech is normal and behavior is normal. Judgment and thought content normal. Cognition and memory are normal.  Nursing note and vitals reviewed.   Labs reviewed:  Recent Labs  06/04/16 2100 07/21/16 1500 08/22/16 1036 09/04/16 1700  NA 137 138  --  140  K 4.9 4.5 4.9 4.7  CL 105 102  --  105  CO2 26 27  --  29  GLUCOSE 107* 202*  --  106*  BUN 19 18  --  25*  CREATININE 0.96 1.28*  --  1.18*  CALCIUM 8.2* 8.4*  --  8.7*  MG  --   --   --  2.1    Recent Labs  06/04/16 2100 07/21/16 1500 09/04/16 1700  AST _0 ALT _1 ALKPHOS 94 113 109  BILITOT 0.5 0.7 0.8  PROT 5.1* 5.7* 5.7*  ALBUMIN 3.2* 3.7 3.8    Recent Labs  06/04/16 2100 07/21/16 1500 09/04/16 1700  WBC 8.6 11.1* 7.6  NEUTROABS 7.3* 9.9* 6.3  HGB 10.1* 11.2* 9.3*  HCT 30.2* 34.1* 28.1*  MCV 88.6 90.8 90.7  PLT 169 207 204   Lab Results   Component Value Date   TSH 2.985 09/04/2016   Lab Results  Component Value Date   HGBA1C 5.9 (H) 02/25/2016   Lab Results  Component Value Date   CHOL 127 02/09/2015   HDL 72 02/09/2015   LDLCALC 49 02/09/2015   TRIG 28 02/09/2015   CHOLHDL 1.8 02/09/2015    Significant Diagnostic Results in last 30 days:  Dg Thoracic Spine 2 View  Result Date: 08/26/2016 CLINICAL DATA:  Kyphoplasty. EXAM: THORACIC SPINE 2 VIEWS; DG C-ARM 61-120 MIN COMPARISON:  Chest x-ray 08/26/2016 . FINDINGS: Thoracic and lumbar compression fractures. Thoracic and lumbar kyphoplasties. Anatomic alignment. Two images obtained. 1 minutes 58 seconds fluoroscopy time. IMPRESSION: Prior thoracic and lumbar kyphoplasties. Electronically Signed   By: Marcello Moores  Register   On: 08/26/2016 14:41   Dg Lumbar Spine 2-3 Views  Result Date: 08/26/2016 CLINICAL DATA:  Imaging provided for kyphoplasty. EXAM: LUMBAR SPINE - 2-3 VIEW COMPARISON:  None. FINDINGS: Spot fluoro graphic views show kyphoplasty cement within the T12 and L3 vertebra. The kyphoplasty cement is contained within the vertebral bodies. There is non treated moderate compression fracture of L2. There is no evidence of a procedure complication. IMPRESSION: Imaging provided for T12 and L3 kyphoplasty. Electronically Signed   By: Lajean Manes M.D.   On: 08/26/2016 14:45   Dg C-arm 1-60 Min  Result Date: 08/26/2016 CLINICAL DATA:  Kyphoplasty. EXAM: THORACIC SPINE 2 VIEWS; DG C-ARM 61-120 MIN COMPARISON:  Chest x-ray 08/26/2016 . FINDINGS: Thoracic and lumbar compression fractures. Thoracic and lumbar kyphoplasties. Anatomic alignment. Two images obtained. 1 minutes 58 seconds fluoroscopy time. IMPRESSION: Prior thoracic and lumbar kyphoplasties. Electronically Signed   By: Marcello Moores  Register   On: 08/26/2016 14:41    Assessment/Plan 1. Pneumonia of left lower  lobe due to infectious organism (HCC)  Rocephin 2 grams IM x 1 now- reconstitute with Lidocaine  Augmentin  875-125 mg 1 tablet po Q 12 hours x 7 days  Pulmicort 0.5 mg/2 mL BID x 2 days  O2 2L [place] for pulmonary support  2. Anasarca  Lasix 40 mg IM x 1 now  Family/ staff Communication:   Total Time:  Documentation:  Face to Face:  Family/Phone:   Labs/tests ordered:  2-V CXR, 2-D Echo, CBC, Met C, BNP, Vit D, B12, TSH, Mag+  Medication list reviewed and assessed for continued appropriateness.  Vikki Ports, NP-C Geriatrics [facility] [phone], [place] [address] Cell Phone (Mon-Fri 8am-5pm):  [phone] On Call:  [phone] & follow prompts after 5pm & weekends Office Phone:  [phone] Office Fax:  [phone]

## 2016-09-18 DIAGNOSIS — M6281 Muscle weakness (generalized): Secondary | ICD-10-CM | POA: Diagnosis not present

## 2016-09-18 DIAGNOSIS — R293 Abnormal posture: Secondary | ICD-10-CM | POA: Diagnosis not present

## 2016-09-19 DIAGNOSIS — M6281 Muscle weakness (generalized): Secondary | ICD-10-CM | POA: Diagnosis not present

## 2016-09-19 DIAGNOSIS — R293 Abnormal posture: Secondary | ICD-10-CM | POA: Diagnosis not present

## 2016-09-22 DIAGNOSIS — M6281 Muscle weakness (generalized): Secondary | ICD-10-CM | POA: Diagnosis not present

## 2016-09-22 DIAGNOSIS — R293 Abnormal posture: Secondary | ICD-10-CM | POA: Diagnosis not present

## 2016-09-23 DIAGNOSIS — G4700 Insomnia, unspecified: Secondary | ICD-10-CM | POA: Diagnosis not present

## 2016-09-23 DIAGNOSIS — R293 Abnormal posture: Secondary | ICD-10-CM | POA: Diagnosis not present

## 2016-09-23 DIAGNOSIS — F419 Anxiety disorder, unspecified: Secondary | ICD-10-CM | POA: Diagnosis not present

## 2016-09-23 DIAGNOSIS — F339 Major depressive disorder, recurrent, unspecified: Secondary | ICD-10-CM | POA: Diagnosis not present

## 2016-09-23 DIAGNOSIS — M6281 Muscle weakness (generalized): Secondary | ICD-10-CM | POA: Diagnosis not present

## 2016-09-24 DIAGNOSIS — M6281 Muscle weakness (generalized): Secondary | ICD-10-CM | POA: Diagnosis not present

## 2016-09-24 DIAGNOSIS — R293 Abnormal posture: Secondary | ICD-10-CM | POA: Diagnosis not present

## 2016-09-25 DIAGNOSIS — M6281 Muscle weakness (generalized): Secondary | ICD-10-CM | POA: Diagnosis not present

## 2016-09-25 DIAGNOSIS — R293 Abnormal posture: Secondary | ICD-10-CM | POA: Diagnosis not present

## 2016-09-30 DIAGNOSIS — M6281 Muscle weakness (generalized): Secondary | ICD-10-CM | POA: Diagnosis not present

## 2016-09-30 DIAGNOSIS — R293 Abnormal posture: Secondary | ICD-10-CM | POA: Diagnosis not present

## 2016-10-01 ENCOUNTER — Non-Acute Institutional Stay (SKILLED_NURSING_FACILITY): Payer: Medicare Other | Admitting: Gerontology

## 2016-10-01 DIAGNOSIS — F419 Anxiety disorder, unspecified: Secondary | ICD-10-CM

## 2016-10-01 DIAGNOSIS — G4700 Insomnia, unspecified: Secondary | ICD-10-CM

## 2016-10-01 DIAGNOSIS — H04123 Dry eye syndrome of bilateral lacrimal glands: Secondary | ICD-10-CM

## 2016-10-01 DIAGNOSIS — D510 Vitamin B12 deficiency anemia due to intrinsic factor deficiency: Secondary | ICD-10-CM

## 2016-10-01 DIAGNOSIS — Z87891 Personal history of nicotine dependence: Secondary | ICD-10-CM | POA: Diagnosis not present

## 2016-10-01 DIAGNOSIS — I69391 Dysphagia following cerebral infarction: Secondary | ICD-10-CM

## 2016-10-01 DIAGNOSIS — G510 Bell's palsy: Secondary | ICD-10-CM

## 2016-10-01 DIAGNOSIS — G8929 Other chronic pain: Secondary | ICD-10-CM

## 2016-10-01 DIAGNOSIS — R41841 Cognitive communication deficit: Secondary | ICD-10-CM

## 2016-10-01 DIAGNOSIS — I129 Hypertensive chronic kidney disease with stage 1 through stage 4 chronic kidney disease, or unspecified chronic kidney disease: Secondary | ICD-10-CM

## 2016-10-01 DIAGNOSIS — I69354 Hemiplegia and hemiparesis following cerebral infarction affecting left non-dominant side: Secondary | ICD-10-CM | POA: Diagnosis not present

## 2016-10-01 DIAGNOSIS — Z9181 History of falling: Secondary | ICD-10-CM

## 2016-10-01 DIAGNOSIS — F325 Major depressive disorder, single episode, in full remission: Secondary | ICD-10-CM

## 2016-10-01 DIAGNOSIS — I7121 Aneurysm of the ascending aorta, without rupture: Secondary | ICD-10-CM

## 2016-10-01 DIAGNOSIS — J449 Chronic obstructive pulmonary disease, unspecified: Secondary | ICD-10-CM

## 2016-10-01 DIAGNOSIS — N183 Chronic kidney disease, stage 3 unspecified: Secondary | ICD-10-CM

## 2016-10-01 DIAGNOSIS — J309 Allergic rhinitis, unspecified: Secondary | ICD-10-CM

## 2016-10-01 DIAGNOSIS — I10 Essential (primary) hypertension: Secondary | ICD-10-CM

## 2016-10-01 DIAGNOSIS — R1312 Dysphagia, oropharyngeal phase: Secondary | ICD-10-CM

## 2016-10-01 DIAGNOSIS — I712 Thoracic aortic aneurysm, without rupture: Secondary | ICD-10-CM

## 2016-10-01 DIAGNOSIS — I480 Paroxysmal atrial fibrillation: Secondary | ICD-10-CM

## 2016-10-01 DIAGNOSIS — M810 Age-related osteoporosis without current pathological fracture: Secondary | ICD-10-CM

## 2016-10-01 DIAGNOSIS — E785 Hyperlipidemia, unspecified: Secondary | ICD-10-CM

## 2016-10-01 DIAGNOSIS — I351 Nonrheumatic aortic (valve) insufficiency: Secondary | ICD-10-CM

## 2016-10-01 DIAGNOSIS — K219 Gastro-esophageal reflux disease without esophagitis: Secondary | ICD-10-CM

## 2016-10-01 NOTE — Progress Notes (Signed)
Location:      Place of Service:  SNF (31) Provider:  Toni Arthurs, NP-C  Juluis Pitch, MD  Patient Care Team: Juluis Pitch, MD as PCP - General [facility])  Extended Emergency Contact Information Primary Emergency Contact: [name] Address: [address]          [place], [place] [address] [name] of [place] Phone: [phone] Relation: Daughter Secondary Emergency Contact: Lingerfelt,Brad Address: [address] [place], [place] [address] [name] of [place] Phone: [phone] Relation: [name]  Code Status:  Full Goals of care: Advanced Directive information Advanced Directives 08/22/2016  Does Patient Have a Medical Advance Directive? No  Type of Advance Directive -  Would patient like information on creating a medical advance directive? No - Patient declined     Chief Complaint  Patient presents with  . Medical Management of Chronic Issues    HPI:  Pt is a 83 y.o. female seen today for medical management of chronic diseases. Pt has multiple medical issues, many related to pain. Pt is on chronic PO steroids and inhalers for COPD and Bells Palsy. As a result, pt has had multiple wedge compression fractures of the lumbar vertebral spine. Most recently, pt underwent a 2 level Kyphoplasty 2 days ago. Pt initially reports she is sore, but her pain is improved. Now, she is saying her pain is bad again. Says she is in severe pain all the time. She reports she feels weak and has no energy. Otherwise, she reports her appetite is good, having regular BMs, voiding without difficulty. She is mobile in the wheelchair. She does socialize with other residents consistently .She seems to enjoy working the puzzles. Pt has not had any falls this past month. She had several flares of COPD several months ago, that has resolved. However, pt has audible wheezes today. She is not able to take a deep breath, cough now due to the ongoing pain.  Aside  from the lack of energy and chronic pain, pt has no complaints at this time. Pt denies n/v/d/f/c/cp/sob/ha/abd pain/dizziness/cough. VSS. No other complaints.     Past Medical History:  Diagnosis Date  . Adenoma of rectum   . Adrenal adenoma   . Anemia   . Arthritis   . Ascending aortic aneurysm ([place])    a. s/p repair 07/2014  . Atrophic vaginitis   . COPD (chronic obstructive pulmonary disease) ([place])   . Coronary artery disease, non-occlusive    a. LHC 06/2014 without evidence of obstructive disease  . Depression   . Diverticulosis   . Dysphagia   . Dysrhythmia   . Frequent UTI   . GERD (gastroesophageal reflux disease)   . Hemiplegia and hemiparesis ([place])    following cerebral infarction affecting left non-dominant side  . Hypertension   . Microscopic hematuria   . Nonrheumatic aortic valve insufficiency   . Osteoporosis   . Panic attacks   . Paroxysmal atrial fibrillation (HCC)   . Renal cyst    CKD STAGE 3  . Stroke [facility])    a. post-operative setting in 07/2014 leading to left UE hemiapresis and left LE weakness  . Urge incontinence    Past Surgical History:  Procedure Laterality Date  . ABDOMINAL HYSTERECTOMY    . AORTOILIAC BYPASS    . GASTROSTOMY W/ FEEDING TUBE    . INTRAMEDULLARY (IM) NAIL INTERTROCHANTERIC Left 02/26/2016   Procedure: INTRAMEDULLARY (IM) NAIL INTERTROCHANTRIC;  Surgeon: Nadara Mustard  Sabra Heck, MD;  Location: [facility] ORS;  Service: Orthopedics;  Laterality: Left;  . KYPHOPLASTY N/A 08/26/2016   Procedure: KYPHOPLASTY T12;  Surgeon: Hessie Knows, MD;  Location: [facility] ORS;  Service: Orthopedics;  Laterality: N/A;    Allergies  Allergen Reactions  . Iodinated Diagnostic Agents Itching and Swelling  . Ciprofloxacin Other (See Comments)    Colitis  . Nitrofurantoin Diarrhea  . Solifenacin Other (See Comments)    indigestion  . Metronidazole Itching and Rash    Allergies as of 10/01/2016      Reactions   Iodinated Diagnostic Agents Itching, Swelling    Ciprofloxacin Other (See Comments)   Colitis   Nitrofurantoin Diarrhea   Solifenacin Other (See Comments)   indigestion   Metronidazole Itching, Rash      Medication List       Accurate as of 10/01/16  9:18 PM. Always use your most recent med list.          acetaminophen 325 MG tablet Commonly known as:  TYLENOL Take 650 mg by mouth every 4 (four) hours as needed for mild pain or fever.   albuterol 108 (90 Base) MCG/ACT inhaler Commonly known as:  PROVENTIL HFA;VENTOLIN HFA Inhale 2 puffs into the lungs every 4 (four) hours as needed for wheezing or shortness of breath.   artificial tears ointment Place 1 application into the left eye 4 (four) times daily.   atorvastatin 20 MG tablet Commonly known as:  LIPITOR Take 20 mg by mouth at bedtime.   benzonatate 100 MG capsule Commonly known as:  TESSALON Take 100 mg by mouth every 8 (eight) hours as needed for cough.   benzonatate 200 MG capsule Commonly known as:  TESSALON Take 200 mg by mouth 3 (three) times daily.   bisacodyl 10 MG suppository Commonly known as:  DULCOLAX Place 10 mg rectally daily as needed for moderate constipation.   BREO ELLIPTA 100-25 MCG/INH Aepb Generic drug:  fluticasone furoate-vilanterol Inhale 1 puff into the lungs daily.   Cholecalciferol 4000 units Caps Take 1 capsule by mouth daily.   clonazePAM 0.25 MG disintegrating tablet Commonly known as:  KLONOPIN Take 0.25 mg by mouth daily.   COMBIVENT RESPIMAT 20-100 MCG/ACT Aers respimat Generic drug:  Ipratropium-Albuterol Inhale 1 puff into the lungs every 6 (six) hours.   cyanocobalamin 1000 MCG tablet Take 1,000 mcg by mouth daily.   diclofenac sodium 1 % Gel Commonly known as:  VOLTAREN Apply 4 g topically 4 (four) times daily as needed (pain).   diltiazem 60 MG tablet Commonly known as:  CARDIZEM Take 60 mg by mouth daily.   docusate sodium 100 MG capsule Commonly known as:  COLACE Take 1 capsule (100 mg total) by mouth  2 (two) times daily.   ELIQUIS 2.5 MG Tabs tablet Generic drug:  apixaban Take 2.5 mg by mouth 2 (two) times daily.   ferrous sulfate 325 (65 FE) MG tablet Take 1 tablet (325 mg total) by mouth 2 (two) times daily with a meal.   fluticasone 50 MCG/ACT nasal spray Commonly known as:  FLONASE Place 1 spray into both nostrils daily.   furosemide 40 MG tablet Commonly known as:  LASIX Take 40 mg by mouth daily.   HYDROcodone-acetaminophen 5-325 MG tablet Commonly known as:  NORCO Take 1-2 tablets by mouth every 6 (six) hours as needed for moderate pain.   magnesium hydroxide 400 MG/5ML suspension Commonly known as:  MILK OF MAGNESIA Take 30 mLs by mouth daily as needed for mild  constipation.   Melatonin 3 MG Tabs Take 6 mg by mouth at bedtime.   meloxicam 7.5 MG tablet Commonly known as:  MOBIC Take 7.5 mg by mouth 2 (two) times daily.   methocarbamol 500 MG tablet Commonly known as:  ROBAXIN Take 250 mg by mouth 3 (three) times daily.   omeprazole 40 MG capsule Commonly known as:  PRILOSEC Take 40 mg by mouth daily.   oxyCODONE 5 MG immediate release tablet Commonly known as:  ROXICODONE Take 1 tablet (5 mg total) by mouth every 8 (eight) hours as needed for moderate pain or severe pain.   polyethylene glycol packet Commonly known as:  MIRALAX Take 17 g by mouth 2 (two) times daily.   polyvinyl alcohol-povidone 1.4-0.6 % ophthalmic solution Commonly known as:  HYPOTEARS Place 1 drop into both eyes 4 (four) times daily as needed (dry eyes).   potassium chloride SA 20 MEQ tablet Commonly known as:  K-DUR,KLOR-CON Take 20 mEq by mouth 2 (two) times daily.   predniSONE 20 MG tablet Commonly known as:  DELTASONE Take 1 tablet (20 mg total) by mouth daily with breakfast.   senna 8.6 MG Tabs tablet Commonly known as:  SENOKOT Take 1 tablet (8.6 mg total) by mouth daily.   sertraline 100 MG tablet Commonly known as:  ZOLOFT Take 100 mg by mouth daily.        Review of Systems  Constitutional: Negative for activity change, appetite change, chills, diaphoresis, fatigue, fever and unexpected weight change (weight gain).  HENT: Negative for congestion, ear pain, mouth sores, nosebleeds, postnasal drip, rhinorrhea, sinus pain, sinus pressure, sneezing, sore throat, trouble swallowing and voice change.   Eyes: Negative.   Respiratory: Positive for wheezing. Negative for apnea, cough, choking, chest tightness and shortness of breath.   Cardiovascular: Negative for chest pain, palpitations and leg swelling.  Gastrointestinal: Negative for abdominal distention, abdominal pain, constipation, diarrhea and nausea.  Genitourinary: Negative for difficulty urinating, dysuria, frequency (chronic nocturia) and urgency.  Musculoskeletal: Positive for arthralgias (typical arthritis) and back pain (chronic). Negative for gait problem and myalgias.  Skin: Negative for color change, pallor, rash and wound.  Neurological: Negative for dizziness, tremors, syncope, speech difficulty, weakness, numbness and headaches.  Psychiatric/Behavioral: Negative for agitation and behavioral problems.  All other systems reviewed and are negative.   Immunization History  Administered Date(s) Administered  . Influenza-Unspecified 02/02/2014   Pertinent  Health Maintenance Due  Topic Date Due  . DEXA SCAN  07/01/1992  . PNA vac Low Risk Adult (1 of 2 - PCV13) 07/01/1992  . INFLUENZA VACCINE  12/03/2016   No flowsheet data found. Functional Status Survey:    Vitals:   10/01/16 0315  BP: (!) 183/58  Pulse: 74  Resp: 20  Temp: 98 F (36.7 C)  SpO2: 98%  Weight: 125 lb 1.6 oz (56.7 kg)   Body mass index is 22.88 kg/m. Physical Exam  Constitutional: She is oriented to person, place, and time. Vital signs are normal. She appears well-developed and well-nourished. She is active and cooperative. She does not appear ill. No distress. Nasal cannula in place.  HENT:   Head: Normocephalic and atraumatic.  Mouth/Throat: Uvula is midline, oropharynx is clear and moist and mucous membranes are normal. Mucous membranes are not pale, not dry and not cyanotic.  Eyes: Conjunctivae, EOM and lids are normal. Pupils are equal, round, and reactive to light.  Neck: Trachea normal, normal range of motion and full passive range of motion without pain. Neck  supple. No JVD present. No tracheal deviation, no edema and no erythema present. No thyromegaly present.  Cardiovascular: Normal rate, regular rhythm, normal heart sounds and intact distal pulses.  Exam reveals no gallop, no distant heart sounds and no friction rub.   No murmur heard. Pulses:      Dorsalis pedis pulses are 1+ on the right side, and 1+ on the left side.  3+ B-pitting edema. TED Hose in place  Pulmonary/Chest: Effort normal. No accessory muscle usage. No respiratory distress. She has no decreased breath sounds. She has wheezes in the right middle field, the right lower field, the left middle field and the left lower field. She has no rhonchi. She has no rales. She exhibits no tenderness.  Abdominal: Soft. Normal appearance and bowel sounds are normal. She exhibits no distension and no ascites. There is no tenderness.  Musculoskeletal: Normal range of motion. She exhibits no edema or tenderness.  Expected osteoarthritis, stiffness  Neurological: She is alert and oriented to person, place, and time. She has normal strength.  Skin: Skin is warm, dry and intact. No rash noted. She is not diaphoretic. No cyanosis or erythema. No pallor. Nails show no clubbing.  Psychiatric: She has a normal mood and affect. Her speech is normal and behavior is normal. Judgment and thought content normal. Cognition and memory are normal.  Nursing note and vitals reviewed.   Labs reviewed:  Recent Labs  06/04/16 2100 07/21/16 1500 08/22/16 1036 09/04/16 1700  NA 137 138  --  140  K 4.9 4.5 4.9 4.7  CL 105 102  --  105   CO2 26 27  --  29  GLUCOSE 107* 202*  --  106*  BUN 19 18  --  25*  CREATININE 0.96 1.28*  --  1.18*  CALCIUM 8.2* 8.4*  --  8.7*  MG  --   --   --  2.1    Recent Labs  06/04/16 2100 07/21/16 1500 09/04/16 1700  AST 17 19 18   ALT 14 14 15   ALKPHOS 94 113 109  BILITOT 0.5 0.7 0.8  PROT 5.1* 5.7* 5.7*  ALBUMIN 3.2* 3.7 3.8    Recent Labs  06/04/16 2100 07/21/16 1500 09/04/16 1700  WBC 8.6 11.1* 7.6  NEUTROABS 7.3* 9.9* 6.3  HGB 10.1* 11.2* 9.3*  HCT 30.2* 34.1* 28.1*  MCV 88.6 90.8 90.7  PLT 169 207 204   Lab Results  Component Value Date   TSH 2.985 09/04/2016   Lab Results  Component Value Date   HGBA1C 5.9 (H) 02/25/2016   Lab Results  Component Value Date   CHOL 127 02/09/2015   HDL 72 02/09/2015   LDLCALC 49 02/09/2015   TRIG 28 02/09/2015   CHOLHDL 1.8 02/09/2015    Significant Diagnostic Results in last 30 days:  No results found.  Assessment/Plan 1. Hemiplegia and hemiparesis following cerebral infarction affecting left non-dominant side (HCC)  Stable  Hand splints   Mobile with wheelchair  2. Hx of falling  Stable   3. Dysphagia following cerebral infarction  Stable   4. Dysphagia, oropharyngeal  Stable   5. Cognitive communication deficit  Stable   6. Hypertensive chronic kidney disease w stg 1-4/unsp chr kdny  Stable  Renally adjust meds as appropriate  7. Chronic kidney disease, stage III (moderate)  Stable  Renally adjust meds as appropriate  8. Vitamin B12 deficiency anemia due to intrinsic factor deficiency  Stable  Continue Cyanocobalamin 500 mcg po Q Day  9.  Personal history of nicotine dependence  Resolved   10. Anxiety disorder, unspecified type  Stable  Continue Clonazepam 0.25 mg po Q Day  11. Insomnia, unspecified type  Stable  Monitoring for needs   12. 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 Dry eye  syndrome of both lacrimal glands  Stable  Artificial tears- ointment in the Left eye Q HS  Continue Hypotears- 1 drop in each eye QID  14. Allergic rhinitis, unspecified seasonality, unspecified trigger  Stable  Continue Fluticasone 50 mcg - 1 spray per nostril Q Day  15. Bell's palsy  Stable  Artificial tears- ointment in the Left eye Q HS  16. Nonrheumatic aortic valve insufficiency  Stable   17. Aneurysm, ascending aorta (HCC)  Stable   18. Benign essential HTN  Stable  Continue Furosemide 40 mg po Q Day  Continue Klor-Con 20 meq po BID  Spirolalactone 25 mg po Q Day  19. Paroxysmal atrial fibrillation (HCC)  Stable  Continue Cardizem 60 mg po Q Day  Continue Eliquis 2.5 mg po BID  20. Chronic obstructive pulmonary disease, unspecified COPD type (HCC)  Stable  Continue Tessalon Perles 100 mg po Q 8 hrs PRN  Continue Breo-Ellipta 100-25 mcg- 1 inhalation Q Day  Continue Combivent 20-200 QID  Proventil 90 mcg- 2 puffs Q 4 hours prn  Proventil 90 mcg- 2 puffs TID x 7 days  21. Age related osteoporosis, unspecified pathological fracture presence  Not- stable  Recent multi-level Kyphoplasty for compression fractures  Cholecalciferol 4,000 units po Q Day  22. Other chronic pain  Stable  Continue Tylenol 650 mg po QID  Discontinue Oxycodone 5 mg po QID scheduled  Continue Oxycodone 5 mg po Q 4 hours prn  Discontinue Hydrocodone/APAP 5/325 mg 1-2 tablets po Q 4 hours prn #120, no refill (S/O Kyphoplasty)  Continue Methocarbamol 500 mg- 1/2 tablet po TID for spasms  Continue meloxicam 7.5 mg po Q 12 hours- severe pain  Voltaren gel 1%- apply 4 grams to the lumbar spine QID prn  Fentanyl 12 mcg patch- Q 3 days for pain # 5  23. Hyperlipidemia, unspecified hyperlipidemia type  Stable  Continue Atorvastatin 20 mg po Q HS  24. Addison anemia  Stable  Continue Ferrous Sulfate 325 mg po BID  25. Gastroesophageal Reflux disease  without esophagitis  Stable  Continue Omeprazole 40 mg po Q Day  Family/ staff Communication:   Total Time:  Documentation:  Face to Face:  Family/Phone:   Labs/tests ordered:    Medication list reviewed and assessed for continued appropriateness. Monthly medication orders reviewed and signed.  Vikki Ports, NP-C Geriatrics [facility] [id number], [place] [address] Cell Phone (Mon-Fri 8am-5pm):  [phone] On Call:  [phone] & follow prompts after 5pm & weekends Office Phone:  [phone] Office Fax:  [phone]

## 2016-10-03 ENCOUNTER — Encounter
Admission: RE | Admit: 2016-10-03 | Discharge: 2016-10-03 | Disposition: A | Discharge status: Home / Self Care | Payer: Medicare Other | Source: Ambulatory Visit | Attending: Internal Medicine | Admitting: Internal Medicine

## 2016-10-03 DIAGNOSIS — R0600 Dyspnea, unspecified: Secondary | ICD-10-CM | POA: Insufficient documentation

## 2016-10-04 DIAGNOSIS — R293 Abnormal posture: Secondary | ICD-10-CM | POA: Diagnosis not present

## 2016-10-06 ENCOUNTER — Non-Acute Institutional Stay (SKILLED_NURSING_FACILITY): Payer: Medicare Other | Admitting: Gerontology

## 2016-10-06 DIAGNOSIS — J449 Chronic obstructive pulmonary disease, unspecified: Secondary | ICD-10-CM | POA: Diagnosis not present

## 2016-10-06 DIAGNOSIS — G8929 Other chronic pain: Secondary | ICD-10-CM

## 2016-10-07 DIAGNOSIS — R293 Abnormal posture: Secondary | ICD-10-CM | POA: Diagnosis not present

## 2016-10-09 ENCOUNTER — Non-Acute Institutional Stay (SKILLED_NURSING_FACILITY): Payer: Medicare Other | Admitting: Gerontology

## 2016-10-09 DIAGNOSIS — R0602 Shortness of breath: Secondary | ICD-10-CM | POA: Diagnosis not present

## 2016-10-09 DIAGNOSIS — R0600 Dyspnea, unspecified: Secondary | ICD-10-CM | POA: Diagnosis not present

## 2016-10-09 DIAGNOSIS — R0789 Other chest pain: Secondary | ICD-10-CM

## 2016-10-09 DIAGNOSIS — M545 Low back pain: Secondary | ICD-10-CM | POA: Diagnosis not present

## 2016-10-09 DIAGNOSIS — R109 Unspecified abdominal pain: Secondary | ICD-10-CM | POA: Diagnosis not present

## 2016-10-09 DIAGNOSIS — K567 Ileus, unspecified: Secondary | ICD-10-CM | POA: Diagnosis not present

## 2016-10-09 DIAGNOSIS — G8929 Other chronic pain: Secondary | ICD-10-CM | POA: Diagnosis not present

## 2016-10-09 DIAGNOSIS — R062 Wheezing: Secondary | ICD-10-CM | POA: Diagnosis not present

## 2016-10-09 DIAGNOSIS — R293 Abnormal posture: Secondary | ICD-10-CM | POA: Diagnosis not present

## 2016-10-09 LAB — COMPREHENSIVE METABOLIC PANEL
ALT: 12 U/L — ABNORMAL LOW (ref 14–54)
ANION GAP: 6 (ref 5–15)
AST: 19 U/L (ref 15–41)
Albumin: 3.7 g/dL (ref 3.5–5.0)
Alkaline Phosphatase: 88 U/L (ref 38–126)
BUN: 26 mg/dL — ABNORMAL HIGH (ref 6–20)
CO2: 28 mmol/L (ref 22–32)
Calcium: 9 mg/dL (ref 8.9–10.3)
Chloride: 104 mmol/L (ref 101–111)
Creatinine, Ser: 1.43 mg/dL — ABNORMAL HIGH (ref 0.44–1.00)
GFR calc Af Amer: 36 mL/min — ABNORMAL LOW (ref >60)
GFR calc non Af Amer: 31 mL/min — ABNORMAL LOW (ref >60)
GLUCOSE: 121 mg/dL — AB (ref 65–99)
POTASSIUM: 5.3 mmol/L — AB (ref 3.5–5.1)
SODIUM: 138 mmol/L (ref 135–145)
TOTAL PROTEIN: 5.6 g/dL — AB (ref 6.5–8.1)
Total Bilirubin: 0.8 mg/dL (ref 0.3–1.2)

## 2016-10-09 LAB — CBC WITH DIFFERENTIAL/PLATELET
BASOS ABS: 0 10*3/uL (ref 0–0.1)
BASOS PCT: 0 %
EOS ABS: 0 10*3/uL (ref 0–0.7)
EOS PCT: 0 %
HCT: 32.4 % — ABNORMAL LOW (ref 35.0–47.0)
Hemoglobin: 10.8 g/dL — ABNORMAL LOW (ref 12.0–16.0)
Lymphocytes Relative: 7 %
Lymphs Abs: 0.6 10*3/uL — ABNORMAL LOW (ref 1.0–3.6)
MCH: 30.4 pg (ref 26.0–34.0)
MCHC: 33.2 g/dL (ref 32.0–36.0)
MCV: 91.5 fL (ref 80.0–100.0)
MONO ABS: 0.3 10*3/uL (ref 0.2–0.9)
Monocytes Relative: 3 %
Neutro Abs: 7.7 10*3/uL — ABNORMAL HIGH (ref 1.4–6.5)
Neutrophils Relative %: 90 %
PLATELETS: 181 10*3/uL (ref 150–440)
RBC: 3.55 MIL/uL — ABNORMAL LOW (ref 3.80–5.20)
RDW: 16.5 % — AB (ref 11.5–14.5)
WBC: 8.6 10*3/uL (ref 3.6–11.0)

## 2016-10-09 LAB — BRAIN NATRIURETIC PEPTIDE: B Natriuretic Peptide: 211 pg/mL — ABNORMAL HIGH (ref 0.0–100.0)

## 2016-10-10 DIAGNOSIS — J449 Chronic obstructive pulmonary disease, unspecified: Secondary | ICD-10-CM | POA: Diagnosis not present

## 2016-10-10 DIAGNOSIS — F325 Major depressive disorder, single episode, in full remission: Secondary | ICD-10-CM | POA: Diagnosis not present

## 2016-10-10 DIAGNOSIS — I482 Chronic atrial fibrillation: Secondary | ICD-10-CM | POA: Diagnosis not present

## 2016-10-10 DIAGNOSIS — S29011A Strain of muscle and tendon of front wall of thorax, initial encounter: Secondary | ICD-10-CM | POA: Diagnosis not present

## 2016-10-10 NOTE — Progress Notes (Signed)
Location:      Place of Service:  SNF (31) Provider:  Toni Arthurs, NP-C  Juluis Pitch, MD  Patient Care Team: Juluis Pitch, MD as PCP - [facility])  Extended Emergency Contact Information Primary Emergency Contact: [name] Address: [address]          [place], [place] [address] [name] of [place] Phone: [phone] Relation: Daughter Secondary Emergency Contact: Lingerfelt,Brad Address: [address] [place],  [address] [name] of [place] Phone: [phone] Relation: [name]  Code Status:  DNR Goals of care: Advanced Directive information Advanced Directives 08/22/2016  Does Patient Have a Medical Advance Directive? No  Type of Advance Directive -  Would patient like information on creating a medical advance directive? No - Patient declined     Chief Complaint  Patient presents with  . Acute Visit    HPI:  Pt is a 83 y.o. female seen today for an acute visit for severe pain in Right flank/ lower ribs and worsening pain in the back. Pt has had ongoing, chronic issues with pain in the lower back for several months d/t multiple compression fractures that have had fixation via kyphoplasty. Pt also has chronic chest wall pain irritated by cough resulting from COPD, pulmonary hypertension.Pt pain regimen was recently changed from Oxycodone to Fentanyl patches. Some pain exacerbation is expected d/t adjustment to new med/ dose, etc. However, pt c/o feeling like her ribs are going to break when she talks or exerts any abdominal pressure. Says the pain is in the Right lower ribs, below the right breast. She is unable to completely describe the pain, other than "it just hurts." Pt denies any other symptoms. Denies n/v/d/f/c/cp (cardiac related)/ SOB/ HA/Abd pain/ dizziness/worsening cough. VSS. No other complaints.      Past Medical History:  Diagnosis Date  . Adenoma of rectum   . Adrenal adenoma   . Anemia     . Arthritis   . Ascending aortic aneurysm ([place])    a. s/p repair 07/2014  . Atrophic vaginitis   . COPD (chronic obstructive pulmonary disease) ([place])   . Coronary artery disease, non-occlusive    a. LHC 06/2014 without evidence of obstructive disease  . Depression   . Diverticulosis   . Dysphagia   . Dysrhythmia   . Frequent UTI   . GERD (gastroesophageal reflux disease)   . Hemiplegia and hemiparesis ([place])    following cerebral infarction affecting left non-dominant side  . Hypertension   . Microscopic hematuria   . Nonrheumatic aortic valve insufficiency   . Osteoporosis   . Panic attacks   . Paroxysmal atrial fibrillation (HCC)   . Renal cyst    CKD STAGE 3  . Stroke [facility])    a. post-operative setting in 07/2014 leading to left UE hemiapresis and left LE weakness  . Urge incontinence    Past Surgical History:  Procedure Laterality Date  . ABDOMINAL HYSTERECTOMY    . AORTOILIAC BYPASS    . GASTROSTOMY W/ FEEDING TUBE    . INTRAMEDULLARY (IM) NAIL INTERTROCHANTERIC Left 02/26/2016   Procedure: INTRAMEDULLARY (IM) NAIL INTERTROCHANTRIC;  Surgeon: Earnestine Leys, MD;  Location: [facility] ORS;  Service: Orthopedics;  Laterality: Left;  . KYPHOPLASTY N/A 08/26/2016   Procedure: KYPHOPLASTY T12;  Surgeon: Hessie Knows, MD;  Location: [facility] ORS;  Service: Orthopedics;  Laterality: N/A;    Allergies  Allergen Reactions  . Iodinated Diagnostic Agents Itching and  Swelling  . Ciprofloxacin Other (See Comments)    Colitis  . Nitrofurantoin Diarrhea  . Solifenacin Other (See Comments)    indigestion  . Metronidazole Itching and Rash    Allergies as of 10/09/2016      Reactions   Iodinated Diagnostic Agents Itching, Swelling   Ciprofloxacin Other (See Comments)   Colitis   Nitrofurantoin Diarrhea   Solifenacin Other (See Comments)   indigestion   Metronidazole Itching, Rash      Medication List       Accurate as of 10/09/16 11:59 PM. Always use your most recent med list.           acetaminophen 325 MG tablet Commonly known as:  TYLENOL Take 650 mg by mouth every 4 (four) hours as needed for mild pain or fever.   albuterol 108 (90 Base) MCG/ACT inhaler Commonly known as:  PROVENTIL HFA;VENTOLIN HFA Inhale 2 puffs into the lungs every 4 (four) hours as needed for wheezing or shortness of breath.   artificial tears ointment Place 1 application into the left eye 4 (four) times daily.   atorvastatin 20 MG tablet Commonly known as:  LIPITOR Take 20 mg by mouth at bedtime.   benzonatate 100 MG capsule Commonly known as:  TESSALON Take 100 mg by mouth every 8 (eight) hours as needed for cough.   benzonatate 200 MG capsule Commonly known as:  TESSALON Take 200 mg by mouth 3 (three) times daily.   bisacodyl 10 MG suppository Commonly known as:  DULCOLAX Place 10 mg rectally daily as needed for moderate constipation.   BREO ELLIPTA 100-25 MCG/INH Aepb Generic drug:  fluticasone furoate-vilanterol Inhale 1 puff into the lungs daily.   Cholecalciferol 4000 units Caps Take 1 capsule by mouth daily.   clonazePAM 0.25 MG disintegrating tablet Commonly known as:  KLONOPIN Take 0.25 mg by mouth daily.   COMBIVENT RESPIMAT 20-100 MCG/ACT Aers respimat Generic drug:  Ipratropium-Albuterol Inhale 1 puff into the lungs every 6 (six) hours.   cyanocobalamin 1000 MCG tablet Take 1,000 mcg by mouth daily.   diclofenac sodium 1 % Gel Commonly known as:  VOLTAREN Apply 4 g topically 4 (four) times daily as needed (pain).   diltiazem 60 MG tablet Commonly known as:  CARDIZEM Take 60 mg by mouth daily.   docusate sodium 100 MG capsule Commonly known as:  COLACE Take 1 capsule (100 mg total) by mouth 2 (two) times daily.   ELIQUIS 2.5 MG Tabs tablet Generic drug:  apixaban Take 2.5 mg by mouth 2 (two) times daily.   ferrous sulfate 325 (65 FE) MG tablet Take 1 tablet (325 mg total) by mouth 2 (two) times daily with a meal.   fluticasone 50 MCG/ACT  nasal spray Commonly known as:  FLONASE Place 1 spray into both nostrils daily.   furosemide 40 MG tablet Commonly known as:  LASIX Take 40 mg by mouth daily.   HYDROcodone-acetaminophen 5-325 MG tablet Commonly known as:  NORCO Take 1-2 tablets by mouth every 6 (six) hours as needed for moderate pain.   magnesium hydroxide 400 MG/5ML suspension Commonly known as:  MILK OF MAGNESIA Take 30 mLs by mouth daily as needed for mild constipation.   Melatonin 3 MG Tabs Take 6 mg by mouth at bedtime.   meloxicam 7.5 MG tablet Commonly known as:  MOBIC Take 7.5 mg by mouth 2 (two) times daily.   methocarbamol 500 MG tablet Commonly known as:  ROBAXIN Take 250 mg by mouth 3 (three)  times daily.   omeprazole 40 MG capsule Commonly known as:  PRILOSEC Take 40 mg by mouth daily.   oxyCODONE 5 MG immediate release tablet Commonly known as:  ROXICODONE Take 1 tablet (5 mg total) by mouth every 8 (eight) hours as needed for moderate pain or severe pain.   polyethylene glycol packet Commonly known as:  MIRALAX Take 17 g by mouth 2 (two) times daily.   polyvinyl alcohol-povidone 1.4-0.6 % ophthalmic solution Commonly known as:  HYPOTEARS Place 1 drop into both eyes 4 (four) times daily as needed (dry eyes).   potassium chloride SA 20 MEQ tablet Commonly known as:  K-DUR,KLOR-CON Take 20 mEq by mouth 2 (two) times daily.   predniSONE 20 MG tablet Commonly known as:  DELTASONE Take 1 tablet (20 mg total) by mouth daily with breakfast.   senna 8.6 MG Tabs tablet Commonly known as:  SENOKOT Take 1 tablet (8.6 mg total) by mouth daily.   sertraline 100 MG tablet Commonly known as:  ZOLOFT Take 100 mg by mouth daily.       Review of Systems  Constitutional: Negative for activity change, appetite change, chills, diaphoresis, fatigue, fever and unexpected weight change (weight gain).  HENT: Negative for congestion, ear pain, mouth sores, nosebleeds, postnasal drip, rhinorrhea,  sinus pain, sinus pressure, sneezing, sore throat, trouble swallowing and voice change.   Eyes: Negative.   Respiratory: Positive for wheezing (chronic- intermittent). Negative for apnea, cough, choking, chest tightness and shortness of breath.   Cardiovascular: Negative for chest pain, palpitations and leg swelling.  Gastrointestinal: Negative for abdominal distention, abdominal pain, constipation, diarrhea and nausea.  Genitourinary: Negative for difficulty urinating, dysuria, frequency (chronic nocturia) and urgency.  Musculoskeletal: Positive for arthralgias (typical arthritis), back pain (chronic) and myalgias. Negative for gait problem.  Skin: Negative for color change, pallor, rash and wound.  Neurological: Negative for dizziness, tremors, syncope, speech difficulty, weakness, numbness and headaches.  Psychiatric/Behavioral: Negative for agitation and behavioral problems.  All other systems reviewed and are negative.   Immunization History  Administered Date(s) Administered  . Influenza-Unspecified 02/02/2014   Pertinent  Health Maintenance Due  Topic Date Due  . DEXA SCAN  07/01/1992  . PNA vac Low Risk Adult (1 of 2 - PCV13) 07/01/1992  . INFLUENZA VACCINE  12/03/2016   No flowsheet data found. Functional Status Survey:    Vitals:   10/09/16 1740  BP: (!) 131/47  Pulse: 68  Resp: 18  Temp: 98.7 F (37.1 C)  SpO2: 97%  Weight: 121 lb (54.9 kg)   Body mass index is 22.13 kg/m. Physical Exam  Constitutional: She is oriented to person, place, and time. Vital signs are normal. She appears well-developed and well-nourished. She is active and cooperative. She does not appear ill. No distress. Nasal cannula in place.  HENT:  Head: Normocephalic and atraumatic.  Mouth/Throat: Uvula is midline, oropharynx is clear and moist and mucous membranes are normal. Mucous membranes are not pale, not dry and not cyanotic.  Eyes: Conjunctivae, EOM and lids are normal. Pupils are  equal, round, and reactive to light.  Neck: Trachea normal, normal range of motion and full passive range of motion without pain. Neck supple. No JVD present. No tracheal deviation, no edema and no erythema present. No thyromegaly present.  Cardiovascular: Normal rate, regular rhythm, normal heart sounds and intact distal pulses.  Exam reveals no gallop, no distant heart sounds and no friction rub.   No murmur heard. Pulses:  Dorsalis pedis pulses are 1+ on the right side, and 1+ on the left side.  Pulmonary/Chest: Effort normal. No accessory muscle usage. No respiratory distress. She has no decreased breath sounds. She has wheezes in the right middle field, the right lower field, the left middle field and the left lower field. She has rhonchi (chronic/intermittent) in the right lower field and the left lower field. She has no rales. She exhibits no tenderness.  Abdominal: Soft. Normal appearance and bowel sounds are normal. She exhibits no distension and no ascites. There is no tenderness.  Musculoskeletal: Normal range of motion. She exhibits no edema or tenderness.       Lumbar back: She exhibits pain.       Back:       Arms: Expected osteoarthritis, stiffness  Neurological: She is alert and oriented to person, place, and time. She has normal strength.  Skin: Skin is warm, dry and intact. No rash noted. She is not diaphoretic. No cyanosis or erythema. No pallor. Nails show no clubbing.  Psychiatric: She has a normal mood and affect. Her speech is normal and behavior is normal. Judgment and thought content normal. Cognition and memory are normal.  Nursing note and vitals reviewed.   Labs reviewed:  Recent Labs  07/21/16 1500 08/22/16 1036 09/04/16 1700 10/09/16 1500  NA 138  --  140 138  K 4.5 4.9 4.7 5.3*  CL 102  --  105 104  CO2 27  --  29 28  GLUCOSE 202*  --  106* 121*  BUN 18  --  25* 26*  CREATININE 1.28*  --  1.18* 1.43*  CALCIUM 8.4*  --  8.7* 9.0  MG  --   --  2.1   --     Recent Labs  07/21/16 1500 09/04/16 1700 10/09/16 1500  AST '19 18 19  '$ ALT 14 15 12*  ALKPHOS 113 109 88  BILITOT 0.7 0.8 0.8  PROT 5.7* 5.7* 5.6*  ALBUMIN 3.7 3.8 3.7    Recent Labs  07/21/16 1500 09/04/16 1700 10/09/16 1500  WBC 11.1* 7.6 8.6  NEUTROABS 9.9* 6.3 7.7*  HGB 11.2* 9.3* 10.8*  HCT 34.1* 28.1* 32.4*  MCV 90.8 90.7 91.5  PLT 207 204 181   Lab Results  Component Value Date   TSH 2.985 09/04/2016   Lab Results  Component Value Date   HGBA1C 5.9 (H) 02/25/2016   Lab Results  Component Value Date   CHOL 127 02/09/2015   HDL 72 02/09/2015   LDLCALC 49 02/09/2015   TRIG 28 02/09/2015   CHOLHDL 1.8 02/09/2015    Significant Diagnostic Results in last 30 days:  No results found.  Assessment/Plan 1. Ileus (HCC)  0.9% NS- 75 ml/ hr continuous x 72 hours for ileus- monitor closely for fluid overload  NPO except sips of clears and ice chips through the weekend- OK to give PO Meds  OK to resume normal diet on Monday  Metoclopramide 5 mg IV Q 6 hours x 8 doses  Metoclopramide 5 mg po BID starting Monday to increase GI motility (recurrent ileus)   Pt is agreeable to plan  2. Chronic midline low back pain, with sciatica presence unspecified  Increase Fentanyl patch to 24 mcg/ hr- change Q 3 days  Continue Mobic 7.5 mg po BID  Continue scheduled Tylenol 650 mg po QID  Continue Methocarbamol 500 mg - 1/2 tablet po TID  Continue Voltaren 1% gel- 4 grams QID to the lumbar spine  Continue  Sertraline 125 mg po Q Day for depression and chronic pain  3. Chronic chest wall pain  Aspercreme (Lidocaine) patch 4%- Apply to right ribs daily, remove patch after 12 hours  Family/ staff Communication:   Total Time:  Documentation:  Face to Face:  Family/Phone:   Labs/tests ordered:  Cbc, met c, bnp, 2 view CXR, Abdominal xray  Medication list reviewed and assessed for continued appropriateness.  Vikki Ports,  NP-C Geriatrics [facility] [phone], [place] [address] Cell Phone (Mon-Fri 8am-5pm):  [phone] On Call:  [phone] & follow prompts after 5pm & weekends Office Phone:  [phone] Office Fax:  [phone]

## 2016-10-11 DIAGNOSIS — R293 Abnormal posture: Secondary | ICD-10-CM | POA: Diagnosis not present

## 2016-10-13 ENCOUNTER — Non-Acute Institutional Stay (SKILLED_NURSING_FACILITY): Payer: Medicare Other | Admitting: Gerontology

## 2016-10-13 DIAGNOSIS — M545 Low back pain: Secondary | ICD-10-CM | POA: Diagnosis not present

## 2016-10-13 DIAGNOSIS — R0789 Other chest pain: Secondary | ICD-10-CM

## 2016-10-13 DIAGNOSIS — G8929 Other chronic pain: Secondary | ICD-10-CM | POA: Diagnosis not present

## 2016-10-13 DIAGNOSIS — K567 Ileus, unspecified: Secondary | ICD-10-CM | POA: Diagnosis not present

## 2016-10-14 DIAGNOSIS — R293 Abnormal posture: Secondary | ICD-10-CM | POA: Diagnosis not present

## 2016-10-16 DIAGNOSIS — R293 Abnormal posture: Secondary | ICD-10-CM | POA: Diagnosis not present

## 2016-10-17 DIAGNOSIS — R293 Abnormal posture: Secondary | ICD-10-CM | POA: Diagnosis not present

## 2016-10-22 ENCOUNTER — Other Ambulatory Visit
Admission: RE | Admit: 2016-10-22 | Discharge: 2016-10-22 | Disposition: A | Discharge status: Home / Self Care | Payer: Medicare Other | Source: Ambulatory Visit | Attending: Gerontology | Admitting: Gerontology

## 2016-10-22 DIAGNOSIS — R300 Dysuria: Secondary | ICD-10-CM | POA: Diagnosis not present

## 2016-10-22 DIAGNOSIS — R3915 Urgency of urination: Secondary | ICD-10-CM | POA: Insufficient documentation

## 2016-10-22 DIAGNOSIS — R350 Frequency of micturition: Secondary | ICD-10-CM | POA: Diagnosis not present

## 2016-10-22 LAB — URINALYSIS, COMPLETE (UACMP) WITH MICROSCOPIC
BACTERIA UA: NONE SEEN
Bilirubin Urine: NEGATIVE
Glucose, UA: NEGATIVE mg/dL
Hgb urine dipstick: NEGATIVE
Ketones, ur: NEGATIVE mg/dL
Nitrite: NEGATIVE
Protein, ur: NEGATIVE mg/dL
RBC / HPF: NONE SEEN RBC/hpf (ref 0–5)
SPECIFIC GRAVITY, URINE: 1.01 (ref 1.005–1.030)
pH: 6 (ref 5.0–8.0)

## 2016-10-22 NOTE — Progress Notes (Signed)
Location:      Place of Service:  SNF (31) Provider:  Toni Arthurs, NP-C  Juluis Pitch, MD  Patient Care Team: Juluis Pitch, MD as PCP - General [facility])  Extended Emergency Contact Information Primary Emergency Contact: [name] Address: [address]          [place], [place] [address] [name] of [place] Phone: [phone] Relation: Daughter Secondary Emergency Contact: Lingerfelt,Brad Address: [address] [place], [place] [address] [name] of [place] Phone: [phone] Relation: [name]  Code Status:  DNR Goals of care: Advanced Directive information Advanced Directives 08/22/2016  Does Patient Have a Medical Advance Directive? No  Type of Advance Directive -  Would patient like information on creating a medical advance directive? No - Patient declined     Chief Complaint  Patient presents with  . Follow-up    HPI:  Pt is a 83 y.o. female seen today for follow up on COPD and chronic pain. Insurance would no longer cover the Lidocaine patches or the Combivent. Lidocaine patches were d/c'ed a few weeks ago and pt has had recurrent pain in the right ribs from coughing r/t the copd. Insurance won't cover the Combivent, so medication was changed to scheduled Duonebs last week. Pt reports she is feeling better, breathing better. Pain in the ribs remains. Will change to Lidocaine 4% patch- which Insurance tends to cover. Will reassess pain soon. Otherwise, pt reports she is feeling well and has no other complaints. VSS,     Past Medical History:  Diagnosis Date  . Adenoma of rectum   . Adrenal adenoma   . Anemia   . Arthritis   . Ascending aortic aneurysm ([place])    a. s/p repair 07/2014  . Atrophic vaginitis   . COPD (chronic obstructive pulmonary disease) ([place])   . Coronary artery disease, non-occlusive    a. LHC 06/2014 without evidence of obstructive disease  . Depression   . Diverticulosis   . Dysphagia   .  Dysrhythmia   . Frequent UTI   . GERD (gastroesophageal reflux disease)   . Hemiplegia and hemiparesis ([place])    following cerebral infarction affecting left non-dominant side  . Hypertension   . Microscopic hematuria   . Nonrheumatic aortic valve insufficiency   . Osteoporosis   . Panic attacks   . Paroxysmal atrial fibrillation (H[facility])   . Renal cyst    CKD STAGE 3  . Stroke [facility])    a. post-operative setting in 07/2014 leading to left UE hemiapresis and left LE weakness  . Urge incontinence    Past Surgical History:  Procedure Laterality Date  . ABDOMINAL HYSTERECTOMY    . AORTOILIAC BYPASS    . GASTROSTOMY W/ FEEDING TUBE    . INTRAMEDULLARY (IM) NAIL INTERTROCHANTERIC Left 02/26/2016   Procedure: INTRAMEDULLARY (IM) NAIL INTERTROCHANTRIC;  Surgeon: Earnestine Leys, MD;  Location: [facility] ORS;  Service: Orthopedics;  Laterality: Left;  . KYPHOPLASTY N/A 08/26/2016   Procedure: KYPHOPLASTY T12;  Surgeon: Hessie Knows, MD;  Location: [facility] ORS;  Service: Orthopedics;  Laterality: N/A;    Allergies  Allergen Reactions  . Iodinated Diagnostic Agents Itching and Swelling  . Ciprofloxacin Other (See Comments)    Colitis  . Nitrofurantoin Diarrhea  . Solifenacin Other (See Comments)    indigestion  . Metronidazole Itching and Rash    Allergies as of 10/06/2016      Reactions   Iodinated Diagnostic Agents  Itching, Swelling   Ciprofloxacin Other (See Comments)   Colitis   Nitrofurantoin Diarrhea   Solifenacin Other (See Comments)   indigestion   Metronidazole Itching, Rash      Medication List       Accurate as of 10/06/16 11:59 PM. Always use your most recent med list.          acetaminophen 325 MG tablet Commonly known as:  TYLENOL Take 650 mg by mouth every 4 (four) hours as needed for mild pain or fever.   albuterol 108 (90 Base) MCG/ACT inhaler Commonly known as:  PROVENTIL HFA;VENTOLIN HFA Inhale 2 puffs into the lungs every 4 (four) hours as needed for wheezing or  shortness of breath.   artificial tears ointment Place 1 application into the left eye 4 (four) times daily.   atorvastatin 20 MG tablet Commonly known as:  LIPITOR Take 20 mg by mouth at bedtime.   benzonatate 100 MG capsule Commonly known as:  TESSALON Take 100 mg by mouth every 8 (eight) hours as needed for cough.   benzonatate 200 MG capsule Commonly known as:  TESSALON Take 200 mg by mouth 3 (three) times daily.   bisacodyl 10 MG suppository Commonly known as:  DULCOLAX Place 10 mg rectally daily as needed for moderate constipation.   BREO ELLIPTA 100-25 MCG/INH Aepb Generic drug:  fluticasone furoate-vilanterol Inhale 1 puff into the lungs daily.   Cholecalciferol 4000 units Caps Take 1 capsule by mouth daily.   clonazePAM 0.25 MG disintegrating tablet Commonly known as:  KLONOPIN Take 0.25 mg by mouth daily.   COMBIVENT RESPIMAT 20-100 MCG/ACT Aers respimat Generic drug:  Ipratropium-Albuterol Inhale 1 puff into the lungs every 6 (six) hours.   cyanocobalamin 1000 MCG tablet Take 1,000 mcg by mouth daily.   diclofenac sodium 1 % Gel Commonly known as:  VOLTAREN Apply 4 g topically 4 (four) times daily as needed (pain).   diltiazem 60 MG tablet Commonly known as:  CARDIZEM Take 60 mg by mouth daily.   docusate sodium 100 MG capsule Commonly known as:  COLACE Take 1 capsule (100 mg total) by mouth 2 (two) times daily.   ELIQUIS 2.5 MG Tabs tablet Generic drug:  apixaban Take 2.5 mg by mouth 2 (two) times daily.   ferrous sulfate 325 (65 FE) MG tablet Take 1 tablet (325 mg total) by mouth 2 (two) times daily with a meal.   fluticasone 50 MCG/ACT nasal spray Commonly known as:  FLONASE Place 1 spray into both nostrils daily.   furosemide 40 MG tablet Commonly known as:  LASIX Take 40 mg by mouth daily.   HYDROcodone-acetaminophen 5-325 MG tablet Commonly known as:  NORCO Take 1-2 tablets by mouth every 6 (six) hours as needed for moderate pain.    magnesium hydroxide 400 MG/5ML suspension Commonly known as:  MILK OF MAGNESIA Take 30 mLs by mouth daily as needed for mild constipation.   Melatonin 3 MG Tabs Take 6 mg by mouth at bedtime.   meloxicam 7.5 MG tablet Commonly known as:  MOBIC Take 7.5 mg by mouth 2 (two) times daily.   methocarbamol 500 MG tablet Commonly known as:  ROBAXIN Take 250 mg by mouth 3 (three) times daily.   omeprazole 40 MG capsule Commonly known as:  PRILOSEC Take 40 mg by mouth daily.   oxyCODONE 5 MG immediate release tablet Commonly known as:  ROXICODONE Take 1 tablet (5 mg total) by mouth every 8 (eight) hours as needed for moderate pain  or severe pain.   polyethylene glycol packet Commonly known as:  MIRALAX Take 17 g by mouth 2 (two) times daily.   polyvinyl alcohol-povidone 1.4-0.6 % ophthalmic solution Commonly known as:  HYPOTEARS Place 1 drop into both eyes 4 (four) times daily as needed (dry eyes).   potassium chloride SA 20 MEQ tablet Commonly known as:  K-DUR,KLOR-CON Take 20 mEq by mouth 2 (two) times daily.   predniSONE 20 MG tablet Commonly known as:  DELTASONE Take 1 tablet (20 mg total) by mouth daily with breakfast.   senna 8.6 MG Tabs tablet Commonly known as:  SENOKOT Take 1 tablet (8.6 mg total) by mouth daily.   sertraline 100 MG tablet Commonly known as:  ZOLOFT Take 100 mg by mouth daily.       Review of Systems  Constitutional: Negative for activity change, appetite change, chills, diaphoresis, fatigue, fever and unexpected weight change (weight gain).  HENT: Negative for congestion, ear pain, mouth sores, nosebleeds, postnasal drip, rhinorrhea, sinus pain, sinus pressure, sneezing, sore throat, trouble swallowing and voice change.   Eyes: Negative.   Respiratory: Negative for apnea, cough, choking, chest tightness, shortness of breath and wheezing.   Cardiovascular: Negative for chest pain, palpitations and leg swelling.  Gastrointestinal: Negative for  abdominal distention, abdominal pain, constipation, diarrhea and nausea.  Genitourinary: Negative for difficulty urinating, dysuria, frequency (chronic nocturia) and urgency.  Musculoskeletal: Positive for arthralgias (typical arthritis) and back pain (chronic). Negative for gait problem and myalgias.  Skin: Negative for color change, pallor, rash and wound.  Neurological: Negative for dizziness, tremors, syncope, speech difficulty, weakness, numbness and headaches.  Psychiatric/Behavioral: Negative for agitation and behavioral problems.  All other systems reviewed and are negative.   Immunization History  Administered Date(s) Administered  . Influenza-Unspecified 02/02/2014   Pertinent  Health Maintenance Due  Topic Date Due  . DEXA SCAN  07/01/1992  . PNA vac Low Risk Adult (1 of 2 - PCV13) 07/01/1992  . INFLUENZA VACCINE  12/03/2016   No flowsheet data found. Functional Status Survey:    Vitals:   09/30/16 0315  BP: (!) 183/58  Pulse: 74  Resp: 20  Temp: 98 F (36.7 C)  SpO2: 98%  Weight: 125 lb (56.7 kg)   Body mass index is 22.86 kg/m. Physical Exam  Constitutional: She is oriented to person, place, and time. Vital signs are normal. She appears well-developed and well-nourished. She is active and cooperative. She does not appear ill. No distress. Nasal cannula in place.  HENT:  Head: Normocephalic and atraumatic.  Mouth/Throat: Uvula is midline, oropharynx is clear and moist and mucous membranes are normal. Mucous membranes are not pale, not dry and not cyanotic.  Eyes: Conjunctivae, EOM and lids are normal. Pupils are equal, round, and reactive to light.  Neck: Trachea normal, normal range of motion and full passive range of motion without pain. Neck supple. No JVD present. No tracheal deviation, no edema and no erythema present. No thyromegaly present.  Cardiovascular: Normal rate, regular rhythm, normal heart sounds and intact distal pulses.  Exam reveals no gallop,  no distant heart sounds and no friction rub.   No murmur heard. Pulses:      Dorsalis pedis pulses are 1+ on the right side, and 1+ on the left side.  3+ B-pitting edema. TED Hose in place  Pulmonary/Chest: Effort normal. No accessory muscle usage. No respiratory distress. She has decreased breath sounds in the right lower field and the left lower field. She has no  wheezes. She has no rhonchi. She has no rales. She exhibits no tenderness.  Abdominal: Soft. Normal appearance and bowel sounds are normal. She exhibits no distension and no ascites. There is no tenderness.  Musculoskeletal: Normal range of motion. She exhibits no edema or tenderness.  Expected osteoarthritis, stiffness  Neurological: She is alert and oriented to person, place, and time. She has normal strength.  Skin: Skin is warm, dry and intact. No rash noted. She is not diaphoretic. No cyanosis or erythema. No pallor. Nails show no clubbing.  Psychiatric: She has a normal mood and affect. Her speech is normal and behavior is normal. Judgment and thought content normal. Cognition and memory are normal.  Nursing note and vitals reviewed.   Labs reviewed:  Recent Labs  07/21/16 1500 08/22/16 1036 09/04/16 1700 10/09/16 1500  NA 138  --  140 138  K 4.5 4.9 4.7 5.3*  CL 102  --  105 104  CO2 27  --  29 28  GLUCOSE 202*  --  106* 121*  BUN 18  --  25* 26*  CREATININE 1.28*  --  1.18* 1.43*  CALCIUM 8.4*  --  8.7* 9.0  MG  --   --  2.1  --     Recent Labs  07/21/16 1500 09/04/16 1700 10/09/16 1500  AST 19 18 19   ALT 14 15 12*  ALKPHOS 113 109 88  BILITOT 0.7 0.8 0.8  PROT 5.7* 5.7* 5.6*  ALBUMIN 3.7 3.8 3.7    Recent Labs  07/21/16 1500 09/04/16 1700 10/09/16 1500  WBC 11.1* 7.6 8.6  NEUTROABS 9.9* 6.3 7.7*  HGB 11.2* 9.3* 10.8*  HCT 34.1* 28.1* 32.4*  MCV 90.8 90.7 91.5  PLT 207 204 181   Lab Results  Component Value Date   TSH 2.985 09/04/2016   Lab Results  Component Value Date   HGBA1C 5.9  (H) 02/25/2016   Lab Results  Component Value Date   CHOL 127 02/09/2015   HDL 72 02/09/2015   LDLCALC 49 02/09/2015   TRIG 28 02/09/2015   CHOLHDL 1.8 02/09/2015    Significant Diagnostic Results in last 30 days:  No results found.  Assessment/Plan 1. Other chronic pain  Continue Fentanyl 12 mcg patch Q 3 days  Aspercreme (lidocaine) 4% patch to right ribs/flank Q day, remove after 12 hours for chronic rib pain   Continue Oxycodone 5 mg po Q 4 hours prn  Continue Methocarbamol 500 mg 1/2 tablet po TID  Continue Tylenol 650 mg po QID  2. Chronic obstructive pulmonary disease, unspecified COPD type ([place])  Continue scheduled Duo nebs TID  Continue Scheduled Proventil 2 puffs TID x 1 more day   Family/ staff Communication:   Total Time:  Documentation:  Face to Face:  Family/Phone:   Labs/tests ordered:    Medication list reviewed and assessed for continued appropriateness. Monthly medication orders reviewed and signed.  Vikki Ports, NP-C Geriatrics [facility] [phone], [place] [address] Cell Phone (Mon-Fri 8am-5pm):  [phone] On Call:  [phone] & follow prompts after 5pm & weekends Office Phone:  [phone] Office Fax:  [phone]

## 2016-10-23 LAB — URINE CULTURE

## 2016-10-31 DIAGNOSIS — M549 Dorsalgia, unspecified: Secondary | ICD-10-CM | POA: Diagnosis not present

## 2016-11-02 ENCOUNTER — Encounter
Admission: RE | Admit: 2016-11-02 | Discharge: 2016-11-02 | Disposition: A | Discharge status: Home / Self Care | Payer: Medicare Other | Source: Ambulatory Visit | Attending: Internal Medicine | Admitting: Internal Medicine

## 2016-11-02 NOTE — Progress Notes (Signed)
Location:      Place of Service:  SNF (31) Provider:  Toni Arthurs, NP-C  Juluis Pitch, MD  Patient Care Team: Juluis Pitch, MD as PCP - General [facility])  Extended Emergency Contact Information Primary Emergency Contact: [name] Address: [address]          [place], [place] [address] [name] of [place] Phone: [phone] Relation: Daughter Secondary Emergency Contact: Lingerfelt,Brad Address: [address] [place], [place] [address] [name] of [place] Phone: [phone] Relation: [name]  Code Status:  DNR Goals of care: Advanced Directive information Advanced Directives 08/22/2016  Does Patient Have a Medical Advance Directive? No  Type of Advance Directive -  Would patient like information on creating a medical advance directive? No - Patient declined     Chief Complaint  Patient presents with  . Follow-up    HPI:  Pt is a 83 y.o. female seen today for a follow up visit for severe pain in Right flank/ lower ribs and worsening pain in the back. Pt has had ongoing, chronic issues with pain in the lower back for several months d/t multiple compression fractures that have had fixation via kyphoplasty. Pt also has chronic chest wall pain irritated by cough resulting from COPD, pulmonary hypertension.Pt pain regimen was recently changed from Oxycodone to Fentanyl patches. Pt reports her pain is better and that she is feeling much better in general. Overall, pt looks a lot better today than last week. Denies n/v/d/f/c/cp (cardiac related)/ SOB/ HA/Abd pain/ dizziness/worsening cough. VSS. No other complaints.      Past Medical History:  Diagnosis Date  . Adenoma of rectum   . Adrenal adenoma   . Anemia   . Arthritis   . Ascending aortic aneurysm ([place])    a. s/p repair 07/2014  . Atrophic vaginitis   . COPD (chronic obstructive pulmonary disease) ([place])   . Coronary artery disease, non-occlusive    a. LHC 06/2014  without evidence of obstructive disease  . Depression   . Diverticulosis   . Dysphagia   . Dysrhythmia   . Frequent UTI   . GERD (gastroesophageal reflux disease)   . Hemiplegia and hemiparesis ([place])    following cerebral infarction affecting left non-dominant side  . Hypertension   . Microscopic hematuria   . Nonrheumatic aortic valve insufficiency   . Osteoporosis   . Panic attacks   . Paroxysmal atrial fibrillation (H[facility])   . Renal cyst    CKD STAGE 3  . Stroke [facility])    a. post-operative setting in 07/2014 leading to left UE hemiapresis and left LE weakness  . Urge incontinence    Past Surgical History:  Procedure Laterality Date  . ABDOMINAL HYSTERECTOMY    . AORTOILIAC BYPASS    . GASTROSTOMY W/ FEEDING TUBE    . INTRAMEDULLARY (IM) NAIL INTERTROCHANTERIC Left 02/26/2016   Procedure: INTRAMEDULLARY (IM) NAIL INTERTROCHANTRIC;  Surgeon: Earnestine Leys, MD;  Location: [facility] ORS;  Service: Orthopedics;  Laterality: Left;  . KYPHOPLASTY N/A 08/26/2016   Procedure: KYPHOPLASTY T12;  Surgeon: Hessie Knows, MD;  Location: [facility] ORS;  Service: Orthopedics;  Laterality: N/A;    Allergies  Allergen Reactions  . Iodinated Diagnostic Agents Itching and Swelling  . Ciprofloxacin Other (See Comments)    Colitis  . Nitrofurantoin Diarrhea  . Solifenacin Other (See Comments)    indigestion  . Metronidazole Itching and Rash    Allergies as of 10/13/2016  Reactions   Iodinated Diagnostic Agents Itching, Swelling   Ciprofloxacin Other (See Comments)   Colitis   Nitrofurantoin Diarrhea   Solifenacin Other (See Comments)   indigestion   Metronidazole Itching, Rash      Medication List       Accurate as of 10/13/16 11:59 PM. Always use your most recent med list.          acetaminophen 325 MG tablet Commonly known as:  TYLENOL Take 650 mg by mouth every 4 (four) hours as needed for mild pain or fever.   albuterol 108 (90 Base) MCG/ACT inhaler Commonly known as:  PROVENTIL  HFA;VENTOLIN HFA Inhale 2 puffs into the lungs every 4 (four) hours as needed for wheezing or shortness of breath.   artificial tears ointment Place 1 application into the left eye 4 (four) times daily.   atorvastatin 20 MG tablet Commonly known as:  LIPITOR Take 20 mg by mouth at bedtime.   benzonatate 100 MG capsule Commonly known as:  TESSALON Take 100 mg by mouth every 8 (eight) hours as needed for cough.   benzonatate 200 MG capsule Commonly known as:  TESSALON Take 200 mg by mouth 3 (three) times daily.   bisacodyl 10 MG suppository Commonly known as:  DULCOLAX Place 10 mg rectally daily as needed for moderate constipation.   BREO ELLIPTA 100-25 MCG/INH Aepb Generic drug:  fluticasone furoate-vilanterol Inhale 1 puff into the lungs daily.   Cholecalciferol 4000 units Caps Take 1 capsule by mouth daily.   clonazePAM 0.25 MG disintegrating tablet Commonly known as:  KLONOPIN Take 0.25 mg by mouth daily.   COMBIVENT RESPIMAT 20-100 MCG/ACT Aers respimat Generic drug:  Ipratropium-Albuterol Inhale 1 puff into the lungs every 6 (six) hours.   cyanocobalamin 1000 MCG tablet Take 1,000 mcg by mouth daily.   diclofenac sodium 1 % Gel Commonly known as:  VOLTAREN Apply 4 g topically 4 (four) times daily as needed (pain).   diltiazem 60 MG tablet Commonly known as:  CARDIZEM Take 60 mg by mouth daily.   docusate sodium 100 MG capsule Commonly known as:  COLACE Take 1 capsule (100 mg total) by mouth 2 (two) times daily.   ELIQUIS 2.5 MG Tabs tablet Generic drug:  apixaban Take 2.5 mg by mouth 2 (two) times daily.   ferrous sulfate 325 (65 FE) MG tablet Take 1 tablet (325 mg total) by mouth 2 (two) times daily with a meal.   fluticasone 50 MCG/ACT nasal spray Commonly known as:  FLONASE Place 1 spray into both nostrils daily.   furosemide 40 MG tablet Commonly known as:  LASIX Take 40 mg by mouth daily.   HYDROcodone-acetaminophen 5-325 MG tablet Commonly  known as:  NORCO Take 1-2 tablets by mouth every 6 (six) hours as needed for moderate pain.   magnesium hydroxide 400 MG/5ML suspension Commonly known as:  MILK OF MAGNESIA Take 30 mLs by mouth daily as needed for mild constipation.   Melatonin 3 MG Tabs Take 6 mg by mouth at bedtime.   meloxicam 7.5 MG tablet Commonly known as:  MOBIC Take 7.5 mg by mouth 2 (two) times daily.   methocarbamol 500 MG tablet Commonly known as:  ROBAXIN Take 250 mg by mouth 3 (three) times daily.   omeprazole 40 MG capsule Commonly known as:  PRILOSEC Take 40 mg by mouth daily.   oxyCODONE 5 MG immediate release tablet Commonly known as:  ROXICODONE Take 1 tablet (5 mg total) by mouth every 8 (eight)  hours as needed for moderate pain or severe pain.   polyethylene glycol packet Commonly known as:  MIRALAX Take 17 g by mouth 2 (two) times daily.   polyvinyl alcohol-povidone 1.4-0.6 % ophthalmic solution Commonly known as:  HYPOTEARS Place 1 drop into both eyes 4 (four) times daily as needed (dry eyes).   potassium chloride SA 20 MEQ tablet Commonly known as:  K-DUR,KLOR-CON Take 20 mEq by mouth 2 (two) times daily.   predniSONE 20 MG tablet Commonly known as:  DELTASONE Take 1 tablet (20 mg total) by mouth daily with breakfast.   senna 8.6 MG Tabs tablet Commonly known as:  SENOKOT Take 1 tablet (8.6 mg total) by mouth daily.   sertraline 100 MG tablet Commonly known as:  ZOLOFT Take 100 mg by mouth daily.       Review of Systems  Constitutional: Negative for activity change, appetite change, chills, diaphoresis, fatigue, fever and unexpected weight change (weight gain).  HENT: Negative for congestion, ear pain, mouth sores, nosebleeds, postnasal drip, rhinorrhea, sinus pain, sinus pressure, sneezing, sore throat, trouble swallowing and voice change.   Eyes: Negative.   Respiratory: Negative for apnea, cough, choking, chest tightness, shortness of breath and wheezing (chronic-  intermittent).   Cardiovascular: Negative for chest pain, palpitations and leg swelling.  Gastrointestinal: Negative for abdominal distention, abdominal pain, constipation, diarrhea and nausea.  Genitourinary: Negative for difficulty urinating, dysuria, frequency (chronic nocturia) and urgency.  Musculoskeletal: Positive for arthralgias (typical arthritis), back pain (chronic) and myalgias. Negative for gait problem.  Skin: Negative for color change, pallor, rash and wound.  Neurological: Negative for dizziness, tremors, syncope, speech difficulty, weakness, numbness and headaches.  Psychiatric/Behavioral: Negative for agitation and behavioral problems.  All other systems reviewed and are negative.   Immunization History  Administered Date(s) Administered  . Influenza-Unspecified 02/02/2014   Pertinent  Health Maintenance Due  Topic Date Due  . DEXA SCAN  07/01/1992  . PNA vac Low Risk Adult (1 of 2 - PCV13) 07/01/1992  . INFLUENZA VACCINE  12/03/2016   No flowsheet data found. Functional Status Survey:    Vitals:   10/09/16 0540  BP: (!) 131/47  Pulse: 68  Resp: 18  Temp: 98.7 F (37.1 C)  SpO2: 97%  Weight: 121 lb (54.9 kg)   Body mass index is 22.13 kg/m. Physical Exam  Constitutional: She is oriented to person, place, and time. Vital signs are normal. She appears well-developed and well-nourished. She is active and cooperative. She does not appear ill. No distress. Nasal cannula in place.  HENT:  Head: Normocephalic and atraumatic.  Mouth/Throat: Uvula is midline, oropharynx is clear and moist and mucous membranes are normal. Mucous membranes are not pale, not dry and not cyanotic.  Eyes: Conjunctivae, EOM and lids are normal. Pupils are equal, round, and reactive to light.  Neck: Trachea normal, normal range of motion and full passive range of motion without pain. Neck supple. No JVD present. No tracheal deviation, no edema and no erythema present. No thyromegaly  present.  Cardiovascular: Normal rate, regular rhythm, normal heart sounds and intact distal pulses.  Exam reveals no gallop, no distant heart sounds and no friction rub.   No murmur heard. Pulses:      Dorsalis pedis pulses are 1+ on the right side, and 1+ on the left side.  Pulmonary/Chest: Effort normal. No accessory muscle usage. No respiratory distress. She has no decreased breath sounds. She has no wheezes. She has rhonchi (chronic/intermittent) in the right lower field  and the left lower field. She has no rales. She exhibits no tenderness.  Abdominal: Soft. Normal appearance and bowel sounds are normal. She exhibits no distension and no ascites. There is no tenderness.  Musculoskeletal: Normal range of motion. She exhibits no edema or tenderness.       Lumbar back: She exhibits pain.       Back:       Arms: Expected osteoarthritis, stiffness  Neurological: She is alert and oriented to person, place, and time. She has normal strength.  Skin: Skin is warm, dry and intact. No rash noted. She is not diaphoretic. No cyanosis or erythema. No pallor. Nails show no clubbing.  Psychiatric: She has a normal mood and affect. Her speech is normal and behavior is normal. Judgment and thought content normal. Cognition and memory are normal.  Nursing note and vitals reviewed.   Labs reviewed:  Recent Labs  07/21/16 1500 08/22/16 1036 09/04/16 1700 10/09/16 1500  NA 138  --  140 138  K 4.5 4.9 4.7 5.3*  CL 102  --  105 104  CO2 27  --  29 28  GLUCOSE 202*  --  106* 121*  BUN 18  --  25* 26*  CREATININE 1.28*  --  1.18* 1.43*  CALCIUM 8.4*  --  8.7* 9.0  MG  --   --  2.1  --     Recent Labs  07/21/16 1500 09/04/16 1700 10/09/16 1500  AST 19 18 19   ALT 14 15 12*  ALKPHOS 113 109 88  BILITOT 0.7 0.8 0.8  PROT 5.7* 5.7* 5.6*  ALBUMIN 3.7 3.8 3.7    Recent Labs  07/21/16 1500 09/04/16 1700 10/09/16 1500  WBC 11.1* 7.6 8.6  NEUTROABS 9.9* 6.3 7.7*  HGB 11.2* 9.3* 10.8*    HCT 34.1* 28.1* 32.4*  MCV 90.8 90.7 91.5  PLT 207 204 181   Lab Results  Component Value Date   TSH 2.985 09/04/2016   Lab Results  Component Value Date   HGBA1C 5.9 (H) 02/25/2016   Lab Results  Component Value Date   CHOL 127 02/09/2015   HDL 72 02/09/2015   LDLCALC 49 02/09/2015   TRIG 28 02/09/2015   CHOLHDL 1.8 02/09/2015    Significant Diagnostic Results in last 30 days:  No results found.  Assessment/Plan 1. Ileus ([place])  Resolved  OK to resume normal diet   Metoclopramide 5 mg po BID to increase GI motility (recurrent ileus)   2. Chronic midline low back pain, with sciatica presence unspecified  Continue Fentanyl patch to 24 mcg/ hr- change Q 3 days  Continue Mobic 7.5 mg po BID  Continue scheduled Tylenol 650 mg po QID  Continue Methocarbamol 500 mg - 1/2 tablet po TID  Continue Voltaren 1% gel- 4 grams QID to the lumbar spine  Continue Sertraline 125 mg po Q Day for depression and chronic pain  3. Chronic chest wall pain  Continue Aspercreme (Lidocaine) patch 4%- Apply to right ribs daily, remove patch after 12 hours  Family/ staff Communication:   Total Time:  Documentation:  Face to Face:  Family/Phone:   Labs/tests ordered:    Medication list reviewed and assessed for continued appropriateness.  Vikki Ports, NP-C [facility] [phone], [place] [address] Cell Phone (Mon-Fri 8am-5pm):  [phone] On Call:  [phone] & follow prompts after 5pm & weekends Office Phone:  [phone] Office Fax:  [phone]

## 2016-11-13 NOTE — Addendum Note (Signed)
Encounter addended by: Kathyrn Drown, RN on: 11/13/2016 11:57 AM<BR>    Actions taken: Order list changed

## 2016-11-24 ENCOUNTER — Encounter: Payer: Self-pay | Admitting: Gerontology

## 2016-11-24 ENCOUNTER — Non-Acute Institutional Stay (SKILLED_NURSING_FACILITY): Payer: Medicare Other | Admitting: Gerontology

## 2016-11-24 DIAGNOSIS — J449 Chronic obstructive pulmonary disease, unspecified: Secondary | ICD-10-CM | POA: Diagnosis not present

## 2016-11-25 NOTE — Progress Notes (Signed)
Location:   [facility] [place] [facility] [place] Room Number: [place] of Service:  SNF [phone]) Provider:  Toni Arthurs, NP-C  Juluis Pitch, MD  Patient Care Team: Juluis Pitch, MD as PCP - General [facility])  Extended Emergency Contact Information Primary Emergency Contact: [name] Address: [address]          [place], [place] [address] [name] of [place] Phone: [phone] Relation: Daughter Secondary Emergency Contact: Lingerfelt,Brad Address: [address] [place], [place] [address] [name] of [place] Phone: [phone] Relation: [name]  Code Status:  DNR Goals of care: Advanced Directive information Advanced Directives 11/25/2016  Does Patient Have a Medical Advance Directive? Yes  Type of Advance Directive Out of facility DNR (pink MOST or yellow form)  Does patient want to make changes to medical advance directive? No - Patient declined  Would patient like information on creating a medical advance directive? -     Chief Complaint  Patient presents with  . Acute Visit    Follow up on Congestion    HPI:  Pt is a 83 y.o. female seen today for an acute visit for c/o congestion, cough. Pt has chronic issues with cough, congestion, dyspnea and general malaise. She reports the same issues are starting to come back. Pt reports she has had a cough for several days, non-productive. Afebrile. Pt denies n/v/d/f/c/cp/sob/ha/abd pain/dizziness. She also reports BLE 1+ pitting edema. This typically comes and goes but now it's staying even after elevating legs. VSS. No other complaints.    Past Medical History:  Diagnosis Date  . Adenoma of rectum   . Adrenal adenoma   . Anemia   . Anemia, unspecified   . Arthritis   . Ascending aortic aneurysm ([place])    a. s/p repair 07/2014  . Atrophic vaginitis   . Benign neoplasm of colon, unspecified   . COPD (chronic obstructive pulmonary disease) ([place])   . Coronary artery  disease, non-occlusive    a. LHC 06/2014 without evidence of obstructive disease  . Depression   . Diverticulosis   . Dysphagia   . Dysrhythmia   . Essential hypertension    benign  . Frequent UTI   . GERD (gastroesophageal reflux disease)   . Hemiplegia and hemiparesis ([place])    following cerebral infarction affecting left non-dominant side  . Hypertension   . Microscopic hematuria   . Nonrheumatic aortic valve insufficiency   . Osteoporosis    unspecified  . Panic attacks   . Paroxysmal atrial fibrillation (HCC)   . Pernicious anemia   . Renal cyst    CKD STAGE 3  . Stroke ([place]) 07/23/2014   a. post-operative setting in 07/2014 leading to left UE hemiapresis and left LE weakness  . Urge incontinence    Past Surgical History:  Procedure Laterality Date  . ABDOMINAL HYSTERECTOMY  1972  . AORTOILIAC BYPASS    . ASCENDING AORTIC ANEURYSM REPAIR  07/23/2014   07/2014  . BLEPHAROPLASTY    . CATARACT EXTRACTION  2005  . GASTROSTOMY W/ FEEDING TUBE    . INTRAMEDULLARY (IM) NAIL INTERTROCHANTERIC Left 02/26/2016   Procedure: INTRAMEDULLARY (IM) NAIL INTERTROCHANTRIC;  Surgeon: Earnestine Leys, MD;  Location: [facility] ORS;  Service: Orthopedics;  Laterality: Left;  . KYPHOPLASTY N/A 08/26/2016   Procedure: KYPHOPLASTY T12;  Surgeon: Hessie Knows, MD;  Location: [facility] ORS;  Service: Orthopedics;  Laterality: N/A;  . LEFT HEART CATH  06/2014  .  PR ASCEND AORTIC GRAFT INCL VALVE SUSPENSION  07/07/2014   Procedure: ASCENDING AORTA GRAFT, WITH CARDIOPULMONARY BYPASS, WITH OR WITHOUT VALVE SUSPENSION; Surgeon: Dwaine Deter, MD; Location: MAIN OR [facility]; Service: Cardiothoracic  . PR LAP, GASTROTOMY W/O TUBE CONSTR  08/08/2014   Procedure: LAPAROSCOPY, SURGICAL; GASTOSTOMY W/O CONSTRUCTION OF GASTRIC TUBE (EG, STAMM PROCEDURE)(SEPARATE PROCED); Surgeon: Fredrik Rigger, MD; Location: [facility]; Service: Gastrointestinal  . PR PLACE PERCUT GASTROSTOMY TUBE  08/08/2014   Procedure: UGI  ENDO; W/DIRECTED PLCMT PERQ GASTROSTOMY TUBE; Surgeon: Fredrik Rigger, MD; Location: MAIN OR [facility] Service: Gastrointestinal    Allergies  Allergen Reactions  . Iodinated Diagnostic Agents Itching and Swelling  . Nitrofurantoin Diarrhea    Other reaction(s): Unknown  . Solifenacin Other (See Comments)    indigestion  . Ciprofloxacin Other (See Comments) and Rash    Colitis  . Metronidazole Itching and Rash  . Sulfa Antibiotics Itching and Rash    Allergies as of 11/24/2016      Reactions   Iodinated Diagnostic Agents Itching, Swelling   Nitrofurantoin Diarrhea   Other reaction(s): Unknown   Solifenacin Other (See Comments)   indigestion   Ciprofloxacin Other (See Comments), Rash   Colitis   Metronidazole Itching, Rash   Sulfa Antibiotics Itching, Rash      Medication List       Accurate as of 11/24/16 11:59 PM. Always use your most recent med list.          acetaminophen 325 MG tablet Commonly known as:  TYLENOL Take 650 mg by mouth 4 (four) times daily.   albuterol 108 (90 Base) MCG/ACT inhaler Commonly known as:  PROVENTIL HFA;VENTOLIN HFA Inhale 2 puffs into the lungs every 4 (four) hours as needed for wheezing or shortness of breath.   alum & mag hydroxide-simeth 967-893-81 MG/5ML suspension Commonly known as:  MAALOX PLUS Take 30 mLs by mouth every 4 (four) hours as needed.   ASPERCREME LIDOCAINE 4 % Ptch Generic drug:  Lidocaine Apply 1 patch topically daily. Apply to right flank/sore ribs daily. Remove after 12 hours.   atorvastatin 20 MG tablet Commonly known as:  LIPITOR Take 20 mg by mouth at bedtime.   benzonatate 100 MG capsule Commonly known as:  TESSALON Take 100 mg by mouth every 8 (eight) hours as needed for cough.   bisacodyl 10 MG suppository Commonly known as:  DULCOLAX Place 10 mg rectally daily as needed for moderate constipation.   BREO ELLIPTA 100-25 MCG/INH Aepb Generic drug:  fluticasone furoate-vilanterol Inhale 1  puff into the lungs daily.   Cholecalciferol 4000 units Caps Take 1 capsule by mouth daily.   clonazePAM 0.25 MG disintegrating tablet Commonly known as:  KLONOPIN Take 0.25 mg by mouth daily.   cyanocobalamin 500 MCG tablet Take 500 mcg by mouth daily.   diclofenac sodium 1 % Gel Commonly known as:  VOLTAREN Apply 4 g topically 4 (four) times daily as needed (pain). To lumbar spine   diltiazem 60 MG tablet Commonly known as:  CARDIZEM Take 60 mg by mouth daily.   docusate sodium 100 MG capsule Commonly known as:  COLACE Take 1 capsule (100 mg total) by mouth 2 (two) times daily.   ELIQUIS 2.5 MG Tabs tablet Generic drug:  apixaban Take 2.5 mg by mouth 2 (two) times daily.   fentaNYL 25 MCG/HR patch Commonly known as:  DURAGESIC - dosed mcg/hr Place 25 mcg onto the skin every 3 (three) days.   ferrous sulfate 325 (65 FE)  MG tablet Take 1 tablet (325 mg total) by mouth 2 (two) times daily with a meal.   fluticasone 50 MCG/ACT nasal spray Commonly known as:  FLONASE Place 1 spray into both nostrils daily.   furosemide 40 MG tablet Commonly known as:  LASIX Take 40 mg by mouth daily.   gabapentin 100 MG capsule Commonly known as:  NEURONTIN Take 100 mg by mouth 3 (three) times daily.   guaiFENesin 100 MG/5ML liquid Commonly known as:  ROBITUSSIN Take 200 mg by mouth every 4 (four) hours as needed for cough. Notify provider if cough worsens/ continues longer than 3 days   ipratropium-albuterol 0.5-2.5 (3) MG/3ML Soln Commonly known as:  DUONEB Take 3 mLs by nebulization 3 (three) times daily.   magnesium hydroxide 400 MG/5ML suspension Commonly known as:  MILK OF MAGNESIA Take 30 mLs by mouth daily as needed for mild constipation.   meloxicam 7.5 MG tablet Commonly known as:  MOBIC Take 7.5 mg by mouth 2 (two) times daily.   methocarbamol 500 MG tablet Commonly known as:  ROBAXIN Take 250 mg by mouth 3 (three) times daily. 1/2 tablet   metoCLOPramide 5  MG tablet Commonly known as:  REGLAN Take 5 mg by mouth 2 (two) times daily.   omeprazole 40 MG capsule Commonly known as:  PRILOSEC Take 40 mg by mouth daily.   oxycodone 5 MG capsule Commonly known as:  OXY-IR Take 5 mg by mouth every 4 (four) hours as needed. For moderate or severe pain   OXYGEN Inhale 2 L into the lungs continuous as needed.   polyethylene glycol packet Commonly known as:  MIRALAX Take 17 g by mouth 2 (two) times daily.   polyvinyl alcohol-povidone 1.4-0.6 % ophthalmic solution Commonly known as:  HYPOTEARS Place 1 drop into both eyes 4 (four) times daily as needed (dry eyes).   potassium chloride SA 20 MEQ tablet Commonly known as:  K-DUR,KLOR-CON Take 20 mEq by mouth daily.   predniSONE 10 MG tablet Commonly known as:  DELTASONE Take 10 mg by mouth daily with breakfast.   REFRESH LACRI-LUBE Oint Place 1/4 ribbon in both eyes at bedtime for dry eyes   SENNA-PLUS 8.6-50 MG tablet Generic drug:  senna-docusate Take 1 tablet by mouth 2 (two) times daily.   sertraline 100 MG tablet Commonly known as:  ZOLOFT Take 100 mg by mouth daily. 8 pm   sertraline 25 MG tablet Commonly known as:  ZOLOFT Take 25 mg by mouth daily. 8 pm   sodium phosphate Pediatric 3.5-9.5 GM/59ML enema Place 1 enema rectally once as needed for severe constipation. Take once every 3 days if constipation is not relieved by Milk of magnesia or Bisacodyl Suppository   spironolactone 25 MG tablet Commonly known as:  ALDACTONE Take 25 mg by mouth daily.       Review of Systems  Constitutional: Negative for activity change, appetite change, chills, diaphoresis, fatigue, fever and unexpected weight change (weight gain).  HENT: Negative for congestion, ear pain, mouth sores, nosebleeds, postnasal drip, rhinorrhea, sinus pain, sinus pressure, sneezing, sore throat, trouble swallowing and voice change.   Eyes: Negative.   Respiratory: Positive for cough. Negative for apnea,  choking, chest tightness, shortness of breath and wheezing.   Cardiovascular: Positive for leg swelling. Negative for chest pain and palpitations.  Gastrointestinal: Negative for abdominal distention, abdominal pain, constipation, diarrhea and nausea.  Genitourinary: Negative for difficulty urinating, dysuria, frequency (chronic nocturia) and urgency.  Musculoskeletal: Positive for arthralgias (typical arthritis) and  back pain (chronic). Negative for gait problem and myalgias.  Skin: Negative for color change, pallor, rash and wound.  Neurological: Negative for dizziness, tremors, syncope, speech difficulty, weakness, numbness and headaches.  Psychiatric/Behavioral: Negative for agitation and behavioral problems.  All other systems reviewed and are negative.   Immunization History  Administered Date(s) Administered  . Influenza Inj Mdck Quad Pf 02/14/2016  . Influenza-Unspecified 02/02/2014  . PPD Test 08/10/2014, 08/24/2014, 02/20/2016  . Pneumococcal-Unspecified 02/20/2016   Pertinent  Health Maintenance Due  Topic Date Due  . DEXA SCAN  07/01/1992  . PNA vac Low Risk Adult (1 of 2 - PCV13) 07/01/1992  . INFLUENZA VACCINE  12/03/2016   No flowsheet data found. Functional Status Survey:    Vitals:   11/24/16 1538  BP: (!) 140/56  Pulse: 68  Resp: 20  Temp: 98.7 F (37.1 C)  SpO2: 98%  Weight: 129 lb (58.5 kg)  Height: 5\' 2"  (1.575 m)   Body mass index is 23.59 kg/m. Physical Exam  Constitutional: She is oriented to person, place, and time. Vital signs are normal. She appears well-developed and well-nourished. She is active and cooperative. She does not appear ill. No distress.  HENT:  Head: Normocephalic and atraumatic.  Mouth/Throat: Uvula is midline, oropharynx is clear and moist and mucous membranes are normal. Mucous membranes are not pale, not dry and not cyanotic.  Eyes: Pupils are equal, round, and reactive to light. Conjunctivae, EOM and lids are normal.  Neck:  Trachea normal, normal range of motion and full passive range of motion without pain. Neck supple. No JVD present. No tracheal deviation, no edema and no erythema present. No thyromegaly present.  Cardiovascular: Normal rate, regular rhythm, normal heart sounds and intact distal pulses.  Exam reveals no gallop, no distant heart sounds and no friction rub.   No murmur heard. Pulses:      Dorsalis pedis pulses are 1+ on the right side, and 1+ on the left side.  1+ B-pitting edema. TED Hose in place  Pulmonary/Chest: Effort normal. No accessory muscle usage. No respiratory distress. She has no decreased breath sounds. She has wheezes in the right upper field, the right middle field and the left upper field. She has rhonchi in the right upper field, the right middle field and the left upper field. She has rales (mild) in the right upper field, the right middle field and the left upper field. She exhibits no tenderness.  Abdominal: Soft. Normal appearance and bowel sounds are normal. She exhibits no distension and no ascites. There is no tenderness.  Musculoskeletal: Normal range of motion. She exhibits no edema or tenderness.  Expected osteoarthritis, stiffness  Neurological: She is alert and oriented to person, place, and time. She has normal strength.  Skin: Skin is warm, dry and intact. She is not diaphoretic. No cyanosis. No pallor. Nails show no clubbing.  Psychiatric: She has a normal mood and affect. Her speech is normal and behavior is normal. Judgment and thought content normal. Cognition and memory are normal.  Nursing note and vitals reviewed.   Labs reviewed:  Recent Labs  07/21/16 1500 08/22/16 1036 09/04/16 1700 10/09/16 1500  NA 138  --  140 138  K 4.5 4.9 4.7 5.3*  CL 102  --  105 104  CO2 27  --  29 28  GLUCOSE 202*  --  106* 121*  BUN 18  --  25* 26*  CREATININE 1.28*  --  1.18* 1.43*  CALCIUM 8.4*  --  8.7* 9.0  MG  --   --  2.1  --     Recent Labs  07/21/16 1500  09/04/16 1700 10/09/16 1500  AST 19 18 19   ALT 14 15 12*  ALKPHOS 113 109 88  BILITOT 0.7 0.8 0.8  PROT 5.7* 5.7* 5.6*  ALBUMIN 3.7 3.8 3.7    Recent Labs  07/21/16 1500 09/04/16 1700 10/09/16 1500  WBC 11.1* 7.6 8.6  NEUTROABS 9.9* 6.3 7.7*  HGB 11.2* 9.3* 10.8*  HCT 34.1* 28.1* 32.4*  MCV 90.8 90.7 91.5  PLT 207 204 181   Lab Results  Component Value Date   TSH 2.985 09/04/2016   Lab Results  Component Value Date   HGBA1C 5.9 (H) 02/25/2016   Lab Results  Component Value Date   CHOL 127 02/09/2015   HDL 72 02/09/2015   LDLCALC 49 02/09/2015   TRIG 28 02/09/2015   CHOLHDL 1.8 02/09/2015    Significant Diagnostic Results in last 30 days:  No results found.  Assessment/Plan 1. Chronic obstructive pulmonary disease, unspecified COPD type (HCC)  Furosemide 40 mg IM x 1  Continue guaifenesin 10 mL po Q 4 hours prn  Pulmicort 0.5 mg/ 65mL- IH BID x 3 days  Family/ staff Communication:   Total Time:  Documentation:  Face to Face:  Family/Phone:   Labs/tests ordered:    Medication list reviewed and assessed for continued appropriateness.  Vikki Ports, NP-C Geriatrics [facility] [phone], [place] [address] Cell Phone (Mon-Fri 8am-5pm):  [phone] On Call:  [phone] & follow prompts after 5pm & weekends Office Phone:  [phone] Office Fax:  [phone]

## 2016-11-26 ENCOUNTER — Non-Acute Institutional Stay (SKILLED_NURSING_FACILITY): Payer: Medicare Other | Admitting: Gerontology

## 2016-11-26 DIAGNOSIS — R0789 Other chest pain: Secondary | ICD-10-CM

## 2016-11-26 DIAGNOSIS — G8929 Other chronic pain: Secondary | ICD-10-CM | POA: Diagnosis not present

## 2016-11-26 DIAGNOSIS — J449 Chronic obstructive pulmonary disease, unspecified: Secondary | ICD-10-CM | POA: Diagnosis not present

## 2016-11-28 ENCOUNTER — Encounter: Payer: Self-pay | Admitting: Gerontology

## 2016-11-28 NOTE — Progress Notes (Signed)
Location:   [place] [facility] [place] Room Number: [place] of Service:  SNF [phone]) Provider:  Toni Arthurs, NP-C  Juluis Pitch, MD  Patient Care Team: Juluis Pitch, MD as PCP - [facility])  Extended Emergency Contact Information Primary Emergency Contact: [name] Address: [address]          [place], [place] [address] [name] of [place] Phone: [phone] Relation: Daughter Secondary Emergency Contact: Lingerfelt,Brad Address: [address] [place], [place] [address] [name] of [place] Phone: [phone] Relation: [name]  Code Status:  DNR Goals of care: Advanced Directive information Advanced Directives 11/26/2016  Does Patient Have a Medical Advance Directive? Yes  Type of Advance Directive Out of facility DNR (pink MOST or yellow form)  Does patient want to make changes to medical advance directive? No - Patient declined  Would patient like information on creating a medical advance directive? -     Chief Complaint  Patient presents with  . Acute Visit    Follow up on congestion    HPI:  Pt is a 83 y.o. female seen today for an acute visit for worsening congestion. Pt was seen 2 days ago for congestion, cough. Symptoms persist despite treatment. Pt now c/o recurrent rib pain d/t the cough. The edema is near resolved in the legs. Breath sound are unchanged. Pt reports she feels she has something in her chest that she just can't cough up. Otherwise, pt is afebrile, VSS. No other complaints.     Past Medical History:  Diagnosis Date  . Adenoma of rectum   . Adrenal adenoma   . Anemia   . Anemia, unspecified   . Arthritis   . Ascending aortic aneurysm ([place])    a. s/p repair 07/2014  . Atrophic vaginitis   . Benign neoplasm of colon, unspecified   . COPD (chronic obstructive pulmonary disease) ([place])   . Coronary artery disease, non-occlusive    a. LHC 06/2014 without evidence of  obstructive disease  . Depression   . Diverticulosis   . Dysphagia   . Dysrhythmia   . Essential hypertension    benign  . Frequent UTI   . GERD (gastroesophageal reflux disease)   . Hemiplegia and hemiparesis ([place])    following cerebral infarction affecting left non-dominant side  . Hypertension   . Microscopic hematuria   . Nonrheumatic aortic valve insufficiency   . Osteoporosis    unspecified  . Panic attacks   . Paroxysmal atrial fibrillation (HCC)   . Pernicious anemia   . Renal cyst    CKD STAGE 3  . Stroke ([place]) 07/23/2014   a. post-operative setting in 07/2014 leading to left UE hemiapresis and left LE weakness  . Urge incontinence    Past Surgical History:  Procedure Laterality Date  . ABDOMINAL HYSTERECTOMY  1972  . AORTOILIAC BYPASS    . ASCENDING AORTIC ANEURYSM REPAIR  07/23/2014   07/2014  . BLEPHAROPLASTY    . CATARACT EXTRACTION  2005  . GASTROSTOMY W/ FEEDING TUBE    . INTRAMEDULLARY (IM) NAIL INTERTROCHANTERIC Left 02/26/2016   Procedure: INTRAMEDULLARY (IM) NAIL INTERTROCHANTRIC;  Surgeon: Earnestine Leys, MD;  Location: [facility] ORS;  Service: Orthopedics;  Laterality: Left;  . KYPHOPLASTY N/A 08/26/2016   Procedure: KYPHOPLASTY T12;  Surgeon: Hessie Knows, MD;  Location: [facility] ORS;  Service: Orthopedics;  Laterality: N/A;  . LEFT HEART CATH  06/2014  . PR  ASCEND AORTIC GRAFT INCL VALVE SUSPENSION  07/07/2014   Procedure: ASCENDING AORTA GRAFT, WITH CARDIOPULMONARY BYPASS, WITH OR WITHOUT VALVE SUSPENSION; Surgeon: Dwaine Deter, MD; Location: MAIN OR [facility]; Service: Cardiothoracic  . PR LAP, GASTROTOMY W/O TUBE CONSTR  08/08/2014   Procedure: LAPAROSCOPY, SURGICAL; GASTOSTOMY W/O CONSTRUCTION OF GASTRIC TUBE (EG, STAMM PROCEDURE)(SEPARATE PROCED); Surgeon: Fredrik Rigger, MD; Location: MA[facility] OR [facility]; Service: Gastrointestinal  . PR PLACE PERCUT GASTROSTOMY TUBE  08/08/2014   Procedure: UGI ENDO; W/DIRECTED PLCMT PERQ GASTROSTOMY TUBE; Surgeon: Fredrik Rigger, MD; Location: MAIN OR [facility]; Service: Gastrointestinal    Allergies  Allergen Reactions  . Iodinated Diagnostic Agents Itching and Swelling  . Nitrofurantoin Diarrhea    Other reaction(s): Unknown  . Omnipaque [Iohexol]   . Solifenacin Other (See Comments)    indigestion  . Ciprofloxacin Other (See Comments) and Rash    Colitis  . Metronidazole Itching and Rash  . Sulfa Antibiotics Itching and Rash    Allergies as of 11/26/2016      Reactions   Iodinated Diagnostic Agents Itching, Swelling   Nitrofurantoin Diarrhea   Other reaction(s): Unknown   Solifenacin Other (See Comments)   indigestion   Ciprofloxacin Other (See Comments), Rash   Colitis   Metronidazole Itching, Rash   Sulfa Antibiotics Itching, Rash      Medication List       Accurate as of 11/26/16 11:59 PM. Always use your most recent med list.          acetaminophen 325 MG tablet Commonly known as:  TYLENOL Take 650 mg by mouth 4 (four) times daily.   albuterol 108 (90 Base) MCG/ACT inhaler Commonly known as:  PROVENTIL HFA;VENTOLIN HFA Inhale 2 puffs into the lungs every 4 (four) hours as needed for wheezing or shortness of breath.   alum & mag hydroxide-simeth 220-254-27 MG/5ML suspension Commonly known as:  MAALOX PLUS Take 30 mLs by mouth every 4 (four) hours as needed.   ASPERCREME LIDOCAINE 4 % Ptch Generic drug:  Lidocaine Apply 1 patch topically daily. Apply to right flank/sore ribs daily. Remove after 12 hours.   atorvastatin 20 MG tablet Commonly known as:  LIPITOR Take 20 mg by mouth at bedtime.   benzonatate 100 MG capsule Commonly known as:  TESSALON Take 100 mg by mouth every 8 (eight) hours as needed for cough.   BREO ELLIPTA 100-25 MCG/INH Aepb Generic drug:  fluticasone furoate-vilanterol Inhale 1 puff into the lungs daily.   Cholecalciferol 4000 units Caps Take 1 capsule by mouth daily.   clonazePAM 0.25 MG disintegrating tablet Commonly known as:   KLONOPIN Take 0.25 mg by mouth daily.   cyanocobalamin 500 MCG tablet Take 500 mcg by mouth daily.   dextromethorphan-guaiFENesin 30-600 MG 12hr tablet Commonly known as:  MUCINEX DM Take 1 tablet by mouth 2 (two) times daily.   diclofenac sodium 1 % Gel Commonly known as:  VOLTAREN Apply 4 g topically 4 (four) times daily as needed (pain). To lumbar spine   diltiazem 60 MG tablet Commonly known as:  CARDIZEM Take 60 mg by mouth daily.   docusate sodium 100 MG capsule Commonly known as:  COLACE Take 1 capsule (100 mg total) by mouth 2 (two) times daily.   ELIQUIS 2.5 MG Tabs tablet Generic drug:  apixaban Take 2.5 mg by mouth 2 (two) times daily.   fentaNYL 25 MCG/HR patch Commonly known as:  DURAGESIC - dosed mcg/hr Place 25 mcg onto the skin every 3 (three) days.  ferrous sulfate 325 (65 FE) MG tablet Take 1 tablet (325 mg total) by mouth 2 (two) times daily with a meal.   fluticasone 50 MCG/ACT nasal spray Commonly known as:  FLONASE Place 1 spray into both nostrils daily.   furosemide 40 MG tablet Commonly known as:  LASIX Take 40 mg by mouth daily.   gabapentin 100 MG capsule Commonly known as:  NEURONTIN Take 100 mg by mouth 3 (three) times daily.   guaiFENesin 100 MG/5ML liquid Commonly known as:  ROBITUSSIN Take 200 mg by mouth every 4 (four) hours as needed for cough. Notify provider if cough worsens/ continues longer than 3 days   ipratropium-albuterol 0.5-2.5 (3) MG/3ML Soln Commonly known as:  DUONEB Take 3 mLs by nebulization 3 (three) times daily.   magnesium hydroxide 400 MG/5ML suspension Commonly known as:  MILK OF MAGNESIA Take 30 mLs by mouth daily as needed for mild constipation.   meloxicam 7.5 MG tablet Commonly known as:  MOBIC Take 7.5 mg by mouth 2 (two) times daily.   methocarbamol 500 MG tablet Commonly known as:  ROBAXIN Take 250 mg by mouth 3 (three) times daily. 1/2 tablet   metoCLOPramide 5 MG tablet Commonly known as:   REGLAN Take 5 mg by mouth 2 (two) times daily.   omeprazole 40 MG capsule Commonly known as:  PRILOSEC Take 40 mg by mouth daily.   oxycodone 5 MG capsule Commonly known as:  OXY-IR Take 5 mg by mouth every 4 (four) hours as needed. For moderate or severe pain   OXYGEN Inhale 2 L into the lungs continuous as needed.   polyethylene glycol packet Commonly known as:  MIRALAX Take 17 g by mouth 2 (two) times daily.   polyvinyl alcohol-povidone 1.4-0.6 % ophthalmic solution Commonly known as:  HYPOTEARS Place 1 drop into both eyes 4 (four) times daily as needed (dry eyes).   potassium chloride SA 20 MEQ tablet Commonly known as:  K-DUR,KLOR-CON Take 20 mEq by mouth daily.   predniSONE 10 MG tablet Commonly known as:  DELTASONE Take 10 mg by mouth daily with breakfast.   PULMICORT 0.5 MG/2ML nebulizer solution Generic drug:  budesonide Take 0.5 mg by nebulization 2 (two) times daily.   REFRESH LACRI-LUBE Oint Place 1/4 ribbon in both eyes at bedtime for dry eyes   SENNA-PLUS 8.6-50 MG tablet Generic drug:  senna-docusate Take 1 tablet by mouth 2 (two) times daily as needed.   sertraline 100 MG tablet Commonly known as:  ZOLOFT Take 100 mg by mouth daily. 8 pm   sertraline 50 MG tablet Commonly known as:  ZOLOFT Take 50 mg by mouth at bedtime. 8 pm   sodium phosphate Pediatric 3.5-9.5 GM/59ML enema Place 1 enema rectally once as needed for severe constipation. Take once every 3 days if constipation is not relieved by Milk of magnesia or Bisacodyl Suppository   spironolactone 25 MG tablet Commonly known as:  ALDACTONE Take 25 mg by mouth daily.       Review of Systems  Constitutional: Negative for activity change, appetite change, chills, diaphoresis, fatigue, fever and unexpected weight change (weight gain).  HENT: Negative for congestion, ear pain, mouth sores, nosebleeds, postnasal drip, rhinorrhea, sinus pain, sinus pressure, sneezing, sore throat, trouble  swallowing and voice change.   Eyes: Negative.   Respiratory: Positive for cough. Negative for apnea, choking, chest tightness, shortness of breath and wheezing.   Cardiovascular: Positive for leg swelling. Negative for chest pain and palpitations.  Gastrointestinal: Negative for  abdominal distention, abdominal pain, constipation, diarrhea and nausea.  Genitourinary: Negative for difficulty urinating, dysuria, frequency (chronic nocturia) and urgency.  Musculoskeletal: Positive for arthralgias (typical arthritis) and back pain (chronic). Negative for gait problem and myalgias.  Skin: Negative for color change, pallor, rash and wound.  Neurological: Negative for dizziness, tremors, syncope, speech difficulty, weakness, numbness and headaches.  Psychiatric/Behavioral: Negative for agitation and behavioral problems.  All other systems reviewed and are negative.   Immunization History  Administered Date(s) Administered  . Influenza Inj Mdck Quad Pf 02/14/2016  . Influenza-Unspecified 02/02/2014  . PPD Test 08/10/2014, 08/24/2014, 02/20/2016  . Pneumococcal-Unspecified 02/20/2016   Pertinent  Health Maintenance Due  Topic Date Due  . DEXA SCAN  07/01/1992  . INFLUENZA VACCINE  12/03/2016  . PNA vac Low Risk Adult (2 of 2 - PCV13) 02/19/2017   No flowsheet data found. Functional Status Survey:    Vitals:   11/26/16 1027  BP: (!) 162/75  Pulse: 65  Resp: 19  Temp: 98.5 F (36.9 C)  SpO2: 98%  Weight: 128 lb 4.8 oz (58.2 kg)  Height: 5\' 2"  (1.575 m)   Body mass index is 23.47 kg/m. Physical Exam  Constitutional: She is oriented to person, place, and time. Vital signs are normal. She appears well-developed and well-nourished. She is active and cooperative. She does not appear ill. No distress.  HENT:  Head: Normocephalic and atraumatic.  Mouth/Throat: Uvula is midline, oropharynx is clear and moist and mucous membranes are normal. Mucous membranes are not pale, not dry and not  cyanotic.  Eyes: Pupils are equal, round, and reactive to light. Conjunctivae, EOM and lids are normal.  Neck: Trachea normal, normal range of motion and full passive range of motion without pain. Neck supple. No JVD present. No tracheal deviation, no edema and no erythema present. No thyromegaly present.  Cardiovascular: Normal rate, regular rhythm, normal heart sounds and intact distal pulses.  Exam reveals no gallop, no distant heart sounds and no friction rub.   No murmur heard. Pulses:      Dorsalis pedis pulses are 1+ on the right side, and 1+ on the left side.  1+ B-pitting edema. TED Hose in place  Pulmonary/Chest: Effort normal. No accessory muscle usage. No respiratory distress. She has no decreased breath sounds. She has wheezes in the right upper field, the right middle field and the left upper field. She has rhonchi in the right upper field, the right middle field and the left upper field. She has rales (mild) in the right upper field, the right middle field and the left upper field. She exhibits no tenderness.  Abdominal: Soft. Normal appearance and bowel sounds are normal. She exhibits no distension and no ascites. There is no tenderness.  Musculoskeletal: Normal range of motion. She exhibits no edema or tenderness.  Expected osteoarthritis, stiffness  Neurological: She is alert and oriented to person, place, and time. She has normal strength.  Skin: Skin is warm, dry and intact. She is not diaphoretic. No cyanosis. No pallor. Nails show no clubbing.  Psychiatric: She has a normal mood and affect. Her speech is normal and behavior is normal. Judgment and thought content normal. Cognition and memory are normal.  Nursing note and vitals reviewed.   Labs reviewed:  Recent Labs  07/21/16 1500 08/22/16 1036 09/04/16 1700 10/09/16 1500  NA 138  --  140 138  K 4.5 4.9 4.7 5.3*  CL 102  --  105 104  CO2 27  --  29  28  GLUCOSE 202*  --  106* 121*  BUN 18  --  25* 26*  CREATININE  1.28*  --  1.18* 1.43*  CALCIUM 8.4*  --  8.7* 9.0  MG  --   --  2.1  --     Recent Labs  07/21/16 1500 09/04/16 1700 10/09/16 1500  AST 19 18 19   ALT 14 15 12*  ALKPHOS 113 109 88  BILITOT 0.7 0.8 0.8  PROT 5.7* 5.7* 5.6*  ALBUMIN 3.7 3.8 3.7    Recent Labs  07/21/16 1500 09/04/16 1700 10/09/16 1500  WBC 11.1* 7.6 8.6  NEUTROABS 9.9* 6.3 7.7*  HGB 11.2* 9.3* 10.8*  HCT 34.1* 28.1* 32.4*  MCV 90.8 90.7 91.5  PLT 207 204 181   Lab Results  Component Value Date   TSH 2.985 09/04/2016   Lab Results  Component Value Date   HGBA1C 5.9 (H) 02/25/2016   Lab Results  Component Value Date   CHOL 127 02/09/2015   HDL 72 02/09/2015   LDLCALC 49 02/09/2015   TRIG 28 02/09/2015   CHOLHDL 1.8 02/09/2015    Significant Diagnostic Results in last 30 days:  No results found.  Assessment/Plan 1. Chronic obstructive pulmonary disease, unspecified COPD type (HCC)  Schedule Mucinex DM 1 tablet po BID  Pulmicort 0.5 mg/ 33mL- IH BID x 1 more day  Increase Spironolactone 50 mg po Q Day  2. Chronic chest wall pain  Continue Aspercreme (Lidocaine) patch 4%- Apply to right ribs daily, remove patch after 12 hours  Increase Gabapentin 200 mg po TID  Other chronic pain  Continue Fentanyl 25 mcg patch Q 3 days   Continue Oxycodone 5 mg po Q 4 hours prn  Continue Methocarbamol 500 mg 1/2 tablet po TID  Continue Tylenol 650 mg po QID  Increase Sertraline to 150 mg po Q Day  Family/ staff Communication:   Total Time:  Documentation:  Face to Face:  Family/Phone:   Labs/tests ordered:    Medication list reviewed and assessed for continued appropriateness.  Vikki Ports, NP-C Geriatrics [facility] [phone], [place] [address] Cell Phone (Mon-Fri 8am-5pm):  [phone] On Call:  [phone] & follow prompts after 5pm & weekends Office Phone:  [phone] Office Fax:  [phone]

## 2016-12-03 ENCOUNTER — Encounter
Admission: RE | Admit: 2016-12-03 | Discharge: 2016-12-03 | Disposition: A | Discharge status: Home / Self Care | Payer: Medicare Other | Source: Ambulatory Visit | Attending: Internal Medicine | Admitting: Internal Medicine

## 2016-12-03 ENCOUNTER — Encounter: Payer: Self-pay | Admitting: Gerontology

## 2016-12-03 ENCOUNTER — Non-Acute Institutional Stay (SKILLED_NURSING_FACILITY): Payer: Medicare Other | Admitting: Gerontology

## 2016-12-03 DIAGNOSIS — G510 Bell's palsy: Secondary | ICD-10-CM

## 2016-12-03 DIAGNOSIS — R1312 Dysphagia, oropharyngeal phase: Secondary | ICD-10-CM | POA: Diagnosis not present

## 2016-12-03 DIAGNOSIS — Z87891 Personal history of nicotine dependence: Secondary | ICD-10-CM

## 2016-12-03 DIAGNOSIS — J309 Allergic rhinitis, unspecified: Secondary | ICD-10-CM

## 2016-12-03 DIAGNOSIS — I69391 Dysphagia following cerebral infarction: Secondary | ICD-10-CM | POA: Diagnosis not present

## 2016-12-03 DIAGNOSIS — Z9181 History of falling: Secondary | ICD-10-CM

## 2016-12-03 DIAGNOSIS — I129 Hypertensive chronic kidney disease with stage 1 through stage 4 chronic kidney disease, or unspecified chronic kidney disease: Secondary | ICD-10-CM

## 2016-12-03 DIAGNOSIS — I480 Paroxysmal atrial fibrillation: Secondary | ICD-10-CM

## 2016-12-03 DIAGNOSIS — I351 Nonrheumatic aortic (valve) insufficiency: Secondary | ICD-10-CM

## 2016-12-03 DIAGNOSIS — G8929 Other chronic pain: Secondary | ICD-10-CM

## 2016-12-03 DIAGNOSIS — F419 Anxiety disorder, unspecified: Secondary | ICD-10-CM | POA: Diagnosis not present

## 2016-12-03 DIAGNOSIS — J449 Chronic obstructive pulmonary disease, unspecified: Secondary | ICD-10-CM

## 2016-12-03 DIAGNOSIS — F325 Major depressive disorder, single episode, in full remission: Secondary | ICD-10-CM

## 2016-12-03 DIAGNOSIS — D510 Vitamin B12 deficiency anemia due to intrinsic factor deficiency: Secondary | ICD-10-CM

## 2016-12-03 DIAGNOSIS — E785 Hyperlipidemia, unspecified: Secondary | ICD-10-CM

## 2016-12-03 DIAGNOSIS — G4700 Insomnia, unspecified: Secondary | ICD-10-CM

## 2016-12-03 DIAGNOSIS — N183 Chronic kidney disease, stage 3 unspecified: Secondary | ICD-10-CM

## 2016-12-03 DIAGNOSIS — K219 Gastro-esophageal reflux disease without esophagitis: Secondary | ICD-10-CM

## 2016-12-03 DIAGNOSIS — H04123 Dry eye syndrome of bilateral lacrimal glands: Secondary | ICD-10-CM

## 2016-12-03 DIAGNOSIS — I69354 Hemiplegia and hemiparesis following cerebral infarction affecting left non-dominant side: Secondary | ICD-10-CM | POA: Diagnosis not present

## 2016-12-03 DIAGNOSIS — R41841 Cognitive communication deficit: Secondary | ICD-10-CM

## 2016-12-03 DIAGNOSIS — I7121 Aneurysm of the ascending aorta, without rupture: Secondary | ICD-10-CM

## 2016-12-03 DIAGNOSIS — R0789 Other chest pain: Secondary | ICD-10-CM

## 2016-12-03 DIAGNOSIS — I712 Thoracic aortic aneurysm, without rupture: Secondary | ICD-10-CM

## 2016-12-03 DIAGNOSIS — M810 Age-related osteoporosis without current pathological fracture: Secondary | ICD-10-CM

## 2016-12-03 DIAGNOSIS — K3184 Gastroparesis: Secondary | ICD-10-CM

## 2016-12-03 DIAGNOSIS — K30 Functional dyspepsia: Secondary | ICD-10-CM

## 2016-12-03 DIAGNOSIS — I10 Essential (primary) hypertension: Secondary | ICD-10-CM

## 2016-12-03 NOTE — Progress Notes (Signed)
Location:   [facility] Room Number: [place] of Service:  SNF [id number]) Provider:  Toni Arthurs, NP-C  Juluis Pitch, MD  Patient Care Team: Juluis Pitch, MD as PCP - [facility])  Extended Emergency Contact Information Primary Emergency Contact: [name] Address: [address]          [place], [place] [address] [name] of [place] Phone: [phone] Relation: Daughter Secondary Emergency Contact: Lingerfelt,Brad Address: [place], [place] [address] [name] of [place] Phone: [phone] Relation: [name]  Code Status:  Full Goals of care: Advanced Directive information Advanced Directives 12/03/2016  Does Patient Have a Medical Advance Directive? Yes  Type of Advance Directive Out of facility DNR (pink MOST or yellow form)  Does patient want to make changes to medical advance directive? No - Patient declined  Would patient like information on creating a medical advance directive? -     Chief Complaint  Patient presents with  . Medical Management of Chronic Issues    Routine Visit    HPI:  Pt is a 83 y.o. female seen today for medical management of chronic diseases. Pt has multiple medical issues, many related to pain. Pt is on chronic PO steroids and inhalers for COPD and Bells Palsy. As a result, pt has had multiple wedge compression fractures of the lumbar vertebral spine.  Pt reports her appetite is good, having regular BMs, voiding without difficulty. She is mobile in the wheelchair. She does socialize with other residents consistently .She seems to enjoy working the puzzles. Pt has not had any falls this past month. She had a flares of COPD several weeks ago, that has resolved.  Aside from the chronic pain- which has improved, pt has no complaints at this time. Pt denies n/v/d/f/c/cp/sob/ha/abd pain/dizziness/cough. VSS. No other complaints.     Past Medical History:    Diagnosis Date  . Adenoma of rectum   . Adrenal adenoma   . Anemia   . Anemia, unspecified   . Arthritis   . Ascending aortic aneurysm ([place])    a. s/p repair 07/2014  . Atrophic vaginitis   . Benign neoplasm of colon, unspecified   . COPD (chronic obstructive pulmonary disease) ([place])   . Coronary artery disease, non-occlusive    a. LHC 06/2014 without evidence of obstructive disease  . Depression   . Diverticulosis   . Dysphagia   . Dysrhythmia   . Essential hypertension    benign  . Frequent UTI   . GERD (gastroesophageal reflux disease)   . Hemiplegia and hemiparesis ([place])    following cerebral infarction affecting left non-dominant side  . Hypertension   . Microscopic hematuria   . Nonrheumatic aortic valve insufficiency   . Osteoporosis    unspecified  . Panic attacks   . Paroxysmal atrial fibrillation (HCC)   . Pernicious anemia   . Renal cyst    CKD STAGE 3  . Stroke ([place]) 07/23/2014   a. post-operative setting in 07/2014 leading to left UE hemiapresis and left LE weakness  . Urge incontinence    Past Surgical History:  Procedure Laterality Date  . ABDOMINAL HYSTERECTOMY  1972  . AORTOILIAC BYPASS    . ASCENDING AORTIC ANEURYSM REPAIR  07/23/2014   07/2014  . BLEPHAROPLASTY    . CATARACT EXTRACTION  2005  . GASTROSTOMY W/ FEEDING TUBE    . INTRAMEDULLARY (IM) NAIL INTERTROCHANTERIC Left  02/26/2016   Procedure: INTRAMEDULLARY (IM) NAIL INTERTROCHANTRIC;  Surgeon: Earnestine Leys, MD;  Location: [facility] ORS;  Service: Orthopedics;  Laterality: Left;  . KYPHOPLASTY N/A 08/26/2016   Procedure: KYPHOPLASTY T12;  Surgeon: Hessie Knows, MD;  Location: [facility] ORS;  Service: Orthopedics;  Laterality: N/A;  . LEFT HEART CATH  06/2014  . PR ASCEND AORTIC GRAFT INCL VALVE SUSPENSION  07/07/2014   Procedure: ASCENDING AORTA GRAFT, WITH CARDIOPULMONARY BYPASS, WITH OR WITHOUT VALVE SUSPENSION; Surgeon: Dwaine Deter, MD; Location: MAIN OR [facility]; Service: Cardiothoracic  . PR LAP,  GASTROTOMY W/O TUBE CONSTR  08/08/2014   Procedure: LAPAROSCOPY, SURGICAL; GASTOSTOMY W/O CONSTRUCTION OF GASTRIC TUBE (EG, STAMM PROCEDURE)(SEPARATE PROCED); Surgeon: Fredrik Rigger, MD; Location: MAIN OR [facility]; Service: Gastrointestinal  . PR PLACE PERCUT GASTROSTOMY TUBE  08/08/2014   Procedure: UGI ENDO; W/DIRECTED PLCMT PERQ GASTROSTOMY TUBE; Surgeon: Fredrik Rigger, MD; Location: MAIN OR [facility]; Service: Gastrointestinal    Allergies  Allergen Reactions  . Iodinated Diagnostic Agents Itching and Swelling  . Nitrofurantoin Diarrhea    Other reaction(s): Unknown  . Omnipaque [Iohexol]   . Solifenacin Other (See Comments)    indigestion  . Ciprofloxacin Other (See Comments) and Rash    Colitis  . Metronidazole Itching and Rash  . Sulfa Antibiotics Itching and Rash    Allergies as of 12/03/2016      Reactions   Iodinated Diagnostic Agents Itching, Swelling   Nitrofurantoin Diarrhea   Other reaction(s): Unknown   Omnipaque [iohexol]    Solifenacin Other (See Comments)   indigestion   Ciprofloxacin Other (See Comments), Rash   Colitis   Metronidazole Itching, Rash   Sulfa Antibiotics Itching, Rash      Medication List       Accurate as of 12/03/16  9:47 AM. Always use your most recent med list.          acetaminophen 325 MG tablet Commonly known as:  TYLENOL Take 650 mg by mouth 4 (four) times daily.   albuterol 108 (90 Base) MCG/ACT inhaler Commonly known as:  PROVENTIL HFA;VENTOLIN HFA Inhale 2 puffs into the lungs every 4 (four) hours as needed for wheezing or shortness of breath.   alum & mag hydroxide-simeth 983-382-50 MG/5ML suspension Commonly known as:  MAALOX PLUS Take 30 mLs by mouth every 4 (four) hours as needed.   ASPERCREME LIDOCAINE 4 % Ptch Generic drug:  Lidocaine Apply 1 patch topically daily. Apply to right flank/sore ribs daily. Remove after 12 hours.   atorvastatin 20 MG tablet Commonly known as:  LIPITOR Take 20 mg by mouth  at bedtime.   benzonatate 100 MG capsule Commonly known as:  TESSALON Take 100 mg by mouth every 8 (eight) hours as needed for cough.   BREO ELLIPTA 100-25 MCG/INH Aepb Generic drug:  fluticasone furoate-vilanterol Inhale 1 puff into the lungs daily.   Cholecalciferol 4000 units Caps Take 1 capsule by mouth daily.   clonazePAM 0.25 MG disintegrating tablet Commonly known as:  KLONOPIN Take 0.25 mg by mouth daily.   cyanocobalamin 500 MCG tablet Take 500 mcg by mouth daily.   dextromethorphan-guaiFENesin 30-600 MG 12hr tablet Commonly known as:  MUCINEX DM Take 1 tablet by mouth 2 (two) times daily.   diclofenac sodium 1 % Gel Commonly known as:  VOLTAREN Apply 4 g topically 4 (four) times daily as needed (pain). To lumbar spine   diltiazem 60 MG tablet Commonly known as:  CARDIZEM Take 60 mg by mouth daily.   docusate sodium 100 MG  capsule Commonly known as:  COLACE Take 1 capsule (100 mg total) by mouth 2 (two) times daily.   ELIQUIS 2.5 MG Tabs tablet Generic drug:  apixaban Take 2.5 mg by mouth 2 (two) times daily.   fentaNYL 25 MCG/HR patch Commonly known as:  DURAGESIC - dosed mcg/hr Place 25 mcg onto the skin every 3 (three) days.   ferrous sulfate 325 (65 FE) MG tablet Take 1 tablet (325 mg total) by mouth 2 (two) times daily with a meal.   fluticasone 50 MCG/ACT nasal spray Commonly known as:  FLONASE Place 1 spray into both nostrils daily.   furosemide 40 MG tablet Commonly known as:  LASIX Take 40 mg by mouth daily.   gabapentin 100 MG capsule Commonly known as:  NEURONTIN Take 100 mg by mouth 3 (three) times daily.   ipratropium-albuterol 0.5-2.5 (3) MG/3ML Soln Commonly known as:  DUONEB Take 3 mLs by nebulization 3 (three) times daily.   magnesium hydroxide 400 MG/5ML suspension Commonly known as:  MILK OF MAGNESIA Take 30 mLs by mouth daily as needed for mild constipation.   meloxicam 7.5 MG tablet Commonly known as:  MOBIC Take 7.5  mg by mouth 2 (two) times daily.   methocarbamol 500 MG tablet Commonly known as:  ROBAXIN Take 250 mg by mouth 3 (three) times daily. 1/2 tablet   metoCLOPramide 5 MG tablet Commonly known as:  REGLAN Take 5 mg by mouth 2 (two) times daily.   omeprazole 40 MG capsule Commonly known as:  PRILOSEC Take 40 mg by mouth daily.   oxycodone 5 MG capsule Commonly known as:  OXY-IR Take 5 mg by mouth every 4 (four) hours as needed. For moderate or severe pain   OXYGEN Inhale 2 L into the lungs continuous as needed.   polyethylene glycol packet Commonly known as:  MIRALAX Take 17 g by mouth 2 (two) times daily.   polyvinyl alcohol-povidone 1.4-0.6 % ophthalmic solution Commonly known as:  HYPOTEARS Place 1 drop into both eyes 4 (four) times daily as needed (dry eyes).   potassium chloride SA 20 MEQ tablet Commonly known as:  K-DUR,KLOR-CON Take 20 mEq by mouth daily.   predniSONE 10 MG tablet Commonly known as:  DELTASONE Take 10 mg by mouth daily with breakfast.   REFRESH LACRI-LUBE Oint Place 1/4 ribbon in both eyes at bedtime for dry eyes   SENNA-PLUS 8.6-50 MG tablet Generic drug:  senna-docusate Take 2 tablets by mouth 2 (two) times daily as needed.   sertraline 100 MG tablet Commonly known as:  ZOLOFT Take 100 mg by mouth daily. 8 pm   sertraline 50 MG tablet Commonly known as:  ZOLOFT Take 50 mg by mouth at bedtime. 8 pm   sodium phosphate Pediatric 3.5-9.5 GM/59ML enema Place 1 enema rectally once as needed for severe constipation. Take once every 3 days if constipation is not relieved by Milk of magnesia or Bisacodyl Suppository   spironolactone 50 MG tablet Commonly known as:  ALDACTONE Take 50 mg by mouth daily.       Review of Systems  Constitutional: Negative for activity change, appetite change, chills, diaphoresis, fatigue, fever and unexpected weight change (weight gain).  HENT: Negative for congestion, ear pain, mouth sores, nosebleeds,  postnasal drip, rhinorrhea, sinus pain, sinus pressure, sneezing, sore throat, trouble swallowing and voice change.   Eyes: Negative.   Respiratory: Negative for apnea, cough, choking, chest tightness, shortness of breath and wheezing.   Cardiovascular: Negative for chest pain, palpitations and  leg swelling.  Gastrointestinal: Negative for abdominal distention, abdominal pain, constipation, diarrhea and nausea.  Genitourinary: Negative for difficulty urinating, dysuria, frequency (chronic nocturia) and urgency.  Musculoskeletal: Positive for arthralgias (typical arthritis) and back pain (chronic). Negative for gait problem and myalgias.  Skin: Negative for color change, pallor, rash and wound.  Neurological: Negative for dizziness, tremors, syncope, speech difficulty, weakness, numbness and headaches.  Psychiatric/Behavioral: Negative for agitation and behavioral problems.  All other systems reviewed and are negative.   Immunization History  Administered Date(s) Administered  . Influenza Inj Mdck Quad Pf 02/14/2016  . Influenza-Unspecified 02/02/2014  . PPD Test 08/10/2014, 08/24/2014, 02/20/2016  . Pneumococcal-Unspecified 02/20/2016   Pertinent  Health Maintenance Due  Topic Date Due  . DEXA SCAN  07/01/1992  . INFLUENZA VACCINE  12/03/2016  . PNA vac Low Risk Adult (2 of 2 - PCV13) 02/19/2017   No flowsheet data found. Functional Status Survey:    Vitals:   12/03/16 0936  BP: (!) 135/51  Pulse: 70  Resp: 18  Temp: 97.9 F (36.6 C)  SpO2: 96%  Weight: 127 lb 4.8 oz (57.7 kg)  Height: 5\' 2"  (1.575 m)   Body mass index is 23.28 kg/m. Physical Exam  Constitutional: She is oriented to person, place, and time. Vital signs are normal. She appears well-developed and well-nourished. She is active and cooperative. She does not appear ill. No distress.  HENT:  Head: Normocephalic and atraumatic.  Mouth/Throat: Uvula is midline, oropharynx is clear and moist and mucous membranes  are normal. Mucous membranes are not pale, not dry and not cyanotic.  Eyes: Pupils are equal, round, and reactive to light. Conjunctivae, EOM and lids are normal.  Neck: Trachea normal, normal range of motion and full passive range of motion without pain. Neck supple. No JVD present. No tracheal deviation, no edema and no erythema present. No thyromegaly present.  Cardiovascular: Normal rate, regular rhythm, normal heart sounds and intact distal pulses.  Exam reveals no gallop, no distant heart sounds and no friction rub.   No murmur heard. Pulses:      Dorsalis pedis pulses are 1+ on the right side, and 1+ on the left side.  No edema. TED Hose in place  Pulmonary/Chest: Effort normal. No accessory muscle usage. No respiratory distress. She has no decreased breath sounds. She has no wheezes. She has no rhonchi. She has no rales. She exhibits no tenderness.  Abdominal: Soft. Normal appearance and bowel sounds are normal. She exhibits no distension and no ascites. There is no tenderness.  Musculoskeletal: Normal range of motion. She exhibits no edema or tenderness.  Expected osteoarthritis, stiffness  Neurological: She is alert and oriented to person, place, and time. She has normal strength.  Skin: Skin is warm, dry and intact. No rash noted. She is not diaphoretic. No cyanosis or erythema. No pallor. Nails show no clubbing.  Psychiatric: She has a normal mood and affect. Her speech is normal and behavior is normal. Judgment and thought content normal. Cognition and memory are normal.  Nursing note and vitals reviewed.   Labs reviewed:  Recent Labs  07/21/16 1500 08/22/16 1036 09/04/16 1700 10/09/16 1500  NA 138  --  140 138  K 4.5 4.9 4.7 5.3*  CL 102  --  105 104  CO2 27  --  29 28  GLUCOSE 202*  --  106* 121*  BUN 18  --  25* 26*  CREATININE 1.28*  --  1.18* 1.43*  CALCIUM 8.4*  --  8.7* 9.0  MG  --   --  2.1  --     Recent Labs  07/21/16 1500 09/04/16 1700 10/09/16 1500    AST 19 18 19   ALT 14 15 12*  ALKPHOS 113 109 88  BILITOT 0.7 0.8 0.8  PROT 5.7* 5.7* 5.6*  ALBUMIN 3.7 3.8 3.7    Recent Labs  07/21/16 1500 09/04/16 1700 10/09/16 1500  WBC 11.1* 7.6 8.6  NEUTROABS 9.9* 6.3 7.7*  HGB 11.2* 9.3* 10.8*  HCT 34.1* 28.1* 32.4*  MCV 90.8 90.7 91.5  PLT 207 204 181   Lab Results  Component Value Date   TSH 2.985 09/04/2016   Lab Results  Component Value Date   HGBA1C 5.9 (H) 02/25/2016   Lab Results  Component Value Date   CHOL 127 02/09/2015   HDL 72 02/09/2015   LDLCALC 49 02/09/2015   TRIG 28 02/09/2015   CHOLHDL 1.8 02/09/2015    Significant Diagnostic Results in last 30 days:  No results found.  Assessment/Plan 1. Hemiplegia and hemiparesis following cerebral infarction affecting left non-dominant side (HCC)  Stable  Hand splints   Mobile with wheelchair  2. Hx of falling  Stable   3. Dysphagia following cerebral infarction  Stable   4. Dysphagia, oropharyngeal  Stable   5. Cognitive communication deficit  Stable   6. Hypertensive chronic kidney disease w stg 1-4/unsp chr kdny  Stable  Renally adjust meds as appropriate  7. Chronic kidney disease, stage III (moderate)  Stable  Renally adjust meds as appropriate  8. Vitamin B12 deficiency anemia due to intrinsic factor deficiency  Stable  Continue Cyanocobalamin 500 mcg po Q Day  9. Personal history of nicotine dependence  Resolved   10. Anxiety disorder, unspecified type  Stable  Continue Clonazepam 0.25 mg po Q Day  11. Insomnia, unspecified type  Stable  Monitoring for needs   12. 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 Dry eye syndrome of both lacrimal glands  Stable  Artificial tears- ointment in the Left eye Q HS  Continue Hypotears- 1 drop in each eye QID  14. Allergic rhinitis, unspecified seasonality, unspecified  trigger  Stable  Continue Fluticasone 50 mcg - 1 spray per nostril Q Day  15. Bell's palsy  Stable  Artificial tears- ointment in the Left eye Q HS  16. Nonrheumatic aortic valve insufficiency  Stable   17. Aneurysm, ascending aorta (HCC)  Stable   18. Benign essential HTN  Stable  Continue Furosemide 40 mg po Q Day  Continue Klor-Con 20 meq po BID  Spirolalactone 50 mg po Q Day  19. Paroxysmal atrial fibrillation (HCC)  Stable  Continue Cardizem 60 mg po Q Day  Continue Eliquis 2.5 mg po BID  20. Chronic obstructive pulmonary disease, unspecified COPD type (HCC)  Stable  Continue Tessalon Perles 100 mg po Q 8 hrs PRN  Continue Breo-Ellipta 100-25 mcg- 1 inhalation Q Day  Continue Combivent 20-200 QID  Proventil 90 mcg- 2 puffs Q 4 hours prn  Proventil 90 mcg- 2 puffs TID x 7 days  21. Age related osteoporosis, unspecified pathological fracture presence  Not- stable  Recent multi-level Kyphoplasty for compression fractures  Cholecalciferol 4,000 units po Q Day  22. Other chronic pain  Stable  Continue Tylenol 650 mg po QID  Continue Oxycodone 5 mg po Q 4 hours prn  Continue Methocarbamol 500 mg- 1/2 tablet po TID  for spasms  Continue meloxicam 7.5 mg po Q 12 hours- severe pain  Voltaren gel 1%- apply 4 grams to the lumbar spine QID prn  Fentanyl 25 mcg patch- Q 3 days for pain # 5  23. Hyperlipidemia, unspecified hyperlipidemia type  Stable  Continue Atorvastatin 20 mg po Q HS  24. [place]ison anemia  Stable  Continue Ferrous Sulfate 325 mg po BID  25. Gastroesophageal Reflux disease without esophagitis  Stable  Continue Omeprazole 40 mg po Q Day  26. Chronic chest wall pain  Continue Aspercreme (Lidocaine) patch 4%- Apply to right ribs daily, remove patch after 12 hours  Increase Gabapentin 200 mg po TID  27. Decreased motility of the stomach  Metoclopramide 5 mg po BID for recurrent ileus  Family/ staff  Communication:   Total Time:  Documentation:  Face to Face:  Family/Phone:   Labs/tests ordered:    Medication list reviewed and assessed for continued appropriateness. Monthly medication orders reviewed and signed.  Vikki Ports, NP-C Geriatrics [facility] [phone], [place] [address] Cell Phone (Mon-Fri 8am-5pm):  [phone] On Call:  [phone] & follow prompts after 5pm & weekends Office Phone:  [phone] Office Fax:  [phone]

## 2016-12-04 ENCOUNTER — Other Ambulatory Visit: Payer: Self-pay

## 2016-12-04 DIAGNOSIS — Z961 Presence of intraocular lens: Secondary | ICD-10-CM | POA: Diagnosis not present

## 2016-12-04 MED ORDER — FENTANYL 25 MCG/HR TD PT72: 25.0000 ug | MEDICATED_PATCH | TRANSDERMAL | 0 refills | Status: DC

## 2016-12-04 NOTE — Telephone Encounter (Signed)
Rx sent to [facility] phone : [phone] , fax : [phone]  

## 2016-12-08 DIAGNOSIS — F325 Major depressive disorder, single episode, in full remission: Secondary | ICD-10-CM | POA: Diagnosis not present

## 2016-12-08 DIAGNOSIS — Z0000 Encounter for general adult medical examination without abnormal findings: Secondary | ICD-10-CM | POA: Diagnosis not present

## 2016-12-08 DIAGNOSIS — I482 Chronic atrial fibrillation: Secondary | ICD-10-CM | POA: Diagnosis not present

## 2016-12-08 DIAGNOSIS — I10 Essential (primary) hypertension: Secondary | ICD-10-CM | POA: Diagnosis not present

## 2016-12-08 DIAGNOSIS — J441 Chronic obstructive pulmonary disease with (acute) exacerbation: Secondary | ICD-10-CM | POA: Diagnosis not present

## 2016-12-09 DIAGNOSIS — F339 Major depressive disorder, recurrent, unspecified: Secondary | ICD-10-CM | POA: Diagnosis not present

## 2016-12-09 DIAGNOSIS — G4700 Insomnia, unspecified: Secondary | ICD-10-CM | POA: Diagnosis not present

## 2016-12-09 DIAGNOSIS — G3184 Mild cognitive impairment, so stated: Secondary | ICD-10-CM | POA: Diagnosis not present

## 2016-12-09 DIAGNOSIS — F419 Anxiety disorder, unspecified: Secondary | ICD-10-CM | POA: Diagnosis not present

## 2016-12-12 DIAGNOSIS — G510 Bell's palsy: Secondary | ICD-10-CM | POA: Diagnosis not present

## 2016-12-12 DIAGNOSIS — Z87891 Personal history of nicotine dependence: Secondary | ICD-10-CM | POA: Diagnosis not present

## 2016-12-12 DIAGNOSIS — K219 Gastro-esophageal reflux disease without esophagitis: Secondary | ICD-10-CM | POA: Diagnosis not present

## 2016-12-12 DIAGNOSIS — I69354 Hemiplegia and hemiparesis following cerebral infarction affecting left non-dominant side: Secondary | ICD-10-CM | POA: Diagnosis not present

## 2016-12-12 DIAGNOSIS — R1312 Dysphagia, oropharyngeal phase: Secondary | ICD-10-CM | POA: Diagnosis not present

## 2016-12-12 DIAGNOSIS — J449 Chronic obstructive pulmonary disease, unspecified: Secondary | ICD-10-CM | POA: Diagnosis not present

## 2016-12-12 DIAGNOSIS — I69391 Dysphagia following cerebral infarction: Secondary | ICD-10-CM | POA: Diagnosis not present

## 2016-12-15 DIAGNOSIS — I69354 Hemiplegia and hemiparesis following cerebral infarction affecting left non-dominant side: Secondary | ICD-10-CM | POA: Diagnosis not present

## 2016-12-15 DIAGNOSIS — J449 Chronic obstructive pulmonary disease, unspecified: Secondary | ICD-10-CM | POA: Diagnosis not present

## 2016-12-15 DIAGNOSIS — K219 Gastro-esophageal reflux disease without esophagitis: Secondary | ICD-10-CM | POA: Diagnosis not present

## 2016-12-15 DIAGNOSIS — I69391 Dysphagia following cerebral infarction: Secondary | ICD-10-CM | POA: Diagnosis not present

## 2016-12-15 DIAGNOSIS — G510 Bell's palsy: Secondary | ICD-10-CM | POA: Diagnosis not present

## 2016-12-15 DIAGNOSIS — R1312 Dysphagia, oropharyngeal phase: Secondary | ICD-10-CM | POA: Diagnosis not present

## 2016-12-16 DIAGNOSIS — I69354 Hemiplegia and hemiparesis following cerebral infarction affecting left non-dominant side: Secondary | ICD-10-CM | POA: Diagnosis not present

## 2016-12-16 DIAGNOSIS — K219 Gastro-esophageal reflux disease without esophagitis: Secondary | ICD-10-CM | POA: Diagnosis not present

## 2016-12-16 DIAGNOSIS — J449 Chronic obstructive pulmonary disease, unspecified: Secondary | ICD-10-CM | POA: Diagnosis not present

## 2016-12-16 DIAGNOSIS — G510 Bell's palsy: Secondary | ICD-10-CM | POA: Diagnosis not present

## 2016-12-16 DIAGNOSIS — R1312 Dysphagia, oropharyngeal phase: Secondary | ICD-10-CM | POA: Diagnosis not present

## 2016-12-16 DIAGNOSIS — I69391 Dysphagia following cerebral infarction: Secondary | ICD-10-CM | POA: Diagnosis not present

## 2016-12-17 DIAGNOSIS — I69354 Hemiplegia and hemiparesis following cerebral infarction affecting left non-dominant side: Secondary | ICD-10-CM | POA: Diagnosis not present

## 2016-12-17 DIAGNOSIS — R1312 Dysphagia, oropharyngeal phase: Secondary | ICD-10-CM | POA: Diagnosis not present

## 2016-12-17 DIAGNOSIS — J449 Chronic obstructive pulmonary disease, unspecified: Secondary | ICD-10-CM | POA: Diagnosis not present

## 2016-12-17 DIAGNOSIS — I69391 Dysphagia following cerebral infarction: Secondary | ICD-10-CM | POA: Diagnosis not present

## 2016-12-17 DIAGNOSIS — G510 Bell's palsy: Secondary | ICD-10-CM | POA: Diagnosis not present

## 2016-12-17 DIAGNOSIS — K219 Gastro-esophageal reflux disease without esophagitis: Secondary | ICD-10-CM | POA: Diagnosis not present

## 2016-12-18 DIAGNOSIS — I69391 Dysphagia following cerebral infarction: Secondary | ICD-10-CM | POA: Diagnosis not present

## 2016-12-18 DIAGNOSIS — J449 Chronic obstructive pulmonary disease, unspecified: Secondary | ICD-10-CM | POA: Diagnosis not present

## 2016-12-18 DIAGNOSIS — G510 Bell's palsy: Secondary | ICD-10-CM | POA: Diagnosis not present

## 2016-12-18 DIAGNOSIS — I69354 Hemiplegia and hemiparesis following cerebral infarction affecting left non-dominant side: Secondary | ICD-10-CM | POA: Diagnosis not present

## 2016-12-18 DIAGNOSIS — K219 Gastro-esophageal reflux disease without esophagitis: Secondary | ICD-10-CM | POA: Diagnosis not present

## 2016-12-18 DIAGNOSIS — R1312 Dysphagia, oropharyngeal phase: Secondary | ICD-10-CM | POA: Diagnosis not present

## 2016-12-19 DIAGNOSIS — K219 Gastro-esophageal reflux disease without esophagitis: Secondary | ICD-10-CM | POA: Diagnosis not present

## 2016-12-19 DIAGNOSIS — I69391 Dysphagia following cerebral infarction: Secondary | ICD-10-CM | POA: Diagnosis not present

## 2016-12-19 DIAGNOSIS — J449 Chronic obstructive pulmonary disease, unspecified: Secondary | ICD-10-CM | POA: Diagnosis not present

## 2016-12-19 DIAGNOSIS — G510 Bell's palsy: Secondary | ICD-10-CM | POA: Diagnosis not present

## 2016-12-19 DIAGNOSIS — R1312 Dysphagia, oropharyngeal phase: Secondary | ICD-10-CM | POA: Diagnosis not present

## 2016-12-19 DIAGNOSIS — I69354 Hemiplegia and hemiparesis following cerebral infarction affecting left non-dominant side: Secondary | ICD-10-CM | POA: Diagnosis not present

## 2016-12-22 DIAGNOSIS — K219 Gastro-esophageal reflux disease without esophagitis: Secondary | ICD-10-CM | POA: Diagnosis not present

## 2016-12-22 DIAGNOSIS — R1312 Dysphagia, oropharyngeal phase: Secondary | ICD-10-CM | POA: Diagnosis not present

## 2016-12-22 DIAGNOSIS — J449 Chronic obstructive pulmonary disease, unspecified: Secondary | ICD-10-CM | POA: Diagnosis not present

## 2016-12-22 DIAGNOSIS — G510 Bell's palsy: Secondary | ICD-10-CM | POA: Diagnosis not present

## 2016-12-22 DIAGNOSIS — I69391 Dysphagia following cerebral infarction: Secondary | ICD-10-CM | POA: Diagnosis not present

## 2016-12-22 DIAGNOSIS — I69354 Hemiplegia and hemiparesis following cerebral infarction affecting left non-dominant side: Secondary | ICD-10-CM | POA: Diagnosis not present

## 2016-12-24 DIAGNOSIS — I69354 Hemiplegia and hemiparesis following cerebral infarction affecting left non-dominant side: Secondary | ICD-10-CM | POA: Diagnosis not present

## 2016-12-24 DIAGNOSIS — K219 Gastro-esophageal reflux disease without esophagitis: Secondary | ICD-10-CM | POA: Diagnosis not present

## 2016-12-24 DIAGNOSIS — J449 Chronic obstructive pulmonary disease, unspecified: Secondary | ICD-10-CM | POA: Diagnosis not present

## 2016-12-24 DIAGNOSIS — I69391 Dysphagia following cerebral infarction: Secondary | ICD-10-CM | POA: Diagnosis not present

## 2016-12-24 DIAGNOSIS — R1312 Dysphagia, oropharyngeal phase: Secondary | ICD-10-CM | POA: Diagnosis not present

## 2016-12-24 DIAGNOSIS — G510 Bell's palsy: Secondary | ICD-10-CM | POA: Diagnosis not present

## 2016-12-25 DIAGNOSIS — R1312 Dysphagia, oropharyngeal phase: Secondary | ICD-10-CM | POA: Diagnosis not present

## 2016-12-25 DIAGNOSIS — K219 Gastro-esophageal reflux disease without esophagitis: Secondary | ICD-10-CM | POA: Diagnosis not present

## 2016-12-25 DIAGNOSIS — I69354 Hemiplegia and hemiparesis following cerebral infarction affecting left non-dominant side: Secondary | ICD-10-CM | POA: Diagnosis not present

## 2016-12-25 DIAGNOSIS — I69391 Dysphagia following cerebral infarction: Secondary | ICD-10-CM | POA: Diagnosis not present

## 2016-12-25 DIAGNOSIS — J449 Chronic obstructive pulmonary disease, unspecified: Secondary | ICD-10-CM | POA: Diagnosis not present

## 2016-12-25 DIAGNOSIS — G510 Bell's palsy: Secondary | ICD-10-CM | POA: Diagnosis not present

## 2016-12-26 DIAGNOSIS — R1312 Dysphagia, oropharyngeal phase: Secondary | ICD-10-CM | POA: Diagnosis not present

## 2016-12-26 DIAGNOSIS — K219 Gastro-esophageal reflux disease without esophagitis: Secondary | ICD-10-CM | POA: Diagnosis not present

## 2016-12-26 DIAGNOSIS — I69354 Hemiplegia and hemiparesis following cerebral infarction affecting left non-dominant side: Secondary | ICD-10-CM | POA: Diagnosis not present

## 2016-12-26 DIAGNOSIS — J449 Chronic obstructive pulmonary disease, unspecified: Secondary | ICD-10-CM | POA: Diagnosis not present

## 2016-12-26 DIAGNOSIS — G510 Bell's palsy: Secondary | ICD-10-CM | POA: Diagnosis not present

## 2016-12-26 DIAGNOSIS — I69391 Dysphagia following cerebral infarction: Secondary | ICD-10-CM | POA: Diagnosis not present

## 2016-12-29 DIAGNOSIS — G510 Bell's palsy: Secondary | ICD-10-CM | POA: Diagnosis not present

## 2016-12-29 DIAGNOSIS — R1312 Dysphagia, oropharyngeal phase: Secondary | ICD-10-CM | POA: Diagnosis not present

## 2016-12-29 DIAGNOSIS — J449 Chronic obstructive pulmonary disease, unspecified: Secondary | ICD-10-CM | POA: Diagnosis not present

## 2016-12-29 DIAGNOSIS — I69391 Dysphagia following cerebral infarction: Secondary | ICD-10-CM | POA: Diagnosis not present

## 2016-12-29 DIAGNOSIS — K219 Gastro-esophageal reflux disease without esophagitis: Secondary | ICD-10-CM | POA: Diagnosis not present

## 2016-12-29 DIAGNOSIS — I69354 Hemiplegia and hemiparesis following cerebral infarction affecting left non-dominant side: Secondary | ICD-10-CM | POA: Diagnosis not present

## 2016-12-30 DIAGNOSIS — J449 Chronic obstructive pulmonary disease, unspecified: Secondary | ICD-10-CM | POA: Diagnosis not present

## 2016-12-30 DIAGNOSIS — R1312 Dysphagia, oropharyngeal phase: Secondary | ICD-10-CM | POA: Diagnosis not present

## 2016-12-30 DIAGNOSIS — G510 Bell's palsy: Secondary | ICD-10-CM | POA: Diagnosis not present

## 2016-12-30 DIAGNOSIS — K219 Gastro-esophageal reflux disease without esophagitis: Secondary | ICD-10-CM | POA: Diagnosis not present

## 2016-12-30 DIAGNOSIS — I69391 Dysphagia following cerebral infarction: Secondary | ICD-10-CM | POA: Diagnosis not present

## 2016-12-30 DIAGNOSIS — I69354 Hemiplegia and hemiparesis following cerebral infarction affecting left non-dominant side: Secondary | ICD-10-CM | POA: Diagnosis not present

## 2016-12-31 ENCOUNTER — Other Ambulatory Visit: Payer: Self-pay

## 2016-12-31 DIAGNOSIS — K219 Gastro-esophageal reflux disease without esophagitis: Secondary | ICD-10-CM | POA: Diagnosis not present

## 2016-12-31 DIAGNOSIS — R1312 Dysphagia, oropharyngeal phase: Secondary | ICD-10-CM | POA: Diagnosis not present

## 2016-12-31 DIAGNOSIS — I69354 Hemiplegia and hemiparesis following cerebral infarction affecting left non-dominant side: Secondary | ICD-10-CM | POA: Diagnosis not present

## 2016-12-31 DIAGNOSIS — G510 Bell's palsy: Secondary | ICD-10-CM | POA: Diagnosis not present

## 2016-12-31 DIAGNOSIS — J449 Chronic obstructive pulmonary disease, unspecified: Secondary | ICD-10-CM | POA: Diagnosis not present

## 2016-12-31 DIAGNOSIS — I69391 Dysphagia following cerebral infarction: Secondary | ICD-10-CM | POA: Diagnosis not present

## 2016-12-31 MED ORDER — FENTANYL 25 MCG/HR TD PT72: 25.0000 ug | MEDICATED_PATCH | TRANSDERMAL | 0 refills | Status: DC

## 2016-12-31 NOTE — Telephone Encounter (Signed)
Rx sent to [facility] phone : [phone] , fax : [phone]  

## 2017-01-03 ENCOUNTER — Encounter
Admission: RE | Admit: 2017-01-03 | Discharge: 2017-01-03 | Disposition: A | Discharge status: Home / Self Care | Payer: Medicare Other | Source: Ambulatory Visit | Attending: Internal Medicine | Admitting: Internal Medicine

## 2017-01-06 ENCOUNTER — Non-Acute Institutional Stay (SKILLED_NURSING_FACILITY): Payer: Medicare Other | Admitting: Gerontology

## 2017-01-06 ENCOUNTER — Encounter: Payer: Self-pay | Admitting: Gerontology

## 2017-01-06 DIAGNOSIS — D510 Vitamin B12 deficiency anemia due to intrinsic factor deficiency: Secondary | ICD-10-CM | POA: Diagnosis not present

## 2017-01-06 DIAGNOSIS — Z87891 Personal history of nicotine dependence: Secondary | ICD-10-CM

## 2017-01-06 DIAGNOSIS — I129 Hypertensive chronic kidney disease with stage 1 through stage 4 chronic kidney disease, or unspecified chronic kidney disease: Secondary | ICD-10-CM | POA: Diagnosis not present

## 2017-01-06 DIAGNOSIS — I480 Paroxysmal atrial fibrillation: Secondary | ICD-10-CM

## 2017-01-06 DIAGNOSIS — R1312 Dysphagia, oropharyngeal phase: Secondary | ICD-10-CM

## 2017-01-06 DIAGNOSIS — R0789 Other chest pain: Secondary | ICD-10-CM

## 2017-01-06 DIAGNOSIS — I7121 Aneurysm of the ascending aorta, without rupture: Secondary | ICD-10-CM

## 2017-01-06 DIAGNOSIS — H04123 Dry eye syndrome of bilateral lacrimal glands: Secondary | ICD-10-CM | POA: Diagnosis not present

## 2017-01-06 DIAGNOSIS — I69391 Dysphagia following cerebral infarction: Secondary | ICD-10-CM | POA: Diagnosis not present

## 2017-01-06 DIAGNOSIS — I351 Nonrheumatic aortic (valve) insufficiency: Secondary | ICD-10-CM

## 2017-01-06 DIAGNOSIS — I69354 Hemiplegia and hemiparesis following cerebral infarction affecting left non-dominant side: Secondary | ICD-10-CM | POA: Diagnosis not present

## 2017-01-06 DIAGNOSIS — J309 Allergic rhinitis, unspecified: Secondary | ICD-10-CM

## 2017-01-06 DIAGNOSIS — I712 Thoracic aortic aneurysm, without rupture: Secondary | ICD-10-CM

## 2017-01-06 DIAGNOSIS — F325 Major depressive disorder, single episode, in full remission: Secondary | ICD-10-CM | POA: Diagnosis not present

## 2017-01-06 DIAGNOSIS — E785 Hyperlipidemia, unspecified: Secondary | ICD-10-CM

## 2017-01-06 DIAGNOSIS — Z9181 History of falling: Secondary | ICD-10-CM

## 2017-01-06 DIAGNOSIS — K30 Functional dyspepsia: Secondary | ICD-10-CM

## 2017-01-06 DIAGNOSIS — R41841 Cognitive communication deficit: Secondary | ICD-10-CM

## 2017-01-06 DIAGNOSIS — N183 Chronic kidney disease, stage 3 unspecified: Secondary | ICD-10-CM

## 2017-01-06 DIAGNOSIS — G8929 Other chronic pain: Secondary | ICD-10-CM

## 2017-01-06 DIAGNOSIS — J449 Chronic obstructive pulmonary disease, unspecified: Secondary | ICD-10-CM

## 2017-01-06 DIAGNOSIS — G510 Bell's palsy: Secondary | ICD-10-CM

## 2017-01-06 DIAGNOSIS — M810 Age-related osteoporosis without current pathological fracture: Secondary | ICD-10-CM

## 2017-01-06 DIAGNOSIS — G4700 Insomnia, unspecified: Secondary | ICD-10-CM | POA: Diagnosis not present

## 2017-01-06 DIAGNOSIS — F419 Anxiety disorder, unspecified: Secondary | ICD-10-CM | POA: Diagnosis not present

## 2017-01-06 DIAGNOSIS — K3184 Gastroparesis: Secondary | ICD-10-CM

## 2017-01-06 DIAGNOSIS — I10 Essential (primary) hypertension: Secondary | ICD-10-CM

## 2017-01-06 DIAGNOSIS — K219 Gastro-esophageal reflux disease without esophagitis: Secondary | ICD-10-CM

## 2017-01-06 NOTE — Progress Notes (Signed)
Location:   [facility] Room Number: [place] of Service:  SNF [phone]) Provider:  Toni Arthurs, NP-C  Juluis Pitch, MD  Patient Care Team: Juluis Pitch, MD as PCP - General [facility] Medicine)  Extended Emergency Contact Information Primary Emergency Contact: [name] Address: [address]          [place], [place] [address] [name] of [place] Phone: [phone] Relation: Daughter Secondary Emergency Contact: Lingerfelt,Brad Address: [place], [place] [address] [name] of [place] Phone: [phone] Relation: [name]  Code Status:  Full Goals of care: Advanced Directive information Advanced Directives 01/06/2017  Does Patient Have a Medical Advance Directive? Yes  Type of Advance Directive Out of facility DNR (pink MOST or yellow form)  Does patient want to make changes to medical advance directive? No - Patient declined  Would patient like information on creating a medical advance directive? -     Chief Complaint  Patient presents with  . Medical Management of Chronic Issues    Routine Visit    HPI:  Pt is a 83 y.o. female seen today for medical management of chronic diseases. Pt has multiple medical issues, many related to pain. Pt is on chronic PO steroids and inhalers for COPD and Bells Palsy. As a result, pt has had multiple wedge compression fractures of the lumbar vertebral spine.  Pt reports her appetite is good, having regular BMs, voiding without difficulty. She is mobile in the wheelchair. She does socialize with other residents consistently .She seems to enjoy working the puzzles. Pt has had one fall this past month. She fell forward off the toilet. She was laughing at herself when nurse assisted her off the floor. No c/o pain at that time. She has intermittent flares of COPD that tend to not resolve quickly.  Aside from the chronic pain- which has improved, pt has no complaints at  this time. Pt denies n/v/d/f/c/cp/sob/ha/abd pain/dizziness/cough. VSS. No other complaints.     Past Medical History:  Diagnosis Date  . Adenoma of rectum   . Adrenal adenoma   . Anemia   . Anemia, unspecified   . Arthritis   . Ascending aortic aneurysm ([place])    a. s/p repair 07/2014  . Atrophic vaginitis   . Benign neoplasm of colon, unspecified   . COPD (chronic obstructive pulmonary disease) ([place])   . Coronary artery disease, non-occlusive    a. LHC 06/2014 without evidence of obstructive disease  . Depression   . Diverticulosis   . Dysphagia   . Dysrhythmia   . Essential hypertension    benign  . Frequent UTI   . GERD (gastroesophageal reflux disease)   . Hemiplegia and hemiparesis ([place])    following cerebral infarction affecting left non-dominant side  . Hypertension   . Microscopic hematuria   . Nonrheumatic aortic valve insufficiency   . Osteoporosis    unspecified  . Panic attacks   . Paroxysmal atrial fibrillation (HCC)   . Pernicious anemia   . Renal cyst    CKD STAGE 3  . Stroke ([place]) 07/23/2014   a. post-operative setting in 07/2014 leading to left UE hemiapresis and left LE weakness  . Urge incontinence    Past Surgical History:  Procedure Laterality Date  . ABDOMINAL HYSTERECTOMY  1972  . AORTOILIAC BYPASS    . ASCENDING AORTIC ANEURYSM REPAIR  07/23/2014   07/2014  . BLEPHAROPLASTY    .  CATARACT EXTRACTION  2005  . GASTROSTOMY W/ FEEDING TUBE    . INTRAMEDULLARY (IM) NAIL INTERTROCHANTERIC Left 02/26/2016   Procedure: INTRAMEDULLARY (IM) NAIL INTERTROCHANTRIC;  Surgeon: Earnestine Leys, MD;  Location: [facility] ORS;  Service: Orthopedics;  Laterality: Left;  . KYPHOPLASTY N/A 08/26/2016   Procedure: KYPHOPLASTY T12;  Surgeon: Hessie Knows, MD;  Location: [facility] ORS;  Service: Orthopedics;  Laterality: N/A;  . LEFT HEART CATH  06/2014  . PR ASCEND AORTIC GRAFT INCL VALVE SUSPENSION  07/07/2014   Procedure: ASCENDING AORTA GRAFT, WITH CARDIOPULMONARY BYPASS,  WITH OR WITHOUT VALVE SUSPENSION; Surgeon: Dwaine Deter, MD; Location: MAIN OR [facility]; Service: Cardiothoracic  . PR LAP, GASTROTOMY W/O TUBE CONSTR  08/08/2014   Procedure: LAPAROSCOPY, SURGICAL; GASTOSTOMY W/O CONSTRUCTION OF GASTRIC TUBE (EG, STAMM PROCEDURE)(SEPARATE PROCED); Surgeon: Fredrik Rigger, MD; Location: MAIN OR [facility]; Service: Gastrointestinal  . PR PLACE PERCUT GASTROSTOMY TUBE  08/08/2014   Procedure: UGI ENDO; W/DIRECTED PLCMT PERQ GASTROSTOMY TUBE; Surgeon: Fredrik Rigger, MD; Location: MAIN OR [facility]; Service: Gastrointestinal    Allergies  Allergen Reactions  . Iodinated Diagnostic Agents Itching and Swelling  . Nitrofurantoin Diarrhea    Other reaction(s): Unknown  . Omnipaque [Iohexol]   . Solifenacin Other (See Comments)    indigestion  . Ciprofloxacin Other (See Comments) and Rash    Colitis  . Metronidazole Itching and Rash  . Sulfa Antibiotics Itching and Rash    Allergies as of 01/06/2017      Reactions   Iodinated Diagnostic Agents Itching, Swelling   Nitrofurantoin Diarrhea   Other reaction(s): Unknown   Omnipaque [iohexol]    Solifenacin Other (See Comments)   indigestion   Ciprofloxacin Other (See Comments), Rash   Colitis   Metronidazole Itching, Rash   Sulfa Antibiotics Itching, Rash      Medication List       Accurate as of 01/06/17 10:43 AM. Always use your most recent med list.          acetaminophen 325 MG tablet Commonly known as:  TYLENOL Take 650 mg by mouth 4 (four) times daily.   albuterol 108 (90 Base) MCG/ACT inhaler Commonly known as:  PROVENTIL HFA;VENTOLIN HFA Inhale 2 puffs into the lungs every 4 (four) hours as needed for wheezing or shortness of breath.   alum & mag hydroxide-simeth 277-824-23 MG/5ML suspension Commonly known as:  MAALOX PLUS Take 30 mLs by mouth every 4 (four) hours as needed.   ASPERCREME LIDOCAINE 4 % Ptch Generic drug:  Lidocaine Apply 1 patch topically daily. Apply to right  flank/sore ribs daily. Remove after 12 hours.   atorvastatin 20 MG tablet Commonly known as:  LIPITOR Take 20 mg by mouth at bedtime.   benzonatate 100 MG capsule Commonly known as:  TESSALON Take 100 mg by mouth every 8 (eight) hours as needed for cough.   BREO ELLIPTA 100-25 MCG/INH Aepb Generic drug:  fluticasone furoate-vilanterol Inhale 1 puff into the lungs daily.   Cholecalciferol 4000 units Caps Take 1 capsule by mouth daily.   clonazePAM 0.25 MG disintegrating tablet Commonly known as:  KLONOPIN Take 0.25 mg by mouth daily.   cyanocobalamin 500 MCG tablet Take 500 mcg by mouth daily.   dextromethorphan-guaiFENesin 30-600 MG 12hr tablet Commonly known as:  MUCINEX DM Take 1 tablet by mouth 2 (two) times daily.   diclofenac sodium 1 % Gel Commonly known as:  VOLTAREN Apply 4 g topically 4 (four) times daily as needed (pain). To lumbar spine   diltiazem 60 MG  tablet Commonly known as:  CARDIZEM Take 60 mg by mouth daily.   docusate sodium 100 MG capsule Commonly known as:  COLACE Take 1 capsule (100 mg total) by mouth 2 (two) times daily.   ELIQUIS 2.5 MG Tabs tablet Generic drug:  apixaban Take 2.5 mg by mouth 2 (two) times daily.   fentaNYL 25 MCG/HR patch Commonly known as:  DURAGESIC - dosed mcg/hr Place 1 patch (25 mcg total) onto the skin every 3 (three) days.   ferrous sulfate 325 (65 FE) MG tablet Take 1 tablet (325 mg total) by mouth 2 (two) times daily with a meal.   fluticasone 50 MCG/ACT nasal spray Commonly known as:  FLONASE Place 1 spray into both nostrils daily.   furosemide 40 MG tablet Commonly known as:  LASIX Take 40 mg by mouth daily.   gabapentin 100 MG capsule Commonly known as:  NEURONTIN Take 200 mg by mouth 3 (three) times daily. 2 caps   ipratropium-albuterol 0.5-2.5 (3) MG/3ML Soln Commonly known as:  DUONEB Take 3 mLs by nebulization 3 (three) times daily.   magnesium hydroxide 400 MG/5ML suspension Commonly known  as:  MILK OF MAGNESIA Take 30 mLs by mouth daily as needed for mild constipation.   meloxicam 7.5 MG tablet Commonly known as:  MOBIC Take 7.5 mg by mouth 2 (two) times daily.   methocarbamol 500 MG tablet Commonly known as:  ROBAXIN Take 250 mg by mouth 3 (three) times daily. 1/2 tablet   metoCLOPramide 5 MG tablet Commonly known as:  REGLAN Take 5 mg by mouth 2 (two) times daily.   omeprazole 40 MG capsule Commonly known as:  PRILOSEC Take 40 mg by mouth daily.   oxycodone 5 MG capsule Commonly known as:  OXY-IR Take 5 mg by mouth every 4 (four) hours as needed. For moderate or severe pain   OXYGEN Inhale 2 L into the lungs continuous as needed.   polyethylene glycol packet Commonly known as:  MIRALAX Take 17 g by mouth 2 (two) times daily.   polyvinyl alcohol-povidone 1.4-0.6 % ophthalmic solution Commonly known as:  HYPOTEARS Place 1 drop into both eyes 4 (four) times daily as needed (dry eyes).   potassium chloride SA 20 MEQ tablet Commonly known as:  K-DUR,KLOR-CON Take 20 mEq by mouth daily.   predniSONE 10 MG tablet Commonly known as:  DELTASONE Take 10 mg by mouth daily with breakfast.   REFRESH LACRI-LUBE Oint Place 1/4 ribbon in both eyes at bedtime for dry eyes   REFRESH TEARS 0.5 % Soln Generic drug:  carboxymethylcellulose Place 2 drops into both eyes See admin instructions. 1 to 4 times daily prn   SENNA-PLUS 8.6-50 MG tablet Generic drug:  senna-docusate Take 2 tablets by mouth 2 (two) times daily as needed.   sertraline 100 MG tablet Commonly known as:  ZOLOFT Take 100 mg by mouth daily. 8 pm   sertraline 50 MG tablet Commonly known as:  ZOLOFT Take 50 mg by mouth at bedtime. 8 pm   sodium phosphate Pediatric 3.5-9.5 GM/59ML enema Place 1 enema rectally once as needed for severe constipation. Take once every 3 days if constipation is not relieved by Milk of magnesia or Bisacodyl Suppository   spironolactone 50 MG tablet Commonly known  as:  ALDACTONE Take 50 mg by mouth daily.       Review of Systems  Constitutional: Negative for activity change, appetite change, chills, diaphoresis, fatigue, fever and unexpected weight change (weight gain).  HENT: Negative for  congestion, ear pain, mouth sores, nosebleeds, postnasal drip, rhinorrhea, sinus pain, sinus pressure, sneezing, sore throat, trouble swallowing and voice change.   Eyes: Negative.   Respiratory: Negative for apnea, cough, choking, chest tightness, shortness of breath and wheezing.   Cardiovascular: Negative for chest pain, palpitations and leg swelling.  Gastrointestinal: Negative for abdominal distention, abdominal pain, constipation, diarrhea and nausea.  Genitourinary: Negative for difficulty urinating, dysuria, frequency (chronic nocturia) and urgency.  Musculoskeletal: Positive for arthralgias (typical arthritis) and back pain (chronic). Negative for gait problem and myalgias.  Skin: Negative for color change, pallor, rash and wound.  Neurological: Negative for dizziness, tremors, syncope, speech difficulty, weakness, numbness and headaches.  Psychiatric/Behavioral: Negative for agitation and behavioral problems.  All other systems reviewed and are negative.   Immunization History  Administered Date(s) Administered  . Influenza Inj Mdck Quad Pf 02/14/2016  . Influenza-Unspecified 02/02/2014  . PPD Test 08/10/2014, 08/24/2014, 02/20/2016  . Pneumococcal-Unspecified 02/20/2016   Pertinent  Health Maintenance Due  Topic Date Due  . DEXA SCAN  07/01/1992  . INFLUENZA VACCINE  12/03/2016  . PNA vac Low Risk Adult (2 of 2 - PCV13) 02/19/2017   No flowsheet data found. Functional Status Survey:    Vitals:   01/06/17 0943  BP: (!) 119/52  Pulse: 72  Resp: 20  Temp: 98.4 F (36.9 C)  SpO2: 95%  Weight: 126 lb 6.4 oz (57.3 kg)  Height: 5\' 2"  (1.575 m)   Body mass index is 23.12 kg/m. Physical Exam  Constitutional: She is oriented to person,  place, and time. Vital signs are normal. She appears well-developed and well-nourished. She is active and cooperative. She does not appear ill. No distress.  HENT:  Head: Normocephalic and atraumatic.  Mouth/Throat: Uvula is midline, oropharynx is clear and moist and mucous membranes are normal. Mucous membranes are not pale, not dry and not cyanotic.  Eyes: Pupils are equal, round, and reactive to light. Conjunctivae, EOM and lids are normal.  Neck: Trachea normal, normal range of motion and full passive range of motion without pain. Neck supple. No JVD present. No tracheal deviation, no edema and no erythema present. No thyromegaly present.  Cardiovascular: Normal rate, regular rhythm, normal heart sounds and intact distal pulses.  Exam reveals no gallop, no distant heart sounds and no friction rub.   No murmur heard. Pulses:      Dorsalis pedis pulses are 1+ on the right side, and 1+ on the left side.  No edema. TED Hose in place  Pulmonary/Chest: Effort normal. No accessory muscle usage. No respiratory distress. She has no decreased breath sounds. She has no wheezes. She has no rhonchi. She has no rales. She exhibits no tenderness.  Abdominal: Soft. Normal appearance and bowel sounds are normal. She exhibits no distension and no ascites. There is no tenderness.  Musculoskeletal: Normal range of motion. She exhibits no edema or tenderness.  Expected osteoarthritis, stiffness  Neurological: She is alert and oriented to person, place, and time. She has normal strength.  Skin: Skin is warm, dry and intact. No rash noted. She is not diaphoretic. No cyanosis or erythema. No pallor. Nails show no clubbing.  Psychiatric: She has a normal mood and affect. Her speech is normal and behavior is normal. Judgment and thought content normal. Cognition and memory are normal.  Nursing note and vitals reviewed.   Labs reviewed:  Recent Labs  07/21/16 1500 08/22/16 1036 09/04/16 1700 10/09/16 1500  NA  138  --  140 138  K 4.5 4.9 4.7 5.3*  CL 102  --  105 104  CO2 27  --  29 28  GLUCOSE 202*  --  106* 121*  BUN 18  --  25* 26*  CREATININE 1.28*  --  1.18* 1.43*  CALCIUM 8.4*  --  8.7* 9.0  MG  --   --  2.1  --     Recent Labs  07/21/16 1500 09/04/16 1700 10/09/16 1500  AST 19 18 19   ALT 14 15 12*  ALKPHOS 113 109 88  BILITOT 0.7 0.8 0.8  PROT 5.7* 5.7* 5.6*  ALBUMIN 3.7 3.8 3.7    Recent Labs  07/21/16 1500 09/04/16 1700 10/09/16 1500  WBC 11.1* 7.6 8.6  NEUTROABS 9.9* 6.3 7.7*  HGB 11.2* 9.3* 10.8*  HCT 34.1* 28.1* 32.4*  MCV 90.8 90.7 91.5  PLT 207 204 181   Lab Results  Component Value Date   TSH 2.985 09/04/2016   Lab Results  Component Value Date   HGBA1C 5.9 (H) 02/25/2016   Lab Results  Component Value Date   CHOL 127 02/09/2015   HDL 72 02/09/2015   LDLCALC 49 02/09/2015   TRIG 28 02/09/2015   CHOLHDL 1.8 02/09/2015    Significant Diagnostic Results in last 30 days:  No results found.  Assessment/Plan 1. Hemiplegia and hemiparesis following cerebral infarction affecting left non-dominant side (HCC)  Stable  Hand splints   Mobile with wheelchair  2. Hx of falling  Stable   3. Dysphagia following cerebral infarction  Stable   4. Dysphagia, oropharyngeal  Stable   5. Cognitive communication deficit  Stable   6. Hypertensive chronic kidney disease w stg 1-4/unsp chr kdny  Stable  Renally adjust meds as appropriate  7. Chronic kidney disease, stage III (moderate)  Stable  Renally adjust meds as appropriate  8. Vitamin B12 deficiency anemia due to intrinsic factor deficiency  Stable  Continue Cyanocobalamin 500 mcg po Q Day  9. Personal history of nicotine dependence  Resolved   10. Anxiety disorder, unspecified type  Stable  Continue Clonazepam 0.25 mg po Q Day  11. Insomnia, unspecified type  Stable  Monitoring for needs   12. 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 Dry eye syndrome of both lacrimal glands  Stable  Artificial tears- ointment in the Left eye Q HS  Continue Hypotears- 1 drop in each eye QID  14. Allergic rhinitis, unspecified seasonality, unspecified trigger  Stable  Continue Fluticasone 50 mcg - 1 spray per nostril Q Day  15. Bell's palsy  Stable  Artificial tears- ointment in the Left eye Q HS  16. Nonrheumatic aortic valve insufficiency  Stable   17. Aneurysm, ascending aorta (HCC)  Stable   18. Benign essential HTN  Stable  Continue Furosemide 40 mg po Q Day  Continue Klor-Con 20 meq po BID  Spirolalactone 50 mg po Q Day  19. Paroxysmal atrial fibrillation (HCC)  Stable  Continue Cardizem 60 mg po Q Day  Continue Eliquis 2.5 mg po BID  20. Chronic obstructive pulmonary disease, unspecified COPD type (HCC)  Stable  Continue Tessalon Perles 100 mg po Q 8 hrs PRN  Continue Breo-Ellipta 100-25 mcg- 1 inhalation Q Day  Continue Combivent 20-200 QID  Proventil 90 mcg- 2 puffs Q 4 hours prn  Proventil 90 mcg- 2 puffs TID x 7 days  21. Age related osteoporosis, unspecified pathological fracture presence  Not- stable  Recent multi-level Kyphoplasty for compression fractures  Cholecalciferol 4,000 units po Q Day  22. Other chronic pain  Stable  Continue Tylenol 650 mg po QID  Continue Oxycodone 5 mg po Q 4 hours prn  Continue Methocarbamol 500 mg- 1/2 tablet po TID for spasms  Continue meloxicam 7.5 mg po Q 12 hours- severe pain  Voltaren gel 1%- apply 4 grams to the lumbar spine QID prn  Fentanyl 25 mcg patch- Q 3 days for pain # 5  23. Hyperlipidemia, unspecified hyperlipidemia type  Stable  Continue Atorvastatin 20 mg po Q HS  24. Addison anemia  Stable  Continue Ferrous Sulfate 325 mg po BID  25. Gastroesophageal Reflux disease without esophagitis  Stable  Continue Omeprazole 40 mg po Q Day  26. Chronic  chest wall pain  Continue Aspercreme (Lidocaine) patch 4%- Apply to right ribs daily, remove patch after 12 hours  Increase Gabapentin 200 mg po TID  27. Decreased motility of the stomach  Metoclopramide 5 mg po BID for recurrent ileus  Family/ staff Communication:   Total Time:  Documentation:  Face to Face:  Family/Phone:   Labs/tests ordered:    Medication list reviewed and assessed for continued appropriateness. Monthly medication orders reviewed and signed.  Vikki Ports, NP-C Geriatrics [facility] [phone], [place] [address] Cell Phone (Mon-Fri 8am-5pm):  [phone] On Call:  [phone] & follow prompts after 5pm & weekends Office Phone:  [phone] Office Fax:  [phone]

## 2017-01-15 ENCOUNTER — Ambulatory Visit (INDEPENDENT_AMBULATORY_CARE_PROVIDER_SITE_OTHER): Payer: Medicare Other | Admitting: Internal Medicine

## 2017-01-15 ENCOUNTER — Encounter: Payer: Self-pay | Admitting: Internal Medicine

## 2017-01-15 VITALS — BP 120/60 | HR 67 | Resp 16 | Ht 62.0 in | Wt 126.0 lb

## 2017-01-15 DIAGNOSIS — J441 Chronic obstructive pulmonary disease with (acute) exacerbation: Secondary | ICD-10-CM

## 2017-01-15 MED ORDER — AZITHROMYCIN 250 MG PO TABS: ORAL_TABLET | ORAL | 0 refills | Status: DC

## 2017-01-15 MED ORDER — GUAIFENESIN-CODEINE 100-10 MG/5ML PO SYRP: 5.0000 mL | ORAL_SOLUTION | Freq: Four times a day (QID) | ORAL | 0 refills | Status: DC | PRN

## 2017-01-15 MED ORDER — PREDNISONE 20 MG PO TABS: 20.0000 mg | ORAL_TABLET | Freq: Every day | ORAL | 0 refills | Status: DC

## 2017-01-15 NOTE — Patient Instructions (Signed)
Prednisone 20 mg for 10 days z pak Robitussin with codeine

## 2017-01-15 NOTE — Progress Notes (Signed)
Ana [facility]      Date: 01/15/2017,   MRN# [id number] Ana [facility] 08/25/1925 Code Status:  Code Status History    This patient does not have a recorded code status. Please follow your organizational policy for patients in this situation.        Admission                  Current  Ana [facility] is a 83 y.o. old female seen in [facility] for cough at the request of Dr. Pryor Ochoa.     CHIEF COMPLAINT:   Follow up cough   HISTORY OF PRESENT ILLNESS   Patient prescribed prednisone from last visit and she feels much better, her symptoms returned after completing the prednisone Coughing and intermittent wheezing, increased SOB, DOE  Patient has some hoarseness  Patient had increased sputum production, green and yellow Patient's biggest complaint right now is her cough, which seems to be bothering her more than anything   Patient seen approximately one year ago and was given prednisone therapy and supposedly she responds to prednisone therapy for her recurrent wheezing and coarse breath sounds and shortness of breath No fevers at this time Patient uses albuterol inhaler as needed and it does seem to help Patient's main complaint is actually her cough, which is bothering her significantly   Current Medication:  Current Outpatient Prescriptions:  .  acetaminophen (TYLENOL) 325 MG tablet, Take 650 mg by mouth 4 (four) times daily. , Disp: , Rfl:  .  albuterol (PROVENTIL HFA;VENTOLIN HFA) 108 (90 Base) MCG/ACT inhaler, Inhale 2 puffs into the lungs every 4 (four) hours as needed for wheezing or shortness of breath., Disp: 1 Inhaler, Rfl: 2 .  alum & mag hydroxide-simeth (MAALOX PLUS) 400-400-40 MG/5ML suspension, Take 30 mLs by mouth every 4 (four) hours as needed., Disp: , Rfl:  .  apixaban (ELIQUIS) 2.5 MG TABS tablet, Take 2.5 mg by mouth 2 (two) times daily., Disp: , Rfl:  .  Artificial Tear Ointment (REFRESH LACRI-LUBE) OINT, Place 1/4  ribbon in both eyes at bedtime for dry eyes, Disp: , Rfl:  .  atorvastatin (LIPITOR) 20 MG tablet, Take 20 mg by mouth at bedtime. , Disp: , Rfl:  .  benzonatate (TESSALON) 100 MG capsule, Take 100 mg by mouth every 8 (eight) hours as needed for cough., Disp: , Rfl:  .  BREO ELLIPTA 100-25 MCG/INH AEPB, Inhale 1 puff into the lungs daily. , Disp: , Rfl:  .  carboxymethylcellulose (REFRESH TEARS) 0.5 % SOLN, Place 2 drops into both eyes See admin instructions. 1 to 4 times daily prn, Disp: , Rfl:  .  Cholecalciferol 4000 units CAPS, Take 1 capsule by mouth daily., Disp: , Rfl:  .  clonazePAM (KLONOPIN) 0.25 MG disintegrating tablet, Take 0.25 mg by mouth daily. , Disp: , Rfl:  .  cyanocobalamin 500 MCG tablet, Take 500 mcg by mouth daily., Disp: , Rfl:  .  dextromethorphan-guaiFENesin (MUCINEX DM) 30-600 MG 12hr tablet, Take 1 tablet by mouth 2 (two) times daily., Disp: , Rfl:  .  diclofenac sodium (VOLTAREN) 1 % GEL, Apply 4 g topically 4 (four) times daily as needed (pain). To lumbar spine, Disp: , Rfl:  .  diltiazem (CARDIZEM) 60 MG tablet, Take 60 mg by mouth daily., Disp: , Rfl:  .  docusate sodium (COLACE) 100 MG capsule, Take 1 capsule (100 mg total) by mouth 2 (two) times daily., Disp: 10 capsule, Rfl: 0 .  fentaNYL (DURAGESIC -  DOSED MCG/HR) 25 MCG/HR patch, Place 1 patch (25 mcg total) onto the skin every 3 (three) days., Disp: 10 patch, Rfl: 0 .  ferrous sulfate 325 (65 FE) MG tablet, Take 1 tablet (325 mg total) by mouth 2 (two) times daily with a meal., Disp: 60 tablet, Rfl: 3 .  fluticasone (FLONASE) 50 MCG/ACT nasal spray, Place 1 spray into both nostrils daily., Disp: , Rfl:  .  furosemide (LASIX) 40 MG tablet, Take 40 mg by mouth daily. , Disp: , Rfl:  .  gabapentin (NEURONTIN) 100 MG capsule, Take 200 mg by mouth 3 (three) times daily. 2 caps, Disp: , Rfl:  .  ipratropium-albuterol (DUONEB) 0.5-2.5 (3) MG/3ML SOLN, Take 3 mLs by nebulization 3 (three) times daily., Disp: , Rfl:  .   Lidocaine (ASPERCREME LIDOCAINE) 4 % PTCH, Apply 1 patch topically daily. Apply to right flank/sore ribs daily. Remove after 12 hours., Disp: , Rfl:  .  magnesium hydroxide (MILK OF MAGNESIA) 400 MG/5ML suspension, Take 30 mLs by mouth daily as needed for mild constipation., Disp: , Rfl:  .  meloxicam (MOBIC) 7.5 MG tablet, Take 7.5 mg by mouth 2 (two) times daily. , Disp: , Rfl:  .  methocarbamol (ROBAXIN) 500 MG tablet, Take 250 mg by mouth 3 (three) times daily. 1/2 tablet, Disp: , Rfl:  .  metoCLOPramide (REGLAN) 5 MG tablet, Take 5 mg by mouth 2 (two) times daily., Disp: , Rfl:  .  omeprazole (PRILOSEC) 40 MG capsule, Take 40 mg by mouth daily. , Disp: , Rfl:  .  oxycodone (OXY-IR) 5 MG capsule, Take 5 mg by mouth every 4 (four) hours as needed. For moderate or severe pain, Disp: , Rfl:  .  OXYGEN, Inhale 2 L into the lungs continuous as needed., Disp: , Rfl:  .  polyethylene glycol (MIRALAX) packet, Take 17 g by mouth 2 (two) times daily., Disp: 14 each, Rfl: 0 .  polyvinyl alcohol-povidone (HYPOTEARS) 1.4-0.6 % ophthalmic solution, Place 1 drop into both eyes 4 (four) times daily as needed (dry eyes). , Disp: , Rfl:  .  potassium chloride SA (K-DUR,KLOR-CON) 20 MEQ tablet, Take 20 mEq by mouth daily. , Disp: , Rfl:  .  predniSONE (DELTASONE) 10 MG tablet, Take 10 mg by mouth daily with breakfast., Disp: , Rfl:  .  senna-docusate (SENNA-PLUS) 8.6-50 MG tablet, Take 2 tablets by mouth 2 (two) times daily as needed. , Disp: , Rfl:  .  sertraline (ZOLOFT) 100 MG tablet, Take 100 mg by mouth daily. 8 pm, Disp: , Rfl:  .  sertraline (ZOLOFT) 50 MG tablet, Take 50 mg by mouth at bedtime. 8 pm, Disp: , Rfl:  .  sodium phosphate Pediatric (FLEET) 3.5-9.5 GM/59ML enema, Place 1 enema rectally once as needed for severe constipation. Take once every 3 days if constipation is not relieved by Milk of magnesia or Bisacodyl Suppository, Disp: , Rfl:  .  spironolactone (ALDACTONE) 50 MG tablet, Take 50 mg by  mouth daily., Disp: , Rfl:     ALLERGIES   Iodinated diagnostic agents; Nitrofurantoin; Omnipaque [iohexol]; Solifenacin; Ciprofloxacin; Metronidazole; and Sulfa antibiotics     REVIEW OF SYSTEMS   Review of Systems  Constitutional: Negative for chills, diaphoresis, fever, malaise/fatigue and weight loss.  HENT: Negative for congestion and hearing loss.   Eyes: Negative for blurred vision and double vision.  Respiratory: Positive for cough, sputum production, shortness of breath and wheezing.   Gastrointestinal: Negative for heartburn and nausea.  Skin: Negative for rash.  Neurological: Positive for focal weakness. Negative for weakness and headaches.  All other systems reviewed and are negative.   BP 120/60 (BP Location: Left Arm, Cuff Size: Normal)   Pulse 67   Resp 16   Ht 5\' 2"  (1.575 m)   Wt 126 lb (57.2 kg)   SpO2 93%   BMI 23.05 kg/m     PHYSICAL EXAM  Physical Exam  Constitutional: She is oriented to person, place, and time. She appears well-developed and well-nourished. No distress.  HENT:  Mouth/Throat: No oropharyngeal exudate.  Neck: Neck supple.  Cardiovascular: Normal rate, regular rhythm and normal heart sounds.   No murmur heard. Pulmonary/Chest: No stridor. No respiratory distress. She has no wheezes. She has no rales.  Musculoskeletal: She exhibits no edema.  Neurological: She is alert and oriented to person, place, and time.  Skin: Skin is warm. She is not diaphoretic.  Psychiatric: She has a normal mood and affect.          ASSESSMENT/PLAN   82 yo white female with h/o COPD and h/o CVA with chronic cough with intermittent wheezing with productive cough With acute bronchitis with mild COPD exacerbation  1.start prednisone 20 mg daily for 10 days 2.albuterol as needed, continue advair 3.contiue ST/PT 4.start z pak 5.guafenison with codiene   Follow up in 6 months  Patient/Family are satisfied with Plan of action and management.  All questions answered  Corrin Parker, M.D.  [facility]  Medical Director [place] Director [facility] Cardio-Pulmonary Department

## 2017-01-16 DIAGNOSIS — M25531 Pain in right wrist: Secondary | ICD-10-CM | POA: Diagnosis not present

## 2017-01-16 DIAGNOSIS — M542 Cervicalgia: Secondary | ICD-10-CM | POA: Diagnosis not present

## 2017-01-16 DIAGNOSIS — M79631 Pain in right forearm: Secondary | ICD-10-CM | POA: Diagnosis not present

## 2017-01-16 DIAGNOSIS — M25511 Pain in right shoulder: Secondary | ICD-10-CM | POA: Diagnosis not present

## 2017-01-16 DIAGNOSIS — M79641 Pain in right hand: Secondary | ICD-10-CM | POA: Diagnosis not present

## 2017-01-19 ENCOUNTER — Other Ambulatory Visit: Payer: Self-pay

## 2017-01-19 ENCOUNTER — Encounter: Payer: Self-pay | Admitting: Gerontology

## 2017-01-19 ENCOUNTER — Non-Acute Institutional Stay (SKILLED_NURSING_FACILITY): Payer: Medicare Other | Admitting: Gerontology

## 2017-01-19 DIAGNOSIS — S52321A Displaced transverse fracture of shaft of right radius, initial encounter for closed fracture: Secondary | ICD-10-CM | POA: Diagnosis not present

## 2017-01-19 MED ORDER — CLONAZEPAM 0.25 MG PO TBDP: 0.2500 mg | ORAL_TABLET | Freq: Every day | ORAL | 4 refills | Status: DC

## 2017-01-19 NOTE — Progress Notes (Signed)
Location:   [place] Room Number: [place] of Service:  SNF [phone]) Provider:  Toni Arthurs, NP-C  Juluis Pitch, MD  Patient Care Team: Juluis Pitch, MD as PCP - General [facility] Medicine)  Extended Emergency Contact Information Primary Emergency Contact: [name] Address: [address]          [place], [place] [address] [name] of [place] Phone: [phone] Relation: Daughter Secondary Emergency Contact: Lingerfelt,Brad Address: [address] [place], [place] [address] [name] of [place] Phone: [phone] Relation: [name]  Code Status:  DNR Goals of care: Advanced Directive information Advanced Directives 01/19/2017  Does Patient Have a Medical Advance Directive? Yes  Type of Advance Directive Out of facility DNR (pink MOST or yellow form)  Does patient want to make changes to medical advance directive? No - Patient declined  Would patient like information on creating a medical advance directive? -     Chief Complaint  Patient presents with  . Acute Visit    Follow up on arm fracture    HPI:  Pt is a 83 y.o. female seen today for an acute visit for right forearm fracture. Yesterday, pt began to complain of pain in the right arm- shoulder, elbow, wrist and hand. Pt denies known trauma of the arm. Pt reports severe pain with attempting to lift her arm. B- radial pulses equal and strong. Cap refills WNL. Pt denies numbness or tingling of the hand/fingers. Pt does report worse pain with attempting to move the thumb/ forearm rotation. Thumb is painful with A/PROM. Staff report pt had an increase in use of prn pain medications over the weekend related to the arm pain. Xrays obtained shows right radius fracture of unknown age. Over the weekend, pt declined going to the ED. Stated she would rather wait for an Orthopedic appointment as outpatient. Pt re-endorses this on today's visit. Otherwise, pt is without  complaints. VSS.   Past Medical History:  Diagnosis Date  . Adenoma of rectum   . Adrenal adenoma   . Anemia   . Anemia, unspecified   . Arthritis   . Ascending aortic aneurysm ([place])    a. s/p repair 07/2014  . Atrophic vaginitis   . Benign neoplasm of colon, unspecified   . COPD (chronic obstructive pulmonary disease) ([place])   . Coronary artery disease, non-occlusive    a. LHC 06/2014 without evidence of obstructive disease  . Depression   . Diverticulosis   . Dysphagia   . Dysrhythmia   . Essential hypertension    benign  . Frequent UTI   . GERD (gastroesophageal reflux disease)   . Hemiplegia and hemiparesis ([place])    following cerebral infarction affecting left non-dominant side  . Hypertension   . Microscopic hematuria   . Nonrheumatic aortic valve insufficiency   . Osteoporosis    unspecified  . Panic attacks   . Paroxysmal atrial fibrillation ([facility])   . Pernicious anemia   . Renal cyst    CKD STAGE 3  . Stroke ([place]) 07/23/2014   a. post-operative setting in 07/2014 leading to left UE hemiapresis and left LE weakness  . Urge incontinence    Past Surgical History:  Procedure Laterality Date  . ABDOMINAL HYSTERECTOMY  1972  . AORTOILIAC BYPASS    . ASCENDING AORTIC ANEURYSM REPAIR  07/23/2014   07/2014  . BLEPHAROPLASTY    . CATARACT EXTRACTION  2005  .  GASTROSTOMY W/ FEEDING TUBE    . INTRAMEDULLARY (IM) NAIL INTERTROCHANTERIC Left 02/26/2016   Procedure: INTRAMEDULLARY (IM) NAIL INTERTROCHANTRIC;  Surgeon: Earnestine Leys, MD;  Location: [facility] ORS;  Service: Orthopedics;  Laterality: Left;  . KYPHOPLASTY N/A 08/26/2016   Procedure: KYPHOPLASTY T12;  Surgeon: Hessie Knows, MD;  Location: [facility] ORS;  Service: Orthopedics;  Laterality: N/A;  . LEFT HEART CATH  06/2014  . PR ASCEND AORTIC GRAFT INCL VALVE SUSPENSION  07/07/2014   Procedure: ASCENDING AORTA GRAFT, WITH CARDIOPULMONARY BYPASS, WITH OR WITHOUT VALVE SUSPENSION; Surgeon: Dwaine Deter, MD; Location[facility]  [facility]; Service: Cardiothoracic  . PR LAP, GASTROTOMY W/O TUBE CONSTR  08/08/2014   Procedure: LAPAROSCOPY, SURGICAL; GASTOSTOMY W/O CONSTRUCTION OF GASTRIC TUBE (EG, STAMM PROCEDURE)(SEPARATE PROCED); Surgeon: Fredrik Rigger, MD; Location: MAIN OR [facility]; Service: Gastrointestinal  . PR PLACE PERCUT GASTROSTOMY TUBE  08/08/2014   Procedure: UGI ENDO; W/DIRECTED PLCMT PERQ GASTROSTOMY TUBE; Surgeon: Fredrik Rigger, MD; Location[facility] MA[facility] OR [facility]; Service: Gastrointestinal    Allergies  Allergen Reactions  . Iodinated Diagnostic Agents Itching and Swelling  . Nitrofurantoin Diarrhea    Other reaction(s): Unknown  . Omnipaque [Iohexol]   . Solifenacin Other (See Comments)    indigestion  . Ciprofloxacin Other (See Comments) and Rash    Colitis  . Metronidazole Itching and Rash  . Sulfa Antibiotics Itching and Rash    Allergies as of 01/19/2017      Reactions   Iodinated Diagnostic Agents Itching, Swelling   Nitrofurantoin Diarrhea   Other reaction(s): Unknown   Omnipaque [iohexol]    Solifenacin Other (See Comments)   indigestion   Ciprofloxacin Other (See Comments), Rash   Colitis   Metronidazole Itching, Rash   Sulfa Antibiotics Itching, Rash      Medication List       Accurate as of 01/19/17  4:32 PM. Always use your most recent med list.          acetaminophen 325 MG tablet Commonly known as:  TYLENOL Take 650 mg by mouth 4 (four) times daily.   albuterol 108 (90 Base) MCG/ACT inhaler Commonly known as:  PROVENTIL HFA;VENTOLIN HFA Inhale 2 puffs into the lungs every 4 (four) hours as needed for wheezing or shortness of breath.   alum & mag hydroxide-simeth 700-174-94 MG/5ML suspension Commonly known as:  MAALOX PLUS Take 30 mLs by mouth every 4 (four) hours as needed.   ASPERCREME LIDOCAINE 4 % Ptch Generic drug:  Lidocaine Apply 1 patch topically daily. Apply to right flank/sore ribs daily. Remove after 12 hours.   atorvastatin 20 MG  tablet Commonly known as:  LIPITOR Take 20 mg by mouth at bedtime.   benzonatate 100 MG capsule Commonly known as:  TESSALON Take 100 mg by mouth every 8 (eight) hours as needed for cough.   BREO ELLIPTA 100-25 MCG/INH Aepb Generic drug:  fluticasone furoate-vilanterol Inhale 1 puff into the lungs daily.   Cholecalciferol 4000 units Caps Take 1 capsule by mouth daily.   clonazePAM 0.25 MG disintegrating tablet Commonly known as:  KLONOPIN Take 1 tablet (0.25 mg total) by mouth daily.   cyanocobalamin 500 MCG tablet Take 500 mcg by mouth daily.   dextromethorphan-guaiFENesin 30-600 MG 12hr tablet Commonly known as:  MUCINEX DM Take 1 tablet by mouth 2 (two) times daily.   diclofenac sodium 1 % Gel Commonly known as:  VOLTAREN Apply 4 g topically 4 (four) times daily as needed (pain). To lumbar spine   diltiazem 60 MG tablet Commonly  known as:  CARDIZEM Take 60 mg by mouth daily.   docusate sodium 100 MG capsule Commonly known as:  COLACE Take 1 capsule (100 mg total) by mouth 2 (two) times daily.   ELIQUIS 2.5 MG Tabs tablet Generic drug:  apixaban Take 2.5 mg by mouth 2 (two) times daily.   fentaNYL 25 MCG/HR patch Commonly known as:  DURAGESIC - dosed mcg/hr Place 1 patch (25 mcg total) onto the skin every 3 (three) days.   ferrous sulfate 325 (65 FE) MG tablet Take 1 tablet (325 mg total) by mouth 2 (two) times daily with a meal.   fluticasone 50 MCG/ACT nasal spray Commonly known as:  FLONASE Place 1 spray into both nostrils daily.   furosemide 40 MG tablet Commonly known as:  LASIX Take 40 mg by mouth daily.   gabapentin 100 MG capsule Commonly known as:  NEURONTIN Take 200 mg by mouth 3 (three) times daily. 2 caps   guaiFENesin-codeine 100-10 MG/5ML syrup Commonly known as:  ROBITUSSIN AC Take 5 mLs by mouth every 6 (six) hours as needed for cough.   ipratropium-albuterol 0.5-2.5 (3) MG/3ML Soln Commonly known as:  DUONEB Take 3 mLs by  nebulization 3 (three) times daily.   magnesium hydroxide 400 MG/5ML suspension Commonly known as:  MILK OF MAGNESIA Take 30 mLs by mouth daily as needed for mild constipation.   meloxicam 7.5 MG tablet Commonly known as:  MOBIC Take 7.5 mg by mouth 2 (two) times daily.   methocarbamol 500 MG tablet Commonly known as:  ROBAXIN Take 250 mg by mouth 3 (three) times daily. 1/2 tablet   metoCLOPramide 5 MG tablet Commonly known as:  REGLAN Take 5 mg by mouth 2 (two) times daily.   omeprazole 40 MG capsule Commonly known as:  PRILOSEC Take 40 mg by mouth daily.   oxycodone 5 MG capsule Commonly known as:  OXY-IR Take 5 mg by mouth every 4 (four) hours as needed. For moderate or severe pain   OXYGEN Inhale 2 L into the lungs continuous as needed.   polyethylene glycol packet Commonly known as:  MIRALAX Take 17 g by mouth 2 (two) times daily.   polyvinyl alcohol-povidone 1.4-0.6 % ophthalmic solution Commonly known as:  HYPOTEARS Place 1 drop into both eyes 4 (four) times daily as needed (dry eyes).   potassium chloride SA 20 MEQ tablet Commonly known as:  K-DUR,KLOR-CON Take 20 mEq by mouth daily.   predniSONE 10 MG tablet Commonly known as:  DELTASONE Take 10 mg by mouth daily with breakfast.   predniSONE 20 MG tablet Commonly known as:  DELTASONE Take 1 tablet (20 mg total) by mouth daily.   REFRESH LACRI-LUBE Oint Place 1/4 ribbon in both eyes at bedtime for dry eyes   REFRESH TEARS 0.5 % Soln Generic drug:  carboxymethylcellulose Place 2 drops into both eyes See admin instructions. 1 to 4 times daily prn   SENNA-PLUS 8.6-50 MG tablet Generic drug:  senna-docusate Take 2 tablets by mouth 2 (two) times daily as needed.   sertraline 100 MG tablet Commonly known as:  ZOLOFT Take 100 mg by mouth daily. 8 pm   sertraline 50 MG tablet Commonly known as:  ZOLOFT Take 50 mg by mouth at bedtime. 8 pm   sodium phosphate Pediatric 3.5-9.5 GM/59ML enema Place 1  enema rectally once as needed for severe constipation. Take once every 3 days if constipation is not relieved by Milk of magnesia or Bisacodyl Suppository   spironolactone 50 MG  tablet Commonly known as:  ALDACTONE Take 50 mg by mouth daily.       Review of Systems  Constitutional: Negative for activity change, appetite change, chills, diaphoresis, fatigue and fever.  HENT: Negative.   Respiratory: Negative.   Cardiovascular: Negative.   Gastrointestinal: Negative.   Genitourinary: Negative.   Musculoskeletal: Positive for arthralgias and myalgias. Negative for back pain and joint swelling.  Skin: Negative.   Neurological: Negative.   Psychiatric/Behavioral: Negative.   All other systems reviewed and are negative.   Immunization History  Administered Date(s) Administered  . Influenza Inj Mdck Quad Pf 02/14/2016  . Influenza-Unspecified 02/02/2014  . PPD Test 08/10/2014, 08/24/2014, 02/20/2016  . Pneumococcal-Unspecified 02/20/2016   Pertinent  Health Maintenance Due  Topic Date Due  . DEXA SCAN  07/01/1992  . INFLUENZA VACCINE  12/03/2016  . PNA vac Low Risk Adult (2 of 2 - PCV13) 02/19/2017   No flowsheet data found. Functional Status Survey:    Vitals:   01/19/17 1617  BP: (!) 119/52  Pulse: 72  Resp: 20  Temp: 98 F (36.7 C)  SpO2: 95%  Weight: 128 lb 8 oz (58.3 kg)  Height: 5\' 2"  (1.575 m)   Body mass index is 23.5 kg/m. Physical Exam  Constitutional: She is oriented to person, place, and time. She appears well-developed and well-nourished. No distress.  HENT:  Head: Normocephalic.  Eyes: Pupils are equal, round, and reactive to light.  Neck: No JVD present.  Pulmonary/Chest: Effort normal.  Musculoskeletal:       Right forearm: She exhibits tenderness, bony tenderness, swelling and deformity.  B-radial pulses equal and strong. Cap refills WNL. Compartments soft, supple.   Neurological: She is alert and oriented to person, place, and time.  Skin:  Skin is warm and dry. No rash noted. She is not diaphoretic. No erythema. No pallor.  Psychiatric: She has a normal mood and affect. Her behavior is normal. Judgment and thought content normal.    Labs reviewed:  Recent Labs  07/21/16 1500 08/22/16 1036 09/04/16 1700 10/09/16 1500  NA 138  --  140 138  K 4.5 4.9 4.7 5.3*  CL 102  --  105 104  CO2 27  --  29 28  GLUCOSE 202*  --  106* 121*  BUN 18  --  25* 26*  CREATININE 1.28*  --  1.18* 1.43*  CALCIUM 8.4*  --  8.7* 9.0  MG  --   --  2.1  --     Recent Labs  07/21/16 1500 09/04/16 1700 10/09/16 1500  AST 19 18 19   ALT 14 15 12*  ALKPHOS 113 109 88  BILITOT 0.7 0.8 0.8  PROT 5.7* 5.7* 5.6*  ALBUMIN 3.7 3.8 3.7    Recent Labs  07/21/16 1500 09/04/16 1700 10/09/16 1500  WBC 11.1* 7.6 8.6  NEUTROABS 9.9* 6.3 7.7*  HGB 11.2* 9.3* 10.8*  HCT 34.1* 28.1* 32.4*  MCV 90.8 90.7 91.5  PLT 207 204 181   Lab Results  Component Value Date   TSH 2.985 09/04/2016   Lab Results  Component Value Date   HGBA1C 5.9 (H) 02/25/2016   Lab Results  Component Value Date   CHOL 127 02/09/2015   HDL 72 02/09/2015   LDLCALC 49 02/09/2015   TRIG 28 02/09/2015   CHOLHDL 1.8 02/09/2015    Significant Diagnostic Results in last 30 days:  No results found.  Assessment/Plan 1. Closed displaced transverse fracture of shaft of right radius, initial encounter  Refer to first available orthopedist for evaluation  Pain control as ordered  Ice prn to area of fracture  Provide support for arm, minimize movement  Family/ staff Communication:   Total Time:  Documentation:  Face to Face:  Family/Phone:   Labs/tests ordered: Xrays of C-spine, Right shoulder, Right elbow, Right forearm, Right wrist/hand (all negative except forearm)  Medication list reviewed and assessed for continued appropriateness.  Vikki Ports, NP-C Geriatrics [facility] [phone], [place]  [address] Cell Phone (Mon-Fri 8am-5pm):  [phone] On Call:  [phone] & follow prompts after 5pm & weekends Office Phone:  [phone] Office Fax:  [phone]

## 2017-01-19 NOTE — Telephone Encounter (Signed)
Rx sent to [facility] phone : [phone] , fax : [phone]  

## 2017-01-21 DIAGNOSIS — S52331A Displaced oblique fracture of shaft of right radius, initial encounter for closed fracture: Secondary | ICD-10-CM | POA: Diagnosis not present

## 2017-01-30 ENCOUNTER — Encounter: Payer: Self-pay | Admitting: Gerontology

## 2017-01-30 ENCOUNTER — Other Ambulatory Visit: Payer: Self-pay

## 2017-01-30 ENCOUNTER — Non-Acute Institutional Stay (SKILLED_NURSING_FACILITY): Payer: Medicare Other | Admitting: Gerontology

## 2017-01-30 DIAGNOSIS — S52321D Displaced transverse fracture of shaft of right radius, subsequent encounter for closed fracture with routine healing: Secondary | ICD-10-CM | POA: Diagnosis not present

## 2017-01-30 MED ORDER — CLONAZEPAM 0.25 MG PO TBDP
0.1250 mg | ORAL_TABLET | Freq: Every day | ORAL | 0 refills | Status: DC
Start: 2017-01-30 — End: 2017-03-25

## 2017-01-30 NOTE — Telephone Encounter (Signed)
Rx sent to [facility] phone : [phone] , fax : [phone]  

## 2017-01-30 NOTE — Progress Notes (Signed)
Location:    Nursing Home Room Number: 319A Place of Service:  SNF (31) Provider:  Toni Arthurs, NP-C  Juluis Pitch, MD  Patient Care Team: Juluis Pitch, MD as PCP - General [facility])  Extended Emergency Contact Information Primary Emergency Contact: [name] Address: [address]          [place], [place] [address] [name] of [place] Phone: [phone] Relation: Daughter Secondary Emergency Contact: Lingerfelt,Brad Address: [address] [place], [place] [address] [name] of [place] Phone: [phone] Relation: [name]  Code Status:  DNR Goals of care: Advanced Directive information Advanced Directives 01/30/2017  Does Patient Have a Medical Advance Directive? Yes  Type of Advance Directive Out of facility DNR (pink MOST or yellow form)  Does patient want to make changes to medical advance directive? No - Patient declined  Would patient like information on creating a medical advance directive? -     Chief Complaint  Patient presents with  . Acute Visit    Follow up on splint    HPI:  Pt is a 83 y.o. female seen today for an acute visit for adjustment of the brace. Pt c/o the edge of the plaster splint irritating her hand. She has tried tucking a tissue in the splint to pad the area. This helps for a short time, but is still sore. Acewrap removed. Splint adjusted. I found the splint had been put on ontop of a Lidocaine patch that had been in place since the day the splint was placed as well as an Allevyn patch covering a skin tear. The Allevyn patch was removed and skin inspected. The skin tear was dry and scabbed over. No redness, no pain, no warmth, no drainage from the skin tear. Left Allevyn off. Skin under the Lidocaine patch intact without issue. Duoderm patch was cut in half and a piece was placed on the dorsal aspect of the hand where the edge of the splint was rubbing in order to shield/pad the skin/knuckles.  Splint replaced. Acewrap rewrapped. Pt reports harm feels significantly better after splint adjustment. VSS. No other complaints.    Past Medical History:  Diagnosis Date  . Adenoma of rectum   . Adrenal adenoma   . Anemia   . Anemia, unspecified   . Arthritis   . Ascending aortic aneurysm ([place])    a. s/p repair 07/2014  . Atrophic vaginitis   . Benign neoplasm of colon, unspecified   . COPD (chronic obstructive pulmonary disease) ([place])   . Coronary artery disease, non-occlusive    a. LHC 06/2014 without evidence of obstructive disease  . Depression   . Diverticulosis   . Dysphagia   . Dysrhythmia   . Essential hypertension    benign  . Frequent UTI   . GERD (gastroesophageal reflux disease)   . Hemiplegia and hemiparesis ([place])    following cerebral infarction affecting left non-dominant side  . Hypertension   . Microscopic hematuria   . Nonrheumatic aortic valve insufficiency   . Osteoporosis    unspecified  . Panic attacks   . Paroxysmal atrial fibrillation (H[facility])   . Pernicious anemia   . Renal cyst    CKD STAGE 3  . Stroke ([place]) 07/23/2014   a. post-operative setting in 07/2014 leading to left UE hemiapresis and left LE weakness  . Urge incontinence    Past Surgical History:  Procedure Laterality Date  . ABDOMINAL HYSTERECTOMY  1972  .  AORTOILIAC BYPASS    . ASCENDING AORTIC ANEURYSM REPAIR  07/23/2014   07/2014  . BLEPHAROPLASTY    . CATARACT EXTRACTION  2005  . GASTROSTOMY W/ FEEDING TUBE    . INTRAMEDULLARY (IM) NAIL INTERTROCHANTERIC Left 02/26/2016   Procedure: INTRAMEDULLARY (IM) NAIL INTERTROCHANTRIC;  Surgeon: Earnestine Leys, MD;  Location: [facility] ORS;  Service: Orthopedics;  Laterality: Left;  . KYPHOPLASTY N/A 08/26/2016   Procedure: KYPHOPLASTY T12;  Surgeon: Hessie Knows, MD;  Location: [facility] ORS;  Service: Orthopedics;  Laterality: N/A;  . LEFT HEART CATH  06/2014  . PR ASCEND AORTIC GRAFT INCL VALVE SUSPENSION  07/07/2014   Procedure: ASCENDING AORTA  GRAFT, WITH CARDIOPULMONARY BYPASS, WITH OR WITHOUT VALVE SUSPENSION; Surgeon: Dwaine Deter, MD; Location: MAIN OR [facility]; Service: Cardiothoracic  . PR LAP, GASTROTOMY W/O TUBE CONSTR  08/08/2014   Procedure: LAPAROSCOPY, SURGICAL; GASTOSTOMY W/O CONSTRUCTION OF GASTRIC TUBE (EG, STAMM PROCEDURE)(SEPARATE PROCED); Surgeon: Fredrik Rigger, MD; Location: MAIN OR [facility]; Service: Gastrointestinal  . PR PLACE PERCUT GASTROSTOMY TUBE  08/08/2014   Procedure: UGI ENDO; W/DIRECTED PLCMT PERQ GASTROSTOMY TUBE; Surgeon: Fredrik Rigger, MD; Location: MAIN OR [facility]; Service: Gastrointestinal    Allergies  Allergen Reactions  . Iodinated Diagnostic Agents Itching and Swelling  . Nitrofurantoin Diarrhea    Other reaction(s): Unknown  . Omnipaque [Iohexol]   . Solifenacin Other (See Comments)    indigestion  . Ciprofloxacin Other (See Comments) and Rash    Colitis  . Metronidazole Itching and Rash  . Sulfa Antibiotics Itching and Rash    Allergies as of 01/30/2017      Reactions   Iodinated Diagnostic Agents Itching, Swelling   Nitrofurantoin Diarrhea   Other reaction(s): Unknown   Omnipaque [iohexol]    Solifenacin Other (See Comments)   indigestion   Ciprofloxacin Other (See Comments), Rash   Colitis   Metronidazole Itching, Rash   Sulfa Antibiotics Itching, Rash      Medication List       Accurate as of 01/30/17  4:30 PM. Always use your most recent med list.          acetaminophen 325 MG tablet Commonly known as:  TYLENOL Take 650 mg by mouth 4 (four) times daily.   albuterol 108 (90 Base) MCG/ACT inhaler Commonly known as:  PROVENTIL HFA;VENTOLIN HFA Inhale 2 puffs into the lungs every 4 (four) hours as needed for wheezing or shortness of breath.   alum & mag hydroxide-simeth 500-938-18 MG/5ML suspension Commonly known as:  MAALOX PLUS Take 30 mLs by mouth every 4 (four) hours as needed.   ASPERCREME LIDOCAINE 4 % Ptch Generic drug:  Lidocaine Apply 1  patch topically daily. Apply to right flank/sore ribs daily. Remove after 12 hours.   atorvastatin 20 MG tablet Commonly known as:  LIPITOR Take 20 mg by mouth at bedtime.   benzonatate 100 MG capsule Commonly known as:  TESSALON Take 100 mg by mouth every 8 (eight) hours as needed for cough.   BREO ELLIPTA 100-25 MCG/INH Aepb Generic drug:  fluticasone furoate-vilanterol Inhale 1 puff into the lungs daily.   Cholecalciferol 4000 units Caps Take 1 capsule by mouth daily.   clonazePAM 0.25 MG disintegrating tablet Commonly known as:  KLONOPIN Take 0.5 tablets (0.125 mg total) by mouth daily. Note Dose   cyanocobalamin 500 MCG tablet Take 500 mcg by mouth daily.   dextromethorphan-guaiFENesin 30-600 MG 12hr tablet Commonly known as:  MUCINEX DM Take 1 tablet by mouth 2 (two) times daily.   diclofenac  sodium 1 % Gel Commonly known as:  VOLTAREN Apply 4 g topically 4 (four) times daily as needed (pain). To lumbar spine   diltiazem 60 MG tablet Commonly known as:  CARDIZEM Take 60 mg by mouth daily.   docusate sodium 100 MG capsule Commonly known as:  COLACE Take 1 capsule (100 mg total) by mouth 2 (two) times daily.   ELIQUIS 2.5 MG Tabs tablet Generic drug:  apixaban Take 2.5 mg by mouth 2 (two) times daily.   fentaNYL 25 MCG/HR patch Commonly known as:  DURAGESIC - dosed mcg/hr Place 1 patch (25 mcg total) onto the skin every 3 (three) days.   ferrous sulfate 325 (65 FE) MG tablet Take 1 tablet (325 mg total) by mouth 2 (two) times daily with a meal.   fluticasone 50 MCG/ACT nasal spray Commonly known as:  FLONASE Place 1 spray into both nostrils daily.   furosemide 40 MG tablet Commonly known as:  LASIX Take 40 mg by mouth daily.   gabapentin 100 MG capsule Commonly known as:  NEURONTIN Take 200 mg by mouth 3 (three) times daily. 2 caps   guaiFENesin-codeine 100-10 MG/5ML syrup Commonly known as:  ROBITUSSIN AC Take 5 mLs by mouth every 6 (six) hours as  needed for cough.   ipratropium-albuterol 0.5-2.5 (3) MG/3ML Soln Commonly known as:  DUONEB Take 3 mLs by nebulization 3 (three) times daily.   magnesium hydroxide 400 MG/5ML suspension Commonly known as:  MILK OF MAGNESIA Take 30 mLs by mouth daily as needed for mild constipation.   meloxicam 7.5 MG tablet Commonly known as:  MOBIC Take 7.5 mg by mouth 2 (two) times daily.   methocarbamol 500 MG tablet Commonly known as:  ROBAXIN Take 250 mg by mouth 3 (three) times daily. 1/2 tablet   metoCLOPramide 5 MG tablet Commonly known as:  REGLAN Take 5 mg by mouth 2 (two) times daily.   omeprazole 40 MG capsule Commonly known as:  PRILOSEC Take 40 mg by mouth daily.   oxycodone 5 MG capsule Commonly known as:  OXY-IR Take 5 mg by mouth every 4 (four) hours as needed. For moderate or severe pain   OXYGEN Inhale 2 L into the lungs continuous as needed.   polyethylene glycol packet Commonly known as:  MIRALAX Take 17 g by mouth 2 (two) times daily.   polyvinyl alcohol-povidone 1.4-0.6 % ophthalmic solution Commonly known as:  HYPOTEARS Place 1 drop into both eyes 4 (four) times daily as needed (dry eyes).   potassium chloride SA 20 MEQ tablet Commonly known as:  K-DUR,KLOR-CON Take 20 mEq by mouth daily.   predniSONE 10 MG tablet Commonly known as:  DELTASONE Take 10 mg by mouth daily with breakfast.   REFRESH LACRI-LUBE Oint Place 1/4 ribbon in both eyes at bedtime for dry eyes   REFRESH TEARS 0.5 % Soln Generic drug:  carboxymethylcellulose Place 2 drops into both eyes See admin instructions. 1 to 4 times daily prn   SENNA-PLUS 8.6-50 MG tablet Generic drug:  senna-docusate Take 2 tablets by mouth 2 (two) times daily as needed.   sertraline 100 MG tablet Commonly known as:  ZOLOFT Take 100 mg by mouth daily. 8 pm   sertraline 50 MG tablet Commonly known as:  ZOLOFT Take 50 mg by mouth at bedtime. 8 pm   sodium phosphate Pediatric 3.5-9.5 GM/59ML  enema Place 1 enema rectally once as needed for severe constipation. Take once every 3 days if constipation is not relieved by Milk  of magnesia or Bisacodyl Suppository   spironolactone 50 MG tablet Commonly known as:  ALDACTONE Take 50 mg by mouth daily.       Review of Systems  HENT: Negative.   Respiratory: Negative.   Cardiovascular: Negative.   Gastrointestinal: Negative.   Genitourinary: Negative.   Musculoskeletal: Positive for arthralgias and myalgias.  Skin: Positive for wound.  Neurological: Positive for facial asymmetry, speech difficulty and weakness.  Psychiatric/Behavioral: Negative.   All other systems reviewed and are negative.   Immunization History  Administered Date(s) Administered  . Influenza Inj Mdck Quad Pf 02/14/2016  . Influenza-Unspecified 02/02/2014  . PPD Test 08/10/2014, 08/24/2014, 02/20/2016  . Pneumococcal-Unspecified 02/20/2016   Pertinent  Health Maintenance Due  Topic Date Due  . DEXA SCAN  07/01/1992  . INFLUENZA VACCINE  12/03/2016  . PNA vac Low Risk Adult (2 of 2 - PCV13) 02/19/2017   No flowsheet data found. Functional Status Survey:    Vitals:   01/30/17 1618  BP: (!) 119/52  Pulse: 72  Resp: 20  Temp: 98 F (36.7 C)  SpO2: 95%  Weight: 133 lb 12.8 oz (60.7 kg)  Height: 5\' 2"  (1.575 m)   Body mass index is 24.47 kg/m. Physical Exam  Constitutional: She is oriented to person, place, and time. She appears well-developed and well-nourished. No distress.  HENT:  Head: Normocephalic.  Eyes: Pupils are equal, round, and reactive to light.  Neck: No JVD present.  Cardiovascular: Normal rate, regular rhythm, S1 normal, S2 normal and normal heart sounds.  Exam reveals no gallop, no distant heart sounds and no friction rub.   No murmur heard. Pulses:      Radial pulses are 2+ on the right side, and 2+ on the left side.  Pulmonary/Chest: Effort normal and breath sounds normal.  Musculoskeletal:       Right forearm: She  exhibits tenderness. She exhibits no bony tenderness, no swelling, no edema, no deformity and no laceration.  B-radial pulses equal and strong. Cap refills WNL. Compartments soft, supple.   Neurological: She is alert and oriented to person, place, and time.  Skin: Skin is warm and dry. No rash noted. She is not diaphoretic. No erythema. No pallor.     Psychiatric: She has a normal mood and affect. Her behavior is normal. Judgment and thought content normal.    Labs reviewed:  Recent Labs  07/21/16 1500 08/22/16 1036 09/04/16 1700 10/09/16 1500  NA 138  --  140 138  K 4.5 4.9 4.7 5.3*  CL 102  --  105 104  CO2 27  --  29 28  GLUCOSE 202*  --  106* 121*  BUN 18  --  25* 26*  CREATININE 1.28*  --  1.18* 1.43*  CALCIUM 8.4*  --  8.7* 9.0  MG  --   --  2.1  --     Recent Labs  07/21/16 1500 09/04/16 1700 10/09/16 1500  AST 19 18 19   ALT 14 15 12*  ALKPHOS 113 109 88  BILITOT 0.7 0.8 0.8  PROT 5.7* 5.7* 5.6*  ALBUMIN 3.7 3.8 3.7    Recent Labs  07/21/16 1500 09/04/16 1700 10/09/16 1500  WBC 11.1* 7.6 8.6  NEUTROABS 9.9* 6.3 7.7*  HGB 11.2* 9.3* 10.8*  HCT 34.1* 28.1* 32.4*  MCV 90.8 90.7 91.5  PLT 207 204 181   Lab Results  Component Value Date   TSH 2.985 09/04/2016   Lab Results  Component Value Date   HGBA1C  5.9 (H) 02/25/2016   Lab Results  Component Value Date   CHOL 127 02/09/2015   HDL 72 02/09/2015   LDLCALC 49 02/09/2015   TRIG 28 02/09/2015   CHOLHDL 1.8 02/09/2015    Significant Diagnostic Results in last 30 days:  No results found.  Assessment/Plan 1. Closed displaced transverse fracture of shaft of right radius with routine healing, subsequent encounter  Duoderm placed on dorsal aspect of hand. Leave in place until splint changed  However, ok to change duoderm if it comes off or becomes soiled  Re-wrapped splint  Follow up with Orthopedist as instructed  Family/ staff Communication:   Total Time:  Documentation:  Face to  Face:  Family/Phone:   Labs/tests ordered:   Medication list reviewed and assessed for continued appropriateness.  Vikki Ports, NP-C [facility] [id number], [place] [address] Cell Phone (Mon-Fri 8am-5pm):  [phone] On Call:  [phone] & follow prompts after 5pm & weekends Office Phone:  [phone] Office Fax:  [phone]

## 2017-02-02 ENCOUNTER — Encounter
Admission: RE | Admit: 2017-02-02 | Discharge: 2017-02-02 | Disposition: A | Discharge status: Home / Self Care | Payer: Medicare Other | Source: Ambulatory Visit | Attending: Internal Medicine | Admitting: Internal Medicine

## 2017-02-04 ENCOUNTER — Non-Acute Institutional Stay (SKILLED_NURSING_FACILITY): Payer: Medicare Other | Admitting: Gerontology

## 2017-02-04 ENCOUNTER — Encounter: Payer: Self-pay | Admitting: Gerontology

## 2017-02-04 DIAGNOSIS — F325 Major depressive disorder, single episode, in full remission: Secondary | ICD-10-CM

## 2017-02-04 DIAGNOSIS — G4700 Insomnia, unspecified: Secondary | ICD-10-CM

## 2017-02-04 DIAGNOSIS — I69354 Hemiplegia and hemiparesis following cerebral infarction affecting left non-dominant side: Secondary | ICD-10-CM | POA: Diagnosis not present

## 2017-02-04 DIAGNOSIS — D510 Vitamin B12 deficiency anemia due to intrinsic factor deficiency: Secondary | ICD-10-CM

## 2017-02-04 DIAGNOSIS — I129 Hypertensive chronic kidney disease with stage 1 through stage 4 chronic kidney disease, or unspecified chronic kidney disease: Secondary | ICD-10-CM

## 2017-02-04 DIAGNOSIS — Z87891 Personal history of nicotine dependence: Secondary | ICD-10-CM

## 2017-02-04 DIAGNOSIS — M810 Age-related osteoporosis without current pathological fracture: Secondary | ICD-10-CM

## 2017-02-04 DIAGNOSIS — K30 Functional dyspepsia: Secondary | ICD-10-CM

## 2017-02-04 DIAGNOSIS — K3184 Gastroparesis: Secondary | ICD-10-CM

## 2017-02-04 DIAGNOSIS — G8929 Other chronic pain: Secondary | ICD-10-CM

## 2017-02-04 DIAGNOSIS — I7121 Aneurysm of the ascending aorta, without rupture: Secondary | ICD-10-CM

## 2017-02-04 DIAGNOSIS — H04123 Dry eye syndrome of bilateral lacrimal glands: Secondary | ICD-10-CM

## 2017-02-04 DIAGNOSIS — K219 Gastro-esophageal reflux disease without esophagitis: Secondary | ICD-10-CM

## 2017-02-04 DIAGNOSIS — F419 Anxiety disorder, unspecified: Secondary | ICD-10-CM

## 2017-02-04 DIAGNOSIS — I69391 Dysphagia following cerebral infarction: Secondary | ICD-10-CM | POA: Diagnosis not present

## 2017-02-04 DIAGNOSIS — E785 Hyperlipidemia, unspecified: Secondary | ICD-10-CM

## 2017-02-04 DIAGNOSIS — Z9181 History of falling: Secondary | ICD-10-CM

## 2017-02-04 DIAGNOSIS — I712 Thoracic aortic aneurysm, without rupture: Secondary | ICD-10-CM

## 2017-02-04 DIAGNOSIS — R41841 Cognitive communication deficit: Secondary | ICD-10-CM

## 2017-02-04 DIAGNOSIS — G510 Bell's palsy: Secondary | ICD-10-CM

## 2017-02-04 DIAGNOSIS — I351 Nonrheumatic aortic (valve) insufficiency: Secondary | ICD-10-CM

## 2017-02-04 DIAGNOSIS — I10 Essential (primary) hypertension: Secondary | ICD-10-CM

## 2017-02-04 DIAGNOSIS — I480 Paroxysmal atrial fibrillation: Secondary | ICD-10-CM

## 2017-02-04 DIAGNOSIS — N183 Chronic kidney disease, stage 3 unspecified: Secondary | ICD-10-CM

## 2017-02-04 DIAGNOSIS — R1312 Dysphagia, oropharyngeal phase: Secondary | ICD-10-CM | POA: Diagnosis not present

## 2017-02-04 DIAGNOSIS — R0789 Other chest pain: Secondary | ICD-10-CM

## 2017-02-04 DIAGNOSIS — J449 Chronic obstructive pulmonary disease, unspecified: Secondary | ICD-10-CM

## 2017-02-04 DIAGNOSIS — J309 Allergic rhinitis, unspecified: Secondary | ICD-10-CM

## 2017-02-04 NOTE — Progress Notes (Signed)
Opened in error; Disregard.

## 2017-02-04 NOTE — Progress Notes (Signed)
Location:   [facility] Room Number: [place] of Service:  SNF [phone]) Provider:  Toni Arthurs, NP-C  Juluis Pitch, MD  Patient Care Team: Juluis Pitch, MD as PCP - [facility])  Extended Emergency Contact Information Primary Emergency Contact: [name] Address: [address]          [place], [place] [address] [name] of [place] Phone: [phone] Relation: Daughter Secondary Emergency Contact: Lingerfelt,Brad Address: [place], [place] [address] [name] of [place] Phone: [phone] Relation: [name]  Code Status:  Full Goals of care: Advanced Directive information Advanced Directives 02/04/2017  Does Patient Have a Medical Advance Directive? Yes  Type of Advance Directive Out of facility DNR (pink MOST or yellow form)  Does patient want to make changes to medical advance directive? No - Patient declined  Would patient like information on creating a medical advance directive? -     Chief Complaint  Patient presents with  . Medical Management of Chronic Issues    Routine Visit    HPI:  Pt is a 83 y.o. female seen today for medical management of chronic diseases.  Patient has multiple medical issues, mainly related to pain.  Patient is on chronic p.o. steroids and inhalers for COPD and Bell's palsy.  As a result patient has had multiple wedge compression fractures of the lumbar vertebral spine.  Patient reports her appetite is good, having regular BMs, voiding without difficulty.  She is mobile in a wheelchair.  She does socialize with other residents consistently.  She seems to enjoy working the positives.  She has not had any falls this past month.  No complaints of pain beyond the normal at this time.  She has intermittent flares of COPD that tend to not resolve quickly.  Aside from the chronic pain-which has improved, patient has no complaints at this time patient denies  n/v/d/f/c/cp/sob/ha/abd pain/dizziness/cough.  Vital signs stable.  No other complaints.   Past Medical History:  Diagnosis Date  . Adenoma of rectum   . Adrenal adenoma   . Anemia   . Anemia, unspecified   . Arthritis   . Ascending aortic aneurysm ([place])    a. s/p repair 07/2014  . Atrophic vaginitis   . Benign neoplasm of colon, unspecified   . COPD (chronic obstructive pulmonary disease) ([place])   . Coronary artery disease, non-occlusive    a. LHC 06/2014 without evidence of obstructive disease  . Depression   . Diverticulosis   . Dysphagia   . Dysrhythmia   . Essential hypertension    benign  . Frequent UTI   . GERD (gastroesophageal reflux disease)   . Hemiplegia and hemiparesis ([place])    following cerebral infarction affecting left non-dominant side  . Hypertension   . Microscopic hematuria   . Nonrheumatic aortic valve insufficiency   . Osteoporosis    unspecified  . Panic attacks   . Paroxysmal atrial fibrillation (HCC)   . Pernicious anemia   . Renal cyst    CKD STAGE 3  . Stroke ([place]) 07/23/2014   a. post-operative setting in 07/2014 leading to left UE hemiapresis and left LE weakness  . Urge incontinence    Past Surgical History:  Procedure Laterality Date  . ABDOMINAL HYSTERECTOMY  1972  . AORTOILIAC BYPASS    . ASCENDING AORTIC ANEURYSM REPAIR  07/23/2014   07/2014  . BLEPHAROPLASTY    .  CATARACT EXTRACTION  2005  . GASTROSTOMY W/ FEEDING TUBE    . INTRAMEDULLARY (IM) NAIL INTERTROCHANTERIC Left 02/26/2016   Procedure: INTRAMEDULLARY (IM) NAIL INTERTROCHANTRIC;  Surgeon: Earnestine Leys, MD;  Location: [facility] ORS;  Service: Orthopedics;  Laterality: Left;  . KYPHOPLASTY N/A 08/26/2016   Procedure: KYPHOPLASTY T12;  Surgeon: Hessie Knows, MD;  Location: [facility] ORS;  Service: Orthopedics;  Laterality: N/A;  . LEFT HEART CATH  06/2014  . PR ASCEND AORTIC GRAFT INCL VALVE SUSPENSION  07/07/2014   Procedure: ASCENDING AORTA GRAFT, WITH CARDIOPULMONARY BYPASS, WITH OR  WITHOUT VALVE SUSPENSION; Surgeon: Dwaine Deter, MD; Location: MAIN OR [facility]; Service: Cardiothoracic  . PR LAP, GASTROTOMY W/O TUBE CONSTR  08/08/2014   Procedure: LAPAROSCOPY, SURGICAL; GASTOSTOMY W/O CONSTRUCTION OF GASTRIC TUBE (EG, STAMM PROCEDURE)(SEPARATE PROCED); Surgeon: Fredrik Rigger, MD; Location: MAIN OR [facility]; Service: Gastrointestinal  . PR PLACE PERCUT GASTROSTOMY TUBE  08/08/2014   Procedure: UGI ENDO; W/DIRECTED PLCMT PERQ GASTROSTOMY TUBE; Surgeon: Fredrik Rigger, MD; Location: [facility] OR [facility]; Service: Gastrointestinal    Allergies  Allergen Reactions  . Iodinated Diagnostic Agents Itching and Swelling  . Nitrofurantoin Diarrhea    Other reaction(s): Unknown  . Omnipaque [Iohexol]   . Solifenacin Other (See Comments)    indigestion  . Ciprofloxacin Other (See Comments) and Rash    Colitis  . Metronidazole Itching and Rash  . Sulfa Antibiotics Itching and Rash    Allergies as of 02/04/2017      Reactions   Iodinated Diagnostic Agents Itching, Swelling   Nitrofurantoin Diarrhea   Other reaction(s): Unknown   Omnipaque [iohexol]    Solifenacin Other (See Comments)   indigestion   Ciprofloxacin Other (See Comments), Rash   Colitis   Metronidazole Itching, Rash   Sulfa Antibiotics Itching, Rash      Medication List       Accurate as of 02/04/17  9:21 AM. Always use your most recent med list.          acetaminophen 325 MG tablet Commonly known as:  TYLENOL Take 650 mg by mouth 4 (four) times daily.   albuterol 108 (90 Base) MCG/ACT inhaler Commonly known as:  PROVENTIL HFA;VENTOLIN HFA Inhale 2 puffs into the lungs every 4 (four) hours as needed for wheezing or shortness of breath.   alum & mag hydroxide-simeth 229-798-92 MG/5ML suspension Commonly known as:  MAALOX PLUS Take 30 mLs by mouth every 4 (four) hours as needed.   ASPERCREME LIDOCAINE 4 % Ptch Generic drug:  Lidocaine Apply 1 patch topically daily. Apply to right  flank/sore ribs daily. Remove after 12 hours.   Lidocaine 4 % Ptch Apply 1 patch topically daily. Apply to right proximal forearm. Remove after 12 hours   atorvastatin 20 MG tablet Commonly known as:  LIPITOR Take 20 mg by mouth at bedtime.   benzonatate 100 MG capsule Commonly known as:  TESSALON Take 100 mg by mouth every 8 (eight) hours as needed for cough.   BREO ELLIPTA 100-25 MCG/INH Aepb Generic drug:  fluticasone furoate-vilanterol Inhale 1 puff into the lungs daily.   Cholecalciferol 4000 units Caps Take 1 capsule by mouth daily.   clonazePAM 0.25 MG disintegrating tablet Commonly known as:  KLONOPIN Take 0.5 tablets (0.125 mg total) by mouth daily. Note Dose   cyanocobalamin 500 MCG tablet Take 500 mcg by mouth daily.   dextromethorphan-guaiFENesin 30-600 MG 12hr tablet Commonly known as:  MUCINEX DM Take 1 tablet by mouth 2 (two) times daily.   diclofenac sodium 1 %  Gel Commonly known as:  VOLTAREN Apply 4 g topically 4 (four) times daily as needed (pain). To lumbar spine   diltiazem 60 MG tablet Commonly known as:  CARDIZEM Take 60 mg by mouth daily.   docusate sodium 100 MG capsule Commonly known as:  COLACE Take 1 capsule (100 mg total) by mouth 2 (two) times daily.   ELIQUIS 2.5 MG Tabs tablet Generic drug:  apixaban Take 2.5 mg by mouth 2 (two) times daily.   fentaNYL 25 MCG/HR patch Commonly known as:  DURAGESIC - dosed mcg/hr Place 1 patch (25 mcg total) onto the skin every 3 (three) days.   ferrous sulfate 325 (65 FE) MG tablet Take 1 tablet (325 mg total) by mouth 2 (two) times daily with a meal.   fluticasone 50 MCG/ACT nasal spray Commonly known as:  FLONASE Place 1 spray into both nostrils daily.   furosemide 40 MG tablet Commonly known as:  LASIX Take 40 mg by mouth daily.   gabapentin 100 MG capsule Commonly known as:  NEURONTIN Take 200 mg by mouth 3 (three) times daily. 2 caps   guaiFENesin-codeine 100-10 MG/5ML  syrup Commonly known as:  ROBITUSSIN AC Take 5 mLs by mouth every 6 (six) hours as needed for cough.   ipratropium-albuterol 0.5-2.5 (3) MG/3ML Soln Commonly known as:  DUONEB Take 3 mLs by nebulization 3 (three) times daily.   magnesium hydroxide 400 MG/5ML suspension Commonly known as:  MILK OF MAGNESIA Take 30 mLs by mouth daily as needed for mild constipation.   meloxicam 7.5 MG tablet Commonly known as:  MOBIC Take 7.5 mg by mouth 2 (two) times daily.   methocarbamol 500 MG tablet Commonly known as:  ROBAXIN Take 250 mg by mouth 3 (three) times daily. 1/2 tablet   metoCLOPramide 5 MG tablet Commonly known as:  REGLAN Take 5 mg by mouth 2 (two) times daily.   omeprazole 40 MG capsule Commonly known as:  PRILOSEC Take 40 mg by mouth daily.   oxycodone 5 MG capsule Commonly known as:  OXY-IR Take 5 mg by mouth every 4 (four) hours as needed. For moderate or severe pain   OXYGEN Inhale 2 L into the lungs continuous as needed.   polyethylene glycol packet Commonly known as:  MIRALAX Take 17 g by mouth 2 (two) times daily.   polyvinyl alcohol-povidone 1.4-0.6 % ophthalmic solution Commonly known as:  HYPOTEARS Place 1 drop into both eyes 4 (four) times daily as needed (dry eyes).   potassium chloride SA 20 MEQ tablet Commonly known as:  K-DUR,KLOR-CON Take 20 mEq by mouth daily.   predniSONE 10 MG tablet Commonly known as:  DELTASONE Take 10 mg by mouth daily with breakfast.   REFRESH LACRI-LUBE Oint Place 1/4 ribbon in both eyes at bedtime for dry eyes   REFRESH TEARS 0.5 % Soln Generic drug:  carboxymethylcellulose Place 2 drops into both eyes See admin instructions. 1 to 4 times daily prn   SENNA-PLUS 8.6-50 MG tablet Generic drug:  senna-docusate Take 2 tablets by mouth 2 (two) times daily as needed.   sertraline 100 MG tablet Commonly known as:  ZOLOFT Take 100 mg by mouth daily. 8 pm   sertraline 50 MG tablet Commonly known as:  ZOLOFT Take 50  mg by mouth at bedtime. 8 pm   sodium phosphate Pediatric 3.5-9.5 GM/59ML enema Place 1 enema rectally once as needed for severe constipation. Take once every 3 days if constipation is not relieved by Milk of magnesia or  Bisacodyl Suppository   spironolactone 50 MG tablet Commonly known as:  ALDACTONE Take 50 mg by mouth daily.       Review of Systems  Constitutional: Negative for activity change, appetite change, chills, diaphoresis, fatigue, fever and unexpected weight change (weight gain).  HENT: Negative for congestion, ear pain, mouth sores, nosebleeds, postnasal drip, rhinorrhea, sinus pain, sinus pressure, sneezing, sore throat, trouble swallowing and voice change.   Eyes: Negative.   Respiratory: Negative for apnea, cough, choking, chest tightness, shortness of breath and wheezing.   Cardiovascular: Negative for chest pain, palpitations and leg swelling.  Gastrointestinal: Negative for abdominal distention, abdominal pain, constipation, diarrhea and nausea.  Genitourinary: Negative for difficulty urinating, dysuria, frequency (chronic nocturia) and urgency.  Musculoskeletal: Positive for arthralgias (typical arthritis) and back pain (chronic). Negative for gait problem and myalgias.  Skin: Negative for color change, pallor, rash and wound.  Neurological: Negative for dizziness, tremors, syncope, speech difficulty, weakness, numbness and headaches.  Psychiatric/Behavioral: Negative for agitation and behavioral problems.  All other systems reviewed and are negative.   Immunization History  Administered Date(s) Administered  . Influenza Inj Mdck Quad Pf 02/14/2016  . Influenza-Unspecified 02/02/2014  . PPD Test 08/10/2014, 08/24/2014, 02/20/2016  . Pneumococcal-Unspecified 02/20/2016   Pertinent  Health Maintenance Due  Topic Date Due  . DEXA SCAN  07/01/1992  . INFLUENZA VACCINE  12/03/2016  . PNA vac Low Risk Adult (2 of 2 - PCV13) 02/19/2017   No flowsheet data  found. Functional Status Survey:    Vitals:   02/04/17 0843  BP: (!) 119/52  Pulse: 72  Resp: 20  Temp: 98 F (36.7 C)  SpO2: 95%  Weight: 132 lb 11.2 oz (60.2 kg)  Height: 5\' 2"  (1.575 m)   Body mass index is 24.27 kg/m. Physical Exam  Constitutional: She is oriented to person, place, and time. Vital signs are normal. She appears well-developed and well-nourished. She is active and cooperative. She does not appear ill. No distress.  HENT:  Head: Normocephalic and atraumatic.  Mouth/Throat: Uvula is midline, oropharynx is clear and moist and mucous membranes are normal. Mucous membranes are not pale, not dry and not cyanotic.  Eyes: Pupils are equal, round, and reactive to light. Conjunctivae, EOM and lids are normal.  Neck: Trachea normal, normal range of motion and full passive range of motion without pain. Neck supple. No JVD present. No tracheal deviation, no edema and no erythema present. No thyromegaly present.  Cardiovascular: Normal rate, regular rhythm, normal heart sounds and intact distal pulses.  Exam reveals no gallop, no distant heart sounds and no friction rub.   No murmur heard. Pulses:      Dorsalis pedis pulses are 1+ on the right side, and 1+ on the left side.  No edema. TED Hose in place  Pulmonary/Chest: Effort normal. No accessory muscle usage. No respiratory distress. She has no decreased breath sounds. She has no wheezes. She has no rhonchi. She has no rales. She exhibits no tenderness.  Abdominal: Soft. Normal appearance and bowel sounds are normal. She exhibits no distension and no ascites. There is no tenderness.  Musculoskeletal: Normal range of motion. She exhibits no edema or tenderness.  Expected osteoarthritis, stiffness  Neurological: She is alert and oriented to person, place, and time. She has normal strength. She exhibits abnormal muscle tone. Coordination and gait abnormal.  Skin: Skin is warm, dry and intact. No rash noted. She is not  diaphoretic. No cyanosis or erythema. No pallor. Nails show no  clubbing.  Psychiatric: She has a normal mood and affect. Her speech is normal and behavior is normal. Judgment and thought content normal. Cognition and memory are normal.  Nursing note and vitals reviewed.   Labs reviewed:  Recent Labs  07/21/16 1500 08/22/16 1036 09/04/16 1700 10/09/16 1500  NA 138  --  140 138  K 4.5 4.9 4.7 5.3*  CL 102  --  105 104  CO2 27  --  29 28  GLUCOSE 202*  --  106* 121*  BUN 18  --  25* 26*  CREATININE 1.28*  --  1.18* 1.43*  CALCIUM 8.4*  --  8.7* 9.0  MG  --   --  2.1  --     Recent Labs  07/21/16 1500 09/04/16 1700 10/09/16 1500  AST 19 18 19   ALT 14 15 12*  ALKPHOS 113 109 88  BILITOT 0.7 0.8 0.8  PROT 5.7* 5.7* 5.6*  ALBUMIN 3.7 3.8 3.7    Recent Labs  07/21/16 1500 09/04/16 1700 10/09/16 1500  WBC 11.1* 7.6 8.6  NEUTROABS 9.9* 6.3 7.7*  HGB 11.2* 9.3* 10.8*  HCT 34.1* 28.1* 32.4*  MCV 90.8 90.7 91.5  PLT 207 204 181   Lab Results  Component Value Date   TSH 2.985 09/04/2016   Lab Results  Component Value Date   HGBA1C 5.9 (H) 02/25/2016   Lab Results  Component Value Date   CHOL 127 02/09/2015   HDL 72 02/09/2015   LDLCALC 49 02/09/2015   TRIG 28 02/09/2015   CHOLHDL 1.8 02/09/2015    Significant Diagnostic Results in last 30 days:  No results found.  Assessment/Plan 1. Hemiplegia and hemiparesis following cerebral infarction affecting left non-dominant side (HCC)  Stable  Hand splints   Mobile with wheelchair  2. Hx of falling  Stable   3. Dysphagia following cerebral infarction  Stable   4. Dysphagia, oropharyngeal  Stable   5. Cognitive communication deficit  Stable   6. Hypertensive chronic kidney disease w stg 1-4/unsp chr kdny  Stable  Renally adjust meds as appropriate  7. Chronic kidney disease, stage III (moderate)  Stable  Renally adjust meds as appropriate  8. Vitamin B12 deficiency anemia due to  intrinsic factor deficiency  Stable  Continue Cyanocobalamin 500 mcg po Q Day  9. Personal history of nicotine dependence  Resolved   10. Anxiety disorder, unspecified type  Stable  Continue Clonazepam 0.25 mg po Q Day  11. Insomnia, unspecified type  Stable  Monitoring for needs   12. 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 Dry eye syndrome of both lacrimal glands  Stable  Artificial tears- ointment in the Left eye Q HS  Continue Hypotears- 1 drop in each eye QID  14. Allergic rhinitis, unspecified seasonality, unspecified trigger  Stable  Continue Fluticasone 50 mcg - 1 spray per nostril Q Day  15. Bell's palsy  Stable  Artificial tears- ointment in the Left eye Q HS  16. Nonrheumatic aortic valve insufficiency  Stable   17. Aneurysm, ascending aorta (HCC)  Stable   18. Benign essential HTN  Stable  Continue Furosemide 40 mg po Q Day  Continue K[place]-Con 20 meq po BID  Spirolalactone 50 mg po Q Day  19. Paroxysmal atrial fibrillation (HCC)  Stable  Continue Cardizem 60 mg po Q Day  Continue Eliquis 2.5 mg po BID  20. Chronic obstructive pulmonary disease, unspecified COPD  type (HCC)  Stable  Continue Tessalon Perles 100 mg po Q 8 hrs PRN  Continue Breo-Ellipta 100-25 mcg- 1 inhalation Q Day  Continue Combivent 20-200 QID  Proventil 90 mcg- 2 puffs Q 4 hours prn  Proventil 90 mcg- 2 puffs TID x 7 days  21. Age related osteoporosis, unspecified pathological fracture presence  Not- stable  Recent multi-level Kyphoplasty for compression fractures  Cholecalciferol 4,000 units po Q Day  22. Other chronic pain  Stable  Continue Tylenol 650 mg po QID  Continue Oxycodone 5 mg po Q 4 hours prn  Continue Methocarbamol 500 mg- 1/2 tablet po TID for spasms  Continue meloxicam 7.5 mg po Q 12 hours- severe pain  Voltaren gel 1%- apply 4 grams to the  lumbar spine QID prn  Fentanyl 25 mcg patch- Q 3 days for pain # 5  23. Hyperlipidemia, unspecified hyperlipidemia type  Stable  Continue Atorvastatin 20 mg po Q HS  24. Addison anemia  Stable  Continue Ferrous Sulfate 325 mg po BID  25. Gastroesophageal Reflux disease without esophagitis  Stable  Continue Omeprazole 40 mg po Q Day  26. Chronic chest wall pain  Continue Aspercreme (Lidocaine) patch 4%- Apply to right ribs daily, remove patch after 12 hours  Increase Gabapentin 200 mg po TID  27. Decreased motility of the stomach  Metoclopramide 5 mg po BID for recurrent ileus  Continue all medications as listed above unless otherwise noted  Family/ staff Communication:   Total Time:  Documentation:  Face to Face:  Family/Phone:   Labs/tests ordered: Not due  Medication list reviewed and assessed for continued appropriateness. Monthly medication orders reviewed and signed.  Vikki Ports, NP-C Geriatrics [facility] [phone], [place] [address] Cell Phone (Mon-Fri 8am-5pm):  [phone] On Call:  [phone] & follow prompts after 5pm & weekends Office Phone:  [phone] Office Fax:  [phone]

## 2017-02-09 ENCOUNTER — Other Ambulatory Visit: Payer: Self-pay

## 2017-02-09 MED ORDER — FENTANYL 25 MCG/HR TD PT72: 25.0000 ug | MEDICATED_PATCH | TRANSDERMAL | 0 refills | Status: DC

## 2017-02-09 NOTE — Telephone Encounter (Signed)
Rx sent to [facility] phone : [phone] , fax : [phone]  

## 2017-02-11 DIAGNOSIS — S52331D Displaced oblique fracture of shaft of right radius, subsequent encounter for closed fracture with routine healing: Secondary | ICD-10-CM | POA: Diagnosis not present

## 2017-02-15 DIAGNOSIS — M79662 Pain in left lower leg: Secondary | ICD-10-CM | POA: Diagnosis not present

## 2017-02-15 DIAGNOSIS — M25562 Pain in left knee: Secondary | ICD-10-CM | POA: Diagnosis not present

## 2017-03-04 ENCOUNTER — Other Ambulatory Visit: Payer: Self-pay

## 2017-03-04 MED ORDER — FENTANYL 25 MCG/HR TD PT72: 25.0000 ug | MEDICATED_PATCH | TRANSDERMAL | 0 refills | Status: DC

## 2017-03-04 NOTE — Telephone Encounter (Signed)
Rx sent to [facility] phone : [phone] , fax : [phone]  

## 2017-03-05 ENCOUNTER — Non-Acute Institutional Stay (SKILLED_NURSING_FACILITY): Payer: Medicare Other | Admitting: Gerontology

## 2017-03-05 ENCOUNTER — Encounter
Admission: RE | Admit: 2017-03-05 | Discharge: 2017-03-05 | Disposition: A | Discharge status: Home / Self Care | Payer: Medicare Other | Source: Ambulatory Visit | Attending: Internal Medicine | Admitting: Internal Medicine

## 2017-03-05 DIAGNOSIS — I480 Paroxysmal atrial fibrillation: Secondary | ICD-10-CM

## 2017-03-05 DIAGNOSIS — G510 Bell's palsy: Secondary | ICD-10-CM

## 2017-03-05 DIAGNOSIS — K3184 Gastroparesis: Secondary | ICD-10-CM

## 2017-03-05 DIAGNOSIS — K219 Gastro-esophageal reflux disease without esophagitis: Secondary | ICD-10-CM

## 2017-03-05 DIAGNOSIS — I712 Thoracic aortic aneurysm, without rupture: Secondary | ICD-10-CM

## 2017-03-05 DIAGNOSIS — M810 Age-related osteoporosis without current pathological fracture: Secondary | ICD-10-CM

## 2017-03-05 DIAGNOSIS — R41841 Cognitive communication deficit: Secondary | ICD-10-CM | POA: Diagnosis not present

## 2017-03-05 DIAGNOSIS — H04123 Dry eye syndrome of bilateral lacrimal glands: Secondary | ICD-10-CM

## 2017-03-05 DIAGNOSIS — I69391 Dysphagia following cerebral infarction: Secondary | ICD-10-CM

## 2017-03-05 DIAGNOSIS — G4700 Insomnia, unspecified: Secondary | ICD-10-CM | POA: Diagnosis not present

## 2017-03-05 DIAGNOSIS — I129 Hypertensive chronic kidney disease with stage 1 through stage 4 chronic kidney disease, or unspecified chronic kidney disease: Secondary | ICD-10-CM | POA: Diagnosis not present

## 2017-03-05 DIAGNOSIS — K30 Functional dyspepsia: Secondary | ICD-10-CM

## 2017-03-05 DIAGNOSIS — F419 Anxiety disorder, unspecified: Secondary | ICD-10-CM

## 2017-03-05 DIAGNOSIS — Z9181 History of falling: Secondary | ICD-10-CM | POA: Diagnosis not present

## 2017-03-05 DIAGNOSIS — D510 Vitamin B12 deficiency anemia due to intrinsic factor deficiency: Secondary | ICD-10-CM

## 2017-03-05 DIAGNOSIS — R1312 Dysphagia, oropharyngeal phase: Secondary | ICD-10-CM | POA: Diagnosis not present

## 2017-03-05 DIAGNOSIS — Z87891 Personal history of nicotine dependence: Secondary | ICD-10-CM | POA: Diagnosis not present

## 2017-03-05 DIAGNOSIS — N183 Chronic kidney disease, stage 3 unspecified: Secondary | ICD-10-CM

## 2017-03-05 DIAGNOSIS — F325 Major depressive disorder, single episode, in full remission: Secondary | ICD-10-CM

## 2017-03-05 DIAGNOSIS — I69354 Hemiplegia and hemiparesis following cerebral infarction affecting left non-dominant side: Secondary | ICD-10-CM | POA: Diagnosis not present

## 2017-03-05 DIAGNOSIS — J309 Allergic rhinitis, unspecified: Secondary | ICD-10-CM

## 2017-03-05 DIAGNOSIS — G8929 Other chronic pain: Secondary | ICD-10-CM

## 2017-03-05 DIAGNOSIS — I10 Essential (primary) hypertension: Secondary | ICD-10-CM

## 2017-03-05 DIAGNOSIS — I351 Nonrheumatic aortic (valve) insufficiency: Secondary | ICD-10-CM

## 2017-03-05 DIAGNOSIS — E785 Hyperlipidemia, unspecified: Secondary | ICD-10-CM

## 2017-03-05 DIAGNOSIS — R0789 Other chest pain: Secondary | ICD-10-CM

## 2017-03-05 DIAGNOSIS — I7121 Aneurysm of the ascending aorta, without rupture: Secondary | ICD-10-CM

## 2017-03-05 DIAGNOSIS — J449 Chronic obstructive pulmonary disease, unspecified: Secondary | ICD-10-CM

## 2017-03-06 ENCOUNTER — Encounter: Payer: Self-pay | Admitting: Gerontology

## 2017-03-11 DIAGNOSIS — S52331D Displaced oblique fracture of shaft of right radius, subsequent encounter for closed fracture with routine healing: Secondary | ICD-10-CM | POA: Diagnosis not present

## 2017-03-18 NOTE — Progress Notes (Signed)
Location:   [facility] Room Number: [place] of Service:  SNF [phone]) Provider:  Toni Arthurs, NP-C  Juluis Pitch, MD  Patient Care Team: Juluis Pitch, MD as PCP - General [facility] Medicine)  Extended Emergency Contact Information Primary Emergency Contact: [name] Address: [address]          [place], [place] [address] [name] of [place] Phone: [phone] Relation: Daughter Secondary Emergency Contact: Lingerfelt,Brad Address: [place], [place] [address] [name] of [place] Phone: [phone] Relation: [name]  Code Status:  Full Goals of care: Advanced Directive information Advanced Directives 03/06/2017  Does Patient Have a Medical Advance Directive? Yes  Type of Advance Directive Out of facility DNR (pink MOST or yellow form)  Does patient want to make changes to medical advance directive? No - Patient declined  Would patient like information on creating a medical advance directive? -     Chief Complaint  Patient presents with  . Medical Management of Chronic Issues    Routine Visit    HPI:  Pt is a 83 y.o. female seen today for medical management of chronic diseases.  Patient has multiple medical issues, mainly related to pain.  Patient is on chronic p.o. steroids and inhalers for COPD and Bell's palsy.  As a result patient has had multiple wedge compression fractures of the lumbar vertebral spine.  Patient reports her appetite is good, having regular BMs, voiding without difficulty.  She is mobile in a wheelchair.  She does socialize with other residents consistently.  She seems to enjoy working the puzzles.  She has not had any falls this past month.  No complaints of pain beyond the normal at this time.  She has intermittent flares of COPD that tend to not resolve quickly.  Aside from the chronic pain-which has improved, patient has no complaints at this time.  Vital signs  stable  Past Medical History:  Diagnosis Date  . Adenoma of rectum   . Adrenal adenoma   . Anemia   . Anemia, unspecified   . Arthritis   . Ascending aortic aneurysm ([place])    a. s/p repair 07/2014  . Atrophic vaginitis   . Benign neoplasm of colon, unspecified   . COPD (chronic obstructive pulmonary disease) ([place])   . Coronary artery disease, non-occlusive    a. LHC 06/2014 without evidence of obstructive disease  . Depression   . Diverticulosis   . Dysphagia   . Dysrhythmia   . Essential hypertension    benign  . Frequent UTI   . GERD (gastroesophageal reflux disease)   . Hemiplegia and hemiparesis ([place])    following cerebral infarction affecting left non-dominant side  . Hypertension   . Microscopic hematuria   . Nonrheumatic aortic valve insufficiency   . Osteoporosis    unspecified  . Panic attacks   . Paroxysmal atrial fibrillation (HCC)   . Pernicious anemia   . Renal cyst    CKD STAGE 3  . Stroke ([place]) 07/23/2014   a. post-operative setting in 07/2014 leading to left UE hemiapresis and left LE weakness  . Urge incontinence    Past Surgical History:  Procedure Laterality Date  . ABDOMINAL HYSTERECTOMY  1972  . AORTOILIAC BYPASS    . ASCENDING AORTIC ANEURYSM REPAIR  07/23/2014   07/2014  . BLEPHAROPLASTY    . CATARACT EXTRACTION  2005  . GASTROSTOMY W/ FEEDING  TUBE    . LEFT HEART CATH  06/2014  . PR ASCEND AORTIC GRAFT INCL VALVE SUSPENSION  07/07/2014   Procedure: ASCENDING AORTA GRAFT, WITH CARDIOPULMONARY BYPASS, WITH OR WITHOUT VALVE SUSPENSION; Surgeon: Dwaine Deter, MD; Location: MAIN OR [facility]; Service: Cardiothoracic  . PR LAP, GASTROTOMY W/O TUBE CONSTR  08/08/2014   Procedure: LAPAROSCOPY, SURGICAL; GASTOSTOMY W/O CONSTRUCTION OF GASTRIC TUBE (EG, STAMM PROCEDURE)(SEPARATE PROCED); Surgeon: Fredrik Rigger, MD; Location: MAIN OR [facility]; Service: Gastrointestinal  . PR PLACE PERCUT GASTROSTOMY TUBE  08/08/2014   Procedure: UGI ENDO;  W/DIRECTED PLCMT PERQ GASTROSTOMY TUBE; Surgeon: Fredrik Rigger, MD; Location: MAIN OR [facility]; Service: Gastrointestinal    Allergies  Allergen Reactions  . Iodinated Diagnostic Agents Itching and Swelling  . Nitrofurantoin Diarrhea    Other reaction(s): Unknown  . Omnipaque [Iohexol]   . Solifenacin Other (See Comments)    indigestion  . Ciprofloxacin Other (See Comments) and Rash    Colitis  . Metronidazole Itching and Rash  . Sulfa Antibiotics Itching and Rash    Allergies as of 03/05/2017      Reactions   Iodinated Diagnostic Agents Itching, Swelling   Nitrofurantoin Diarrhea   Other reaction(s): Unknown   Omnipaque [iohexol]    Solifenacin Other (See Comments)   indigestion   Ciprofloxacin Other (See Comments), Rash   Colitis   Metronidazole Itching, Rash   Sulfa Antibiotics Itching, Rash      Medication List        Accurate as of 03/05/17 11:59 PM. Always use your most recent med list.          acetaminophen 325 MG tablet Commonly known as:  TYLENOL Take 650 mg by mouth 4 (four) times daily.   albuterol 108 (90 Base) MCG/ACT inhaler Commonly known as:  PROVENTIL HFA;VENTOLIN HFA Inhale 2 puffs into the lungs every 4 (four) hours as needed for wheezing or shortness of breath.   alum & mag hydroxide-simeth 675-916-38 MG/5ML suspension Commonly known as:  MAALOX PLUS Take 30 mLs by mouth every 4 (four) hours as needed.   ASPERCREME LIDOCAINE 4 % Ptch Generic drug:  Lidocaine Apply 1 patch topically daily. Apply to right flank/sore ribs daily. Remove after 12 hours.   Lidocaine 4 % Ptch Apply 1 patch topically daily. Apply to right proximal forearm. Remove after 12 hours   atorvastatin 20 MG tablet Commonly known as:  LIPITOR Take 20 mg by mouth at bedtime.   benzonatate 100 MG capsule Commonly known as:  TESSALON Take 100 mg by mouth every 8 (eight) hours as needed for cough.   BREO ELLIPTA 100-25 MCG/INH Aepb Generic drug:  fluticasone  furoate-vilanterol Inhale 1 puff into the lungs daily.   Cholecalciferol 4000 units Caps Take 1 capsule by mouth daily.   clonazePAM 0.25 MG disintegrating tablet Commonly known as:  KLONOPIN Take 0.5 tablets (0.125 mg total) by mouth daily. Note Dose   cyanocobalamin 500 MCG tablet Take 500 mcg by mouth daily.   dextromethorphan-guaiFENesin 30-600 MG 12hr tablet Commonly known as:  MUCINEX DM Take 1 tablet by mouth 2 (two) times daily.   diclofenac sodium 1 % Gel Commonly known as:  VOLTAREN Apply 4 g topically 4 (four) times daily as needed (pain). To lumbar spine   diltiazem 60 MG tablet Commonly known as:  CARDIZEM Take 60 mg by mouth daily.   docusate sodium 100 MG capsule Commonly known as:  COLACE Take 1 capsule (100 mg total) by mouth 2 (two) times daily.  ELIQUIS 2.5 MG Tabs tablet Generic drug:  apixaban Take 2.5 mg by mouth 2 (two) times daily.   fentaNYL 25 MCG/HR patch Commonly known as:  DURAGESIC - dosed mcg/hr Place 1 patch (25 mcg total) onto the skin every 3 (three) days.   ferrous sulfate 325 (65 FE) MG tablet Take 1 tablet (325 mg total) by mouth 2 (two) times daily with a meal.   fluticasone 50 MCG/ACT nasal spray Commonly known as:  FLONASE Place 1 spray into both nostrils daily.   furosemide 40 MG tablet Commonly known as:  LASIX Take 40 mg by mouth daily.   gabapentin 100 MG capsule Commonly known as:  NEURONTIN Take 200 mg by mouth 3 (three) times daily. 2 caps   guaiFENesin-codeine 100-10 MG/5ML syrup Commonly known as:  ROBITUSSIN AC Take 5 mLs by mouth every 6 (six) hours as needed for cough.   ipratropium-albuterol 0.5-2.5 (3) MG/3ML Soln Commonly known as:  DUONEB Take 3 mLs by nebulization 3 (three) times daily.   magnesium hydroxide 400 MG/5ML suspension Commonly known as:  MILK OF MAGNESIA Take 30 mLs by mouth daily as needed for mild constipation.   meloxicam 7.5 MG tablet Commonly known as:  MOBIC Take 7.5 mg by  mouth 2 (two) times daily.   methocarbamol 500 MG tablet Commonly known as:  ROBAXIN Take 250 mg by mouth 3 (three) times daily. 1/2 tablet   metoCLOPramide 5 MG tablet Commonly known as:  REGLAN Take 5 mg by mouth 2 (two) times daily.   omeprazole 40 MG capsule Commonly known as:  PRILOSEC Take 40 mg by mouth daily.   oxycodone 5 MG capsule Commonly known as:  OXY-IR Take 5 mg by mouth every 4 (four) hours as needed. For moderate or severe pain   OXYGEN Inhale 2 L into the lungs continuous as needed.   polyethylene glycol packet Commonly known as:  MIRALAX Take 17 g by mouth 2 (two) times daily.   polyvinyl alcohol-povidone 1.4-0.6 % ophthalmic solution Commonly known as:  HYPOTEARS Place 1 drop into both eyes 4 (four) times daily as needed (dry eyes).   potassium chloride SA 20 MEQ tablet Commonly known as:  K-DUR,KLOR-CON Take 20 mEq by mouth daily.   predniSONE 10 MG tablet Commonly known as:  DELTASONE Take 10 mg by mouth daily with breakfast.   REFRESH LACRI-LUBE Oint Place 1/4 ribbon in both eyes at bedtime for dry eyes   REFRESH TEARS 0.5 % Soln Generic drug:  carboxymethylcellulose Place 2 drops into both eyes See admin instructions. 1 to 4 times daily prn   SENNA-PLUS 8.6-50 MG tablet Generic drug:  senna-docusate Take 2 tablets by mouth 2 (two) times daily as needed.   sertraline 100 MG tablet Commonly known as:  ZOLOFT Take 100 mg by mouth daily. 8 pm   sertraline 50 MG tablet Commonly known as:  ZOLOFT Take 50 mg by mouth at bedtime. 8 pm   sodium phosphate Pediatric 3.5-9.5 GM/59ML enema Place 1 enema rectally once as needed for severe constipation. Take once every 3 days if constipation is not relieved by Milk of magnesia or Bisacodyl Suppository   spironolactone 50 MG tablet Commonly known as:  ALDACTONE Take 50 mg by mouth daily.       Review of Systems  Constitutional: Negative for activity change, appetite change, chills,  diaphoresis, fatigue, fever and unexpected weight change (weight gain).  HENT: Negative for congestion, ear pain, mouth sores, nosebleeds, postnasal drip, rhinorrhea, sinus pressure, sinus pain,  sneezing, sore throat, trouble swallowing and voice change.   Eyes: Negative.   Respiratory: Negative for apnea, cough, choking, chest tightness, shortness of breath and wheezing.   Cardiovascular: Negative for chest pain, palpitations and leg swelling.  Gastrointestinal: Negative for abdominal distention, abdominal pain, constipation, diarrhea and nausea.  Genitourinary: Negative for difficulty urinating, dysuria, frequency (chronic nocturia) and urgency.  Musculoskeletal: Positive for arthralgias (typical arthritis) and back pain (chronic). Negative for gait problem and myalgias.  Skin: Negative for color change, pallor, rash and wound.  Neurological: Negative for dizziness, tremors, syncope, speech difficulty, weakness, numbness and headaches.  Psychiatric/Behavioral: Negative for agitation and behavioral problems.  All other systems reviewed and are negative.   Immunization History  Administered Date(s) Administered  . Influenza Inj Mdck Quad Pf 02/14/2016  . Influenza-Unspecified 02/02/2014  . PPD Test 08/10/2014, 08/24/2014, 02/20/2016  . Pneumococcal-Unspecified 02/20/2016   Pertinent  Health Maintenance Due  Topic Date Due  . DEXA SCAN  07/01/1992  . INFLUENZA VACCINE  12/03/2016  . PNA vac Low Risk Adult (2 of 2 - PCV13) 02/19/2017   No flowsheet data found. Functional Status Survey:    Vitals:   03/05/17 1611  BP: 133/80  Pulse: 76  Resp: 19  Temp: 98.8 F (37.1 C)  SpO2: 96%  Weight: 126 lb 9.6 oz (57.4 kg)  Height: 5\' 2"  (1.575 m)   Body mass index is 23.16 kg/m. Physical Exam  Constitutional: She is oriented to person, place, and time. Vital signs are normal. She appears well-developed and well-nourished. She is active and cooperative. She does not appear ill. No  distress.  HENT:  Head: Normocephalic and atraumatic.  Mouth/Throat: Uvula is midline, oropharynx is clear and moist and mucous membranes are normal. Mucous membranes are not pale, not dry and not cyanotic.  Eyes: Conjunctivae, EOM and lids are normal. Pupils are equal, round, and reactive to light.  Neck: Trachea normal, normal range of motion and full passive range of motion without pain. Neck supple. No JVD present. No tracheal deviation, no edema and no erythema present. No thyromegaly present.  Cardiovascular: Normal rate, regular rhythm, normal heart sounds and intact distal pulses. Exam reveals no gallop, no distant heart sounds and no friction rub.  No murmur heard. Pulses:      Dorsalis pedis pulses are 1+ on the right side, and 1+ on the left side.  No edema.  TED hose in place  Pulmonary/Chest: Effort normal. No accessory muscle usage. No respiratory distress. She has no decreased breath sounds. She has no wheezes. She has no rhonchi. She has no rales. She exhibits no tenderness.  Abdominal: Soft. Normal appearance and bowel sounds are normal. She exhibits no distension and no ascites. There is no tenderness.  Musculoskeletal: She exhibits no edema.       Right forearm: She exhibits tenderness and bony tenderness.  Expected osteoarthritis, stiffness.  Calves soft, supple.  Negative Homans sign.  Bilateral radial pulses equal and strong.  No edema  Neurological: She is alert and oriented to person, place, and time. She has normal strength. She exhibits abnormal muscle tone. Coordination and gait abnormal.  Skin: Skin is warm, dry and intact. No rash noted. She is not diaphoretic. No cyanosis or erythema. No pallor. Nails show no clubbing.  Psychiatric: She has a normal mood and affect. Her speech is normal and behavior is normal. Judgment and thought content normal. Cognition and memory are normal.  Nursing note and vitals reviewed.   Labs reviewed: Recent Labs  07/21/16 1500  08/22/16 1036 09/04/16 1700 10/09/16 1500  NA 138  --  140 138  K 4.5 4.9 4.7 5.3*  CL 102  --  105 104  CO2 27  --  29 28  GLUCOSE 202*  --  106* 121*  BUN 18  --  25* 26*  CREATININE 1.28*  --  1.18* 1.43*  CALCIUM 8.4*  --  8.7* 9.0  MG  --   --  2.1  --    Recent Labs    07/21/16 1500 09/04/16 1700 10/09/16 1500  AST 19 18 19   ALT 14 15 12*  ALKPHOS 113 109 88  BILITOT 0.7 0.8 0.8  PROT 5.7* 5.7* 5.6*  ALBUMIN 3.7 3.8 3.7   Recent Labs    07/21/16 1500 09/04/16 1700 10/09/16 1500  WBC 11.1* 7.6 8.6  NEUTROABS 9.9* 6.3 7.7*  HGB 11.2* 9.3* 10.8*  HCT 34.1* 28.1* 32.4*  MCV 90.8 90.7 91.5  PLT 207 204 181   Lab Results  Component Value Date   TSH 2.985 09/04/2016   Lab Results  Component Value Date   HGBA1C 5.9 (H) 02/25/2016   Lab Results  Component Value Date   CHOL 127 02/09/2015   HDL 72 02/09/2015   LDLCALC 49 02/09/2015   TRIG 28 02/09/2015   CHOLHDL 1.8 02/09/2015    Significant Diagnostic Results in last 30 days:  No results found.  Assessment/Plan 1. Hemiplegia and hemiparesis following cerebral infarction affecting left non-dominant side (HCC)  Stable  Hand splints   Mobile with wheelchair  2. Hx of falling  Stable   3. Dysphagia following cerebral infarction  Stable   4. Dysphagia, oropharyngeal  Stable   5. Cognitive communication deficit  Stable   6. Hypertensive chronic kidney disease w stg 1-4/unsp chr kdny  Stable  Renally adjust meds as appropriate  7. Chronic kidney disease, stage III (moderate)  Stable  Renally adjust meds as appropriate  8. Vitamin B12 deficiency anemia due to intrinsic factor deficiency  Stable  Continue Cyanocobalamin 500 mcg po Q Day  9. Personal history of nicotine dependence  Resolved   10. Anxiety disorder, unspecified type  Stable  Decrease Clonazepam 0.125 mg po Q Day- attempted GDR  11. Insomnia, unspecified type  Stable  Monitoring for needs   12. 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 Dry eye syndrome of both lacrimal glands  Stable  Continue Artificial tears- ointment in the Left eye Q HS  Continue Hypotears- 1 drop in each eye QID  14. Allergic rhinitis, unspecified seasonality, unspecified trigger  Stable  Continue Fluticasone 50 mcg - 1 spray per nostril Q Day  15. Bell's palsy  Stable  Continue Artificial tears- ointment in the Left eye Q HS  16. Nonrheumatic aortic valve insufficiency  Stable   17. Aneurysm, ascending aorta (HCC)  Stable   18. Benign essential HTN  Stable  Continue Furosemide 40 mg po Q Day  Continue Klor-Con 20 meq po BID  Continue Spironolactone 50 mg po Q Day  19. Paroxysmal atrial fibrillation (HCC)  Stable  Continue Cardizem 60 mg po Q Day  Continue Eliquis 2.5 mg po BID  20. Chronic obstructive pulmonary disease, unspecified COPD type (HCC)  Stable  Continue Tessalon Perles 100 mg po Q 8 hrs PRN  Continue Breo-Ellipta 100-25 mcg- 1 inhalation Q Day  Continue Combivent 20-200 QID  Continue Proventil 90 mcg-  2 puffs Q 4 hours prn  21. Age related osteoporosis, unspecified pathological fracture presence  Stable  Continue Cholecalciferol 4,000 units po Q Day  22. Other chronic pain  Stable  Continue Tylenol 650 mg po QID  Continue Oxycodone 5 mg po Q 4 hours prn  Continue Methocarbamol 500 mg- 1/2 tablet po TID for spasms  Continue meloxicam 7.5 mg po Q 12 hours- severe pain  Continue Voltaren gel 1%- apply 4 grams to the lumbar spine QID prn  Continue Fentanyl 25 mcg patch- Q 3 days for pain # 5  23. Hyperlipidemia, unspecified hyperlipidemia type  Stable  Continue Atorvastatin 20 mg po Q HS  24. Addison anemia  Stable  Continue Ferrous Sulfate 325 mg po BID  25. Gastroesophageal Reflux disease without esophagitis  Stable  Continue Omeprazole 40 mg po Q  Day  26. Chronic chest wall pain  Continue Aspercreme (Lidocaine) patch 4%- Apply to right ribs daily, remove patch after 12 hours  Continue Gabapentin 200 mg po TID  27. Decreased motility of the stomach  Stable   Decrease Metoclopramide 2.5 mg po BID for recurrent ileus  Continue all medications as listed above   Family/ staff Communication:   Total Time:  Documentation:  Face to Face:  Family/Phone:   Labs/tests ordered: Not due  Medication list reviewed and assessed for continued appropriateness. Monthly medication orders reviewed and signed.  Vikki Ports, NP-C [facility] [phone], [place] [address] Cell Phone (Mon-Fri 8am-5pm):  [phone] On Call:  [phone] & follow prompts after 5pm & weekends Office Phone:  [phone] Office Fax:  [phone]

## 2017-03-25 ENCOUNTER — Other Ambulatory Visit: Payer: Self-pay

## 2017-03-25 MED ORDER — CLONAZEPAM 0.25 MG PO TBDP
0.1250 mg | ORAL_TABLET | Freq: Every day | ORAL | 0 refills | Status: DC
Start: 2017-03-25 — End: 2017-05-14

## 2017-03-25 NOTE — Telephone Encounter (Signed)
Rx sent to [facility] phone : [phone] , fax : [phone]  

## 2017-04-04 ENCOUNTER — Encounter
Admission: RE | Admit: 2017-04-04 | Discharge: 2017-04-04 | Disposition: A | Discharge status: Home / Self Care | Payer: Medicare Other | Source: Ambulatory Visit | Attending: Internal Medicine | Admitting: Internal Medicine

## 2017-04-06 ENCOUNTER — Encounter: Payer: Self-pay | Admitting: Gerontology

## 2017-04-08 ENCOUNTER — Other Ambulatory Visit: Payer: Self-pay

## 2017-04-08 DIAGNOSIS — S52331D Displaced oblique fracture of shaft of right radius, subsequent encounter for closed fracture with routine healing: Secondary | ICD-10-CM | POA: Diagnosis not present

## 2017-04-08 MED ORDER — FENTANYL 25 MCG/HR TD PT72: 25.0000 ug | MEDICATED_PATCH | TRANSDERMAL | 0 refills | Status: DC

## 2017-04-08 NOTE — Telephone Encounter (Signed)
Rx sent to [facility] phone : [phone] , fax : [phone]  

## 2017-04-16 DIAGNOSIS — M62422 Contracture of muscle, left upper arm: Secondary | ICD-10-CM | POA: Diagnosis not present

## 2017-04-16 DIAGNOSIS — I69354 Hemiplegia and hemiparesis following cerebral infarction affecting left non-dominant side: Secondary | ICD-10-CM | POA: Diagnosis not present

## 2017-04-16 DIAGNOSIS — R279 Unspecified lack of coordination: Secondary | ICD-10-CM | POA: Diagnosis not present

## 2017-04-16 DIAGNOSIS — M80031D Age-related osteoporosis with current pathological fracture, right forearm, subsequent encounter for fracture with routine healing: Secondary | ICD-10-CM | POA: Diagnosis not present

## 2017-04-16 DIAGNOSIS — Z9181 History of falling: Secondary | ICD-10-CM | POA: Diagnosis not present

## 2017-04-16 DIAGNOSIS — M6281 Muscle weakness (generalized): Secondary | ICD-10-CM | POA: Diagnosis not present

## 2017-04-22 ENCOUNTER — Other Ambulatory Visit: Payer: Self-pay

## 2017-04-22 MED ORDER — FENTANYL 25 MCG/HR TD PT72: 25.0000 ug | MEDICATED_PATCH | TRANSDERMAL | 0 refills | Status: DC

## 2017-04-22 NOTE — Telephone Encounter (Signed)
Rx sent to [facility] phone : [phone] , fax : [phone]  

## 2017-05-05 ENCOUNTER — Encounter
Admission: RE | Admit: 2017-05-05 | Discharge: 2017-05-05 | Disposition: A | Discharge status: Home / Self Care | Payer: Medicare Other | Source: Ambulatory Visit | Attending: Internal Medicine | Admitting: Internal Medicine

## 2017-05-06 DIAGNOSIS — S52331D Displaced oblique fracture of shaft of right radius, subsequent encounter for closed fracture with routine healing: Secondary | ICD-10-CM | POA: Diagnosis not present

## 2017-05-14 ENCOUNTER — Encounter: Payer: Self-pay | Admitting: Gerontology

## 2017-05-14 NOTE — Progress Notes (Signed)
This encounter was created in error - please disregard.

## 2017-05-20 DIAGNOSIS — M6281 Muscle weakness (generalized): Secondary | ICD-10-CM | POA: Diagnosis not present

## 2017-05-20 DIAGNOSIS — M62422 Contracture of muscle, left upper arm: Secondary | ICD-10-CM | POA: Diagnosis not present

## 2017-05-20 DIAGNOSIS — I69354 Hemiplegia and hemiparesis following cerebral infarction affecting left non-dominant side: Secondary | ICD-10-CM | POA: Diagnosis not present

## 2017-05-21 DIAGNOSIS — M62422 Contracture of muscle, left upper arm: Secondary | ICD-10-CM | POA: Diagnosis not present

## 2017-05-21 DIAGNOSIS — M6281 Muscle weakness (generalized): Secondary | ICD-10-CM | POA: Diagnosis not present

## 2017-05-21 DIAGNOSIS — I69354 Hemiplegia and hemiparesis following cerebral infarction affecting left non-dominant side: Secondary | ICD-10-CM | POA: Diagnosis not present

## 2017-05-22 NOTE — Progress Notes (Signed)
This encounter was created in error - please disregard.

## 2017-05-25 DIAGNOSIS — I69354 Hemiplegia and hemiparesis following cerebral infarction affecting left non-dominant side: Secondary | ICD-10-CM | POA: Diagnosis not present

## 2017-05-25 DIAGNOSIS — M62422 Contracture of muscle, left upper arm: Secondary | ICD-10-CM | POA: Diagnosis not present

## 2017-05-25 DIAGNOSIS — M6281 Muscle weakness (generalized): Secondary | ICD-10-CM | POA: Diagnosis not present

## 2017-05-27 ENCOUNTER — Non-Acute Institutional Stay (SKILLED_NURSING_FACILITY): Payer: Medicare Other | Admitting: Gerontology

## 2017-05-27 ENCOUNTER — Encounter: Payer: Self-pay | Admitting: Gerontology

## 2017-05-27 DIAGNOSIS — I10 Essential (primary) hypertension: Secondary | ICD-10-CM

## 2017-05-27 DIAGNOSIS — I480 Paroxysmal atrial fibrillation: Secondary | ICD-10-CM | POA: Diagnosis not present

## 2017-05-27 DIAGNOSIS — I351 Nonrheumatic aortic (valve) insufficiency: Secondary | ICD-10-CM

## 2017-05-27 DIAGNOSIS — K219 Gastro-esophageal reflux disease without esophagitis: Secondary | ICD-10-CM | POA: Diagnosis not present

## 2017-05-28 DIAGNOSIS — I482 Chronic atrial fibrillation: Secondary | ICD-10-CM | POA: Diagnosis not present

## 2017-05-28 DIAGNOSIS — M62422 Contracture of muscle, left upper arm: Secondary | ICD-10-CM | POA: Diagnosis not present

## 2017-05-28 DIAGNOSIS — M6281 Muscle weakness (generalized): Secondary | ICD-10-CM | POA: Diagnosis not present

## 2017-05-28 DIAGNOSIS — D638 Anemia in other chronic diseases classified elsewhere: Secondary | ICD-10-CM | POA: Diagnosis not present

## 2017-05-28 DIAGNOSIS — I69354 Hemiplegia and hemiparesis following cerebral infarction affecting left non-dominant side: Secondary | ICD-10-CM | POA: Diagnosis not present

## 2017-05-28 NOTE — Assessment & Plan Note (Signed)
Stable. F/U with cardiology as needed. Pt denies chest pain or shortness of breath

## 2017-05-28 NOTE — Assessment & Plan Note (Signed)
Pt c/o intermittent nausea. No vomiting. Reports nausea improves when she eats. Denies abdominal pain, burning. On Omeprazole 40 mg po Q Day for GERD already.

## 2017-05-28 NOTE — Assessment & Plan Note (Signed)
Stable. No recent exacerbations. Rate controlled on Cardizem 60 mg daily

## 2017-05-28 NOTE — Progress Notes (Signed)
Location:    Nursing Home Room Number: 319A Place of Service:  SNF (31) Provider:  Toni Arthurs, NP-C  Juluis Pitch, MD  Patient Care Team: Juluis Pitch, MD as PCP - General [facility] Medicine)  Extended Emergency Contact Information Primary Emergency Contact: [name] Address: [address]          [place], [place] [address] [name] of [place] Phone: [phone] Relation: Daughter Secondary Emergency Contact: Lingerfelt,Brad Address: [address] [place],  [address] [name] of [place] Phone: [phone] Relation: [name]  Code Status:  DNR Goals of care: Advanced Directive information Advanced Directives 05/27/2017  Does Patient Have a Medical Advance Directive? Yes  Type of Advance Directive Out of facility DNR (pink MOST or yellow form)  Does patient want to make changes to medical advance directive? No - Patient declined  Would patient like information on creating a medical advance directive? -  Pre-existing out of facility DNR order (yellow form or pink MOST form) Yellow form placed in chart (order not valid for inpatient use)     Chief Complaint  Patient presents with  . Medical Management of Chronic Issues    Routine Visit    HPI:  Pt is a 84 y.o. female seen today for medical management of chronic diseases.    Paroxysmal atrial fibrillation (HCC) Stable. No recent exacerbations. Rate controlled on Cardizem 60 mg daily  Nonrheumatic aortic valve insufficiency Stable. F/U with cardiology as needed. Pt denies chest pain or shortness of breath  Benign essential HTN Stable. B/Ps controlled with Cardizem, lasix with Potassium chloride and Spironolactone. Pt denies chest pain, shortness of breath   Gastroesophageal reflux disease without esophagitis Pt c/o intermittent nausea. No vomiting. Reports nausea improves when she eats. Denies abdominal pain, burning. On Omeprazole 40 mg po Q Day for GERD already.    Past Medical History:  Diagnosis Date  . Adenoma of rectum   . Adrenal adenoma   . Anemia   . Anemia, unspecified   . Arthritis   . Ascending aortic aneurysm ([place])    a. s/p repair 07/2014  . Atrophic vaginitis   . Benign neoplasm of colon, unspecified   . COPD (chronic obstructive pulmonary disease) ([place])   . Coronary artery disease, non-occlusive    a. LHC 06/2014 without evidence of obstructive disease  . Depression   . Diverticulosis   . Dysphagia   . Dysrhythmia   . Essential hypertension    benign  . Frequent UTI   . GERD (gastroesophageal reflux disease)   . Hemiplegia and hemiparesis ([place])    following cerebral infarction affecting left non-dominant side  . Hypertension   . Microscopic hematuria   . Nonrheumatic aortic valve insufficiency   . Osteoporosis    unspecified  . Panic attacks   . Paroxysmal atrial fibrillation (HCC)   . Pernicious anemia   . Renal cyst    CKD STAGE 3  . Stroke ([place]) 07/23/2014   a. post-operative setting in 07/2014 leading to left UE hemiapresis and left LE weakness  . Urge incontinence    Past Surgical History:  Procedure Laterality Date  . ABDOMINAL HYSTERECTOMY  1972  . AORTOILIAC BYPASS    . ASCENDING AORTIC ANEURYSM REPAIR  07/23/2014   07/2014  . BLEPHAROPLASTY    . CATARACT EXTRACTION  2005  . GASTROSTOMY W/ FEEDING TUBE    . INTRAMEDULLARY (IM) NAIL INTERTROCHANTERIC Left 02/26/2016   Procedure: INTRAMEDULLARY (  IM) NAIL INTERTROCHANTRIC;  Surgeon: Earnestine Leys, MD;  Location: [facility] ORS;  Service: Orthopedics;  Laterality: Left;  . KYPHOPLASTY N/A 08/26/2016   Procedure: KYPHOPLASTY T12;  Surgeon: Hessie Knows, MD;  Location: [facility] ORS;  Service: Orthopedics;  Laterality: N/A;  . LEFT HEART CATH  06/2014  . PR ASCEND AORTIC GRAFT INCL VALVE SUSPENSION  07/07/2014   Procedure: ASCENDING AORTA GRAFT, WITH CARDIOPULMONARY BYPASS, WITH OR WITHOUT VALVE SUSPENSION; Surgeon: Dwaine Deter, MD; Location: MAIN OR [facility]; Service:  Cardiothoracic  . PR LAP, GASTROTOMY W/O TUBE CONSTR  08/08/2014   Procedure: LAPAROSCOPY, SURGICAL; GASTOSTOMY W/O CONSTRUCTION OF GASTRIC TUBE (EG, STAMM PROCEDURE)(SEPARATE PROCED); Surgeon: Fredrik Rigger, MD; Location: MAIN OR [facility]; Service: Gastrointestinal  . PR PLACE PERCUT GASTROSTOMY TUBE  08/08/2014   Procedure: UGI ENDO; W/DIRECTED PLCMT PERQ GASTROSTOMY TUBE; Surgeon: Fredrik Rigger, MD; Location: MAIN OR [facility]; Service: Gastrointestinal    Allergies  Allergen Reactions  . Iodinated Diagnostic Agents Itching and Swelling  . Nitrofurantoin Diarrhea    Other reaction(s): Unknown  . Omnipaque [Iohexol]   . Solifenacin Other (See Comments)    indigestion  . Ciprofloxacin Other (See Comments) and Rash    Colitis  . Metronidazole Itching and Rash  . Sulfa Antibiotics Itching and Rash    Allergies as of 05/27/2017      Reactions   Iodinated Diagnostic Agents Itching, Swelling   Nitrofurantoin Diarrhea   Other reaction(s): Unknown   Omnipaque [iohexol]    Solifenacin Other (See Comments)   indigestion   Ciprofloxacin Other (See Comments), Rash   Colitis   Metronidazole Itching, Rash   Sulfa Antibiotics Itching, Rash      Medication List        Accurate as of 05/27/17 11:59 PM. Always use your most recent med list.          acetaminophen 325 MG tablet Commonly known as:  TYLENOL Take 650 mg by mouth 4 (four) times daily.   albuterol 108 (90 Base) MCG/ACT inhaler Commonly known as:  PROVENTIL HFA;VENTOLIN HFA Inhale 2 puffs into the lungs every 4 (four) hours as needed for wheezing or shortness of breath.   alum & mag hydroxide-simeth 952-841-32 MG/5ML suspension Commonly known as:  MAALOX PLUS Take 30 mLs by mouth every 4 (four) hours as needed.   ASPERCREME LIDOCAINE 4 % Ptch Generic drug:  Lidocaine Apply 1 patch topically daily. Apply to right flank/sore ribs daily. Remove after 12 hours.   atorvastatin 20 MG tablet Commonly known as:   LIPITOR Take 20 mg by mouth at bedtime.   benzonatate 100 MG capsule Commonly known as:  TESSALON Take 100 mg by mouth every 8 (eight) hours as needed for cough.   BREO ELLIPTA 100-25 MCG/INH Aepb Generic drug:  fluticasone furoate-vilanterol Inhale 1 puff into the lungs daily.   Cholecalciferol 4000 units Caps Take 1 capsule by mouth daily.   cyanocobalamin 500 MCG tablet Take 500 mcg by mouth daily.   dextromethorphan-guaiFENesin 30-600 MG 12hr tablet Commonly known as:  MUCINEX DM Take 1 tablet by mouth 2 (two) times daily.   diclofenac sodium 1 % Gel Commonly known as:  VOLTAREN Apply 4 g topically 4 (four) times daily as needed (pain). To lumbar spine   diltiazem 60 MG tablet Commonly known as:  CARDIZEM Take 60 mg by mouth daily.   docusate sodium 100 MG capsule Commonly known as:  COLACE Take 1 capsule (100 mg total) by mouth 2 (two) times daily.   ELIQUIS 2.5 MG  Tabs tablet Generic drug:  apixaban Take 2.5 mg by mouth 2 (two) times daily.   fentaNYL 12 MCG/HR Commonly known as:  DURAGESIC - dosed mcg/hr Place 12.5 mcg onto the skin every 3 (three) days. Apply to back   ferrous sulfate 325 (65 FE) MG tablet Take 1 tablet (325 mg total) by mouth 2 (two) times daily with a meal.   fluticasone 50 MCG/ACT nasal spray Commonly known as:  FLONASE Place 1 spray into both nostrils daily.   furosemide 40 MG tablet Commonly known as:  LASIX Take 40 mg by mouth daily.   gabapentin 100 MG capsule Commonly known as:  NEURONTIN Take 200 mg by mouth 3 (three) times daily. 2 caps   guaiFENesin-codeine 100-10 MG/5ML syrup Commonly known as:  ROBITUSSIN AC Take 5 mLs by mouth every 6 (six) hours as needed for cough.   ipratropium-albuterol 0.5-2.5 (3) MG/3ML Soln Commonly known as:  DUONEB Take 3 mLs by nebulization 3 (three) times daily.   magnesium hydroxide 400 MG/5ML suspension Commonly known as:  MILK OF MAGNESIA Take 30 mLs by mouth daily as needed for  mild constipation.   meloxicam 7.5 MG tablet Commonly known as:  MOBIC Take 7.5 mg by mouth 2 (two) times daily.   methocarbamol 500 MG tablet Commonly known as:  ROBAXIN Take 250 mg by mouth 3 (three) times daily. 1/2 tablet   metoCLOPramide 5 MG tablet Commonly known as:  REGLAN Take 2.5 mg by mouth 2 (two) times daily. 1/2 tab   omeprazole 40 MG capsule Commonly known as:  PRILOSEC Take 40 mg by mouth daily.   ondansetron 4 MG tablet Commonly known as:  ZOFRAN Take 4 mg by mouth every 6 (six) hours as needed for nausea or vomiting.   oxycodone 5 MG capsule Commonly known as:  OXY-IR Take 5 mg by mouth every 4 (four) hours as needed. For moderate or severe pain   OXYGEN Inhale 2 L into the lungs continuous as needed.   polyethylene glycol packet Commonly known as:  MIRALAX Take 17 g by mouth 2 (two) times daily.   potassium chloride SA 20 MEQ tablet Commonly known as:  K-DUR,KLOR-CON Take 20 mEq by mouth daily.   predniSONE 10 MG tablet Commonly known as:  DELTASONE Take 10 mg by mouth daily with breakfast.   REFRESH TEARS 0.5 % Soln Generic drug:  carboxymethylcellulose Place 2 drops into both eyes See admin instructions. 1 to 4 times daily prn   SENNA-PLUS 8.6-50 MG tablet Generic drug:  senna-docusate Take 2 tablets by mouth 2 (two) times daily as needed.   sertraline 100 MG tablet Commonly known as:  ZOLOFT Take 100 mg by mouth daily. 8 pm   sertraline 50 MG tablet Commonly known as:  ZOLOFT Take 50 mg by mouth at bedtime. 8 pm   sodium phosphate Pediatric 3.5-9.5 GM/59ML enema Place 1 enema rectally once as needed for severe constipation. Take once every 3 days if constipation is not relieved by Milk of magnesia or Bisacodyl Suppository   spironolactone 50 MG tablet Commonly known as:  ALDACTONE Take 50 mg by mouth daily.   WHITE PETROLATUM-MINERAL OIL OP Apply 1/4 inch ribbon in both eyes at bedtime for dry eyes  (substitute for Systane  Nighttime Ointment)       Review of Systems  Constitutional: Negative for activity change, appetite change, chills, diaphoresis and fever.  HENT: Negative for congestion, mouth sores, nosebleeds, postnasal drip, sneezing, sore throat, trouble swallowing and voice change.  Respiratory: Negative for apnea, cough, choking, chest tightness, shortness of breath and wheezing.   Cardiovascular: Negative for chest pain, palpitations and leg swelling.  Gastrointestinal: Negative for abdominal distention, abdominal pain, constipation, diarrhea and nausea.  Genitourinary: Negative for difficulty urinating, dysuria, frequency and urgency.  Musculoskeletal: Positive for arthralgias (typical arthritis) and gait problem. Negative for back pain and myalgias.  Skin: Negative for color change, pallor, rash and wound.  Neurological: Positive for weakness. Negative for dizziness, tremors, syncope, speech difficulty, numbness and headaches.  Psychiatric/Behavioral: Negative for agitation and behavioral problems.  All other systems reviewed and are negative.   Immunization History  Administered Date(s) Administered  . Influenza Inj Mdck Quad Pf 02/14/2016  . Influenza-Unspecified 02/02/2014, 01/27/2017  . PPD Test 08/10/2014, 08/24/2014, 02/20/2016  . Pneumococcal-Unspecified 02/20/2016   Pertinent  Health Maintenance Due  Topic Date Due  . DEXA SCAN  07/01/1992  . PNA vac Low Risk Adult (2 of 2 - PCV13) 02/19/2017  . INFLUENZA VACCINE  Completed   No flowsheet data found. Functional Status Survey:    Vitals:   05/27/17 0843  BP: (!) 160/54  Pulse: 72  Resp: 20  Temp: 98.5 F (36.9 C)  TempSrc: Oral  SpO2: 96%  Weight: 114 lb 11.2 oz (52 kg)  Height: '5\' 1"'$  (1.549 m)   Body mass index is 21.67 kg/m. Physical Exam  Constitutional: She is oriented to person, place, and time. Vital signs are normal. She appears well-developed and well-nourished. She is active and cooperative. She does not  appear ill. No distress.  HENT:  Head: Normocephalic and atraumatic.  Mouth/Throat: Uvula is midline, oropharynx is clear and moist and mucous membranes are normal. Mucous membranes are not pale, not dry and not cyanotic.  Eyes: Conjunctivae, EOM and lids are normal. Pupils are equal, round, and reactive to light.  Neck: Trachea normal, normal range of motion and full passive range of motion without pain. Neck supple. No JVD present. No tracheal deviation, no edema and no erythema present. No thyromegaly present.  Cardiovascular: Normal rate, regular rhythm, normal heart sounds, intact distal pulses and normal pulses. Exam reveals no gallop, no distant heart sounds and no friction rub.  No murmur heard. Pulses:      Dorsalis pedis pulses are 2+ on the right side, and 2+ on the left side.  No edema  Pulmonary/Chest: Effort normal and breath sounds normal. No accessory muscle usage. No respiratory distress. She has no decreased breath sounds. She has no wheezes. She has no rhonchi. She has no rales. She exhibits no tenderness.  Abdominal: Soft. Normal appearance and bowel sounds are normal. She exhibits no distension and no ascites. There is no tenderness.  Musculoskeletal: Normal range of motion. She exhibits no edema or tenderness.  Expected osteoarthritis, stiffness; Bilateral Calves soft, supple. Negative Homan's Sign. B- pedal pulses equal; Right Hemiplegia  Neurological: She is alert and oriented to person, place, and time. She has normal strength. She displays atrophy. A cranial nerve deficit and sensory deficit is present. She exhibits abnormal muscle tone. Coordination and gait abnormal.  Right Hemiplegia  Skin: Skin is warm, dry and intact. She is not diaphoretic. No cyanosis. No pallor. Nails show no clubbing.  Psychiatric: She has a normal mood and affect. Her speech is normal and behavior is normal. Judgment and thought content normal. Cognition and memory are normal.  Nursing note and  vitals reviewed.   Labs reviewed: Recent Labs    07/21/16 1500 08/22/16 1036 09/04/16 1700 10/09/16 1500  NA 138  --  140 138  K 4.5 4.9 4.7 5.3*  CL 102  --  105 104  CO2 27  --  29 28  GLUCOSE 202*  --  106* 121*  BUN 18  --  25* 26*  CREATININE 1.28*  --  1.18* 1.43*  CALCIUM 8.4*  --  8.7* 9.0  MG  --   --  2.1  --    Recent Labs    07/21/16 1500 09/04/16 1700 10/09/16 1500  AST '19 18 19  '$ ALT 14 15 12*  ALKPHOS 113 109 88  BILITOT 0.7 0.8 0.8  PROT 5.7* 5.7* 5.6*  ALBUMIN 3.7 3.8 3.7   Recent Labs    07/21/16 1500 09/04/16 1700 10/09/16 1500  WBC 11.1* 7.6 8.6  NEUTROABS 9.9* 6.3 7.7*  HGB 11.2* 9.3* 10.8*  HCT 34.1* 28.1* 32.4*  MCV 90.8 90.7 91.5  PLT 207 204 181   Lab Results  Component Value Date   TSH 2.985 09/04/2016   Lab Results  Component Value Date   HGBA1C 5.9 (H) 02/25/2016   Lab Results  Component Value Date   CHOL 127 02/09/2015   HDL 72 02/09/2015   LDLCALC 49 02/09/2015   TRIG 28 02/09/2015   CHOLHDL 1.8 02/09/2015    Significant Diagnostic Results in last 30 days:  No results found.  Assessment/Plan Zamara was seen today for medical management of chronic issues.  Diagnoses and all orders for this visit:  Benign essential HTN  Paroxysmal atrial fibrillation (HCC)  Nonrheumatic aortic valve insufficiency   above conditions stable  Continue current medication regimen  Continue restorative nursing program  F/U with cardiology as needed  Gastroesophageal reflux disease without esophagitis  Continue Omeprazole 40 mg po Q Day  Add Ranitadine 75 mg po Q Day  Family/ staff Communication:   Total Time:  Documentation:  Face to Face:  Family/Phone:   Labs/tests ordered:  Cbc, met c, Vit D, B12, TSH, mag+, lipid panel  Medication list reviewed and assessed for continued appropriateness. Monthly medication orders reviewed and signed.  Vikki Ports, NP-C Geriatrics [facility] [phone], [place] [address] Cell Phone (Mon-Fri 8am-5pm):  [phone] On Call:  [phone] & follow prompts after 5pm & weekends Office Phone:  [phone] Office Fax:  [phone]

## 2017-05-28 NOTE — Assessment & Plan Note (Signed)
Stable. B/Ps controlled with Cardizem, lasix with Potassium chloride and Spironolactone. Pt denies chest pain, shortness of breath

## 2017-05-29 ENCOUNTER — Other Ambulatory Visit
Admission: RE | Admit: 2017-05-29 | Discharge: 2017-05-29 | Disposition: A | Discharge status: Home / Self Care | Payer: Medicare Other | Source: Ambulatory Visit | Attending: Internal Medicine | Admitting: Internal Medicine

## 2017-05-29 DIAGNOSIS — R109 Unspecified abdominal pain: Secondary | ICD-10-CM | POA: Insufficient documentation

## 2017-05-29 LAB — COMPREHENSIVE METABOLIC PANEL
ALT: 8 U/L — ABNORMAL LOW (ref 14–54)
ANION GAP: 5 (ref 5–15)
AST: 13 U/L — ABNORMAL LOW (ref 15–41)
Albumin: 3.3 g/dL — ABNORMAL LOW (ref 3.5–5.0)
Alkaline Phosphatase: 69 U/L (ref 38–126)
BUN: 33 mg/dL — ABNORMAL HIGH (ref 6–20)
CHLORIDE: 113 mmol/L — AB (ref 101–111)
CO2: 19 mmol/L — ABNORMAL LOW (ref 22–32)
Calcium: 8.8 mg/dL — ABNORMAL LOW (ref 8.9–10.3)
Creatinine, Ser: 1.24 mg/dL — ABNORMAL HIGH (ref 0.44–1.00)
GFR, EST AFRICAN AMERICAN: 43 mL/min — AB (ref >60)
GFR, EST NON AFRICAN AMERICAN: 37 mL/min — AB (ref >60)
Glucose, Bld: 84 mg/dL (ref 65–99)
POTASSIUM: 6 mmol/L — AB (ref 3.5–5.1)
SODIUM: 137 mmol/L (ref 135–145)
Total Bilirubin: 0.5 mg/dL (ref 0.3–1.2)
Total Protein: 5.4 g/dL — ABNORMAL LOW (ref 6.5–8.1)

## 2017-05-29 LAB — CBC WITH DIFFERENTIAL/PLATELET
Basophils Absolute: 0 10*3/uL (ref 0–0.1)
Basophils Relative: 0 %
EOS ABS: 0.1 10*3/uL (ref 0–0.7)
EOS PCT: 1 %
HCT: 29.3 % — ABNORMAL LOW (ref 35.0–47.0)
Hemoglobin: 9.7 g/dL — ABNORMAL LOW (ref 12.0–16.0)
LYMPHS PCT: 18 %
Lymphs Abs: 1.7 10*3/uL (ref 1.0–3.6)
MCH: 29.8 pg (ref 26.0–34.0)
MCHC: 33.1 g/dL (ref 32.0–36.0)
MCV: 90 fL (ref 80.0–100.0)
MONO ABS: 0.6 10*3/uL (ref 0.2–0.9)
Monocytes Relative: 7 %
Neutro Abs: 6.9 10*3/uL — ABNORMAL HIGH (ref 1.4–6.5)
Neutrophils Relative %: 74 %
PLATELETS: 229 10*3/uL (ref 150–440)
RBC: 3.25 MIL/uL — ABNORMAL LOW (ref 3.80–5.20)
RDW: 15.9 % — AB (ref 11.5–14.5)
WBC: 9.3 10*3/uL (ref 3.6–11.0)

## 2017-05-29 LAB — LIPASE, BLOOD: LIPASE: 23 U/L (ref 11–51)

## 2017-06-01 DIAGNOSIS — I69354 Hemiplegia and hemiparesis following cerebral infarction affecting left non-dominant side: Secondary | ICD-10-CM | POA: Diagnosis not present

## 2017-06-01 DIAGNOSIS — M6281 Muscle weakness (generalized): Secondary | ICD-10-CM | POA: Diagnosis not present

## 2017-06-01 DIAGNOSIS — M62422 Contracture of muscle, left upper arm: Secondary | ICD-10-CM | POA: Diagnosis not present

## 2017-06-02 ENCOUNTER — Other Ambulatory Visit
Admission: RE | Admit: 2017-06-02 | Discharge: 2017-06-02 | Disposition: A | Discharge status: Home / Self Care | Payer: Medicare Other | Source: Ambulatory Visit | Attending: Gerontology | Admitting: Gerontology

## 2017-06-02 DIAGNOSIS — I129 Hypertensive chronic kidney disease with stage 1 through stage 4 chronic kidney disease, or unspecified chronic kidney disease: Secondary | ICD-10-CM | POA: Insufficient documentation

## 2017-06-02 LAB — CBC WITH DIFFERENTIAL/PLATELET
BASOS ABS: 0 10*3/uL (ref 0–0.1)
Basophils Relative: 0 %
Eosinophils Absolute: 0.1 10*3/uL (ref 0–0.7)
Eosinophils Relative: 1 %
HEMATOCRIT: 31.7 % — AB (ref 35.0–47.0)
HEMOGLOBIN: 10.6 g/dL — AB (ref 12.0–16.0)
LYMPHS PCT: 17 %
Lymphs Abs: 1.8 10*3/uL (ref 1.0–3.6)
MCH: 30.2 pg (ref 26.0–34.0)
MCHC: 33.4 g/dL (ref 32.0–36.0)
MCV: 90.4 fL (ref 80.0–100.0)
Monocytes Absolute: 0.6 10*3/uL (ref 0.2–0.9)
Monocytes Relative: 6 %
NEUTROS ABS: 8.1 10*3/uL — AB (ref 1.4–6.5)
NEUTROS PCT: 76 %
Platelets: 241 10*3/uL (ref 150–440)
RBC: 3.51 MIL/uL — AB (ref 3.80–5.20)
RDW: 16.4 % — AB (ref 11.5–14.5)
WBC: 10.6 10*3/uL (ref 3.6–11.0)

## 2017-06-02 LAB — LIPID PANEL
CHOL/HDL RATIO: 1.8 ratio
Cholesterol: 127 mg/dL (ref 0–200)
HDL: 72 mg/dL (ref >40)
LDL Cholesterol: 33 mg/dL (ref 0–99)
Triglycerides: 112 mg/dL (ref <150)
VLDL: 22 mg/dL (ref 0–40)

## 2017-06-02 LAB — MAGNESIUM: MAGNESIUM: 1.7 mg/dL (ref 1.7–2.4)

## 2017-06-02 LAB — COMPREHENSIVE METABOLIC PANEL
ALBUMIN: 3.5 g/dL (ref 3.5–5.0)
ALT: 9 U/L — ABNORMAL LOW (ref 14–54)
AST: 14 U/L — AB (ref 15–41)
Alkaline Phosphatase: 71 U/L (ref 38–126)
Anion gap: 5 (ref 5–15)
BILIRUBIN TOTAL: 0.5 mg/dL (ref 0.3–1.2)
BUN: 21 mg/dL — AB (ref 6–20)
CO2: 21 mmol/L — ABNORMAL LOW (ref 22–32)
Calcium: 8.8 mg/dL — ABNORMAL LOW (ref 8.9–10.3)
Chloride: 110 mmol/L (ref 101–111)
Creatinine, Ser: 1.25 mg/dL — ABNORMAL HIGH (ref 0.44–1.00)
GFR calc Af Amer: 43 mL/min — ABNORMAL LOW (ref >60)
GFR calc non Af Amer: 37 mL/min — ABNORMAL LOW (ref >60)
GLUCOSE: 85 mg/dL (ref 65–99)
POTASSIUM: 5.4 mmol/L — AB (ref 3.5–5.1)
Sodium: 136 mmol/L (ref 135–145)
Total Protein: 5.7 g/dL — ABNORMAL LOW (ref 6.5–8.1)

## 2017-06-02 LAB — TSH: TSH: 5.395 u[IU]/mL — AB (ref 0.350–4.500)

## 2017-06-02 LAB — VITAMIN B12: Vitamin B-12: 1324 pg/mL — ABNORMAL HIGH (ref 180–914)

## 2017-06-03 DIAGNOSIS — M6281 Muscle weakness (generalized): Secondary | ICD-10-CM | POA: Diagnosis not present

## 2017-06-03 DIAGNOSIS — M62422 Contracture of muscle, left upper arm: Secondary | ICD-10-CM | POA: Diagnosis not present

## 2017-06-03 DIAGNOSIS — I69354 Hemiplegia and hemiparesis following cerebral infarction affecting left non-dominant side: Secondary | ICD-10-CM | POA: Diagnosis not present

## 2017-06-03 LAB — VITAMIN D 25 HYDROXY (VIT D DEFICIENCY, FRACTURES): VIT D 25 HYDROXY: 41.7 ng/mL (ref 30.0–100.0)

## 2017-06-05 ENCOUNTER — Encounter
Admission: RE | Admit: 2017-06-05 | Discharge: 2017-06-05 | Disposition: A | Discharge status: Home / Self Care | Payer: Medicare Other | Source: Ambulatory Visit | Attending: Internal Medicine | Admitting: Internal Medicine

## 2017-06-07 DIAGNOSIS — M6281 Muscle weakness (generalized): Secondary | ICD-10-CM | POA: Diagnosis not present

## 2017-06-07 DIAGNOSIS — M62422 Contracture of muscle, left upper arm: Secondary | ICD-10-CM | POA: Diagnosis not present

## 2017-06-07 DIAGNOSIS — I69354 Hemiplegia and hemiparesis following cerebral infarction affecting left non-dominant side: Secondary | ICD-10-CM | POA: Diagnosis not present

## 2017-06-08 DIAGNOSIS — M6281 Muscle weakness (generalized): Secondary | ICD-10-CM | POA: Diagnosis not present

## 2017-06-08 DIAGNOSIS — M62422 Contracture of muscle, left upper arm: Secondary | ICD-10-CM | POA: Diagnosis not present

## 2017-06-08 DIAGNOSIS — I69354 Hemiplegia and hemiparesis following cerebral infarction affecting left non-dominant side: Secondary | ICD-10-CM | POA: Diagnosis not present

## 2017-06-09 ENCOUNTER — Other Ambulatory Visit
Admission: RE | Admit: 2017-06-09 | Discharge: 2017-06-09 | Disposition: A | Discharge status: Home / Self Care | Payer: Medicare Other | Source: Ambulatory Visit | Attending: Gerontology | Admitting: Gerontology

## 2017-06-09 DIAGNOSIS — I129 Hypertensive chronic kidney disease with stage 1 through stage 4 chronic kidney disease, or unspecified chronic kidney disease: Secondary | ICD-10-CM | POA: Diagnosis not present

## 2017-06-09 LAB — BASIC METABOLIC PANEL
ANION GAP: 9 (ref 5–15)
BUN: 28 mg/dL — ABNORMAL HIGH (ref 6–20)
CALCIUM: 8.4 mg/dL — AB (ref 8.9–10.3)
CO2: 25 mmol/L (ref 22–32)
Chloride: 105 mmol/L (ref 101–111)
Creatinine, Ser: 1.18 mg/dL — ABNORMAL HIGH (ref 0.44–1.00)
GFR, EST AFRICAN AMERICAN: 46 mL/min — AB (ref >60)
GFR, EST NON AFRICAN AMERICAN: 40 mL/min — AB (ref >60)
GLUCOSE: 76 mg/dL (ref 65–99)
POTASSIUM: 3.8 mmol/L (ref 3.5–5.1)
Sodium: 139 mmol/L (ref 135–145)

## 2017-06-10 ENCOUNTER — Other Ambulatory Visit: Payer: Self-pay

## 2017-06-10 MED ORDER — FENTANYL 25 MCG/HR TD PT72: 25.0000 ug | MEDICATED_PATCH | TRANSDERMAL | 0 refills | Status: DC

## 2017-06-10 NOTE — Telephone Encounter (Signed)
Rx sent to [facility] phone : [phone] , fax : [phone]  

## 2017-06-15 DIAGNOSIS — M62422 Contracture of muscle, left upper arm: Secondary | ICD-10-CM | POA: Diagnosis not present

## 2017-06-15 DIAGNOSIS — I69354 Hemiplegia and hemiparesis following cerebral infarction affecting left non-dominant side: Secondary | ICD-10-CM | POA: Diagnosis not present

## 2017-06-15 DIAGNOSIS — M6281 Muscle weakness (generalized): Secondary | ICD-10-CM | POA: Diagnosis not present

## 2017-06-17 DIAGNOSIS — S52331D Displaced oblique fracture of shaft of right radius, subsequent encounter for closed fracture with routine healing: Secondary | ICD-10-CM | POA: Diagnosis not present

## 2017-06-18 DIAGNOSIS — M6281 Muscle weakness (generalized): Secondary | ICD-10-CM | POA: Diagnosis not present

## 2017-06-18 DIAGNOSIS — M62422 Contracture of muscle, left upper arm: Secondary | ICD-10-CM | POA: Diagnosis not present

## 2017-06-18 DIAGNOSIS — I69354 Hemiplegia and hemiparesis following cerebral infarction affecting left non-dominant side: Secondary | ICD-10-CM | POA: Diagnosis not present

## 2017-06-22 ENCOUNTER — Other Ambulatory Visit: Payer: Self-pay

## 2017-06-22 DIAGNOSIS — M62422 Contracture of muscle, left upper arm: Secondary | ICD-10-CM | POA: Diagnosis not present

## 2017-06-22 DIAGNOSIS — I69354 Hemiplegia and hemiparesis following cerebral infarction affecting left non-dominant side: Secondary | ICD-10-CM | POA: Diagnosis not present

## 2017-06-22 DIAGNOSIS — M6281 Muscle weakness (generalized): Secondary | ICD-10-CM | POA: Diagnosis not present

## 2017-06-22 MED ORDER — GUAIFENESIN-CODEINE 100-10 MG/5ML PO SYRP: 5.0000 mL | ORAL_SOLUTION | Freq: Four times a day (QID) | ORAL | 0 refills | Status: DC | PRN

## 2017-06-22 NOTE — Telephone Encounter (Signed)
Rx sent to [facility] phone : [phone] , fax : [phone]  

## 2017-06-25 DIAGNOSIS — M6281 Muscle weakness (generalized): Secondary | ICD-10-CM | POA: Diagnosis not present

## 2017-06-25 DIAGNOSIS — M62422 Contracture of muscle, left upper arm: Secondary | ICD-10-CM | POA: Diagnosis not present

## 2017-06-25 DIAGNOSIS — I69354 Hemiplegia and hemiparesis following cerebral infarction affecting left non-dominant side: Secondary | ICD-10-CM | POA: Diagnosis not present

## 2017-07-01 ENCOUNTER — Other Ambulatory Visit: Payer: Self-pay

## 2017-07-01 MED ORDER — FENTANYL 25 MCG/HR TD PT72: 25.0000 ug | MEDICATED_PATCH | TRANSDERMAL | 0 refills | Status: DC

## 2017-07-01 NOTE — Telephone Encounter (Signed)
Rx sent to [facility] phone : [phone] , fax : [phone]  

## 2017-07-02 DIAGNOSIS — M62422 Contracture of muscle, left upper arm: Secondary | ICD-10-CM | POA: Diagnosis not present

## 2017-07-02 DIAGNOSIS — M6281 Muscle weakness (generalized): Secondary | ICD-10-CM | POA: Diagnosis not present

## 2017-07-02 DIAGNOSIS — I69354 Hemiplegia and hemiparesis following cerebral infarction affecting left non-dominant side: Secondary | ICD-10-CM | POA: Diagnosis not present

## 2017-07-03 ENCOUNTER — Encounter
Admission: RE | Admit: 2017-07-03 | Discharge: 2017-07-03 | Disposition: A | Discharge status: Home / Self Care | Payer: Medicare Other | Source: Ambulatory Visit | Attending: Internal Medicine | Admitting: Internal Medicine

## 2017-07-03 DIAGNOSIS — M62422 Contracture of muscle, left upper arm: Secondary | ICD-10-CM | POA: Diagnosis not present

## 2017-07-03 DIAGNOSIS — I69354 Hemiplegia and hemiparesis following cerebral infarction affecting left non-dominant side: Secondary | ICD-10-CM | POA: Diagnosis not present

## 2017-07-03 DIAGNOSIS — M6281 Muscle weakness (generalized): Secondary | ICD-10-CM | POA: Diagnosis not present

## 2017-07-06 DIAGNOSIS — M62422 Contracture of muscle, left upper arm: Secondary | ICD-10-CM | POA: Diagnosis not present

## 2017-07-06 DIAGNOSIS — I69354 Hemiplegia and hemiparesis following cerebral infarction affecting left non-dominant side: Secondary | ICD-10-CM | POA: Diagnosis not present

## 2017-07-06 DIAGNOSIS — M6281 Muscle weakness (generalized): Secondary | ICD-10-CM | POA: Diagnosis not present

## 2017-07-08 DIAGNOSIS — M62422 Contracture of muscle, left upper arm: Secondary | ICD-10-CM | POA: Diagnosis not present

## 2017-07-08 DIAGNOSIS — I69354 Hemiplegia and hemiparesis following cerebral infarction affecting left non-dominant side: Secondary | ICD-10-CM | POA: Diagnosis not present

## 2017-07-08 DIAGNOSIS — M6281 Muscle weakness (generalized): Secondary | ICD-10-CM | POA: Diagnosis not present

## 2017-07-13 DIAGNOSIS — M62422 Contracture of muscle, left upper arm: Secondary | ICD-10-CM | POA: Diagnosis not present

## 2017-07-13 DIAGNOSIS — I69354 Hemiplegia and hemiparesis following cerebral infarction affecting left non-dominant side: Secondary | ICD-10-CM | POA: Diagnosis not present

## 2017-07-13 DIAGNOSIS — M6281 Muscle weakness (generalized): Secondary | ICD-10-CM | POA: Diagnosis not present

## 2017-07-16 DIAGNOSIS — M62422 Contracture of muscle, left upper arm: Secondary | ICD-10-CM | POA: Diagnosis not present

## 2017-07-16 DIAGNOSIS — I69354 Hemiplegia and hemiparesis following cerebral infarction affecting left non-dominant side: Secondary | ICD-10-CM | POA: Diagnosis not present

## 2017-07-16 DIAGNOSIS — M6281 Muscle weakness (generalized): Secondary | ICD-10-CM | POA: Diagnosis not present

## 2017-07-21 DIAGNOSIS — M6281 Muscle weakness (generalized): Secondary | ICD-10-CM | POA: Diagnosis not present

## 2017-07-21 DIAGNOSIS — I69354 Hemiplegia and hemiparesis following cerebral infarction affecting left non-dominant side: Secondary | ICD-10-CM | POA: Diagnosis not present

## 2017-07-21 DIAGNOSIS — M62422 Contracture of muscle, left upper arm: Secondary | ICD-10-CM | POA: Diagnosis not present

## 2017-07-22 DIAGNOSIS — M62422 Contracture of muscle, left upper arm: Secondary | ICD-10-CM | POA: Diagnosis not present

## 2017-07-22 DIAGNOSIS — I69354 Hemiplegia and hemiparesis following cerebral infarction affecting left non-dominant side: Secondary | ICD-10-CM | POA: Diagnosis not present

## 2017-07-22 DIAGNOSIS — M6281 Muscle weakness (generalized): Secondary | ICD-10-CM | POA: Diagnosis not present

## 2017-07-23 ENCOUNTER — Encounter: Payer: Self-pay | Admitting: Gerontology

## 2017-07-23 ENCOUNTER — Non-Acute Institutional Stay (SKILLED_NURSING_FACILITY): Payer: Medicare Other | Admitting: Gerontology

## 2017-07-23 DIAGNOSIS — R05 Cough: Secondary | ICD-10-CM | POA: Diagnosis not present

## 2017-07-23 DIAGNOSIS — S52301S Unspecified fracture of shaft of right radius, sequela: Secondary | ICD-10-CM | POA: Diagnosis not present

## 2017-07-23 DIAGNOSIS — S52301A Unspecified fracture of shaft of right radius, initial encounter for closed fracture: Secondary | ICD-10-CM | POA: Insufficient documentation

## 2017-07-23 DIAGNOSIS — J449 Chronic obstructive pulmonary disease, unspecified: Secondary | ICD-10-CM

## 2017-07-23 DIAGNOSIS — R053 Chronic cough: Secondary | ICD-10-CM

## 2017-07-23 NOTE — Assessment & Plan Note (Signed)
Pt reports she had been feeling better and free of pain until recently. She is worried she may have used it too much or moved it wrong. She has had a sudden worsening/ recurrence of pain in the forearm. She is wearing her brace as instructed by Orthopedics. Pt reports she is going back to Ortho next week, so she declines xrays, etc. Denies numbness, tingling of the fingers. Fingers warm. Good grip strength. Cap refills WNL Pt continues on APAP 650 mg QID. Encouraged use of prn Oxycodone if needed. Fentanyl patch still in place. Will add Lidocaine patch to the area until seen by Ortho. Pt agreeable.

## 2017-07-23 NOTE — Assessment & Plan Note (Addendum)
Stable. No recent copd exacerbations. Pt denies chest pains or shortness of breath. Pt does c/o a chronic cough. Symptoms controlled with Breo Ellipta 100-25- 1 puff once daily, Albuterol 90 mcg Q 4 hour prn rescue inhaler, Mucinex DM 30-600 1 tab Q 12 hours, scheduled TID Duonebs. Not on oxygen.

## 2017-07-23 NOTE — Assessment & Plan Note (Addendum)
Stable. There has been no change to the cough that has been chronically present for a year. This cough started last winter when pt contracted the Flu from + family members. Non-productive. Afebrile. Worse in the morning. No wheezing, rhonchi. Lobes clear. Res states she feels like the irritation causing the cough is more from the trachea, not the lungs. Using Mucinex DM 30-600 mg 1 po BID, Tessalon 100 mg perles Q 8 hours prn. Denies coughing with po intake.

## 2017-07-23 NOTE — Progress Notes (Signed)
Location:    Nursing Home Room Number: 319A Place of Service:  SNF (31) Provider:  Toni Arthurs, NP-C  Juluis Pitch, MD  Patient Care Team: Juluis Pitch, MD as PCP - General [facility])  Extended Emergency Contact Information Primary Emergency Contact: [name] Address: [address]          [place], [place] [address] [name] of [place] Phone: [phone] Relation: Daughter Secondary Emergency Contact: Lingerfelt,Brad Address: [address] [place], [place] [address] [name] of [place] Phone: [phone] Relation: [name]  Code Status:  DNR Goals of care: Advanced Directive information Advanced Directives 07/23/2017  Does Patient Have a Medical Advance Directive? Yes  Type of Advance Directive Out of facility DNR (pink MOST or yellow form)  Does patient want to make changes to medical advance directive? No - Patient declined  Would patient like information on creating a medical advance directive? -  Pre-existing out of facility DNR order (yellow form or pink MOST form) Yellow form placed in chart (order not valid for inpatient use)     Chief Complaint  Patient presents with  . Medical Management of Chronic Issues    Routine Visit    HPI:  Pt is a 84 y.o. female seen today for medical management of chronic diseases.    Chronic obstructive pulmonary disease (COPD) (HCC) Stable. No recent copd exacerbations. Pt denies chest pains or shortness of breath. Pt does c/o a chronic cough. Symptoms controlled with Breo Ellipta 100-25- 1 puff once daily, Albuterol 90 mcg Q 4 hour prn rescue inhaler, Mucinex DM 30-600 1 tab Q 12 hours, scheduled TID Duonebs. Not on oxygen.  Chronic cough Stable. There has been no change to the cough that has been chronically present for a year. This cough started last winter when pt contracted the Flu from + family members. Non-productive. Afebrile. Worse in the morning. No wheezing, rhonchi.  Lobes clear. Res states she feels like the irritation causing the cough is more from the trachea, not the lungs. Using Mucinex DM 30-600 mg 1 po BID, Tessalon 100 mg perles Q 8 hours prn. Denies coughing with po intake.  Closed fracture of shaft of right radius Pt reports she had been feeling better and free of pain until recently. She is worried she may have used it too much or moved it wrong. She has had a sudden worsening/ recurrence of pain in the forearm. She is wearing her brace as instructed by Orthopedics. Pt reports she is going back to Ortho next week, so she declines xrays, etc. Denies numbness, tingling of the fingers. Fingers warm. Good grip strength. Cap refills WNL Pt continues on APAP 650 mg QID. Encouraged use of prn Oxycodone if needed. Fentanyl patch still in place. Will add Lidocaine patch to the area until seen by Ortho. Pt agreeable.     Past Medical History:  Diagnosis Date  . Adenoma of rectum   . Adrenal adenoma   . Anemia   . Anemia, unspecified   . Arthritis   . Ascending aortic aneurysm ([place])    a. s/p repair 07/2014  . Atrophic vaginitis   . Benign neoplasm of colon, unspecified   . COPD (chronic obstructive pulmonary disease) ([place])   . Coronary artery disease, non-occlusive    a. LHC 06/2014 without evidence of obstructive disease  . Depression   . Diverticulosis   . Dysphagia   . Dysrhythmia   . Essential  hypertension    benign  . Frequent UTI   . GERD (gastroesophageal reflux disease)   . Hemiplegia and hemiparesis ([place])    following cerebral infarction affecting left non-dominant side  . Hypertension   . Microscopic hematuria   . Nonrheumatic aortic valve insufficiency   . Osteoporosis    unspecified  . Panic attacks   . Paroxysmal atrial fibrillation (HCC)   . Pernicious anemia   . Renal cyst    CKD STAGE 3  . Stroke ([place]) 07/23/2014   a. post-operative setting in 07/2014 leading to left UE hemiapresis and left LE weakness  . Urge incontinence     Past Surgical History:  Procedure Laterality Date  . ABDOMINAL HYSTERECTOMY  1972  . AORTOILIAC BYPASS    . ASCENDING AORTIC ANEURYSM REPAIR  07/23/2014   07/2014  . BLEPHAROPLASTY    . CATARACT EXTRACTION  2005  . GASTROSTOMY W/ FEEDING TUBE    . INTRAMEDULLARY (IM) NAIL INTERTROCHANTERIC Left 02/26/2016   Procedure: INTRAMEDULLARY (IM) NAIL INTERTROCHANTRIC;  Surgeon: Earnestine Leys, MD;  Location: [facility] ORS;  Service: Orthopedics;  Laterality: Left;  . KYPHOPLASTY N/A 08/26/2016   Procedure: KYPHOPLASTY T12;  Surgeon: Hessie Knows, MD;  Location: [facility] ORS;  Service: Orthopedics;  Laterality: N/A;  . LEFT HEART CATH  06/2014  . PR ASCEND AORTIC GRAFT INCL VALVE SUSPENSION  07/07/2014   Procedure: ASCENDING AORTA GRAFT, WITH CARDIOPULMONARY BYPASS, WITH OR WITHOUT VALVE SUSPENSION; Surgeon: Dwaine Deter, MD; Location: [facility]; Service: Cardiothoracic  . PR LAP, GASTROTOMY W/O TUBE CONSTR  08/08/2014   Procedure: LAPAROSCOPY, SURGICAL; GASTOSTOMY W/O CONSTRUCTION OF GASTRIC TUBE (EG, STAMM PROCEDURE)(SEPARATE PROCED); Surgeon: Fredrik Rigger, MD; Location: MA[facility]; Service: Gastrointestinal  . PR PLACE PERCUT GASTROSTOMY TUBE  08/08/2014   Procedure: UGI ENDO; W/DIRECTED PLCMT PERQ GASTROSTOMY TUBE; Surgeon: Fredrik Rigger, MD; Location: [facility]; Service: Gastrointestinal    Allergies  Allergen Reactions  . Iodinated Diagnostic Agents Itching and Swelling    Other reaction(s): Unknown  . Nitrofurantoin Diarrhea    Other reaction(s): Unknown  . Omnipaque [Iohexol]   . Solifenacin Other (See Comments)    indigestion  . Ciprofloxacin Other (See Comments) and Rash    Colitis  . Metronidazole Itching and Rash  . Sulfa Antibiotics Itching and Rash    Allergies as of 07/23/2017      Reactions   Iodinated Diagnostic Agents Itching, Swelling   Other reaction(s): Unknown   Nitrofurantoin Diarrhea   Other reaction(s): Unknown   Omnipaque [iohexol]     Solifenacin Other (See Comments)   indigestion   Ciprofloxacin Other (See Comments), Rash   Colitis   Metronidazole Itching, Rash   Sulfa Antibiotics Itching, Rash      Medication List        Accurate as of 07/23/17  9:16 PM. Always use your most recent med list.          acetaminophen 325 MG tablet Commonly known as:  TYLENOL Take 650 mg by mouth 4 (four) times daily.   albuterol 108 (90 Base) MCG/ACT inhaler Commonly known as:  PROVENTIL HFA;VENTOLIN HFA Inhale 2 puffs into the lungs every 4 (four) hours as needed for wheezing or shortness of breath.   alum & mag hydroxide-simeth 169-450-38 MG/5ML suspension Commonly known as:  MAALOX PLUS Take 30 mLs by mouth every 4 (four) hours as needed.   ASPERCREME LIDOCAINE 4 % Ptch Generic drug:  Lidocaine Apply 1 patch topically daily. Apply one patch to Right  Flank/sore ribs and one patch to the right forearm daily. Remove after 12 hours   atorvastatin 20 MG tablet Commonly known as:  LIPITOR Take 20 mg by mouth at bedtime.   benzonatate 100 MG capsule Commonly known as:  TESSALON Take 100 mg by mouth every 8 (eight) hours as needed for cough.   BREO ELLIPTA 100-25 MCG/INH Aepb Generic drug:  fluticasone furoate-vilanterol Inhale 1 puff into the lungs daily. Rinse mouth well after use   Cholecalciferol 4000 units Caps Take 1 capsule by mouth daily.   dextromethorphan-guaiFENesin 30-600 MG 12hr tablet Commonly known as:  MUCINEX DM Take 1 tablet by mouth 2 (two) times daily.   diclofenac sodium 1 % Gel Commonly known as:  VOLTAREN Apply 4 g topically 4 (four) times daily as needed (pain). To lumbar spine   diltiazem 60 MG tablet Commonly known as:  CARDIZEM Take 60 mg by mouth daily.   docusate sodium 100 MG capsule Commonly known as:  COLACE Take 1 capsule (100 mg total) by mouth 2 (two) times daily.   ELIQUIS 2.5 MG Tabs tablet Generic drug:  apixaban Take 2.5 mg by mouth 2 (two) times daily.     fentaNYL 25 MCG/HR patch Commonly known as:  DURAGESIC - dosed mcg/hr Place 1 patch (25 mcg total) onto the skin every 3 (three) days.   ferrous sulfate 325 (65 FE) MG tablet Take 1 tablet (325 mg total) by mouth 2 (two) times daily with a meal.   fluticasone 50 MCG/ACT nasal spray Commonly known as:  FLONASE Place 1 spray into both nostrils daily.   gabapentin 100 MG capsule Commonly known as:  NEURONTIN Take 200 mg by mouth 3 (three) times daily. 2 caps   guaiFENesin-codeine 100-10 MG/5ML syrup Commonly known as:  ROBITUSSIN AC Take 5 mLs by mouth every 6 (six) hours as needed for cough.   ipratropium-albuterol 0.5-2.5 (3) MG/3ML Soln Commonly known as:  DUONEB Take 3 mLs by nebulization 3 (three) times daily.   magnesium hydroxide 400 MG/5ML suspension Commonly known as:  MILK OF MAGNESIA Take 30 mLs by mouth daily as needed for mild constipation.   methocarbamol 500 MG tablet Commonly known as:  ROBAXIN Take 250 mg by mouth 3 (three) times daily. 1/2 tablet   metoCLOPramide 5 MG tablet Commonly known as:  REGLAN Take 2.5 mg by mouth 2 (two) times daily. 1/2 tab   oxycodone 5 MG capsule Commonly known as:  OXY-IR Take 5 mg by mouth every 4 (four) hours as needed. For moderate or severe pain   OXYGEN Inhale 2 L into the lungs continuous as needed.   pantoprazole 40 MG tablet Commonly known as:  PROTONIX Take 40 mg by mouth daily.   polyethylene glycol packet Commonly known as:  MIRALAX Take 17 g by mouth 2 (two) times daily.   potassium chloride SA 20 MEQ tablet Commonly known as:  K-DUR,KLOR-CON Take 20 mEq by mouth daily.   predniSONE 10 MG tablet Commonly known as:  DELTASONE Take 10 mg by mouth daily with breakfast.   ranitidine 75 MG tablet Commonly known as:  ZANTAC Take 75 mg by mouth daily.   REFRESH TEARS 0.5 % Soln Generic drug:  carboxymethylcellulose Place 2 drops into both eyes See admin instructions. 1 to 4 times daily prn    SENNA-PLUS 8.6-50 MG tablet Generic drug:  senna-docusate Take 2 tablets by mouth 2 (two) times daily as needed.   sertraline 100 MG tablet Commonly known as:  ZOLOFT Take  100 mg by mouth daily. 8 pm   sertraline 50 MG tablet Commonly known as:  ZOLOFT Take 50 mg by mouth at bedtime. 8 pm   sodium phosphate Pediatric 3.5-9.5 GM/59ML enema Place 1 enema rectally once as needed for severe constipation. Take once every 3 days if constipation is not relieved by Milk of magnesia or Bisacodyl Suppository   SYSTANE 0.4-0.3 % Gel ophthalmic gel Generic drug:  Polyethyl Glycol-Propyl Glycol Place 1 application into both eyes at bedtime. (2 drops ) for dry eyes (Ointments are on Backorder)   vitamin B-12 250 MCG tablet Commonly known as:  CYANOCOBALAMIN Take 250 mcg by mouth daily.       Review of Systems  Constitutional: Negative for activity change, appetite change, chills, diaphoresis and fever.  HENT: Negative for congestion, ear pain, facial swelling, mouth sores, nosebleeds, postnasal drip, rhinorrhea, sinus pressure, sinus pain, sneezing, sore throat, trouble swallowing and voice change.   Eyes: Negative for pain, discharge and itching.  Respiratory: Positive for cough. Negative for apnea, choking, chest tightness, shortness of breath and wheezing.   Cardiovascular: Negative for chest pain, palpitations and leg swelling.  Gastrointestinal: Negative for abdominal distention, abdominal pain, constipation, diarrhea and nausea.  Genitourinary: Negative for difficulty urinating, dysuria, frequency and urgency.  Musculoskeletal: Positive for arthralgias (typical arthritis) and myalgias. Negative for back pain and gait problem.  Skin: Negative for color change, pallor, rash and wound.  Neurological: Negative for dizziness, tremors, syncope, speech difficulty, weakness, numbness and headaches. Facial asymmetry: chronic.  Psychiatric/Behavioral: Negative for agitation and behavioral  problems.  All other systems reviewed and are negative.   Immunization History  Administered Date(s) Administered  . Influenza Inj Mdck Quad Pf 02/14/2016  . Influenza-Unspecified 02/02/2014, 01/27/2017  . PPD Test 08/10/2014, 08/24/2014, 02/20/2016  . Pneumococcal-Unspecified 02/20/2016   Pertinent  Health Maintenance Due  Topic Date Due  . DEXA SCAN  07/01/1992  . PNA vac Low Risk Adult (2 of 2 - PCV13) 02/19/2017  . INFLUENZA VACCINE  Completed   No flowsheet data found. Functional Status Survey:    Vitals:   07/23/17 1536  BP: 136/68  Pulse: 78  Resp: 16  Temp: (!) 96.6 F (35.9 C)  TempSrc: Oral  SpO2: 96%  Weight: 119 lb 6.4 oz (54.2 kg)  Height: 5' 1" (1.549 m)   Body mass index is 22.56 kg/m. Physical Exam  Constitutional: She is oriented to person, place, and time. Vital signs are normal. She appears well-developed and well-nourished. She is active and cooperative. She does not appear ill. No distress.  HENT:  Head: Normocephalic and atraumatic.  Mouth/Throat: Uvula is midline, oropharynx is clear and moist and mucous membranes are normal. Mucous membranes are not pale, not dry and not cyanotic.  Eyes: Pupils are equal, round, and reactive to light. Conjunctivae, EOM and lids are normal.  Neck: Trachea normal, normal range of motion and full passive range of motion without pain. Neck supple. No JVD present. No tracheal deviation, no edema and no erythema present. No thyromegaly present.  Cardiovascular: Normal rate, regular rhythm, intact distal pulses and normal pulses. Exam reveals gallop and S3 (intermittent). Exam reveals no distant heart sounds and no friction rub.  No murmur heard. Pulses:      Dorsalis pedis pulses are 2+ on the right side, and 2+ on the left side.  No edema  Pulmonary/Chest: Effort normal and breath sounds normal. No accessory muscle usage. No respiratory distress. She has no decreased breath sounds. She has no  wheezes. She has no  rhonchi. She has no rales. She exhibits no tenderness.  Abdominal: Soft. Normal appearance and bowel sounds are normal. She exhibits no distension and no ascites. There is no tenderness.  Musculoskeletal: Normal range of motion. She exhibits no edema.       Right forearm: She exhibits tenderness and bony tenderness.  Expected osteoarthritis, stiffness; Bilateral Calves soft, supple. Negative Homan's Sign. B- pedal pulses equal  Neurological: She is alert and oriented to person, place, and time. She has normal strength. She displays atrophy. A cranial nerve deficit and sensory deficit is present. She exhibits abnormal muscle tone. Coordination and gait abnormal.  Left Hemiplegia  Skin: Skin is warm, dry and intact. She is not diaphoretic. No cyanosis. No pallor. Nails show no clubbing.  Psychiatric: She has a normal mood and affect. Her speech is normal and behavior is normal. Judgment and thought content normal. Cognition and memory are normal.  Nursing note and vitals reviewed.   Labs reviewed: Recent Labs    09/04/16 1700  05/29/17 0500 06/02/17 0640 06/09/17 0700  NA 140   < > 137 136 139  K 4.7   < > 6.0* 5.4* 3.8  CL 105   < > 113* 110 105  CO2 29   < > 19* 21* 25  GLUCOSE 106*   < > 84 85 76  BUN 25*   < > 33* 21* 28*  CREATININE 1.18*   < > 1.24* 1.25* 1.18*  CALCIUM 8.7*   < > 8.8* 8.8* 8.4*  MG 2.1  --   --  1.7  --    < > = values in this interval not displayed.   Recent Labs    10/09/16 1500 05/29/17 0500 06/02/17 0640  AST 19 13* 14*  ALT 12* 8* 9*  ALKPHOS 88 69 71  BILITOT 0.8 0.5 0.5  PROT 5.6* 5.4* 5.7*  ALBUMIN 3.7 3.3* 3.5   Recent Labs    10/09/16 1500 05/29/17 0500 06/02/17 0640  WBC 8.6 9.3 10.6  NEUTROABS 7.7* 6.9* 8.1*  HGB 10.8* 9.7* 10.6*  HCT 32.4* 29.3* 31.7*  MCV 91.5 90.0 90.4  PLT 181 229 241   Lab Results  Component Value Date   TSH 5.395 (H) 06/02/2017   Lab Results  Component Value Date   HGBA1C 5.9 (H) 02/25/2016   Lab  Results  Component Value Date   CHOL 127 06/02/2017   HDL 72 06/02/2017   LDLCALC 33 06/02/2017   TRIG 112 06/02/2017   CHOLHDL 1.8 06/02/2017    Significant Diagnostic Results in last 30 days:  No results found.  Assessment/Plan [place] was seen today for medical management of chronic issues.  Diagnoses and all orders for this visit:  Chronic obstructive pulmonary disease, unspecified COPD type (HCC)  Chronic cough  Closed fracture of shaft of right radius, unspecified fracture morphology, sequela   COPD and cough stable  Worsening pain with radius fracture  Follow up with Ortho next week as instructed  Add another Aspercreme 4% Lidocaine patch- to the right forearm daily  Continue Fentanyl 12 mch patch Q 3 days  Continue Oxycodone 5 mg PO Q 4 hours prn  Decrease methocarbamol to 250 mg po BID  Add Singulair 10 mg po Q HS  Increase Ranitadine 75 mg po BID  Increase Torsemide 10 mg tablets- 1 1/2 tablets (15 mg) po Q Day  Recheck Met B next week for potassium level  Family/ staff Communication:   Total  Time:  Documentation:  Face to Face:  Family/Phone:   Labs/tests ordered:  Not due, met b 1 week  Medication list reviewed and assessed for continued appropriateness. Monthly medication orders reviewed and signed.  Vikki Ports, NP-C Geriatrics [facility] [phone] [place], [place] [address] Cell Phone (Mon-Fri 8am-5pm):  [phone] On Call:  [phone] & follow prompts after 5pm & weekends Office Phone:  [phone] Office Fax:  [phone]

## 2017-07-29 DIAGNOSIS — S52331D Displaced oblique fracture of shaft of right radius, subsequent encounter for closed fracture with routine healing: Secondary | ICD-10-CM | POA: Diagnosis not present

## 2017-07-30 ENCOUNTER — Other Ambulatory Visit
Admission: RE | Admit: 2017-07-30 | Discharge: 2017-07-30 | Disposition: A | Discharge status: Home / Self Care | Payer: Medicare Other | Source: Ambulatory Visit | Attending: Gerontology | Admitting: Gerontology

## 2017-07-30 DIAGNOSIS — N189 Chronic kidney disease, unspecified: Secondary | ICD-10-CM | POA: Insufficient documentation

## 2017-07-30 DIAGNOSIS — I129 Hypertensive chronic kidney disease with stage 1 through stage 4 chronic kidney disease, or unspecified chronic kidney disease: Secondary | ICD-10-CM | POA: Insufficient documentation

## 2017-07-30 LAB — BASIC METABOLIC PANEL
ANION GAP: 9 (ref 5–15)
BUN: 23 mg/dL — AB (ref 6–20)
CALCIUM: 8.6 mg/dL — AB (ref 8.9–10.3)
CO2: 27 mmol/L (ref 22–32)
Chloride: 105 mmol/L (ref 101–111)
Creatinine, Ser: 1.18 mg/dL — ABNORMAL HIGH (ref 0.44–1.00)
GFR calc Af Amer: 46 mL/min — ABNORMAL LOW (ref >60)
GFR, EST NON AFRICAN AMERICAN: 39 mL/min — AB (ref >60)
GLUCOSE: 86 mg/dL (ref 65–99)
Potassium: 4.1 mmol/L (ref 3.5–5.1)
Sodium: 141 mmol/L (ref 135–145)

## 2017-08-03 ENCOUNTER — Encounter
Admission: RE | Admit: 2017-08-03 | Discharge: 2017-08-03 | Disposition: A | Discharge status: Home / Self Care | Payer: Medicare Other | Source: Ambulatory Visit | Attending: Internal Medicine | Admitting: Internal Medicine

## 2017-08-05 ENCOUNTER — Other Ambulatory Visit: Payer: Self-pay

## 2017-08-05 MED ORDER — FENTANYL 12 MCG/HR TD PT72: 12.5000 ug | MEDICATED_PATCH | TRANSDERMAL | 0 refills | Status: DC

## 2017-08-05 NOTE — Telephone Encounter (Signed)
Rx sent to [facility] phone : [phone] , fax : [phone]  

## 2017-08-26 ENCOUNTER — Encounter: Payer: Self-pay | Admitting: Gerontology

## 2017-08-26 ENCOUNTER — Non-Acute Institutional Stay (SKILLED_NURSING_FACILITY): Payer: Medicare Other | Admitting: Gerontology

## 2017-08-26 DIAGNOSIS — N183 Chronic kidney disease, stage 3 unspecified: Secondary | ICD-10-CM

## 2017-08-26 DIAGNOSIS — D510 Vitamin B12 deficiency anemia due to intrinsic factor deficiency: Secondary | ICD-10-CM

## 2017-08-26 DIAGNOSIS — I129 Hypertensive chronic kidney disease with stage 1 through stage 4 chronic kidney disease, or unspecified chronic kidney disease: Secondary | ICD-10-CM

## 2017-08-28 NOTE — Assessment & Plan Note (Signed)
Stable. B12 level supratherapeutic. Dose reduced to 250 mcg.

## 2017-08-28 NOTE — Progress Notes (Signed)
Location:    Nursing Home Room Number: 319A Place of Service:  SNF (31) Provider:  Toni Arthurs, NP-C  Juluis Pitch, MD  Patient Care Team: Juluis Pitch, MD as PCP - General [facility])  Extended Emergency Contact Information Primary Emergency Contact: [name] Address: [address]          [place], [place] [address] [name] of [place] Phone: [phone] Relation: Daughter Secondary Emergency Contact: Lingerfelt,Brad Address: [address] [place], [place] [address] [name] of [place] Phone: [phone] Relation: [name]  Code Status:  DNR Goals of care: Advanced Directive information Advanced Directives 08/26/2017  Does Patient Have a Medical Advance Directive? Yes  Type of Advance Directive Out of facility DNR (pink MOST or yellow form)  Does patient want to make changes to medical advance directive? No - Patient declined  Would patient like information on creating a medical advance directive? -  Pre-existing out of facility DNR order (yellow form or pink MOST form) Yellow form placed in chart (order not valid for inpatient use)     Chief Complaint  Patient presents with  . Medical Management of Chronic Issues    Routine Visit    HPI:  Pt is a 84 y.o. female seen today for medical management of chronic diseases.    Hypertensive chronic kidney disease w stg 1-4/unsp chr kdny Stable. Renal values stable/ improved. Renally adjust meds as appropriate. B/Ps stable. No recent episodes of hyper or hypotension. No chest pain or shortness of breath.  Chronic kidney disease, stage III (moderate) Stable. Renal values improved/stable. Renally adjust meds as appropriate  Vitamin B12 deficiency anemia due to intrinsic factor deficiency Stable. B12 level supratherapeutic. Dose reduced to 250 mcg.    Past Medical History:  Diagnosis Date  . Adenoma of rectum   . Adrenal adenoma   . Anemia   . Anemia, unspecified   .  Arthritis   . Ascending aortic aneurysm ([place])    a. s/p repair 07/2014  . Atrophic vaginitis   . Benign neoplasm of colon, unspecified   . COPD (chronic obstructive pulmonary disease) ([place])   . Coronary artery disease, non-occlusive    a. LHC 06/2014 without evidence of obstructive disease  . Depression   . Diverticulosis   . Dysphagia   . Dysrhythmia   . Essential hypertension    benign  . Frequent UTI   . GERD (gastroesophageal reflux disease)   . Hemiplegia and hemiparesis ([place] [facility])    following cerebral infarction affecting left non-dominant side  . Hypertension   . Microscopic hematuria   . Nonrheumatic aortic valve insufficiency   . Osteoporosis    unspecified  . Panic attacks   . Paroxysmal atrial fibrillation ([facility])   . Pernicious anemia   . Renal cyst    CKD STAGE 3  . Stroke ([facility]) 07/23/2014   a. post-operative setting in 07/2014 leading to left UE hemiapresis and left LE weakness  . Urge incontinence    Past Surgical History:  Procedure Laterality Date  . ABDOMINAL HYSTERECTOMY  1972  . AORTOILIAC BYPASS    . ASCENDING AORTIC ANEURYSM REPAIR  07/23/2014   07/2014  . BLEPHAROPLASTY    . CATARACT EXTRACTION  2005  . GASTROSTOMY W/ FEEDING TUBE    . INTRAMEDULLARY (IM) NAIL INTERTROCHANTERIC Left 02/26/2016   Procedure: INTRAMEDULLARY (IM) NAIL INTERTROCHANTRIC;  Surgeon: Earnestine Leys, MD;  Location: [facility] ORS;  Service: Orthopedics;  Laterality: Left;  .  KYPHOPLASTY N/A 08/26/2016   Procedure: KYPHOPLASTY T12;  Surgeon: Hessie Knows, MD;  Location: [facility] ORS;  Service: Orthopedics;  Laterality: N/A;  . LEFT HEART CATH  06/2014  . PR ASCEND AORTIC GRAFT INCL VALVE SUSPENSION  07/07/2014   Procedure: ASCENDING AORTA GRAFT, WITH CARDIOPULMONARY BYPASS, WITH OR WITHOUT VALVE SUSPENSION; Surgeon: Dwaine Deter, MD; Location: MAIN [facility]; Service: Cardiothoracic  . PR LAP, GASTROTOMY W/O TUBE CONSTR  08/08/2014   Procedure: LAPAROSCOPY, SURGICAL; GASTOSTOMY W/O  CONSTRUCTION OF GASTRIC TUBE (EG, STAMM PROCEDURE)(SEPARATE PROCED); Surgeon: Fredrik Rigger, MD; Location: [facility]; Service: Gastrointestinal  . PR PLACE PERCUT GASTROSTOMY TUBE  08/08/2014   Procedure: UGI ENDO; W/DIRECTED PLCMT PERQ GASTROSTOMY TUBE; Surgeon: Fredrik Rigger, MD; Location: MA[facility]; Service: Gastrointestinal    Allergies  Allergen Reactions  . Iodinated Diagnostic Agents Itching and Swelling    Other reaction(s): Unknown  . Nitrofurantoin Diarrhea    Other reaction(s): Unknown  . Omnipaque [Iohexol]   . Solifenacin Other (See Comments)    indigestion  . Ciprofloxacin Other (See Comments) and Rash    Colitis  . Metronidazole Itching and Rash  . Sulfa Antibiotics Itching and Rash    Allergies as of 08/26/2017      Reactions   Iodinated Diagnostic Agents Itching, Swelling   Other reaction(s): Unknown   Nitrofurantoin Diarrhea   Other reaction(s): Unknown   Omnipaque [iohexol]    Solifenacin Other (See Comments)   indigestion   Ciprofloxacin Other (See Comments), Rash   Colitis   Metronidazole Itching, Rash   Sulfa Antibiotics Itching, Rash      Medication List        Accurate as of 08/26/17 11:59 PM. Always use your most recent med list.          acetaminophen 325 MG tablet Commonly known as:  TYLENOL Take 650 mg by mouth 4 (four) times daily.   albuterol 108 (90 Base) MCG/ACT inhaler Commonly known as:  PROVENTIL HFA;VENTOLIN HFA Inhale 2 puffs into the lungs every 4 (four) hours as needed for wheezing or shortness of breath.   alum & mag hydroxide-simeth 270-623-76 MG/5ML suspension Commonly known as:  MAALOX PLUS Take 30 mLs by mouth every 4 (four) hours as needed.   ASPERCREME LIDOCAINE 4 % Ptch Generic drug:  Lidocaine Apply 1 patch topically daily. Apply one patch to Right Flank/sore ribs and one patch to the right forearm daily. Remove after 12 hours   atorvastatin 20 MG tablet Commonly known as:   LIPITOR Take 20 mg by mouth at bedtime.   benzonatate 100 MG capsule Commonly known as:  TESSALON Take 100 mg by mouth every 8 (eight) hours as needed for cough.   BREO ELLIPTA 100-25 MCG/INH Aepb Generic drug:  fluticasone furoate-vilanterol Inhale 1 puff into the lungs daily. Rinse mouth well after use   Cholecalciferol 4000 units Caps Take 1 capsule by mouth daily.   diclofenac sodium 1 % Gel Commonly known as:  VOLTAREN Apply 4 g topically 4 (four) times daily as needed (pain). To lumbar spine   diltiazem 60 MG tablet Commonly known as:  CARDIZEM Take 60 mg by mouth daily.   docusate sodium 100 MG capsule Commonly known as:  COLACE Take 1 capsule (100 mg total) by mouth 2 (two) times daily.   ELIQUIS 2.5 MG Tabs tablet Generic drug:  apixaban Take 2.5 mg by mouth 2 (two) times daily.   ferrous sulfate 325 (65 FE) MG tablet Take 1 tablet (325 mg  total) by mouth 2 (two) times daily with a meal.   fluticasone 50 MCG/ACT nasal spray Commonly known as:  FLONASE Place 1 spray into both nostrils daily.   gabapentin 100 MG capsule Commonly known as:  NEURONTIN Take 200 mg by mouth 3 (three) times daily. 2 caps   guaiFENesin-codeine 100-10 MG/5ML syrup Commonly known as:  ROBITUSSIN AC Take 5 mLs by mouth every 6 (six) hours as needed for cough.   ipratropium-albuterol 0.5-2.5 (3) MG/3ML Soln Commonly known as:  DUONEB Take 3 mLs by nebulization 3 (three) times daily.   magnesium hydroxide 400 MG/5ML suspension Commonly known as:  MILK OF MAGNESIA Take 30 mLs by mouth daily as needed for mild constipation.   methocarbamol 500 MG tablet Commonly known as:  ROBAXIN Take 250 mg by mouth 3 (three) times daily. 1/2 tablet   metoCLOPramide 5 MG tablet Commonly known as:  REGLAN Take 2.5 mg by mouth 2 (two) times daily. 1/2 tab   oxycodone 5 MG capsule Commonly known as:  OXY-IR Take 5 mg by mouth every 4 (four) hours as needed. For moderate or severe pain    OXYGEN Inhale 2 L into the lungs continuous as needed.   pantoprazole 40 MG tablet Commonly known as:  PROTONIX Take 40 mg by mouth daily.   polyethylene glycol packet Commonly known as:  MIRALAX Take 17 g by mouth 2 (two) times daily.   potassium chloride SA 20 MEQ tablet Commonly known as:  K-DUR,KLOR-CON Take 20 mEq by mouth daily.   predniSONE 10 MG tablet Commonly known as:  DELTASONE Take 10 mg by mouth daily with breakfast.   ranitidine 75 MG tablet Commonly known as:  ZANTAC Take 75 mg by mouth 2 (two) times daily.   REFRESH TEARS 0.5 % Soln Generic drug:  carboxymethylcellulose Place 2 drops into both eyes See admin instructions. 1 to 4 times daily prn   SENNA-PLUS 8.6-50 MG tablet Generic drug:  senna-docusate Take 2 tablets by mouth 2 (two) times daily as needed.   sertraline 100 MG tablet Commonly known as:  ZOLOFT Take 100 mg by mouth daily. 8 pm   sertraline 50 MG tablet Commonly known as:  ZOLOFT Take 50 mg by mouth at bedtime. 8 pm   sodium phosphate Pediatric 3.5-9.5 GM/59ML enema Place 1 enema rectally once as needed for severe constipation. Take once every 3 days if constipation is not relieved by Milk of magnesia or Bisacodyl Suppository   SYSTANE 0.4-0.3 % Gel ophthalmic gel Generic drug:  Polyethyl Glycol-Propyl Glycol Place 1 application into both eyes at bedtime. (2 drops ) for dry eyes (Ointments are on Backorder)   torsemide 10 MG tablet Commonly known as:  DEMADEX Take 15 mg by mouth daily. 1 and 1/2 tab   vitamin B-12 250 MCG tablet Commonly known as:  CYANOCOBALAMIN Take 250 mcg by mouth daily.       Review of Systems  Constitutional: Negative for activity change, appetite change, chills, diaphoresis and fever.  HENT: Negative for congestion, mouth sores, nosebleeds, postnasal drip, sneezing, sore throat, trouble swallowing and voice change.   Respiratory: Negative for apnea, cough, choking, chest tightness, shortness of  breath and wheezing.   Cardiovascular: Negative for chest pain, palpitations and leg swelling.  Gastrointestinal: Negative for abdominal distention, abdominal pain, constipation, diarrhea and nausea.  Genitourinary: Negative for difficulty urinating, dysuria, frequency and urgency.  Musculoskeletal: Positive for arthralgias (typical arthritis). Negative for back pain, gait problem and myalgias.  Skin: Negative for color  change, pallor, rash and wound.  Neurological: Positive for weakness. Negative for dizziness, tremors, syncope, speech difficulty, numbness and headaches.  Psychiatric/Behavioral: Negative for agitation and behavioral problems.  All other systems reviewed and are negative.   Immunization History  Administered Date(s) Administered  . Influenza Inj Mdck Quad Pf 02/14/2016  . Influenza-Unspecified 02/02/2014, 01/27/2017  . PPD Test 08/10/2014, 08/24/2014, 02/20/2016  . Pneumococcal-Unspecified 02/20/2016   Pertinent  Health Maintenance Due  Topic Date Due  . DEXA SCAN  07/01/1992  . PNA vac Low Risk Adult (2 of 2 - PCV13) 02/19/2017  . INFLUENZA VACCINE  12/03/2017   No flowsheet data found. Functional Status Survey:    Vitals:   08/26/17 1102  BP: (!) 142/60  Pulse: 64  Resp: 20  Temp: (!) 97.2 F (36.2 C)  TempSrc: Oral  SpO2: 96%  Weight: 122 lb 6.4 oz (55.5 kg)  Height: 5\' 1"  (1.549 m)   Body mass index is 23.13 kg/m. Physical Exam  Constitutional: She is oriented to person, place, and time. Vital signs are normal. She appears well-developed and well-nourished. She is active and cooperative. She does not appear ill. No distress.  HENT:  Head: Normocephalic and atraumatic.  Mouth/Throat: Uvula is midline, oropharynx is clear and moist and mucous membranes are normal. Mucous membranes are not pale, not dry and not cyanotic.  Eyes: Pupils are equal, round, and reactive to light. Conjunctivae, EOM and lids are normal.  Neck: Trachea normal, normal range of  motion and full passive range of motion without pain. Neck supple. No JVD present. No tracheal deviation, no edema and no erythema present. No thyromegaly present.  Cardiovascular: Normal rate, regular rhythm, normal heart sounds, intact distal pulses and normal pulses. Exam reveals no gallop, no distant heart sounds and no friction rub.  No murmur heard. Pulses:      Dorsalis pedis pulses are 2+ on the right side, and 2+ on the left side.  No edema  Pulmonary/Chest: Effort normal and breath sounds normal. No accessory muscle usage. No respiratory distress. She has no decreased breath sounds. She has no wheezes. She has no rhonchi. She has no rales. She exhibits no tenderness.  Abdominal: Soft. Normal appearance and bowel sounds are normal. She exhibits no distension and no ascites. There is no tenderness.  Musculoskeletal: Normal range of motion. She exhibits no edema or tenderness.  Expected osteoarthritis, stiffness; Bilateral Calves soft, supple. Negative Homan's Sign. B- pedal pulses equal, Left Hemiparesis  Neurological: She is alert and oriented to person, place, and time. She has normal strength. She displays atrophy. A cranial nerve deficit and sensory deficit is present. She exhibits abnormal muscle tone. Coordination and gait abnormal.  Left Hemiparesis  Skin: Skin is warm, dry and intact. She is not diaphoretic. No cyanosis. No pallor. Nails show no clubbing.  Psychiatric: She has a normal mood and affect. Her speech is normal and behavior is normal. Judgment and thought content normal. Cognition and memory are normal.  Nursing note and vitals reviewed.   Labs reviewed: Recent Labs    09/04/16 1700  06/02/17 0640 06/09/17 0700 07/30/17 0430  NA 140   < > 136 139 141  K 4.7   < > 5.4* 3.8 4.1  CL 105   < > 110 105 105  CO2 29   < > 21* 25 27  GLUCOSE 106*   < > 85 76 86  BUN 25*   < > 21* 28* 23*  CREATININE 1.18*   < >  1.25* 1.18* 1.18*  CALCIUM 8.7*   < > 8.8* 8.4* 8.6*   MG 2.1  --  1.7  --   --    < > = values in this interval not displayed.   Recent Labs    10/09/16 1500 05/29/17 0500 06/02/17 0640  AST 19 13* 14*  ALT 12* 8* 9*  ALKPHOS 88 69 71  BILITOT 0.8 0.5 0.5  PROT 5.6* 5.4* 5.7*  ALBUMIN 3.7 3.3* 3.5   Recent Labs    10/09/16 1500 05/29/17 0500 06/02/17 0640  WBC 8.6 9.3 10.6  NEUTROABS 7.7* 6.9* 8.1*  HGB 10.8* 9.7* 10.6*  HCT 32.4* 29.3* 31.7*  MCV 91.5 90.0 90.4  PLT 181 229 241   Lab Results  Component Value Date   TSH 5.395 (H) 06/02/2017   Lab Results  Component Value Date   HGBA1C 5.9 (H) 02/25/2016   Lab Results  Component Value Date   CHOL 127 06/02/2017   HDL 72 06/02/2017   LDLCALC 33 06/02/2017   TRIG 112 06/02/2017   CHOLHDL 1.8 06/02/2017    Significant Diagnostic Results in last 30 days:  No results found.  Assessment/Plan [place] was seen today for medical management of chronic issues.  Diagnoses and all orders for this visit:  Hypertensive chronic kidney disease w stg 1-4/unsp chr kdny  Chronic kidney disease, stage III (moderate) (HCC)  Vitamin B12 deficiency anemia due to intrinsic factor deficiency   Above listed conditions stable  Continue current medication regimen  Renally adjust medications as appropriate  Safety precautions  Fall Precautions  Assist with ADLs as appropriate  Family/ staff Communication:   Total Time:  Documentation:  Face to Face:  Family/Phone:   Labs/tests ordered: not due  Medication list reviewed and assessed for continued appropriateness. Monthly medication orders reviewed and signed.  Vikki Ports, NP-C Geriatrics [facility] [id number], [place] [address] Cell Phone (Mon-Fri 8am-5pm):  [phone] On Call:  [phone] & follow prompts after 5pm & weekends Office Phone:  [phone] Office Fax:  [phone]

## 2017-08-28 NOTE — Assessment & Plan Note (Signed)
Stable. Renal values improved/stable. Renally adjust meds as appropriate

## 2017-08-28 NOTE — Assessment & Plan Note (Addendum)
Stable. Renal values stable/ improved. Renally adjust meds as appropriate. B/Ps stable. No recent episodes of hyper or hypotension. No chest pain or shortness of breath.

## 2017-09-02 ENCOUNTER — Encounter
Admission: RE | Admit: 2017-09-02 | Discharge: 2017-09-02 | Disposition: A | Discharge status: Home / Self Care | Payer: Medicare Other | Source: Ambulatory Visit | Attending: Internal Medicine | Admitting: Internal Medicine

## 2017-09-22 ENCOUNTER — Non-Acute Institutional Stay (SKILLED_NURSING_FACILITY): Payer: Medicare Other | Admitting: Gerontology

## 2017-09-22 ENCOUNTER — Encounter: Payer: Self-pay | Admitting: Gerontology

## 2017-09-22 DIAGNOSIS — Z87891 Personal history of nicotine dependence: Secondary | ICD-10-CM | POA: Diagnosis not present

## 2017-09-22 DIAGNOSIS — F339 Major depressive disorder, recurrent, unspecified: Secondary | ICD-10-CM

## 2017-09-22 DIAGNOSIS — G8929 Other chronic pain: Secondary | ICD-10-CM | POA: Diagnosis not present

## 2017-09-22 DIAGNOSIS — F17201 Nicotine dependence, unspecified, in remission: Secondary | ICD-10-CM | POA: Diagnosis not present

## 2017-09-26 NOTE — Assessment & Plan Note (Signed)
Stable. No recent evidence of worsening depression. No crying, no withdrawal. Pt is interactive with other residents and participates in activities. Symptoms managed with Zoloft 150 mg po Q Day

## 2017-09-26 NOTE — Assessment & Plan Note (Deleted)
Stable. Was able to successfully wean pt off the Fentanyl patch used for acute on chronic pain after pt sustained an elbow fracture. Pt reports she does not have any pain currently. Pt remains on Tylenol 650 mg po QID, Robaxin 250 mg po BID, Gabapentin 200 mg po TID and Oxycodone 5 mg po Q 4 hours prn.

## 2017-09-26 NOTE — Assessment & Plan Note (Signed)
Stable. Was able to successfully wean pt off the Fentanyl patch used for acute on chronic pain after pt sustained an elbow fracture. Pt reports she does not have any pain currently. Pt remains on Tylenol 650 mg po QID, Robaxin 250 mg po BID, Gabapentin 200 mg po TID and Oxycodone 5 mg po Q 4 hours prn.

## 2017-09-26 NOTE — Progress Notes (Signed)
Location:    Nursing Home Room Number: 319A Place of Service:  SNF (31) Provider:  Toni Arthurs, NP-C  Juluis Pitch, MD  Patient Care Team: Juluis Pitch, MD as PCP - [facility])  Extended Emergency Contact Information Primary Emergency Contact: [name] Address: [address]          [place], [place] [address] [name] of [place] Phone: [phone] Relation: Daughter Secondary Emergency Contact: Lingerfelt,Brad Address: [address] [place], [place] [address] [name] of [place] Phone: [phone] Relation: [name]  Code Status: DNR Goals of care: Advanced Directive information Advanced Directives 09/22/2017  Does Patient Have a Medical Advance Directive? Yes  Type of Advance Directive Out of facility DNR (pink MOST or yellow form)  Does patient want to make changes to medical advance directive? No - Patient declined  Would patient like information on creating a medical advance directive? -  Pre-existing out of facility DNR order (yellow form or pink MOST form) Yellow form placed in chart (order not valid for inpatient use)     Chief Complaint  Patient presents with  . Medical Management of Chronic Issues    Routine Visit    HPI:  Pt is a 84 y.o. female seen today for medical management of chronic diseases.    Tobacco abuse, in remission Personal history of nicotine dependence Stable. Pt has been nicotine free for 3 years. No complaints.   Major depressive disorder, single episode in full remission ([place]) Stable. No recent evidence of worsening depression. No crying, no withdrawal. Pt is interactive with other residents and participates in activities. Symptoms managed with Zoloft 150 mg po Q Day  Other Chronic pain Stable. Was able to successfully wean pt off the Fentanyl patch used for acute on chronic pain after pt sustained an elbow fracture. Pt reports she does not have any pain currently. Pt remains on  Tylenol 650 mg po QID, Robaxin 250 mg po BID, Gabapentin 200 mg po TID and Oxycodone 5 mg po Q 4 hours prn.     Past Medical History:  Diagnosis Date  . Adenoma of rectum   . Adrenal adenoma   . Anemia   . Anemia, unspecified   . Arthritis   . Ascending aortic aneurysm ([place])    a. s/p repair 07/2014  . Atrophic vaginitis   . Benign neoplasm of colon, unspecified   . COPD (chronic obstructive pulmonary disease) ([place])   . Coronary artery disease, non-occlusive    a. LHC 06/2014 without evidence of obstructive disease  . Depression   . Diverticulosis   . Dysphagia   . Dysrhythmia   . Essential hypertension    benign  . Frequent UTI   . GERD (gastroesophageal reflux disease)   . Hemiplegia and hemiparesis ([place])    following cerebral infarction affecting left non-dominant side  . Hypertension   . Microscopic hematuria   . Nonrheumatic aortic valve insufficiency   . Osteoporosis    unspecified  . Panic attacks   . Paroxysmal atrial fibrillation ([facility])   . Pernicious anemia   . Renal cyst    CKD STAGE 3  . Stroke ([place]) 07/23/2014   a. post-operative setting in 07/2014 leading to left UE hemiapresis and left LE weakness  . Urge incontinence    Past Surgical History:  Procedure Laterality Date  . ABDOMINAL HYSTERECTOMY  1972  . AORTOILIAC BYPASS    . ASCENDING AORTIC ANEURYSM REPAIR  07/23/2014   07/2014  . BLEPHAROPLASTY    . CATARACT EXTRACTION  2005  . GASTROSTOMY W/ FEEDING TUBE    . INTRAMEDULLARY (IM) NAIL INTERTROCHANTERIC Left 02/26/2016   Procedure: INTRAMEDULLARY (IM) NAIL INTERTROCHANTRIC;  Surgeon: Earnestine Leys, MD;  Location: [facility] ORS;  Service: Orthopedics;  Laterality: Left;  . KYPHOPLASTY N/A 08/26/2016   Procedure: KYPHOPLASTY T12;  Surgeon: Hessie Knows, MD;  Location: [facility] ORS;  Service: Orthopedics;  Laterality: N/A;  . LEFT HEART CATH  06/2014  . PR ASCEND AORTIC GRAFT INCL VALVE SUSPENSION  07/07/2014   Procedure: ASCENDING AORTA GRAFT, WITH  CARDIOPULMONARY BYPASS, WITH OR WITHOUT VALVE SUSPENSION; Surgeon: Dwaine Deter, MD; Location: [facility]; Service: Cardiothoracic  . PR LAP, GASTROTOMY W/O TUBE CONSTR  08/08/2014   Procedure: LAPAROSCOPY, SURGICAL; GASTOSTOMY W/O CONSTRUCTION OF GASTRIC TUBE (EG, STAMM PROCEDURE)(SEPARATE PROCED); Surgeon: Fredrik Rigger, MD; Location: MAIN OR [facility]; Service: Gastrointestinal  . PR PLACE PERCUT GASTROSTOMY TUBE  08/08/2014   Procedure: UGI ENDO; W/DIRECTED PLCMT PERQ GASTROSTOMY TUBE; Surgeon: Fredrik Rigger, MD; Location: MAIN OR [facility]; Service: Gastrointestinal    Allergies  Allergen Reactions  . Iodinated Diagnostic Agents Itching and Swelling    Other reaction(s): Unknown  . Nitrofurantoin Diarrhea    Other reaction(s): Unknown  . Omnipaque [Iohexol]   . Solifenacin Other (See Comments)    indigestion  . Ciprofloxacin Other (See Comments) and Rash    Colitis  . Metronidazole Itching and Rash  . Sulfa Antibiotics Itching and Rash    Allergies as of 09/22/2017      Reactions   Iodinated Diagnostic Agents Itching, Swelling   Other reaction(s): Unknown   Nitrofurantoin Diarrhea   Other reaction(s): Unknown   Omnipaque [iohexol]    Solifenacin Other (See Comments)   indigestion   Ciprofloxacin Other (See Comments), Rash   Colitis   Metronidazole Itching, Rash   Sulfa Antibiotics Itching, Rash      Medication List        Accurate as of 09/22/17 11:59 PM. Always use your most recent med list.          acetaminophen 325 MG tablet Commonly known as:  TYLENOL Take 650 mg by mouth 4 (four) times daily.   albuterol 108 (90 Base) MCG/ACT inhaler Commonly known as:  PROVENTIL HFA;VENTOLIN HFA Inhale 2 puffs into the lungs every 4 (four) hours as needed for wheezing or shortness of breath.   alum & mag hydroxide-simeth 427-062-37 MG/5ML suspension Commonly known as:  MAALOX PLUS Take 30 mLs by mouth every 4 (four) hours as needed.   ASPERCREME  LIDOCAINE 4 % Ptch Generic drug:  Lidocaine Apply 1 patch topically daily. Apply one patch to Right Flank/sore ribs and one patch to the right forearm daily. Remove after 12 hours   atorvastatin 20 MG tablet Commonly known as:  LIPITOR Take 20 mg by mouth at bedtime.   benzonatate 100 MG capsule Commonly known as:  TESSALON Take 100 mg by mouth every 8 (eight) hours as needed for cough.   BREO ELLIPTA 100-25 MCG/INH Aepb Generic drug:  fluticasone furoate-vilanterol Inhale 1 puff into the lungs daily. Rinse mouth well after use   Cholecalciferol 4000 units Caps Take 1 capsule by mouth daily.   diclofenac sodium 1 % Gel Commonly known as:  VOLTAREN Apply 4 g topically 4 (four) times daily as needed (pain). To lumbar spine   diltiazem 60 MG tablet Commonly known as:  CARDIZEM Take 60 mg by mouth daily.   docusate  sodium 100 MG capsule Commonly known as:  COLACE Take 1 capsule (100 mg total) by mouth 2 (two) times daily.   ELIQUIS 2.5 MG Tabs tablet Generic drug:  apixaban Take 2.5 mg by mouth 2 (two) times daily.   ferrous sulfate 325 (65 FE) MG tablet Take 1 tablet (325 mg total) by mouth 2 (two) times daily with a meal.   fluticasone 50 MCG/ACT nasal spray Commonly known as:  FLONASE Place 1 spray into both nostrils daily.   gabapentin 100 MG capsule Commonly known as:  NEURONTIN Take 200 mg by mouth 3 (three) times daily. 2 caps   guaiFENesin-codeine 100-10 MG/5ML syrup Commonly known as:  ROBITUSSIN AC Take 5 mLs by mouth every 6 (six) hours as needed for cough.   ipratropium-albuterol 0.5-2.5 (3) MG/3ML Soln Commonly known as:  DUONEB Take 3 mLs by nebulization 3 (three) times daily.   lanolin/mineral oil Lotn Apply liberal amount daily topically to arms and legs after the bath and PRN for skin integrity   magnesium hydroxide 400 MG/5ML suspension Commonly known as:  MILK OF MAGNESIA Take 30 mLs by mouth daily as needed for mild constipation.     methocarbamol 500 MG tablet Commonly known as:  ROBAXIN Take 250 mg by mouth 2 (two) times daily. 1/2 tablet   metoCLOPramide 5 MG tablet Commonly known as:  REGLAN Take 2.5 mg by mouth 2 (two) times daily. 1/2 tab   montelukast 10 MG tablet Commonly known as:  SINGULAIR Take 10 mg by mouth at bedtime.   oxycodone 5 MG capsule Commonly known as:  OXY-IR Take 5 mg by mouth every 4 (four) hours as needed. For moderate or severe pain   OXYGEN Inhale 2 L into the lungs continuous as needed.   pantoprazole 40 MG tablet Commonly known as:  PROTONIX Take 40 mg by mouth daily.   polyethylene glycol packet Commonly known as:  MIRALAX Take 17 g by mouth 2 (two) times daily.   potassium chloride SA 20 MEQ tablet Commonly known as:  K-DUR,KLOR-CON Take 20 mEq by mouth daily.   predniSONE 10 MG tablet Commonly known as:  DELTASONE Take 10 mg by mouth daily with breakfast.   ranitidine 75 MG tablet Commonly known as:  ZANTAC Take 75 mg by mouth 2 (two) times daily.   REFRESH TEARS 0.5 % Soln Generic drug:  carboxymethylcellulose Place 2 drops into both eyes See admin instructions. 1 to 4 times daily prn   SENNA-PLUS 8.6-50 MG tablet Generic drug:  senna-docusate Take 2 tablets by mouth 2 (two) times daily as needed.   sertraline 100 MG tablet Commonly known as:  ZOLOFT Take 100 mg by mouth daily. 8 pm   sertraline 50 MG tablet Commonly known as:  ZOLOFT Take 50 mg by mouth at bedtime. 8 pm   sodium phosphate Pediatric 3.5-9.5 GM/59ML enema Place 1 enema rectally once as needed for severe constipation. Take once every 3 days if constipation is not relieved by Milk of magnesia or Bisacodyl Suppository   SYSTANE 0.4-0.3 % Gel ophthalmic gel Generic drug:  Polyethyl Glycol-Propyl Glycol Place 1 application into both eyes at bedtime. (2 drops ) for dry eyes (Ointments are on Backorder)   torsemide 10 MG tablet Commonly known as:  DEMADEX Take 15 mg by mouth daily. 1 and  1/2 tab   vitamin B-12 250 MCG tablet Commonly known as:  CYANOCOBALAMIN Take 250 mcg by mouth daily.       Review of Systems  Constitutional:  Negative for activity change, appetite change, chills, diaphoresis and fever.  HENT: Negative for congestion, mouth sores, nosebleeds, postnasal drip, sneezing, sore throat, trouble swallowing and voice change.   Respiratory: Negative for apnea, cough, choking, chest tightness, shortness of breath and wheezing.   Cardiovascular: Negative for chest pain, palpitations and leg swelling.  Gastrointestinal: Negative for abdominal distention, abdominal pain, constipation, diarrhea and nausea.  Genitourinary: Negative for difficulty urinating, dysuria, frequency and urgency.  Musculoskeletal: Positive for arthralgias (typical arthritis) and gait problem. Negative for back pain and myalgias.  Skin: Negative for color change, pallor, rash and wound.  Neurological: Positive for weakness. Negative for dizziness, tremors, syncope, speech difficulty, numbness and headaches.  Psychiatric/Behavioral: Negative for agitation and behavioral problems.  All other systems reviewed and are negative.   Immunization History  Administered Date(s) Administered  . Influenza Inj Mdck Quad Pf 02/14/2016  . Influenza-Unspecified 02/02/2014, 01/27/2017  . PPD Test 08/10/2014, 08/24/2014, 02/20/2016  . Pneumococcal-Unspecified 02/20/2016   Pertinent  Health Maintenance Due  Topic Date Due  . DEXA SCAN  07/01/1992  . PNA vac Low Risk Adult (2 of 2 - PCV13) 02/19/2017  . INFLUENZA VACCINE  12/03/2017   No flowsheet data found. Functional Status Survey:    Vitals:   09/22/17 1056  BP: (!) 154/60  Pulse: 68  Resp: 18  Temp: (!) 97 F (36.1 C)  TempSrc: Oral  SpO2: 98%  Weight: 124 lb 4.8 oz (56.4 kg)  Height: 5\' 1"  (1.549 m)   Body mass index is 23.49 kg/m. Physical Exam  Constitutional: She is oriented to person, place, and time. Vital signs are normal.  She appears well-developed and well-nourished. She is active and cooperative. She does not appear ill. No distress.  HENT:  Head: Normocephalic and atraumatic.  Mouth/Throat: Uvula is midline, oropharynx is clear and moist and mucous membranes are normal. Mucous membranes are not pale, not dry and not cyanotic.  Eyes: Pupils are equal, round, and reactive to light. Conjunctivae, EOM and lids are normal.  Neck: Trachea normal, normal range of motion and full passive range of motion without pain. Neck supple. No JVD present. No tracheal deviation, no edema and no erythema present. No thyromegaly present.  Cardiovascular: Normal rate, regular rhythm, normal heart sounds, intact distal pulses and normal pulses. Exam reveals no gallop, no distant heart sounds and no friction rub.  No murmur heard. Pulses:      Dorsalis pedis pulses are 2+ on the right side, and 2+ on the left side.  No edema  Pulmonary/Chest: Effort normal and breath sounds normal. No accessory muscle usage. No respiratory distress. She has no decreased breath sounds. She has no wheezes. She has no rhonchi. She has no rales. She exhibits no tenderness.  Abdominal: Soft. Normal appearance and bowel sounds are normal. She exhibits no distension and no ascites. There is no tenderness.  Musculoskeletal: Normal range of motion. She exhibits no edema or tenderness.  Expected osteoarthritis, stiffness; Bilateral Calves soft, supple. Negative Homan's Sign. B- pedal pulses equal; left hemiplegai  Neurological: She is alert and oriented to person, place, and time. She has normal strength. She displays atrophy. She exhibits abnormal muscle tone. Coordination and gait abnormal.  Left hemiplegia  Skin: Skin is warm, dry and intact. She is not diaphoretic. No cyanosis. No pallor. Nails show no clubbing.  Psychiatric: She has a normal mood and affect. Her speech is normal and behavior is normal. Judgment and thought content normal. Cognition and  memory are normal.  Nursing note and vitals reviewed.   Labs reviewed: Recent Labs    06/02/17 0640 06/09/17 0700 07/30/17 0430  NA 136 139 141  K 5.4* 3.8 4.1  CL 110 105 105  CO2 21* 25 27  GLUCOSE 85 76 86  BUN 21* 28* 23*  CREATININE 1.25* 1.18* 1.18*  CALCIUM 8.8* 8.4* 8.6*  MG 1.7  --   --    Recent Labs    10/09/16 1500 05/29/17 0500 06/02/17 0640  AST 19 13* 14*  ALT 12* 8* 9*  ALKPHOS 88 69 71  BILITOT 0.8 0.5 0.5  PROT 5.6* 5.4* 5.7*  ALBUMIN 3.7 3.3* 3.5   Recent Labs    10/09/16 1500 05/29/17 0500 06/02/17 0640  WBC 8.6 9.3 10.6  NEUTROABS 7.7* 6.9* 8.1*  HGB 10.8* 9.7* 10.6*  HCT 32.4* 29.3* 31.7*  MCV 91.5 90.0 90.4  PLT 181 229 241   Lab Results  Component Value Date   TSH 5.395 (H) 06/02/2017   Lab Results  Component Value Date   HGBA1C 5.9 (H) 02/25/2016   Lab Results  Component Value Date   CHOL 127 06/02/2017   HDL 72 06/02/2017   LDLCALC 33 06/02/2017   TRIG 112 06/02/2017   CHOLHDL 1.8 06/02/2017    Significant Diagnostic Results in last 30 days:  No results found.  Assessment/Plan Ana Schultz was seen today for medical management of chronic issues.  Diagnoses and all orders for this visit:  Personal history of nicotine dependence  Tobacco abuse, in remission  Recurrent major depressive disorder, remission status unspecified ([place])  Other Chronic pain   Above listed conditions stable  Continue current medication regimen  Continue to encourage ongoing participation in activities and interaction with other residents  Monitor for worsening pain  Monitor for s/s of worsening depression  Safety precautions  Fall precautions  Labs next month  Family/ staff Communication:   Total Time:  Documentation:  Face to Face:  Family/Phone:   Labs/tests ordered:  Not yet due  Medication list reviewed and assessed for continued appropriateness. Monthly medication orders reviewed and signed.  Vikki Ports,  NP-C Geriatrics [facility] [phone], [place] [address] Cell Phone (Mon-Fri 8am-5pm):  [phone] On Call:  [phone] & follow prompts after 5pm & weekends Office Phone:  [phone] Office Fax:  [phone]

## 2017-09-26 NOTE — Assessment & Plan Note (Signed)
Stable. Pt has been nicotine free for 3 years. No complaints.

## 2017-10-03 ENCOUNTER — Encounter
Admission: RE | Admit: 2017-10-03 | Discharge: 2017-10-03 | Disposition: A | Discharge status: Home / Self Care | Payer: Medicare Other | Source: Ambulatory Visit | Attending: Internal Medicine | Admitting: Internal Medicine

## 2017-10-28 ENCOUNTER — Encounter: Payer: Self-pay | Admitting: Gerontology

## 2017-10-29 ENCOUNTER — Encounter: Payer: Self-pay | Admitting: Internal Medicine

## 2017-11-02 ENCOUNTER — Encounter
Admission: RE | Admit: 2017-11-02 | Discharge: 2017-11-02 | Disposition: A | Discharge status: Home / Self Care | Payer: Medicare Other | Source: Ambulatory Visit | Attending: Internal Medicine | Admitting: Internal Medicine

## 2017-11-03 ENCOUNTER — Other Ambulatory Visit
Admission: RE | Admit: 2017-11-03 | Discharge: 2017-11-03 | Disposition: A | Discharge status: Home / Self Care | Payer: Medicare Other | Source: Ambulatory Visit | Attending: Gerontology | Admitting: Gerontology

## 2017-11-03 DIAGNOSIS — I129 Hypertensive chronic kidney disease with stage 1 through stage 4 chronic kidney disease, or unspecified chronic kidney disease: Secondary | ICD-10-CM | POA: Diagnosis not present

## 2017-11-03 DIAGNOSIS — R279 Unspecified lack of coordination: Secondary | ICD-10-CM | POA: Diagnosis not present

## 2017-11-03 DIAGNOSIS — I69354 Hemiplegia and hemiparesis following cerebral infarction affecting left non-dominant side: Secondary | ICD-10-CM | POA: Diagnosis not present

## 2017-11-03 DIAGNOSIS — M6281 Muscle weakness (generalized): Secondary | ICD-10-CM | POA: Diagnosis not present

## 2017-11-03 LAB — LIPID PANEL
CHOLESTEROL: 153 mg/dL (ref 0–200)
HDL: 83 mg/dL (ref >40)
LDL Cholesterol: 59 mg/dL (ref 0–99)
TRIGLYCERIDES: 53 mg/dL (ref <150)
Total CHOL/HDL Ratio: 1.8 RATIO
VLDL: 11 mg/dL (ref 0–40)

## 2017-11-03 LAB — COMPREHENSIVE METABOLIC PANEL
ALK PHOS: 76 U/L (ref 38–126)
ALT: 13 U/L (ref 0–44)
AST: 22 U/L (ref 15–41)
Albumin: 3.5 g/dL (ref 3.5–5.0)
Anion gap: 7 (ref 5–15)
BILIRUBIN TOTAL: 0.5 mg/dL (ref 0.3–1.2)
BUN: 29 mg/dL — AB (ref 8–23)
CALCIUM: 8.7 mg/dL — AB (ref 8.9–10.3)
CO2: 30 mmol/L (ref 22–32)
CREATININE: 1.07 mg/dL — AB (ref 0.44–1.00)
Chloride: 103 mmol/L (ref 98–111)
GFR calc Af Amer: 51 mL/min — ABNORMAL LOW (ref >60)
GFR, EST NON AFRICAN AMERICAN: 44 mL/min — AB (ref >60)
GLUCOSE: 87 mg/dL (ref 70–99)
Potassium: 3.9 mmol/L (ref 3.5–5.1)
Sodium: 140 mmol/L (ref 135–145)
TOTAL PROTEIN: 5.6 g/dL — AB (ref 6.5–8.1)

## 2017-11-03 LAB — TSH: TSH: 5.829 u[IU]/mL — ABNORMAL HIGH (ref 0.350–4.500)

## 2017-11-03 LAB — MAGNESIUM: Magnesium: 2.2 mg/dL (ref 1.7–2.4)

## 2017-11-03 LAB — VITAMIN B12: Vitamin B-12: 703 pg/mL (ref 180–914)

## 2017-11-04 DIAGNOSIS — M6281 Muscle weakness (generalized): Secondary | ICD-10-CM | POA: Diagnosis not present

## 2017-11-04 DIAGNOSIS — R279 Unspecified lack of coordination: Secondary | ICD-10-CM | POA: Diagnosis not present

## 2017-11-04 DIAGNOSIS — I69354 Hemiplegia and hemiparesis following cerebral infarction affecting left non-dominant side: Secondary | ICD-10-CM | POA: Diagnosis not present

## 2017-11-04 LAB — VITAMIN D 25 HYDROXY (VIT D DEFICIENCY, FRACTURES): VIT D 25 HYDROXY: 47.3 ng/mL (ref 30.0–100.0)

## 2017-11-05 DIAGNOSIS — I69354 Hemiplegia and hemiparesis following cerebral infarction affecting left non-dominant side: Secondary | ICD-10-CM | POA: Diagnosis not present

## 2017-11-05 DIAGNOSIS — R279 Unspecified lack of coordination: Secondary | ICD-10-CM | POA: Diagnosis not present

## 2017-11-05 DIAGNOSIS — M6281 Muscle weakness (generalized): Secondary | ICD-10-CM | POA: Diagnosis not present

## 2017-11-06 DIAGNOSIS — I69354 Hemiplegia and hemiparesis following cerebral infarction affecting left non-dominant side: Secondary | ICD-10-CM | POA: Diagnosis not present

## 2017-11-06 DIAGNOSIS — R279 Unspecified lack of coordination: Secondary | ICD-10-CM | POA: Diagnosis not present

## 2017-11-06 DIAGNOSIS — M6281 Muscle weakness (generalized): Secondary | ICD-10-CM | POA: Diagnosis not present

## 2017-11-09 NOTE — Progress Notes (Signed)
Opened in error; Disregard.

## 2017-11-10 DIAGNOSIS — M6281 Muscle weakness (generalized): Secondary | ICD-10-CM | POA: Diagnosis not present

## 2017-11-10 DIAGNOSIS — R279 Unspecified lack of coordination: Secondary | ICD-10-CM | POA: Diagnosis not present

## 2017-11-10 DIAGNOSIS — I69354 Hemiplegia and hemiparesis following cerebral infarction affecting left non-dominant side: Secondary | ICD-10-CM | POA: Diagnosis not present

## 2017-11-11 DIAGNOSIS — M6281 Muscle weakness (generalized): Secondary | ICD-10-CM | POA: Diagnosis not present

## 2017-11-11 DIAGNOSIS — I69354 Hemiplegia and hemiparesis following cerebral infarction affecting left non-dominant side: Secondary | ICD-10-CM | POA: Diagnosis not present

## 2017-11-11 DIAGNOSIS — R279 Unspecified lack of coordination: Secondary | ICD-10-CM | POA: Diagnosis not present

## 2017-11-12 ENCOUNTER — Encounter: Payer: Self-pay | Admitting: Adult Health

## 2017-11-12 ENCOUNTER — Non-Acute Institutional Stay (SKILLED_NURSING_FACILITY): Payer: Medicare Other | Admitting: Adult Health

## 2017-11-12 DIAGNOSIS — I10 Essential (primary) hypertension: Secondary | ICD-10-CM | POA: Diagnosis not present

## 2017-11-12 DIAGNOSIS — G8929 Other chronic pain: Secondary | ICD-10-CM | POA: Diagnosis not present

## 2017-11-12 DIAGNOSIS — N183 Chronic kidney disease, stage 3 unspecified: Secondary | ICD-10-CM

## 2017-11-12 DIAGNOSIS — M6281 Muscle weakness (generalized): Secondary | ICD-10-CM | POA: Diagnosis not present

## 2017-11-12 DIAGNOSIS — F325 Major depressive disorder, single episode, in full remission: Secondary | ICD-10-CM | POA: Diagnosis not present

## 2017-11-12 DIAGNOSIS — I480 Paroxysmal atrial fibrillation: Secondary | ICD-10-CM

## 2017-11-12 DIAGNOSIS — J449 Chronic obstructive pulmonary disease, unspecified: Secondary | ICD-10-CM

## 2017-11-12 DIAGNOSIS — D649 Anemia, unspecified: Secondary | ICD-10-CM

## 2017-11-12 DIAGNOSIS — R279 Unspecified lack of coordination: Secondary | ICD-10-CM | POA: Diagnosis not present

## 2017-11-12 DIAGNOSIS — I69354 Hemiplegia and hemiparesis following cerebral infarction affecting left non-dominant side: Secondary | ICD-10-CM | POA: Diagnosis not present

## 2017-11-12 NOTE — Progress Notes (Signed)
Location:  [facility] Room Number: [place] of Service:  SNF (31) Provider:  Durenda Age, NP  Patient Care Team: Juluis Pitch, MD as PCP - General [facility] Medicine)  Extended Emergency Contact Information Primary Emergency Contact: [name] Address: [address]          [place], [place] [address] [name] of [place] Phone: [phone] Relation: Daughter Secondary Emergency Contact: Lingerfelt,Brad Address: [address] [place], [place] [address] [name] of [place] Phone: [phone] Relation: [name]  Code Status:  DNR  Goals of care: Advanced Directive information Advanced Directives 11/12/2017  Does Patient Have a Medical Advance Directive? Yes  Type of Advance Directive Out of facility DNR (pink MOST or yellow form)  Does patient want to make changes to medical advance directive? No - Patient declined  Would patient like information on creating a medical advance directive? -  Pre-existing out of facility DNR order (yellow form or pink MOST form) -     Chief Complaint  Patient presents with  . Medical Management of Chronic Issues    The patient is seen for a routine [facility] routine SNF visit    HPI:  Pt is a 84 y.o. female seen today for medical management of chronic diseases.  She is a long-term care resident of [facility].  She has a PMH of COPD, CVA with hemiparesis/hemiplegia, PAF, and CKD stage 3.   Past Medical History:  Diagnosis Date  . Adenoma of rectum   . Adrenal adenoma   . Anemia   . Anemia, unspecified   . Arthritis   . Ascending aortic aneurysm ([place])    a. s/p repair 07/2014  . Atrophic vaginitis   . Benign neoplasm of colon, unspecified   . COPD (chronic obstructive pulmonary disease) ([place])   . Coronary artery disease, non-occlusive    a. LHC 06/2014 without evidence of obstructive disease  . Depression   . Diverticulosis   . Dysphagia     . Dysrhythmia   . Essential hypertension    benign  . Frequent UTI   . GERD (gastroesophageal reflux disease)   . Hemiplegia and hemiparesis ([place])    following cerebral infarction affecting left non-dominant side  . Hypertension   . Microscopic hematuria   . Nonrheumatic aortic valve insufficiency   . Osteoporosis    unspecified  . Panic attacks   . Paroxysmal atrial fibrillation (HCC)   . Pernicious anemia   . Renal cyst    CKD STAGE 3  . Stroke ([place]) 07/23/2014   a. post-operative setting in 07/2014 leading to left UE hemiapresis and left LE weakness  . Urge incontinence    Past Surgical History:  Procedure Laterality Date  . ABDOMINAL HYSTERECTOMY  1972  . AORTOILIAC BYPASS    . ASCENDING AORTIC ANEURYSM REPAIR  07/23/2014   07/2014  . BLEPHAROPLASTY    . CATARACT EXTRACTION  2005  . GASTROSTOMY W/ FEEDING TUBE    . INTRAMEDULLARY (IM) NAIL INTERTROCHANTERIC Left 02/26/2016   Procedure: INTRAMEDULLARY (IM) NAIL INTERTROCHANTRIC;  Surgeon: Earnestine Leys, MD;  Location: [facility] ORS;  Service: Orthopedics;  Laterality: Left;  . KYPHOPLASTY N/A 08/26/2016   Procedure: KYPHOPLASTY T12;  Surgeon: Hessie Knows, MD;  Location: [facility] ORS;  Service: Orthopedics;  Laterality: N/A;  . LEFT HEART CATH  06/2014  . PR ASCEND AORTIC GRAFT INCL VALVE SUSPENSION  07/07/2014   Procedure: ASCENDING  AORTA GRAFT, WITH CARDIOPULMONARY BYPASS, WITH OR WITHOUT VALVE SUSPENSION; Surgeon: Dwaine Deter, MD; Location: MAIN OR [facility]; Service: Cardiothoracic  . PR LAP, GASTROTOMY W/O TUBE CONSTR  08/08/2014   Procedure: LAPAROSCOPY, SURGICAL; GASTOSTOMY W/O CONSTRUCTION OF GASTRIC TUBE (EG, STAMM PROCEDURE)(SEPARATE PROCED); Surgeon: Fredrik Rigger, MD; Location: MAIN OR [facility]; Service: Gastrointestinal  . PR PLACE PERCUT GASTROSTOMY TUBE  08/08/2014   Procedure: UGI ENDO; W/DIRECTED PLCMT PERQ GASTROSTOMY TUBE; Surgeon: Fredrik Rigger, MD; Location: MAIN OR [facility]; Service: Gastrointestinal     Allergies  Allergen Reactions  . Iodinated Diagnostic Agents Itching and Swelling    Other reaction(s): Unknown  . Nitrofurantoin Diarrhea    Other reaction(s): Unknown  . Omnipaque [Iohexol]   . Solifenacin Other (See Comments)    indigestion  . Ciprofloxacin Other (See Comments) and Rash    Colitis  . Metronidazole Itching and Rash  . Sulfa Antibiotics Itching and Rash    Outpatient Encounter Medications as of 11/12/2017  Medication Sig  . acetaminophen (TYLENOL) 325 MG tablet Take 650 mg by mouth 4 (four) times daily.   Marland Kitchen albuterol (PROVENTIL HFA;VENTOLIN HFA) 108 (90 Base) MCG/ACT inhaler Inhale 2 puffs into the lungs every 4 (four) hours as needed for wheezing or shortness of breath.  Marland Kitchen alum & mag hydroxide-simeth (MAALOX PLUS) 400-400-40 MG/5ML suspension Take 30 mLs by mouth every 4 (four) hours as needed.  Marland Kitchen apixaban (ELIQUIS) 2.5 MG TABS tablet Take 2.5 mg by mouth 2 (two) times daily.  Marland Kitchen atorvastatin (LIPITOR) 20 MG tablet Take 20 mg by mouth at bedtime.   Marland Kitchen BREO ELLIPTA 100-25 MCG/INH AEPB Inhale 1 puff into the lungs daily. Rinse mouth well after use  . carboxymethylcellulose (REFRESH TEARS) 0.5 % SOLN Place 2 drops into both eyes See admin instructions. 1 to 4 times daily prn  . Cholecalciferol 4000 units CAPS Take 1 capsule by mouth daily.  Marland Kitchen dextromethorphan-guaiFENesin (MUCINEX DM) 30-600 MG 12hr tablet Take 1 tablet by mouth 2 (two) times daily.  . diclofenac sodium (VOLTAREN) 1 % GEL Apply 4 g topically 4 (four) times daily as needed (pain). To lumbar spine  . diltiazem (CARDIZEM) 60 MG tablet Take 60 mg by mouth daily.  Marland Kitchen docusate sodium (COLACE) 100 MG capsule Take 1 capsule (100 mg total) by mouth 2 (two) times daily.  . ferrous sulfate 325 (65 FE) MG tablet Take 1 tablet (325 mg total) by mouth 2 (two) times daily with a meal.  . fluticasone (FLONASE) 50 MCG/ACT nasal spray Place 1 spray into both nostrils daily.   Marland Kitchen gabapentin (NEURONTIN) 100 MG capsule Take  200 mg by mouth 3 (three) times daily. 2 caps  . guaiFENesin-codeine (ROBITUSSIN AC) 100-10 MG/5ML syrup Take 5 mLs by mouth every 6 (six) hours as needed for cough.  Marland Kitchen ipratropium-albuterol (DUONEB) 0.5-2.5 (3) MG/3ML SOLN Take 3 mLs by nebulization 3 (three) times daily.  Marland Kitchen lanolin/mineral oil (KERI/THERA-DERM) LOTN Apply liberal amount daily topically to arms and legs after the bath and PRN for skin integrity  . Lidocaine (ASPERCREME LIDOCAINE) 4 % PTCH Apply 1 patch topically daily. Apply one patch to Right Flank/sore ribs and one patch to the right forearm daily. Remove after 12 hours  . magnesium hydroxide (MILK OF MAGNESIA) 400 MG/5ML suspension Take 30 mLs by mouth daily as needed for mild constipation.  . methocarbamol (ROBAXIN) 500 MG tablet Take 250 mg by mouth 2 (two) times daily. 1/2 tablet  . metoCLOPramide (REGLAN) 5 MG tablet Take 2.5 mg by mouth  2 (two) times daily. 1/2 tab  . montelukast (SINGULAIR) 10 MG tablet Take 10 mg by mouth at bedtime.  Marland Kitchen oxycodone (OXY-IR) 5 MG capsule Take 5 mg by mouth every 4 (four) hours as needed. For moderate or severe pain  . OXYGEN Inhale 2 L into the lungs continuous as needed.  . pantoprazole (PROTONIX) 40 MG tablet Take 40 mg by mouth daily.  Vladimir Faster Glycol-Propyl Glycol (SYSTANE) 0.4-0.3 % GEL ophthalmic gel Place 1 application into both eyes at bedtime. (2 drops ) for dry eyes (Ointments are on Backorder)  . polyethylene glycol (MIRALAX) packet Take 17 g by mouth 2 (two) times daily.  . potassium chloride SA (K-DUR,KLOR-CON) 20 MEQ tablet Take 20 mEq by mouth daily.   . predniSONE (DELTASONE) 10 MG tablet Take 10 mg by mouth daily with breakfast.  . ranitidine (ZANTAC) 75 MG tablet Take 75 mg by mouth 2 (two) times daily.   Marland Kitchen senna-docusate (SENNA-PLUS) 8.6-50 MG tablet Take 2 tablets by mouth 2 (two) times daily as needed.   . sertraline (ZOLOFT) 100 MG tablet Take 100 mg by mouth daily. 8 pm  . sertraline (ZOLOFT) 50 MG tablet Take 50  mg by mouth at bedtime. 8 pm  . sodium phosphate Pediatric (FLEET) 3.5-9.5 GM/59ML enema Place 1 enema rectally once as needed for severe constipation. Take once every 3 days if constipation is not relieved by Milk of magnesia or Bisacodyl Suppository  . torsemide (DEMADEX) 10 MG tablet Take 15 mg by mouth daily. 1 and 1/2 tab  . vitamin B-12 (CYANOCOBALAMIN) 250 MCG tablet Take 250 mcg by mouth daily.   No facility-administered encounter medications on file as of 11/12/2017.     Review of Systems  GENERAL: No change in appetite, no fatigue, no weight changes, no fever, chills or weakness MOUTH and THROAT: Denies oral discomfort, gingival pain or bleeding RESPIRATORY: No cough, SOB, DOE, wheezing, hemoptysis CARDIAC: No chest pain, edema or palpitations GI: No abdominal pain, diarrhea, constipation, heart burn, nausea or vomiting GU: Denies dysuria, frequency, hematuria, incontinence, or discharge PSYCHIATRIC: Denies feelings of depression or anxiety. No report of hallucinations, insomnia, paranoia, or agitation   Immunization History  Administered Date(s) Administered  . Influenza Inj Mdck Quad Pf 02/14/2016  . Influenza-Unspecified 02/02/2014, 01/27/2017  . PPD Test 08/10/2014, 08/24/2014, 02/20/2016  . Pneumococcal-Unspecified 02/20/2016   Pertinent  Health Maintenance Due  Topic Date Due  . PNA vac Low Risk Adult (2 of 2 - PCV13) 02/19/2017  . INFLUENZA VACCINE  12/03/2017  . DEXA SCAN  Discontinued      Vitals:   11/12/17 1141  BP: (!) 139/50  Pulse: 66  Resp: 16  Temp: (!) 97.2 F (36.2 C)  TempSrc: Oral  SpO2: 94%  Weight: 129 lb 14.4 oz (58.9 kg)  Height: 5\' 1"  (1.549 m)   Body mass index is 24.54 kg/m.  Physical Exam  GENERAL APPEARANCE: Well nourished. In no acute distress. Normal body habitus MOUTH and THROAT: Lips are without lesions. Oral mucosa is moist and without lesions.  RESPIRATORY: Breathing is even & unlabored, BS CTAB CARDIAC: RRR, no  murmur,no extra heart sounds, no edema GI: Abdomen soft, normal BS, no masses, no tenderness EXTREMITIES:  No cyanosis  PSYCHIATRIC:  Affect and behavior are appropriate  Labs reviewed: Recent Labs    06/02/17 0640 06/09/17 0700 07/30/17 0430 11/03/17 0640  NA 136 139 141 140  K 5.4* 3.8 4.1 3.9  CL 110 105 105 103  CO2 21* 25  27 30  GLUCOSE 85 76 86 87  BUN 21* 28* 23* 29*  CREATININE 1.25* 1.18* 1.18* 1.07*  CALCIUM 8.8* 8.4* 8.6* 8.7*  MG 1.7  --   --  2.2   Recent Labs    05/29/17 0500 06/02/17 0640 11/03/17 0640  AST 13* 14* 22  ALT 8* 9* 13  ALKPHOS 69 71 76  BILITOT 0.5 0.5 0.5  PROT 5.4* 5.7* 5.6*  ALBUMIN 3.3* 3.5 3.5   Recent Labs    05/29/17 0500 06/02/17 0640  WBC 9.3 10.6  NEUTROABS 6.9* 8.1*  HGB 9.7* 10.6*  HCT 29.3* 31.7*  MCV 90.0 90.4  PLT 229 241   Lab Results  Component Value Date   TSH 5.829 (H) 11/03/2017   Lab Results  Component Value Date   HGBA1C 5.9 (H) 02/25/2016   Lab Results  Component Value Date   CHOL 153 11/03/2017   HDL 83 11/03/2017   LDLCALC 59 11/03/2017   TRIG 53 11/03/2017   CHOLHDL 1.8 11/03/2017    Assessment/Plan  1. Chronic obstructive pulmonary disease, unspecified COPD type (HCC) - no wheezing, continue Prednisone 10 mg 1 tab daily, Mucinex DM 12 HR 30-600 mg 1 tab Q 12 hours, Montelukast 10 mg Q HS   2. Benign essential HTN - well-controlled, continue Torsemide 10 mg give 1 1/2 tab = 15 mg daily, and Cardizem 60 mg 1 tab daily   3. Paroxysmal atrial fibrillation (HCC) - rate-controlled, continue Eliquis 2.5 mg 1 tab BID and Cardizem 60 mg 1 tab daily   4. Anemia, unspecified type -  Stable, continue FeSO4 325 mg 1 tab BID Lab Results  Component Value Date   HGB 10.6 (L) 06/02/2017     5. Chronic kidney disease, stage 3 (HCC) - stable Lab Results  Component Value Date   CREATININE 1.07 (H) 11/03/2017     6. 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 Other chronic pain - well-controlled, continue Gabapentin 100 mg 2 capsules = 200 mg TID    Family/ staff Communication:  Discussed plan of care with patient.  Labs/tests ordered:  None  Goals of care:   Long-term care.  Durenda Age, NP [facility] [phone] (Monday-Friday 8:00 a.m. - 5:00 p.m.) [phone] (after hours)

## 2017-11-16 DIAGNOSIS — I69354 Hemiplegia and hemiparesis following cerebral infarction affecting left non-dominant side: Secondary | ICD-10-CM | POA: Diagnosis not present

## 2017-11-16 DIAGNOSIS — M6281 Muscle weakness (generalized): Secondary | ICD-10-CM | POA: Diagnosis not present

## 2017-11-16 DIAGNOSIS — R279 Unspecified lack of coordination: Secondary | ICD-10-CM | POA: Diagnosis not present

## 2017-11-17 DIAGNOSIS — M6281 Muscle weakness (generalized): Secondary | ICD-10-CM | POA: Diagnosis not present

## 2017-11-17 DIAGNOSIS — R279 Unspecified lack of coordination: Secondary | ICD-10-CM | POA: Diagnosis not present

## 2017-11-17 DIAGNOSIS — I69354 Hemiplegia and hemiparesis following cerebral infarction affecting left non-dominant side: Secondary | ICD-10-CM | POA: Diagnosis not present

## 2017-11-18 DIAGNOSIS — R279 Unspecified lack of coordination: Secondary | ICD-10-CM | POA: Diagnosis not present

## 2017-11-18 DIAGNOSIS — I69354 Hemiplegia and hemiparesis following cerebral infarction affecting left non-dominant side: Secondary | ICD-10-CM | POA: Diagnosis not present

## 2017-11-18 DIAGNOSIS — M6281 Muscle weakness (generalized): Secondary | ICD-10-CM | POA: Diagnosis not present

## 2017-11-20 DIAGNOSIS — M6281 Muscle weakness (generalized): Secondary | ICD-10-CM | POA: Diagnosis not present

## 2017-11-20 DIAGNOSIS — R279 Unspecified lack of coordination: Secondary | ICD-10-CM | POA: Diagnosis not present

## 2017-11-20 DIAGNOSIS — I69354 Hemiplegia and hemiparesis following cerebral infarction affecting left non-dominant side: Secondary | ICD-10-CM | POA: Diagnosis not present

## 2017-11-23 ENCOUNTER — Other Ambulatory Visit: Payer: Self-pay | Admitting: *Deleted

## 2017-11-23 DIAGNOSIS — R279 Unspecified lack of coordination: Secondary | ICD-10-CM | POA: Diagnosis not present

## 2017-11-23 DIAGNOSIS — I69354 Hemiplegia and hemiparesis following cerebral infarction affecting left non-dominant side: Secondary | ICD-10-CM | POA: Diagnosis not present

## 2017-11-23 DIAGNOSIS — M6281 Muscle weakness (generalized): Secondary | ICD-10-CM | POA: Diagnosis not present

## 2017-11-23 MED ORDER — GUAIFENESIN-CODEINE 100-10 MG/5ML PO SYRP: 5.0000 mL | ORAL_SOLUTION | Freq: Four times a day (QID) | ORAL | 0 refills | Status: DC | PRN

## 2017-11-23 NOTE — Telephone Encounter (Signed)
[place]

## 2017-11-25 DIAGNOSIS — M6281 Muscle weakness (generalized): Secondary | ICD-10-CM | POA: Diagnosis not present

## 2017-11-25 DIAGNOSIS — I69354 Hemiplegia and hemiparesis following cerebral infarction affecting left non-dominant side: Secondary | ICD-10-CM | POA: Diagnosis not present

## 2017-11-25 DIAGNOSIS — R279 Unspecified lack of coordination: Secondary | ICD-10-CM | POA: Diagnosis not present

## 2017-11-26 DIAGNOSIS — R279 Unspecified lack of coordination: Secondary | ICD-10-CM | POA: Diagnosis not present

## 2017-11-26 DIAGNOSIS — I69354 Hemiplegia and hemiparesis following cerebral infarction affecting left non-dominant side: Secondary | ICD-10-CM | POA: Diagnosis not present

## 2017-11-26 DIAGNOSIS — M6281 Muscle weakness (generalized): Secondary | ICD-10-CM | POA: Diagnosis not present

## 2017-11-30 DIAGNOSIS — R279 Unspecified lack of coordination: Secondary | ICD-10-CM | POA: Diagnosis not present

## 2017-11-30 DIAGNOSIS — M6281 Muscle weakness (generalized): Secondary | ICD-10-CM | POA: Diagnosis not present

## 2017-11-30 DIAGNOSIS — I69354 Hemiplegia and hemiparesis following cerebral infarction affecting left non-dominant side: Secondary | ICD-10-CM | POA: Diagnosis not present

## 2017-12-02 DIAGNOSIS — M6281 Muscle weakness (generalized): Secondary | ICD-10-CM | POA: Diagnosis not present

## 2017-12-02 DIAGNOSIS — I69354 Hemiplegia and hemiparesis following cerebral infarction affecting left non-dominant side: Secondary | ICD-10-CM | POA: Diagnosis not present

## 2017-12-02 DIAGNOSIS — R279 Unspecified lack of coordination: Secondary | ICD-10-CM | POA: Diagnosis not present

## 2017-12-03 ENCOUNTER — Encounter
Admission: RE | Admit: 2017-12-03 | Discharge: 2017-12-03 | Disposition: A | Discharge status: Home / Self Care | Payer: Medicare Other | Source: Ambulatory Visit | Attending: Internal Medicine | Admitting: Internal Medicine

## 2017-12-15 ENCOUNTER — Non-Acute Institutional Stay (SKILLED_NURSING_FACILITY): Payer: Medicare Other | Admitting: Adult Health

## 2017-12-15 ENCOUNTER — Encounter: Payer: Self-pay | Admitting: Adult Health

## 2017-12-15 DIAGNOSIS — K219 Gastro-esophageal reflux disease without esophagitis: Secondary | ICD-10-CM | POA: Diagnosis not present

## 2017-12-15 DIAGNOSIS — I10 Essential (primary) hypertension: Secondary | ICD-10-CM

## 2017-12-15 DIAGNOSIS — J309 Allergic rhinitis, unspecified: Secondary | ICD-10-CM

## 2017-12-15 DIAGNOSIS — D510 Vitamin B12 deficiency anemia due to intrinsic factor deficiency: Secondary | ICD-10-CM

## 2017-12-15 DIAGNOSIS — I69354 Hemiplegia and hemiparesis following cerebral infarction affecting left non-dominant side: Secondary | ICD-10-CM | POA: Diagnosis not present

## 2017-12-15 DIAGNOSIS — Z8673 Personal history of transient ischemic attack (TIA), and cerebral infarction without residual deficits: Secondary | ICD-10-CM | POA: Diagnosis not present

## 2017-12-15 DIAGNOSIS — F325 Major depressive disorder, single episode, in full remission: Secondary | ICD-10-CM

## 2017-12-15 DIAGNOSIS — I480 Paroxysmal atrial fibrillation: Secondary | ICD-10-CM

## 2017-12-15 NOTE — Progress Notes (Signed)
Location:  [facility] Number: [place] of Service:  SNF [phone]) Provider:  Durenda Age, NP  Patient Care Team: Juluis Pitch, MD as PCP - General [facility])  Extended Emergency Contact Information Primary Emergency Contact: [name] Address: [place], [place] [address] [name] of [place] Phone: [phone] Relation: Daughter Secondary Emergency Contact: Lingerfelt,Brad Address: [address] [place],  [address] [name] of [place] Phone: [phone] Relation: [name]  Code Status:  DNR  Goals of care: Advanced Directive information Advanced Directives 12/15/2017  Does Patient Have a Medical Advance Directive? No  Type of Advance Directive Out of facility DNR (pink MOST or yellow form)  Does patient want to make changes to medical advance directive? No - Patient declined  Would patient like information on creating a medical advance directive? -  Pre-existing out of facility DNR order (yellow form or pink MOST form) Yellow form placed in chart (order not valid for inpatient use)     Chief Complaint  Patient presents with  . Medical Management of Chronic Issues    Routine Visit    HPI:  Pt is a 84 y.o. female seen today for medical management of chronic diseases.  She is a long-term care resident of [place].  She has a PMH of COPD, major depression, CKD stage II, and chronic pain.    Past Medical History:  Diagnosis Date  . Adenoma of rectum   . Adrenal adenoma   . Anemia   . Anemia, unspecified   . Arthritis   . Ascending aortic aneurysm ([place])    a. s/p repair 07/2014  . Atrophic vaginitis   . Benign neoplasm of colon, unspecified   . COPD (chronic obstructive pulmonary disease) ([place])   . Coronary artery disease, non-occlusive    a. LHC 06/2014 without evidence of obstructive disease  . Depression   . Diverticulosis   . Dysphagia   . Dysrhythmia    . Essential hypertension    benign  . Frequent UTI   . GERD (gastroesophageal reflux disease)   . Hemiplegia and hemiparesis ([place])    following cerebral infarction affecting left non-dominant side  . Hypertension   . Microscopic hematuria   . Nonrheumatic aortic valve insufficiency   . Osteoporosis    unspecified  . Panic attacks   . Paroxysmal atrial fibrillation (HCC)   . Pernicious anemia   . Renal cyst    CKD STAGE 3  . Stroke ([place]) 07/23/2014   a. post-operative setting in 07/2014 leading to left UE hemiapresis and left LE weakness  . Urge incontinence    Past Surgical History:  Procedure Laterality Date  . ABDOMINAL HYSTERECTOMY  1972  . AORTOILIAC BYPASS    . ASCENDING AORTIC ANEURYSM REPAIR  07/23/2014   07/2014  . BLEPHAROPLASTY    . CATARACT EXTRACTION  2005  . GASTROSTOMY W/ FEEDING TUBE    . INTRAMEDULLARY (IM) NAIL INTERTROCHANTERIC Left 02/26/2016   Procedure: INTRAMEDULLARY (IM) NAIL INTERTROCHANTRIC;  Surgeon: Earnestine Leys, MD;  Location: [facility] ORS;  Service: Orthopedics;  Laterality: Left;  . KYPHOPLASTY N/A 08/26/2016   Procedure: KYPHOPLASTY T12;  Surgeon: Hessie Knows, MD;  Location: [facility] ORS;  Service: Orthopedics;  Laterality: N/A;  . LEFT HEART CATH  06/2014  . PR ASCEND AORTIC GRAFT INCL VALVE SUSPENSION  07/07/2014   Procedure: ASCENDING AORTA GRAFT, WITH  CARDIOPULMONARY BYPASS, WITH OR WITHOUT VALVE SUSPENSION; Surgeon: Dwaine Deter, MD; Location: MAIN OR [facility]; Service: Cardiothoracic  . PR LAP, GASTROTOMY W/O TUBE CONSTR  08/08/2014   Procedure: LAPAROSCOPY, SURGICAL; GASTOSTOMY W/O CONSTRUCTION OF GASTRIC TUBE (EG, STAMM PROCEDURE)(SEPARATE PROCED); Surgeon: Fredrik Rigger, MD; Location: MAIN OR [facility]; Service: Gastrointestinal  . PR PLACE PERCUT GASTROSTOMY TUBE  08/08/2014   Procedure: UGI ENDO; W/DIRECTED PLCMT PERQ GASTROSTOMY TUBE; Surgeon: Fredrik Rigger, MD; Location[facility]IN [facility]; Service: Gastrointestinal    Allergies   Allergen Reactions  . Iodinated Diagnostic Agents Itching and Swelling    Other reaction(s): Unknown  . Nitrofurantoin Diarrhea    Other reaction(s): Unknown  . Omnipaque [Iohexol]   . Solifenacin Other (See Comments)    indigestion  . Ciprofloxacin Other (See Comments) and Rash    Colitis  . Metronidazole Itching and Rash  . Sulfa Antibiotics Itching and Rash    Outpatient Encounter Medications as of 12/15/2017  Medication Sig  . acetaminophen (TYLENOL) 325 MG tablet Take 650 mg by mouth 4 (four) times daily.   Marland Kitchen albuterol (PROVENTIL HFA;VENTOLIN HFA) 108 (90 Base) MCG/ACT inhaler Inhale 2 puffs into the lungs every 4 (four) hours as needed for wheezing or shortness of breath.  Marland Kitchen alum & mag hydroxide-simeth (MAALOX PLUS) 400-400-40 MG/5ML suspension Take 30 mLs by mouth every 4 (four) hours as needed.  Marland Kitchen apixaban (ELIQUIS) 2.5 MG TABS tablet Take 2.5 mg by mouth 2 (two) times daily.  Marland Kitchen atorvastatin (LIPITOR) 20 MG tablet Take 20 mg by mouth at bedtime.   Marland Kitchen BREO ELLIPTA 100-25 MCG/INH AEPB Inhale 1 puff into the lungs daily. Rinse mouth well after use  . carboxymethylcellulose (REFRESH TEARS) 0.5 % SOLN Place 2 drops into both eyes See admin instructions. 1 to 4 times daily prn  . Cholecalciferol 4000 units CAPS Take 1 capsule by mouth daily.  Marland Kitchen dextromethorphan-guaiFENesin (MUCINEX DM) 30-600 MG 12hr tablet Take 1 tablet by mouth 2 (two) times daily.  . diclofenac sodium (VOLTAREN) 1 % GEL Apply 4 g topically 4 (four) times daily as needed (pain). To lumbar spine  . diltiazem (CARDIZEM) 60 MG tablet Take 60 mg by mouth daily.  Marland Kitchen docusate sodium (COLACE) 100 MG capsule Take 1 capsule (100 mg total) by mouth 2 (two) times daily.  . ferrous sulfate 325 (65 FE) MG tablet Take 1 tablet (325 mg total) by mouth 2 (two) times daily with a meal.  . fluticasone (FLONASE) 50 MCG/ACT nasal spray Place 1 spray into both nostrils daily.   Marland Kitchen gabapentin (NEURONTIN) 100 MG capsule Take 200 mg by mouth  3 (three) times daily. 2 caps  . guaiFENesin-codeine (ROBITUSSIN AC) 100-10 MG/5ML syrup Take 5 mLs by mouth every 6 (six) hours as needed for cough.  Marland Kitchen ipratropium-albuterol (DUONEB) 0.5-2.5 (3) MG/3ML SOLN Take 3 mLs by nebulization 3 (three) times daily.  . Lidocaine (ASPERCREME LIDOCAINE) 4 % PTCH Apply 2 patches topically daily. Apply one patch to Right Flank/sore ribs and one patch to the right forearm daily. Remove after 12 hours  . magnesium hydroxide (MILK OF MAGNESIA) 400 MG/5ML suspension Take 30 mLs by mouth daily as needed for mild constipation.  . methocarbamol (ROBAXIN) 500 MG tablet Take 250 mg by mouth 2 (two) times daily. 1/2 tablet  . metoCLOPramide (REGLAN) 5 MG tablet Take 2.5 mg by mouth 2 (two) times daily. 1/2 tab  . montelukast (SINGULAIR) 10 MG tablet Take 10 mg by mouth at bedtime.  Marland Kitchen oxycodone (OXY-IR) 5 MG  capsule Take 5 mg by mouth every 4 (four) hours as needed. For moderate or severe pain  . OXYGEN Inhale 2 L into the lungs continuous as needed.  . pantoprazole (PROTONIX) 40 MG tablet Take 40 mg by mouth daily.  Vladimir Faster Glycol-Propyl Glycol (SYSTANE) 0.4-0.3 % GEL ophthalmic gel Place 1 application into both eyes at bedtime. (2 drops ) for dry eyes (Ointments are on Backorder)  . polyethylene glycol (MIRALAX) packet Take 17 g by mouth 2 (two) times daily.  . potassium chloride SA (K-DUR,KLOR-CON) 20 MEQ tablet Take 20 mEq by mouth daily.   . predniSONE (DELTASONE) 10 MG tablet Take 10 mg by mouth daily with breakfast.  . ranitidine (ZANTAC) 75 MG tablet Take 75 mg by mouth 2 (two) times daily.   Marland Kitchen senna-docusate (SENNA-PLUS) 8.6-50 MG tablet Take 2 tablets by mouth 2 (two) times daily as needed.   . sertraline (ZOLOFT) 100 MG tablet Take 100 mg by mouth daily. 8 pm  . sertraline (ZOLOFT) 50 MG tablet Take 50 mg by mouth at bedtime. 8 pm  . Skin Protectants, Misc. (MINERIN) CREA Apply liberal amount daily to arms and legs after the bath and PRN for skin integrity   . sodium phosphate Pediatric (FLEET) 3.5-9.5 GM/59ML enema Place 1 enema rectally once as needed for severe constipation. Take once every 3 days if constipation is not relieved by Milk of magnesia or Bisacodyl Suppository  . torsemide (DEMADEX) 10 MG tablet Take 15 mg by mouth daily. 1 and 1/2 tab  . UNABLE TO FIND Diet Type: NAS  . vitamin B-12 (CYANOCOBALAMIN) 250 MCG tablet Take 250 mcg by mouth daily.  . [DISCONTINUED] lanolin/mineral oil (KERI/THERA-DERM) LOTN Apply liberal amount daily topically to arms and legs after the bath and PRN for skin integrity   No facility-administered encounter medications on file as of 12/15/2017.     Review of Systems  GENERAL: No change in appetite, no fatigue, no weight changes, no fever, chills or weakness MOUTH and THROAT: Denies oral discomfort, gingival pain or bleeding RESPIRATORY: no cough, SOB, DOE, wheezing, hemoptysis CARDIAC: No chest pain, edema or palpitations GI: No abdominal pain, diarrhea, constipation, heart burn, nausea or vomiting GU: Denies dysuria, frequency, hematuria, incontinence, or discharge PSYCHIATRIC: Denies feelings of depression or anxiety. No report of hallucinations, insomnia, paranoia, or agitation   Immunization History  Administered Date(s) Administered  . Influenza Inj Mdck Quad Pf 02/14/2016  . Influenza-Unspecified 02/02/2014, 01/27/2017  . PPD Test 08/10/2014, 08/24/2014, 02/20/2016  . Pneumococcal-Unspecified 02/20/2016   Pertinent  Health Maintenance Due  Topic Date Due  . INFLUENZA VACCINE  12/03/2017  . PNA vac Low Risk Adult (2 of 2 - PCV13) 01/16/2019 (Originally 02/19/2017)  . DEXA SCAN  Discontinued      Vitals:   12/15/17 1446  BP: 130/72  Pulse: 82  Resp: 20  Temp: 98.1 F (36.7 C)  TempSrc: Oral  SpO2: 95%  Weight: 133 lb 12.8 oz (60.7 kg)  Height: 5\' 1"  (1.549 m)   Body mass index is 25.28 kg/m.  Physical Exam  GENERAL APPEARANCE: Well nourished. In no acute distress. Normal  body habitus SKIN:  Skin is warm and dry.  MOUTH and THROAT: Lips are without lesions. Oral mucosa is moist and without lesions. Tongue is normal in shape, size, and color and without lesions RESPIRATORY: Breathing is even & unlabored, BS CTAB CARDIAC: RRR, no murmur,no extra heart sounds, no edema GI: Abdomen soft, normal BS, no masses, no tenderness EXTREMITIES:  LUE  with splint which does not move, LLE weakness PSYCHIATRIC: Alert and oriented X 3. Affect and behavior are appropriate  Labs reviewed: Recent Labs    06/02/17 0640 06/09/17 0700 07/30/17 0430 11/03/17 0640  NA 136 139 141 140  K 5.4* 3.8 4.1 3.9  CL 110 105 105 103  CO2 21* 25 27 30   GLUCOSE 85 76 86 87  BUN 21* 28* 23* 29*  CREATININE 1.25* 1.18* 1.18* 1.07*  CALCIUM 8.8* 8.4* 8.6* 8.7*  MG 1.7  --   --  2.2   Recent Labs    05/29/17 0500 06/02/17 0640 11/03/17 0640  AST 13* 14* 22  ALT 8* 9* 13  ALKPHOS 69 71 76  BILITOT 0.5 0.5 0.5  PROT 5.4* 5.7* 5.6*  ALBUMIN 3.3* 3.5 3.5   Recent Labs    05/29/17 0500 06/02/17 0640  WBC 9.3 10.6  NEUTROABS 6.9* 8.1*  HGB 9.7* 10.6*  HCT 29.3* 31.7*  MCV 90.0 90.4  PLT 229 241   Lab Results  Component Value Date   TSH 5.829 (H) 11/03/2017   Lab Results  Component Value Date   HGBA1C 5.9 (H) 02/25/2016   Lab Results  Component Value Date   CHOL 153 11/03/2017   HDL 83 11/03/2017   LDLCALC 59 11/03/2017   TRIG 53 11/03/2017   CHOLHDL 1.8 11/03/2017    Assessment/Plan  1. Benign essential HTN - well-controlled, continue Torsemide 10 mg  1/1/2 tab = 15 mg daily, Cardizem 60 mg 1 tab daily   2. Gastroesophageal reflux disease without esophagitis - stable, continue Ranitidine 75 mg BID, Protonix 40 mg daily   3. History of stroke - stable, continue Eliquis 2.5 mg BID, Torsemide 10 mg  1/1/2 tab = 15 mg daily, Cardizem 60 mg 1 tab daily, and Atorvastatin 20 mg 1 tab Q HS    4. Paroxysmal atrial fibrillation (HCC)  - rate-controlled, continue  Cardizem 60 mg 1 tab daily and  Eliquis 2.5 mg BID   5. Hemiplegia and hemiparesis following cerebral infarction affecting left non-dominant side (HCC) - cannot move LUE, using splint, continue supportive care, fall precautions, continue Robaxin 500 mg 1/2 tab = 250 mg Q AM and HS   6. Major depressive disorder, single episode in full remission ([place]) - mood is stable, continue Sertraline 50 mg 1 tab daily and 100 mg Q HS   7. Allergic rhinitis, unspecified seasonality, unspecified trigger - stable, continue Montelukast 10 mg 1 tab Q HS and Fluticasone 50 mcg instill 1 spray into each nostril daily   8. Vitamin B12 deficiency anemia due to intrinsic factor deficiency - continue Vitamin B12 250 mcg 1 tab daily Lab Results  Component Value Date   VITAMINB12 703 11/03/2017      Family/ staff Communication:  Discussed plan of care with resident.  Labs/tests ordered:  None  Goals of care:   Long-term care.   Durenda Age, NP [facility] [phone] (Monday-Friday 8:00 a.m. - 5:00 p.m.) [phone] (after hours)

## 2017-12-23 ENCOUNTER — Other Ambulatory Visit
Admission: RE | Admit: 2017-12-23 | Discharge: 2017-12-23 | Disposition: A | Discharge status: Home / Self Care | Payer: Medicare Other | Source: Ambulatory Visit | Attending: Internal Medicine | Admitting: Internal Medicine

## 2017-12-23 DIAGNOSIS — Z7901 Long term (current) use of anticoagulants: Secondary | ICD-10-CM | POA: Diagnosis not present

## 2017-12-23 LAB — CBC WITH DIFFERENTIAL/PLATELET
BASOS ABS: 0 10*3/uL (ref 0–0.1)
BASOS PCT: 1 %
Eosinophils Absolute: 0.1 10*3/uL (ref 0–0.7)
Eosinophils Relative: 2 %
HEMATOCRIT: 31.4 % — AB (ref 35.0–47.0)
Hemoglobin: 10.9 g/dL — ABNORMAL LOW (ref 12.0–16.0)
Lymphocytes Relative: 33 %
Lymphs Abs: 2.1 10*3/uL (ref 1.0–3.6)
MCH: 31.8 pg (ref 26.0–34.0)
MCHC: 34.7 g/dL (ref 32.0–36.0)
MCV: 91.8 fL (ref 80.0–100.0)
MONO ABS: 0.5 10*3/uL (ref 0.2–0.9)
Monocytes Relative: 7 %
NEUTROS ABS: 3.6 10*3/uL (ref 1.4–6.5)
NEUTROS PCT: 57 %
Platelets: 169 10*3/uL (ref 150–440)
RBC: 3.42 MIL/uL — ABNORMAL LOW (ref 3.80–5.20)
RDW: 15.7 % — AB (ref 11.5–14.5)
WBC: 6.4 10*3/uL (ref 3.6–11.0)

## 2018-01-01 DIAGNOSIS — F325 Major depressive disorder, single episode, in full remission: Secondary | ICD-10-CM | POA: Diagnosis not present

## 2018-01-01 DIAGNOSIS — I482 Chronic atrial fibrillation: Secondary | ICD-10-CM | POA: Diagnosis not present

## 2018-01-01 DIAGNOSIS — J441 Chronic obstructive pulmonary disease with (acute) exacerbation: Secondary | ICD-10-CM | POA: Diagnosis not present

## 2018-01-01 DIAGNOSIS — Z0000 Encounter for general adult medical examination without abnormal findings: Secondary | ICD-10-CM | POA: Diagnosis not present

## 2018-01-01 DIAGNOSIS — I10 Essential (primary) hypertension: Secondary | ICD-10-CM | POA: Diagnosis not present

## 2018-01-01 DIAGNOSIS — J449 Chronic obstructive pulmonary disease, unspecified: Secondary | ICD-10-CM | POA: Diagnosis not present

## 2018-01-03 ENCOUNTER — Encounter
Admission: RE | Admit: 2018-01-03 | Discharge: 2018-01-03 | Disposition: A | Discharge status: Home / Self Care | Payer: Medicare Other | Source: Ambulatory Visit | Attending: Internal Medicine | Admitting: Internal Medicine

## 2018-01-12 ENCOUNTER — Non-Acute Institutional Stay (SKILLED_NURSING_FACILITY): Payer: Medicare Other | Admitting: Adult Health

## 2018-01-12 ENCOUNTER — Encounter: Payer: Self-pay | Admitting: Adult Health

## 2018-01-12 DIAGNOSIS — D509 Iron deficiency anemia, unspecified: Secondary | ICD-10-CM

## 2018-01-12 DIAGNOSIS — N183 Chronic kidney disease, stage 3 unspecified: Secondary | ICD-10-CM

## 2018-01-12 DIAGNOSIS — F325 Major depressive disorder, single episode, in full remission: Secondary | ICD-10-CM

## 2018-01-12 DIAGNOSIS — J449 Chronic obstructive pulmonary disease, unspecified: Secondary | ICD-10-CM | POA: Diagnosis not present

## 2018-01-12 DIAGNOSIS — Z8673 Personal history of transient ischemic attack (TIA), and cerebral infarction without residual deficits: Secondary | ICD-10-CM | POA: Diagnosis not present

## 2018-01-12 DIAGNOSIS — I10 Essential (primary) hypertension: Secondary | ICD-10-CM | POA: Diagnosis not present

## 2018-01-12 DIAGNOSIS — J309 Allergic rhinitis, unspecified: Secondary | ICD-10-CM | POA: Diagnosis not present

## 2018-01-12 DIAGNOSIS — I480 Paroxysmal atrial fibrillation: Secondary | ICD-10-CM | POA: Diagnosis not present

## 2018-01-12 NOTE — Progress Notes (Signed)
[facility]  [facility] Room Number: [place]:  SNF (31) Provider:  Durenda Age, NP  Patient Care Team: Juluis Pitch, MD as PCP - General [facility])  Extended Emergency Contact Information Primary Emergency Contact: [name] Address: [place], [place] [address] [name] of [place] Phone: [phone] Relation: Daughter Secondary Emergency Contact: Lingerfelt,Brad Address: [address] [place], [place] [address] [name] of [place] Phone: [phone] Relation: [name]  Code Status:  DNR  Goals of care: Advanced Directive information Advanced Directives 01/12/2018  Does Patient Have a Medical Advance Directive? Yes  Type of Advance Directive Out of facility DNR (pink MOST or yellow form)  Does patient want to make changes to medical advance directive? No - Patient declined  Would patient like information on creating a medical advance directive? -  Pre-existing out of facility DNR order (yellow form or pink MOST form) Yellow form placed in chart (order not valid for inpatient use)     Chief Complaint  Patient presents with  . Medical Management of Chronic Issues    Routine Visit    HPI:  Pt is a 84 y.o. female seen today for medical management of chronic diseases. She has PMH of COPD, CVA, PAF, and CKD stage 3. She was seen in the room today. BPs has been well-controlled - 120/78, 122/52, 32/60, 126/72. No SOB has been reported.    Past Medical History:  Diagnosis Date  . Adenoma of rectum   . Adrenal adenoma   . Anemia   . Anemia, unspecified   . Arthritis   . Ascending aortic aneurysm ([place])    a. s/p repair 07/2014  . Atrophic vaginitis   . Benign neoplasm of colon, unspecified   . COPD (chronic obstructive pulmonary disease) ([place])   . Coronary artery disease, non-occlusive    a. LHC 06/2014 without evidence of obstructive disease  . Depression     . Diverticulosis   . Dysphagia   . Dysrhythmia   . Essential hypertension    benign  . Frequent UTI   . GERD (gastroesophageal reflux disease)   . Hemiplegia and hemiparesis ([place])    following cerebral infarction affecting left non-dominant side  . Hypertension   . Microscopic hematuria   . Nonrheumatic aortic valve insufficiency   . Osteoporosis    unspecified  . Panic attacks   . Paroxysmal atrial fibrillation (HCC)   . Pernicious anemia   . Renal cyst    CKD STAGE 3  . Stroke ([place]) 07/23/2014   a. post-operative setting in 07/2014 leading to left UE hemiapresis and left LE weakness  . Urge incontinence    Past Surgical History:  Procedure Laterality Date  . ABDOMINAL HYSTERECTOMY  1972  . AORTOILIAC BYPASS    . ASCENDING AORTIC ANEURYSM REPAIR  07/23/2014   07/2014  . BLEPHAROPLASTY    . CATARACT EXTRACTION  2005  . GASTROSTOMY W/ FEEDING TUBE    . INTRAMEDULLARY (IM) NAIL INTERTROCHANTERIC Left 02/26/2016   Procedure: INTRAMEDULLARY (IM) NAIL INTERTROCHANTRIC;  Surgeon: Earnestine Leys, MD;  Location: [facility] ORS;  Service: Orthopedics;  Laterality: Left;  . KYPHOPLASTY N/A 08/26/2016   Procedure: KYPHOPLASTY T12;  Surgeon: Hessie Knows, MD;  Location: [facility] ORS;  Service: Orthopedics;  Laterality: N/A;  . LEFT HEART CATH  06/2014  . PR ASCEND AORTIC GRAFT INCL VALVE SUSPENSION  07/07/2014   Procedure: ASCENDING AORTA GRAFT, WITH CARDIOPULMONARY BYPASS, WITH OR WITHOUT VALVE SUSPENSION; Surgeon: Dwaine Deter, MD; Location: MA[facility] OR [facility]; Service: Cardiothoracic  . PR LAP, GASTROTOMY W/O TUBE CONSTR  08/08/2014   Procedure: LAPAROSCOPY, SURGICAL; GASTOSTOMY W/O CONSTRUCTION OF GASTRIC TUBE (EG, STAMM PROCEDURE)(SEPARATE PROCED); Surgeon: Fredrik Rigger, MD; Location: MA[facility]; Service: Gastrointestinal  . PR PLACE PERCUT GASTROSTOMY TUBE  08/08/2014   Procedure: UGI ENDO; W/DIRECTED PLCMT PERQ GASTROSTOMY TUBE; Surgeon: Fredrik Rigger, MD; Location: MAIN  OR [facility]; Service: Gastrointestinal    Allergies  Allergen Reactions  . Iodinated Diagnostic Agents Itching and Swelling    Other reaction(s): Unknown  . Nitrofurantoin Diarrhea    Other reaction(s): Unknown  . Omnipaque [Iohexol]   . Solifenacin Other (See Comments)    indigestion  . Ciprofloxacin Other (See Comments) and Rash    Colitis  . Metronidazole Itching and Rash  . Sulfa Antibiotics Itching and Rash    Outpatient Encounter Medications as of 01/12/2018  Medication Sig  . acetaminophen (TYLENOL) 325 MG tablet Take 650 mg by mouth 4 (four) times daily.   Marland Kitchen albuterol (PROVENTIL HFA;VENTOLIN HFA) 108 (90 Base) MCG/ACT inhaler Inhale 2 puffs into the lungs every 4 (four) hours as needed for wheezing or shortness of breath.  Marland Kitchen alum & mag hydroxide-simeth (MAALOX PLUS) 400-400-40 MG/5ML suspension Take 30 mLs by mouth every 4 (four) hours as needed.  Marland Kitchen apixaban (ELIQUIS) 2.5 MG TABS tablet Take 2.5 mg by mouth 2 (two) times daily.  Marland Kitchen atorvastatin (LIPITOR) 20 MG tablet Take 20 mg by mouth at bedtime.   Marland Kitchen BREO ELLIPTA 100-25 MCG/INH AEPB Inhale 1 puff into the lungs daily. Rinse mouth well after use  . carboxymethylcellulose (REFRESH TEARS) 0.5 % SOLN Place 2 drops into both eyes See admin instructions. 1 to 4 times daily prn  . Cholecalciferol 4000 units CAPS Take 1 capsule by mouth daily.  Marland Kitchen dextromethorphan-guaiFENesin (MUCINEX DM) 30-600 MG 12hr tablet Take 1 tablet by mouth 2 (two) times daily.  . diclofenac sodium (VOLTAREN) 1 % GEL Apply 4 g topically 4 (four) times daily as needed (pain). To lumbar spine  . diltiazem (CARDIZEM) 60 MG tablet Take 60 mg by mouth daily.  Marland Kitchen docusate sodium (COLACE) 100 MG capsule Take 1 capsule (100 mg total) by mouth 2 (two) times daily.  . ferrous sulfate 325 (65 FE) MG tablet Take 325 mg by mouth daily with breakfast.  . fluticasone (FLONASE) 50 MCG/ACT nasal spray Place 1 spray into both nostrils daily.   Marland Kitchen gabapentin (NEURONTIN) 100 MG  capsule Take 200 mg by mouth 3 (three) times daily. 2 caps  . guaiFENesin-codeine (ROBITUSSIN AC) 100-10 MG/5ML syrup Take 5 mLs by mouth every 6 (six) hours as needed for cough.  Marland Kitchen ipratropium-albuterol (DUONEB) 0.5-2.5 (3) MG/3ML SOLN Take 3 mLs by nebulization 3 (three) times daily.  . Lidocaine (ASPERCREME LIDOCAINE) 4 % PTCH Apply 2 patches topically daily. Apply one patch to Right Flank/sore ribs and one patch to the right forearm daily. Remove after 12 hours  . magnesium hydroxide (MILK OF MAGNESIA) 400 MG/5ML suspension Take 30 mLs by mouth daily as needed for mild constipation.  . methocarbamol (ROBAXIN) 500 MG tablet Take 250 mg by mouth 2 (two) times daily. 1/2 tablet  . metoCLOPramide (REGLAN) 5 MG tablet Take 2.5 mg by mouth 2 (two) times daily. 1/2 tab  . montelukast (SINGULAIR) 10 MG tablet Take 10 mg by mouth at bedtime.  Marland Kitchen oxycodone (OXY-IR) 5  MG capsule Take 5 mg by mouth every 4 (four) hours as needed. For moderate or severe pain  . OXYGEN Inhale 2 L into the lungs continuous as needed.  . pantoprazole (PROTONIX) 40 MG tablet Take 40 mg by mouth daily.  Vladimir Faster Glycol-Propyl Glycol (SYSTANE) 0.4-0.3 % GEL ophthalmic gel Place 1 application into both eyes at bedtime. (2 drops ) for dry eyes (Ointments are on Backorder)  . polyethylene glycol (MIRALAX) packet Take 17 g by mouth 2 (two) times daily.  . potassium chloride SA (K-DUR,KLOR-CON) 20 MEQ tablet Take 20 mEq by mouth daily.   . predniSONE (DELTASONE) 10 MG tablet Take 10 mg by mouth daily with breakfast.  . ranitidine (ZANTAC) 75 MG tablet Take 75 mg by mouth 2 (two) times daily.   Marland Kitchen senna-docusate (SENNA-PLUS) 8.6-50 MG tablet Take 2 tablets by mouth 2 (two) times daily as needed.   . sertraline (ZOLOFT) 100 MG tablet Take 100 mg by mouth daily. 8 pm  . sertraline (ZOLOFT) 50 MG tablet Take 50 mg by mouth at bedtime. 8 pm  . Skin Protectants, Misc. (MINERIN) CREA Apply liberal amount daily to arms and legs after the  bath and PRN for skin integrity  . sodium phosphate Pediatric (FLEET) 3.5-9.5 GM/59ML enema Place 1 enema rectally once as needed for severe constipation. Take once every 3 days if constipation is not relieved by Milk of magnesia or Bisacodyl Suppository  . torsemide (DEMADEX) 10 MG tablet Take 15 mg by mouth daily. 1 and 1/2 tab  . UNABLE TO FIND Diet Type: NAS  . vitamin B-12 (CYANOCOBALAMIN) 250 MCG tablet Take 250 mcg by mouth daily.  . [DISCONTINUED] ferrous sulfate 325 (65 FE) MG tablet Take 1 tablet (325 mg total) by mouth 2 (two) times daily with a meal.   No facility-administered encounter medications on file as of 01/12/2018.     Review of Systems  GENERAL: No change in appetite, no fatigue, no weight changes, no fever, chills or weakness MOUTH and THROAT: Denies oral discomfort, gingival pain or bleeding RESPIRATORY: no cough, SOB, DOE, wheezing, hemoptysis CARDIAC: No chest pain, edema or palpitations GI: No abdominal pain, diarrhea, constipation, heart burn, nausea or vomiting GU: Denies dysuria, frequency, hematuria, or discharge PSYCHIATRIC: Denies feelings of depression or anxiety. No report of hallucinations, insomnia, paranoia, or agitation    Immunization History  Administered Date(s) Administered  . Influenza Inj Mdck Quad Pf 02/14/2016  . Influenza-Unspecified 02/02/2014, 01/27/2017  . PPD Test 08/10/2014, 08/24/2014, 02/20/2016  . Pneumococcal-Unspecified 02/20/2016   Pertinent  Health Maintenance Due  Topic Date Due  . INFLUENZA VACCINE  12/03/2017  . PNA vac Low Risk Adult (2 of 2 - PCV13) 01/16/2019 (Originally 02/19/2017)  . DEXA SCAN  Discontinued      Vitals:   01/12/18 1525  BP: 122/78  Pulse: 86  Resp: 20  Temp: (!) 97.1 F (36.2 C)  TempSrc: Oral  SpO2: 96%  Weight: 134 lb 12.8 oz (61.1 kg)  Height: 5\' 1"  (0.865 m)   Body mass index is 25.47 kg/m.  Physical Exam  GENERAL APPEARANCE: Well nourished. In no acute distress. Normal body  habitus SKIN:  Skin is warm and dry.  MOUTH and THROAT: Lips are without lesions. Oral mucosa is moist and without lesions.  RESPIRATORY: Breathing is even & unlabored, BS CTAB CARDIAC: RRR, no murmur,no extra heart sounds, no edema GI: Abdomen soft, normal BS, no masses, no tenderness EXTREMITIES:  No edema, no cyanosis NEUROLOGICAL: Not able to  move LUE with splint, weak LLE PSYCHIATRIC: Alert and oriented X 3. Affect and behavior are appropriate  Labs reviewed: Recent Labs    06/02/17 0640 06/09/17 0700 07/30/17 0430 11/03/17 0640  NA 136 139 141 140  K 5.4* 3.8 4.1 3.9  CL 110 105 105 103  CO2 21* 25 27 30   GLUCOSE 85 76 86 87  BUN 21* 28* 23* 29*  CREATININE 1.25* 1.18* 1.18* 1.07*  CALCIUM 8.8* 8.4* 8.6* 8.7*  MG 1.7  --   --  2.2   Recent Labs    05/29/17 0500 06/02/17 0640 11/03/17 0640  AST 13* 14* 22  ALT 8* 9* 13  ALKPHOS 69 71 76  BILITOT 0.5 0.5 0.5  PROT 5.4* 5.7* 5.6*  ALBUMIN 3.3* 3.5 3.5   Recent Labs    05/29/17 0500 06/02/17 0640 12/23/17 0600  WBC 9.3 10.6 6.4  NEUTROABS 6.9* 8.1* 3.6  HGB 9.7* 10.6* 10.9*  HCT 29.3* 31.7* 31.4*  MCV 90.0 90.4 91.8  PLT 229 241 169   Lab Results  Component Value Date   TSH 5.829 (H) 11/03/2017   Lab Results  Component Value Date   HGBA1C 5.9 (H) 02/25/2016   Lab Results  Component Value Date   CHOL 153 11/03/2017   HDL 83 11/03/2017   LDLCALC 59 11/03/2017   TRIG 53 11/03/2017   CHOLHDL 1.8 11/03/2017     Assessment/Plan  1. History of CVA (cerebrovascular accident) - stable, continue atorvastatin 20 mg 1 tab daily at bedtime, eliquis 2.5 mg 1 tab twice a day, Torsemide 10 mg give 1 1/2 tab = 15 mg daily   2. Chronic kidney disease, stage III (moderate) (HCC) - stable Lab Results  Component Value Date   CREATININE 1.07 (H) 11/03/2017     3. Benign essential HTN - well controlled, continue Cardizem 60 mg 1 tab daily and Torsemide 10 mg give 1 1/2 tab = 15 mg daily   4. Allergic  rhinitis, unspecified seasonality, unspecified trigger - stable, continue fluticasone 50 g 1 spray to each nostril daily, montelukast 10 mg 1 tab  at bedtime   5. 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  Iron deficiency anemia, unspecified -  Continue FeSO4 325 mg 1 tab daily Lab Results  Component Value Date   HGB 10.9 (L) 12/23/2017    7. Paroxysmal atrial fibrillation (HCC) - rate controlled, continue Cardizem 60 mg 1 tab daily, eliquis 2.5 mg 1 tab twice a day  8. COPD - no SOB, continue prednisone 10 mg 1 tab daily, ipratropium-albuterol neb TID, mucinex DM Q 12 hours,     Family/ staff Communication: Discussed plan of care with resident  Labs/tests ordered:  None  Goals of care:   [place], NP [facility] [phone] (Monday-Friday 8:00 a.m. - 5:00 p.m.) [phone] (after hours)

## 2018-01-15 ENCOUNTER — Non-Acute Institutional Stay (SKILLED_NURSING_FACILITY): Payer: Medicare Other | Admitting: Adult Health

## 2018-01-15 ENCOUNTER — Encounter: Payer: Self-pay | Admitting: Adult Health

## 2018-01-15 DIAGNOSIS — I69354 Hemiplegia and hemiparesis following cerebral infarction affecting left non-dominant side: Secondary | ICD-10-CM

## 2018-01-15 DIAGNOSIS — Z8673 Personal history of transient ischemic attack (TIA), and cerebral infarction without residual deficits: Secondary | ICD-10-CM | POA: Diagnosis not present

## 2018-01-15 DIAGNOSIS — L03114 Cellulitis of left upper limb: Secondary | ICD-10-CM | POA: Diagnosis not present

## 2018-01-15 NOTE — Progress Notes (Signed)
[facility] [place] Number: [place] of Service:  SNF ([phone]) Provider:  Durenda Age, NP  Patient Care Team: Juluis Pitch, MD as PCP - General [facility] Medicine)  Extended Emergency Contact Information Primary Emergency Contact: [name] Address: [place], [place] [address] [name] of [place] Phone: [phone] Relation: Daughter Secondary Emergency Contact: Lingerfelt,Brad Address: [place], [place] [address] [name] of [place] Phone: [phone] Relation: [name]  Code Status:  DNR  Goals of care: Advanced Directive information Advanced Directives 01/15/2018  Does Patient Have a Medical Advance Directive? Yes  Type of Advance Directive Out of facility DNR (pink MOST or yellow form)  Does patient want to make changes to medical advance directive? No - Patient declined  Would patient like information on creating a medical advance directive? -  Pre-existing out of facility DNR order (yellow form or pink MOST form) Yellow form placed in chart (order not valid for inpatient use)     Chief Complaint  Patient presents with  . Acute Visit    Skin tear on Left Hand    HPI:  Pt is a 84 y.o. female seen today for skin tear on left hand. She said that she hit her hand on the railing by accident, one night. Noted. Skin tear is erythematous and warm to touch. She is not able to move left hand due to history of stroke. Left hand is edematous, 2+.   Past Medical History:  Diagnosis Date  . Adenoma of rectum   . Adrenal adenoma   . Anemia   . Anemia, unspecified   . Arthritis   . Ascending aortic aneurysm ([place])    a. s/p repair 07/2014  . Atrophic vaginitis   . Benign neoplasm of colon, unspecified   . COPD (chronic obstructive pulmonary disease) ([place])   . Coronary artery disease, non-occlusive    a. LHC 06/2014 without evidence of obstructive disease  .  Depression   . Diverticulosis   . Dysphagia   . Dysrhythmia   . Essential hypertension    benign  . Frequent UTI   . GERD (gastroesophageal reflux disease)   . Hemiplegia and hemiparesis ([place])    following cerebral infarction affecting left non-dominant side  . Hypertension   . Microscopic hematuria   . Nonrheumatic aortic valve insufficiency   . Osteoporosis    unspecified  . Panic attacks   . Paroxysmal atrial fibrillation (HCC)   . Pernicious anemia   . Renal cyst    CKD STAGE 3  . Stroke ([place]) 07/23/2014   a. post-operative setting in 07/2014 leading to left UE hemiapresis and left LE weakness  . Urge incontinence    Past Surgical History:  Procedure Laterality Date  . ABDOMINAL HYSTERECTOMY  1972  . AORTOILIAC BYPASS    . ASCENDING AORTIC ANEURYSM REPAIR  07/23/2014   07/2014  . BLEPHAROPLASTY    . CATARACT EXTRACTION  2005  . GASTROSTOMY W/ FEEDING TUBE    . INTRAMEDULLARY (IM) NAIL INTERTROCHANTERIC Left 02/26/2016   Procedure: INTRAMEDULLARY (IM) NAIL INTERTROCHANTRIC;  Surgeon: Earnestine Leys, MD;  Location: [facility] ORS;  Service: Orthopedics;  Laterality: Left;  . KYPHOPLASTY N/A 08/26/2016   Procedure: KYPHOPLASTY T12;  Surgeon: Hessie Knows, MD;  Location: [facility] ORS;  Service: Orthopedics;  Laterality: N/A;  . LEFT HEART CATH  06/2014  .  PR ASCEND AORTIC GRAFT INCL VALVE SUSPENSION  07/07/2014   Procedure: ASCENDING AORTA GRAFT, WITH CARDIOPULMONARY BYPASS, WITH OR WITHOUT VALVE SUSPENSION; Surgeon: Dwaine Deter, MD; Location: MA[facility]; Service: Cardiothoracic  . PR LAP, GASTROTOMY W/O TUBE CONSTR  08/08/2014   Procedure: LAPAROSCOPY, SURGICAL; GASTOSTOMY W/O CONSTRUCTION OF GASTRIC TUBE (EG, STAMM PROCEDURE)(SEPARATE PROCED); Surgeon: Fredrik Rigger, MD; Location: MAIN OR [facility]; Service: Gastrointestinal  . PR PLACE PERCUT GASTROSTOMY TUBE  08/08/2014   Procedure: UGI ENDO; W/DIRECTED PLCMT PERQ GASTROSTOMY TUBE; Surgeon: Fredrik Rigger, MD;  Location: [facility]; Service: Gastrointestinal    Allergies  Allergen Reactions  . Iodinated Diagnostic Agents Itching and Swelling    Other reaction(s): Unknown  . Nitrofurantoin Diarrhea    Other reaction(s): Unknown  . Omnipaque [Iohexol]   . Solifenacin Other (See Comments)    indigestion  . Ciprofloxacin Other (See Comments) and Rash    Colitis  . Metronidazole Itching and Rash  . Sulfa Antibiotics Itching and Rash    Outpatient Encounter Medications as of 01/15/2018  Medication Sig  . acetaminophen (TYLENOL) 325 MG tablet Take 650 mg by mouth 4 (four) times daily.   Marland Kitchen albuterol (PROVENTIL HFA;VENTOLIN HFA) 108 (90 Base) MCG/ACT inhaler Inhale 2 puffs into the lungs every 4 (four) hours as needed for wheezing or shortness of breath.  Marland Kitchen alum & mag hydroxide-simeth (MAALOX PLUS) 400-400-40 MG/5ML suspension Take 30 mLs by mouth every 4 (four) hours as needed.  Marland Kitchen apixaban (ELIQUIS) 2.5 MG TABS tablet Take 2.5 mg by mouth 2 (two) times daily.  Marland Kitchen atorvastatin (LIPITOR) 20 MG tablet Take 20 mg by mouth at bedtime.   Marland Kitchen BREO ELLIPTA 100-25 MCG/INH AEPB Inhale 1 puff into the lungs daily. Rinse mouth well after use  . carboxymethylcellulose (REFRESH TEARS) 0.5 % SOLN Place 2 drops into both eyes See admin instructions. 1 to 4 times daily prn  . cephALEXin (KEFLEX) 500 MG capsule Take 500 mg by mouth 4 (four) times daily.  . Cholecalciferol 4000 units CAPS Take 1 capsule by mouth daily.  Marland Kitchen dextromethorphan-guaiFENesin (MUCINEX DM) 30-600 MG 12hr tablet Take 1 tablet by mouth 2 (two) times daily.  . diclofenac sodium (VOLTAREN) 1 % GEL Apply 4 g topically 4 (four) times daily as needed (pain). To lumbar spine  . diltiazem (CARDIZEM) 60 MG tablet Take 60 mg by mouth daily.  Marland Kitchen docusate sodium (COLACE) 100 MG capsule Take 1 capsule (100 mg total) by mouth 2 (two) times daily.  . ferrous sulfate 325 (65 FE) MG tablet Take 325 mg by mouth daily with breakfast.  . fluticasone (FLONASE) 50  MCG/ACT nasal spray Place 1 spray into both nostrils daily.   Marland Kitchen gabapentin (NEURONTIN) 100 MG capsule Take 200 mg by mouth 3 (three) times daily. 2 caps  . guaiFENesin-codeine (ROBITUSSIN AC) 100-10 MG/5ML syrup Take 5 mLs by mouth every 6 (six) hours as needed for cough.  Marland Kitchen ipratropium-albuterol (DUONEB) 0.5-2.5 (3) MG/3ML SOLN Take 3 mLs by nebulization 3 (three) times daily.  . Lidocaine (ASPERCREME LIDOCAINE) 4 % PTCH Apply 2 patches topically daily. Apply one patch to Right Flank/sore ribs and one patch to the right forearm daily. Remove after 12 hours  . magnesium hydroxide (MILK OF MAGNESIA) 400 MG/5ML suspension Take 30 mLs by mouth daily as needed for mild constipation.  . methocarbamol (ROBAXIN) 500 MG tablet Take 250 mg by mouth 2 (two) times daily. 1/2 tablet  . metoCLOPramide (REGLAN) 5 MG tablet Take 2.5 mg by mouth 2 (  two) times daily. 1/2 tab  . montelukast (SINGULAIR) 10 MG tablet Take 10 mg by mouth at bedtime.  Marland Kitchen oxycodone (OXY-IR) 5 MG capsule Take 5 mg by mouth every 4 (four) hours as needed. For moderate or severe pain  . OXYGEN Inhale 2 L into the lungs continuous as needed.  . pantoprazole (PROTONIX) 40 MG tablet Take 40 mg by mouth daily.  Vladimir Faster Glycol-Propyl Glycol (SYSTANE) 0.4-0.3 % GEL ophthalmic gel Place 1 application into both eyes at bedtime. (2 drops ) for dry eyes (Ointments are on Backorder)  . polyethylene glycol (MIRALAX) packet Take 17 g by mouth 2 (two) times daily.  . potassium chloride SA (K-DUR,KLOR-CON) 20 MEQ tablet Take 20 mEq by mouth daily.   . predniSONE (DELTASONE) 10 MG tablet Take 10 mg by mouth daily with breakfast.  . ranitidine (ZANTAC) 75 MG tablet Take 75 mg by mouth 2 (two) times daily.   Marland Kitchen saccharomyces boulardii (FLORASTOR) 250 MG capsule Take 250 mg by mouth 2 (two) times daily. One capsule twice daily x 10 days d/t use of Keflex for Cellulitis  . senna-docusate (SENNA-PLUS) 8.6-50 MG tablet Take 2 tablets by mouth 2 (two) times  daily as needed.   . sertraline (ZOLOFT) 100 MG tablet Take 100 mg by mouth daily. 8 pm  . sertraline (ZOLOFT) 50 MG tablet Take 50 mg by mouth at bedtime. 8 pm  . Skin Protectants, Misc. (MINERIN) CREA Apply liberal amount daily to arms and legs after the bath and PRN for skin integrity  . sodium phosphate Pediatric (FLEET) 3.5-9.5 GM/59ML enema Place 1 enema rectally once as needed for severe constipation. Take once every 3 days if constipation is not relieved by Milk of magnesia or Bisacodyl Suppository  . torsemide (DEMADEX) 10 MG tablet Take 15 mg by mouth daily. 1 and 1/2 tab  . UNABLE TO FIND Diet Type: NAS  . vitamin B-12 (CYANOCOBALAMIN) 250 MCG tablet Take 250 mcg by mouth daily.   No facility-administered encounter medications on file as of 01/15/2018.     Review of Systems  GENERAL: No change in appetite, no fatigue, no weight changes, no fever, chills or weakness MOUTH and THROAT: Denies oral discomfort, gingival pain or bleeding RESPIRATORY: no cough, SOB, DOE, wheezing, hemoptysis CARDIAC: No chest pain, edema or palpitations GI: No abdominal pain, diarrhea, constipation, heart burn, nausea or vomiting GU: Denies dysuria, frequency, hematuria, or discharge PSYCHIATRIC: Denies feelings of depression or anxiety. No report of hallucinations, insomnia, paranoia, or agitation   Immunization History  Administered Date(s) Administered  . Influenza Inj Mdck Quad Pf 02/14/2016  . Influenza-Unspecified 02/02/2014, 01/27/2017  . PPD Test 08/10/2014, 08/24/2014, 02/20/2016  . Pneumococcal-Unspecified 02/20/2016   Pertinent  Health Maintenance Due  Topic Date Due  . INFLUENZA VACCINE  12/03/2017  . PNA vac Low Risk Adult (2 of 2 - PCV13) 01/16/2019 (Originally 02/19/2017)  . DEXA SCAN  Discontinued       Vitals:   01/15/18 1537  BP: 122/78  Pulse: 86  Resp: 20  Temp: (!) 97.1 F (36.2 C)  TempSrc: Oral  SpO2: 96%  Weight: 133 lb 3.2 oz (60.4 kg)  Height: 5\' 1"  (1.549  m)   Body mass index is 25.17 kg/m.  Physical Exam  GENERAL APPEARANCE: Well nourished. In no acute distress. Normal body habitus SKIN:  Has skin tear on left hand which is erythematous and warm to touch MOUTH and THROAT: Lips are without lesions. Oral mucosa is moist and without lesions.  RESPIRATORY: Breathing is even & unlabored, BS CTAB CARDIAC: RRR, no murmur,no extra heart sounds, no edema GI: Abdomen soft, normal BS, no masses, no tenderness EXTREMITIES:  Unable to move LUE, weak LLE PSYCHIATRIC: Alert and oriented X 3. Affect and behavior are appropriate   Labs reviewed: Recent Labs    06/02/17 0640 06/09/17 0700 07/30/17 0430 11/03/17 0640  NA 136 139 141 140  K 5.4* 3.8 4.1 3.9  CL 110 105 105 103  CO2 21* 25 27 30   GLUCOSE 85 76 86 87  BUN 21* 28* 23* 29*  CREATININE 1.25* 1.18* 1.18* 1.07*  CALCIUM 8.8* 8.4* 8.6* 8.7*  MG 1.7  --   --  2.2   Recent Labs    05/29/17 0500 06/02/17 0640 11/03/17 0640  AST 13* 14* 22  ALT 8* 9* 13  ALKPHOS 69 71 76  BILITOT 0.5 0.5 0.5  PROT 5.4* 5.7* 5.6*  ALBUMIN 3.3* 3.5 3.5   Recent Labs    05/29/17 0500 06/02/17 0640 12/23/17 0600  WBC 9.3 10.6 6.4  NEUTROABS 6.9* 8.1* 3.6  HGB 9.7* 10.6* 10.9*  HCT 29.3* 31.7* 31.4*  MCV 90.0 90.4 91.8  PLT 229 241 169   Lab Results  Component Value Date   TSH 5.829 (H) 11/03/2017   Lab Results  Component Value Date   HGBA1C 5.9 (H) 02/25/2016   Lab Results  Component Value Date   CHOL 153 11/03/2017   HDL 83 11/03/2017   LDLCALC 59 11/03/2017   TRIG 53 11/03/2017   CHOLHDL 1.8 11/03/2017      Assessment/Plan  1. Cellulitis of left hand - left hand skin tear is erythematous and warm to touch, will start Keflex 500 mg QID X 7 days and Florastor 250 mg 1 capsule BID X 10 days, continue treatment daily   2. History of CVA (cerebrovascular accident) - stable, continue Cardizem 60 mg 1 tab daily, Atorvastatin 20 mg 1 tab Q HS, Eliquis 2.5 mg 1 tab BID, and  Torsemide 10 mg  1 1/2 tab = 15 mg daily   3. Hemiplegia and hemiparesis following cerebral infarction affecting left non-dominant side ([place]) - continue supportive care, fall precautions     Family/ staff Communication: Discussed plan of care with resident.  Labs/tests ordered:  None  Goals of care:   [place], NP [facility] and Adult Medicine [phone] (Monday-Friday 8:00 a.m. - 5:00 p.m.) [phone] (after hours)

## 2018-02-01 ENCOUNTER — Encounter: Payer: Self-pay | Admitting: Adult Health

## 2018-02-01 NOTE — Progress Notes (Signed)
[facility] Room Number: [place] of Service:  SNF ([phone])   CODE STATUS: DNR  Allergies  Allergen Reactions  . Iodinated Diagnostic Agents Itching and Swelling    Other reaction(s): Unknown  . Nitrofurantoin Diarrhea    Other reaction(s): Unknown  . Omnipaque [Iohexol]   . Solifenacin Other (See Comments)    indigestion  . Ciprofloxacin Other (See Comments) and Rash    Colitis  . Metronidazole Itching and Rash  . Sulfa Antibiotics Itching and Rash    Chief Complaint  Patient presents with  . Acute Visit    Congestion    HPI:    Past Medical History:  Diagnosis Date  . Adenoma of rectum   . Adrenal adenoma   . Anemia   . Anemia, unspecified   . Arthritis   . Ascending aortic aneurysm ([place])    a. s/p repair 07/2014  . Atrophic vaginitis   . Benign neoplasm of colon, unspecified   . COPD (chronic obstructive pulmonary disease) ([place])   . Coronary artery disease, non-occlusive    a. LHC 06/2014 without evidence of obstructive disease  . Depression   . Diverticulosis   . Dysphagia   . Dysrhythmia   . Essential hypertension    benign  . Frequent UTI   . GERD (gastroesophageal reflux disease)   . Hemiplegia and hemiparesis ([place])    following cerebral infarction affecting left non-dominant side  . Hypertension   . Microscopic hematuria   . Nonrheumatic aortic valve insufficiency   . Osteoporosis    unspecified  . Panic attacks   . Paroxysmal atrial fibrillation (HCC)   . Pernicious anemia   . Renal cyst    CKD STAGE 3  . Stroke ([place]) 07/23/2014   a. post-operative setting in 07/2014 leading to left UE hemiapresis and left LE weakness  . Urge incontinence     Past Surgical History:  Procedure Laterality Date  . ABDOMINAL HYSTERECTOMY  1972  . AORTOILIAC BYPASS    . ASCENDING AORTIC ANEURYSM REPAIR  07/23/2014   07/2014  . BLEPHAROPLASTY    . CATARACT EXTRACTION  2005  . GASTROSTOMY W/ FEEDING TUBE    .  INTRAMEDULLARY (IM) NAIL INTERTROCHANTERIC Left 02/26/2016   Procedure: INTRAMEDULLARY (IM) NAIL INTERTROCHANTRIC;  Surgeon: Earnestine Leys, MD;  Location: [facility] ORS;  Service: Orthopedics;  Laterality: Left;  . KYPHOPLASTY N/A 08/26/2016   Procedure: KYPHOPLASTY T12;  Surgeon: Hessie Knows, MD;  Location: [facility] ORS;  Service: Orthopedics;  Laterality: N/A;  . LEFT HEART CATH  06/2014  . PR ASCEND AORTIC GRAFT INCL VALVE SUSPENSION  07/07/2014   Procedure: ASCENDING AORTA GRAFT, WITH CARDIOPULMONARY BYPASS, WITH OR WITHOUT VALVE SUSPENSION; Surgeon: Dwaine Deter, MD; Location: MAIN OR [facility]; Service: Cardiothoracic  . PR LAP, GASTROTOMY W/O TUBE CONSTR  08/08/2014   Procedure: LAPAROSCOPY, SURGICAL; GASTOSTOMY W/O CONSTRUCTION OF GASTRIC TUBE (EG, STAMM PROCEDURE)(SEPARATE PROCED); Surgeon: Fredrik Rigger, MD; Location: MAIN OR [facility]; Service: Gastrointestinal  . PR PLACE PERCUT GASTROSTOMY TUBE  08/08/2014   Procedure: UGI ENDO; W/DIRECTED PLCMT PERQ GASTROSTOMY TUBE; Surgeon: Fredrik Rigger, MD; Location: MAIN OR [facility]; Service: Gastrointestinal    Social History   Socioeconomic History  . Marital status: Widowed    Spouse name: Not on file  . Number of children: 3  . Years of education: 30  . Highest education level: Not on file  Occupational History  . Not on file  Social Needs  . Financial  resource strain: Not on file  . Food insecurity:    Worry: Not on file    Inability: Not on file  . Transportation needs:    Medical: Not on file    Non-medical: Not on file  Tobacco Use  . Smoking status: Former Smoker    Packs/day: 0.50    Years: 50.00    Pack years: 25.00    Types: Cigarettes    Last attempt to quit: 05/30/2014    Years since quitting: 3.6  . Smokeless tobacco: Never Used  Substance and Sexual Activity  . Alcohol use: No    Comment: occasionally  . Drug use: No  . Sexual activity: Not on file  Lifestyle  . Physical activity:    Days per week: Not  on file    Minutes per session: Not on file  . Stress: Not on file  Relationships  . Social connections:    Talks on phone: Not on file    Gets together: Not on file    Attends religious service: Not on file    Active member of club or organization: Not on file    Attends meetings of clubs or organizations: Not on file    Relationship status: Not on file  . Intimate partner violence:    Fear of current or ex partner: Not on file    Emotionally abused: Not on file    Physically abused: Not on file    Forced sexual activity: Not on file  Other Topics Concern  . Not on file  Social History Narrative   Admitted to [place] 03/01/2016   Widowed   4 children   Former Smoker   Occasionally drinks   DNR   Family History  Problem Relation Age of Onset  . Lung cancer Son   . CAD Father   . Heart attack Father   . CAD Brother   . Heart attack Brother   . Stroke Mother       VITAL SIGNS BP (!) 145/62   Pulse 76   Temp 98 F (36.7 C)   Resp 18   Ht 5\' 1"  (1.549 m)   Wt 141 lb 3.2 oz (64 kg)   SpO2 97%   BMI 26.68 kg/m   Outpatient Encounter Medications as of 02/01/2018  Medication Sig  . acetaminophen (TYLENOL) 325 MG tablet Take 650 mg by mouth 4 (four) times daily.   Marland Kitchen albuterol (PROVENTIL HFA;VENTOLIN HFA) 108 (90 Base) MCG/ACT inhaler Inhale 2 puffs into the lungs every 4 (four) hours as needed for wheezing or shortness of breath.  Marland Kitchen alum & mag hydroxide-simeth (MAALOX PLUS) 400-400-40 MG/5ML suspension Give 30 ml by mouth every 4 hours as needed  . apixaban (ELIQUIS) 2.5 MG TABS tablet Take 2.5 mg by mouth 2 (two) times daily.  Marland Kitchen atorvastatin (LIPITOR) 20 MG tablet Take 20 mg by mouth at bedtime.   Marland Kitchen BREO ELLIPTA 100-25 MCG/INH AEPB Inhale 1 puff into the lungs daily. Rinse mouth well after use  . carboxymethylcellulose (REFRESH TEARS) 0.5 % SOLN Place 2 drops into both eyes See admin instructions. 1 to 4 times daily prn  . Cholecalciferol 4000 units CAPS  Take 1 capsule by mouth daily.  Marland Kitchen dextromethorphan-guaiFENesin (MUCINEX DM) 30-600 MG 12hr tablet Take 1 tablet by mouth 2 (two) times daily.  . diclofenac sodium (VOLTAREN) 1 % GEL Apply 4 g topically 4 (four) times daily as needed (pain). To lumbar spine  . diltiazem (CARDIZEM) 60 MG tablet  Take 60 mg by mouth daily.  Marland Kitchen docusate sodium (COLACE) 100 MG capsule Take 1 capsule (100 mg total) by mouth 2 (two) times daily.  . ferrous sulfate 325 (65 FE) MG tablet Take 325 mg by mouth daily with breakfast.  . fluticasone (FLONASE) 50 MCG/ACT nasal spray Place 1 spray into both nostrils daily.   Marland Kitchen gabapentin (NEURONTIN) 100 MG capsule Take 200 mg by mouth 3 (three) times daily. 2 caps  . guaiFENesin-codeine (ROBITUSSIN AC) 100-10 MG/5ML syrup Take 5 mLs by mouth every 6 (six) hours as needed for cough.  Marland Kitchen ipratropium-albuterol (DUONEB) 0.5-2.5 (3) MG/3ML SOLN Take 3 mLs by nebulization 3 (three) times daily.  . Lidocaine (ASPERCREME LIDOCAINE) 4 % PTCH Apply 2 patches topically daily. Apply one patch to Right Flank/sore ribs and one patch to the right forearm daily. Remove after 12 hours  . magnesium hydroxide (MILK OF MAGNESIA) 400 MG/5ML suspension Take 30 mLs by mouth daily as needed for mild constipation.  . methocarbamol (ROBAXIN) 500 MG tablet Take 250 mg by mouth 2 (two) times daily. 1/2 tablet  . metoCLOPramide (REGLAN) 5 MG tablet Give 1/2 tablet (2.5 mg) by mouth daily x 7 days then discontinue  . montelukast (SINGULAIR) 10 MG tablet Take 10 mg by mouth at bedtime.  Marland Kitchen oxycodone (OXY-IR) 5 MG capsule Take 5 mg by mouth every 4 (four) hours as needed. For moderate or severe pain  . OXYGEN Inhale 2 L into the lungs continuous as needed.  . pantoprazole (PROTONIX) 40 MG tablet Take 40 mg by mouth daily.  Vladimir Faster Glycol-Propyl Glycol (SYSTANE) 0.4-0.3 % GEL ophthalmic gel Place 1 application into both eyes at bedtime. (2 drops ) for dry eyes (Ointments are on Backorder)  . polyethylene glycol  (MIRALAX) packet Take 17 g by mouth 2 (two) times daily.  . potassium chloride SA (K-DUR,KLOR-CON) 20 MEQ tablet Take 20 mEq by mouth daily.   . predniSONE (DELTASONE) 10 MG tablet Take 10 mg by mouth daily with breakfast.  . ranitidine (ZANTAC) 75 MG tablet Take 75 mg by mouth 2 (two) times daily.   Marland Kitchen senna-docusate (SENNA-PLUS) 8.6-50 MG tablet Take 2 tablets by mouth 2 (two) times daily as needed.   . sertraline (ZOLOFT) 100 MG tablet Take 100 mg by mouth daily. 8 pm  . sertraline (ZOLOFT) 50 MG tablet Take 50 mg by mouth at bedtime. 8 pm  . Skin Protectants, Misc. (MINERIN) CREA Apply liberal amount daily to arms and legs after the bath and PRN for skin integrity  . sodium phosphate Pediatric (FLEET) 3.5-9.5 GM/59ML enema Place 1 enema rectally once as needed for severe constipation. Take once every 3 days if constipation is not relieved by Milk of magnesia or Bisacodyl Suppository  . torsemide (DEMADEX) 10 MG tablet Take 15 mg by mouth daily. 1 and 1/2 tab  . UNABLE TO FIND Diet Type: NAS  . vitamin B-12 (CYANOCOBALAMIN) 250 MCG tablet Take 250 mcg by mouth daily.  Marland Kitchen alum & mag hydroxide-simeth (MAALOX PLUS) 400-400-40 MG/5ML suspension Take 30 mLs by mouth every 4 (four) hours as needed.  . [DISCONTINUED] magnesium hydroxide (MILK OF MAGNESIA) 400 MG/5ML suspension Take 30 mLs by mouth daily as needed for mild constipation.   No facility-administered encounter medications on file as of 02/01/2018.      SIGNIFICANT DIAGNOSTIC EXAMS       ASSESSMENT/ PLAN:    MD is aware of resident's narcotic use and is in agreement with current plan of care. We  will attempt to wean resident as apropriate   Ok Edwards NP [facility] Adult Medicine  Contact [phone] Monday through Friday 8am- 5pm  After hours call [phone]

## 2018-02-02 ENCOUNTER — Encounter
Admission: RE | Admit: 2018-02-02 | Discharge: 2018-02-02 | Disposition: A | Discharge status: Home / Self Care | Payer: Medicare Other | Source: Ambulatory Visit | Attending: Internal Medicine | Admitting: Internal Medicine

## 2018-02-04 ENCOUNTER — Non-Acute Institutional Stay (SKILLED_NURSING_FACILITY): Payer: Medicare Other | Admitting: Adult Health

## 2018-02-04 DIAGNOSIS — M792 Neuralgia and neuritis, unspecified: Secondary | ICD-10-CM

## 2018-02-04 DIAGNOSIS — I480 Paroxysmal atrial fibrillation: Secondary | ICD-10-CM | POA: Diagnosis not present

## 2018-02-09 ENCOUNTER — Other Ambulatory Visit
Admission: RE | Admit: 2018-02-09 | Discharge: 2018-02-09 | Disposition: A | Discharge status: Home / Self Care | Payer: Medicare Other | Source: Ambulatory Visit | Attending: Adult Health | Admitting: Adult Health

## 2018-02-09 DIAGNOSIS — I129 Hypertensive chronic kidney disease with stage 1 through stage 4 chronic kidney disease, or unspecified chronic kidney disease: Secondary | ICD-10-CM | POA: Insufficient documentation

## 2018-02-09 DIAGNOSIS — M6281 Muscle weakness (generalized): Secondary | ICD-10-CM | POA: Diagnosis not present

## 2018-02-09 DIAGNOSIS — J449 Chronic obstructive pulmonary disease, unspecified: Secondary | ICD-10-CM | POA: Diagnosis not present

## 2018-02-09 DIAGNOSIS — I69354 Hemiplegia and hemiparesis following cerebral infarction affecting left non-dominant side: Secondary | ICD-10-CM | POA: Diagnosis not present

## 2018-02-09 DIAGNOSIS — I482 Chronic atrial fibrillation, unspecified: Secondary | ICD-10-CM | POA: Diagnosis not present

## 2018-02-09 LAB — CBC WITH DIFFERENTIAL/PLATELET
Basophils Absolute: 0 10*3/uL (ref 0–0.1)
Basophils Relative: 1 %
EOS PCT: 3 %
Eosinophils Absolute: 0.2 10*3/uL (ref 0–0.7)
HCT: 33.1 % — ABNORMAL LOW (ref 35.0–47.0)
Hemoglobin: 11 g/dL — ABNORMAL LOW (ref 12.0–16.0)
LYMPHS PCT: 33 %
Lymphs Abs: 2.1 10*3/uL (ref 1.0–3.6)
MCH: 31.4 pg (ref 26.0–34.0)
MCHC: 33.4 g/dL (ref 32.0–36.0)
MCV: 94 fL (ref 80.0–100.0)
MONO ABS: 0.5 10*3/uL (ref 0.2–0.9)
Monocytes Relative: 7 %
Neutro Abs: 3.7 10*3/uL (ref 1.4–6.5)
Neutrophils Relative %: 56 %
PLATELETS: 165 10*3/uL (ref 150–440)
RBC: 3.52 MIL/uL — ABNORMAL LOW (ref 3.80–5.20)
RDW: 14.9 % — AB (ref 11.5–14.5)
WBC: 6.5 10*3/uL (ref 3.6–11.0)

## 2018-02-13 NOTE — Progress Notes (Signed)
This encounter was created in error - please disregard.

## 2018-02-14 DIAGNOSIS — M792 Neuralgia and neuritis, unspecified: Secondary | ICD-10-CM | POA: Insufficient documentation

## 2018-02-14 NOTE — Progress Notes (Signed)
Location:   [place] Service:  SNF (31)   CODE STATUS: DNR  Allergies  Allergen Reactions  . Iodinated Diagnostic Agents Itching and Swelling    Other reaction(s): Unknown  . Nitrofurantoin Diarrhea    Other reaction(s): Unknown  . Omnipaque [Iohexol]   . Solifenacin Other (See Comments)    indigestion  . Ciprofloxacin Other (See Comments) and Rash    Colitis  . Metronidazole Itching and Rash  . Sulfa Antibiotics Itching and Rash    Chief Complaint  Patient presents with  . Acute Visit    bruising     HPI:  She is noted to have excessive bruising on her arms and legs. She is also having increased day time lethargy. Her family is concerned that she is bruising too much and is sleeping too much in the daytime. She denies any fatigue; no uncontrolled pain; no changes in appetite.    Past Medical History:  Diagnosis Date  . Adenoma of rectum   . Adrenal adenoma   . Anemia   . Anemia, unspecified   . Arthritis   . Ascending aortic aneurysm ([place])    a. s/p repair 07/2014  . Atrophic vaginitis   . Benign neoplasm of colon, unspecified   . COPD (chronic obstructive pulmonary disease) ([place])   . Coronary artery disease, non-occlusive    a. LHC 06/2014 without evidence of obstructive disease  . Depression   . Diverticulosis   . Dysphagia   . Dysrhythmia   . Essential hypertension    benign  . Frequent UTI   . GERD (gastroesophageal reflux disease)   . Hemiplegia and hemiparesis ([place])    following cerebral infarction affecting left non-dominant side  . Hypertension   . Microscopic hematuria   . Nonrheumatic aortic valve insufficiency   . Osteoporosis    unspecified  . Panic attacks   . Paroxysmal atrial fibrillation ([facility])   . Pernicious anemia   . Renal cyst    CKD STAGE 3  . Stroke ([place]) 07/23/2014   a. post-operative setting in 07/2014 leading to left UE hemiapresis and left LE weakness  . Urge incontinence     Past Surgical  History:  Procedure Laterality Date  . ABDOMINAL HYSTERECTOMY  1972  . AORTOILIAC BYPASS    . ASCENDING AORTIC ANEURYSM REPAIR  07/23/2014   07/2014  . BLEPHAROPLASTY    . CATARACT EXTRACTION  2005  . GASTROSTOMY W/ FEEDING TUBE    . INTRAMEDULLARY (IM) NAIL INTERTROCHANTERIC Left 02/26/2016   Procedure: INTRAMEDULLARY (IM) NAIL INTERTROCHANTRIC;  Surgeon: Earnestine Leys, MD;  Location: [facility] ORS;  Service: Orthopedics;  Laterality: Left;  . KYPHOPLASTY N/A 08/26/2016   Procedure: KYPHOPLASTY T12;  Surgeon: Hessie Knows, MD;  Location: [facility] ORS;  Service: Orthopedics;  Laterality: N/A;  . LEFT HEART CATH  06/2014  . PR ASCEND AORTIC GRAFT INCL VALVE SUSPENSION  07/07/2014   Procedure: ASCENDING AORTA GRAFT, WITH CARDIOPULMONARY BYPASS, WITH OR WITHOUT VALVE SUSPENSION; Surgeon: Dwaine Deter, MD; Location: MAIN OR [facility]; Service: Cardiothoracic  . PR LAP, GASTROTOMY W/O TUBE CONSTR  08/08/2014   Procedure: LAPAROSCOPY, SURGICAL; GASTOSTOMY W/O CONSTRUCTION OF GASTRIC TUBE (EG, STAMM PROCEDURE)(SEPARATE PROCED); Surgeon: Fredrik Rigger, MD; Location: MAIN OR [facility]; Service: Gastrointestinal  . PR PLACE PERCUT GASTROSTOMY TUBE  08/08/2014   Procedure: UGI ENDO; W/DIRECTED PLCMT PERQ GASTROSTOMY TUBE; Surgeon: Fredrik Rigger, MD; Location: MAIN OR [facility]; Service: Gastrointestinal    Social History   Socioeconomic History  .  Marital status: Widowed    Spouse name: Not on file  . Number of children: 3  . Years of education: 56  . Highest education level: Not on file  Occupational History  . Not on file  Social Needs  . Financial resource strain: Not on file  . Food insecurity:    Worry: Not on file    Inability: Not on file  . Transportation needs:    Medical: Not on file    Non-medical: Not on file  Tobacco Use  . Smoking status: Former Smoker    Packs/day: 0.50    Years: 50.00    Pack years: 25.00    Types: Cigarettes    Last attempt to quit: 05/30/2014     Years since quitting: 3.7  . Smokeless tobacco: Never Used  Substance and Sexual Activity  . Alcohol use: No    Comment: occasionally  . Drug use: No  . Sexual activity: Not on file  Lifestyle  . Physical activity:    Days per week: Not on file    Minutes per session: Not on file  . Stress: Not on file  Relationships  . Social connections:    Talks on phone: Not on file    Gets together: Not on file    Attends religious service: Not on file    Active member of club or organization: Not on file    Attends meetings of clubs or organizations: Not on file    Relationship status: Not on file  . Intimate partner violence:    Fear of current or ex partner: Not on file    Emotionally abused: Not on file    Physically abused: Not on file    Forced sexual activity: Not on file  Other Topics Concern  . Not on file  Social History Narrative   Admitted to [place] 03/01/2016   Widowed   4 children   Former Smoker   Occasionally drinks   DNR   Family History  Problem Relation Age of Onset  . Lung cancer Son   . CAD Father   . Heart attack Father   . CAD Brother   . Heart attack Brother   . Stroke Mother       VITAL SIGNS BP 138/79   Pulse 82   Temp 97.8 F (36.6 C)   Resp 16   Ht 5\' 1"  (1.549 m)   Wt 141 lb (64 kg)   BMI 26.64 kg/m   Outpatient Encounter Medications as of 02/04/2018  Medication Sig  . acetaminophen (TYLENOL) 325 MG tablet Take 650 mg by mouth 4 (four) times daily.   Marland Kitchen albuterol (PROVENTIL HFA;VENTOLIN HFA) 108 (90 Base) MCG/ACT inhaler Inhale 2 puffs into the lungs every 4 (four) hours as needed for wheezing or shortness of breath.  Marland Kitchen alum & mag hydroxide-simeth (MAALOX PLUS) 400-400-40 MG/5ML suspension Take 30 mLs by mouth every 4 (four) hours as needed.  Marland Kitchen alum & mag hydroxide-simeth (MAALOX PLUS) 400-400-40 MG/5ML suspension Give 30 ml by mouth every 4 hours as needed  . apixaban (ELIQUIS) 2.5 MG TABS tablet Take 2.5 mg by mouth 2  (two) times daily.  Marland Kitchen atorvastatin (LIPITOR) 20 MG tablet Take 20 mg by mouth at bedtime.   Marland Kitchen BREO ELLIPTA 100-25 MCG/INH AEPB Inhale 1 puff into the lungs daily. Rinse mouth well after use  . carboxymethylcellulose (REFRESH TEARS) 0.5 % SOLN Place 2 drops into both eyes See admin instructions. 1 to 4 times daily  prn  . Cholecalciferol 4000 units CAPS Take 1 capsule by mouth daily.  Marland Kitchen dextromethorphan-guaiFENesin (MUCINEX DM) 30-600 MG 12hr tablet Take 1 tablet by mouth 2 (two) times daily.  . diclofenac sodium (VOLTAREN) 1 % GEL Apply 4 g topically 4 (four) times daily as needed (pain). To lumbar spine  . diltiazem (CARDIZEM) 60 MG tablet Take 60 mg by mouth daily.  Marland Kitchen docusate sodium (COLACE) 100 MG capsule Take 1 capsule (100 mg total) by mouth 2 (two) times daily.  . ferrous sulfate 325 (65 FE) MG tablet Take 325 mg by mouth daily with breakfast.  . fluticasone (FLONASE) 50 MCG/ACT nasal spray Place 1 spray into both nostrils daily.   Marland Kitchen gabapentin (NEURONTIN) 100 MG capsule Take 200 mg by mouth 3 (three) times daily. 2 caps  . guaiFENesin-codeine (ROBITUSSIN AC) 100-10 MG/5ML syrup Take 5 mLs by mouth every 6 (six) hours as needed for cough.  Marland Kitchen ipratropium-albuterol (DUONEB) 0.5-2.5 (3) MG/3ML SOLN Take 3 mLs by nebulization 3 (three) times daily.  . Lidocaine (ASPERCREME LIDOCAINE) 4 % PTCH Apply 2 patches topically daily. Apply one patch to Right Flank/sore ribs and one patch to the right forearm daily. Remove after 12 hours  . magnesium hydroxide (MILK OF MAGNESIA) 400 MG/5ML suspension Take 30 mLs by mouth daily as needed for mild constipation.  . methocarbamol (ROBAXIN) 500 MG tablet Take 250 mg by mouth 2 (two) times daily. 1/2 tablet  . metoCLOPramide (REGLAN) 5 MG tablet Give 1/2 tablet (2.5 mg) by mouth daily x 7 days then discontinue  . montelukast (SINGULAIR) 10 MG tablet Take 10 mg by mouth at bedtime.  Marland Kitchen oxycodone (OXY-IR) 5 MG capsule Take 5 mg by mouth every 4 (four) hours as  needed. For moderate or severe pain  . OXYGEN Inhale 2 L into the lungs continuous as needed.  . pantoprazole (PROTONIX) 40 MG tablet Take 40 mg by mouth daily.  Vladimir Faster Glycol-Propyl Glycol (SYSTANE) 0.4-0.3 % GEL ophthalmic gel Place 1 application into both eyes at bedtime. (2 drops ) for dry eyes (Ointments are on Backorder)  . polyethylene glycol (MIRALAX) packet Take 17 g by mouth 2 (two) times daily.  . potassium chloride SA (K-DUR,KLOR-CON) 20 MEQ tablet Take 20 mEq by mouth daily.   . predniSONE (DELTASONE) 10 MG tablet Take 10 mg by mouth daily with breakfast.  . ranitidine (ZANTAC) 75 MG tablet Take 75 mg by mouth 2 (two) times daily.   Marland Kitchen senna-docusate (SENNA-PLUS) 8.6-50 MG tablet Take 2 tablets by mouth 2 (two) times daily as needed.   . sertraline (ZOLOFT) 100 MG tablet Take 100 mg by mouth daily. 8 pm  . sertraline (ZOLOFT) 50 MG tablet Take 50 mg by mouth at bedtime. 8 pm  . Skin Protectants, Misc. (MINERIN) CREA Apply liberal amount daily to arms and legs after the bath and PRN for skin integrity  . sodium phosphate Pediatric (FLEET) 3.5-9.5 GM/59ML enema Place 1 enema rectally once as needed for severe constipation. Take once every 3 days if constipation is not relieved by Milk of magnesia or Bisacodyl Suppository  . torsemide (DEMADEX) 10 MG tablet Take 15 mg by mouth daily. 1 and 1/2 tab  . UNABLE TO FIND Diet Type: NAS  . vitamin B-12 (CYANOCOBALAMIN) 250 MCG tablet Take 250 mcg by mouth daily.   No facility-administered encounter medications on file as of 02/04/2018.      SIGNIFICANT DIAGNOSTIC EXAMS  LABS REVIEWED  02-09-18: wbc 6.5 hgb 11.0; hct 33.1; mcv  94.0 plt 165   Review of Systems  Constitutional: Negative for malaise/fatigue.  Respiratory: Negative for cough and shortness of breath.   Cardiovascular: Negative for chest pain, palpitations and leg swelling.  Gastrointestinal: Negative for abdominal pain, constipation and heartburn.  Musculoskeletal:  Negative for back pain, joint pain and myalgias.  Skin: Negative.   Neurological: Negative for dizziness.  Psychiatric/Behavioral: The patient is not nervous/anxious.     Physical Exam  Constitutional: She appears well-developed and well-nourished. No distress.  Neck: No thyromegaly present.  Cardiovascular: Normal rate, regular rhythm, normal heart sounds and intact distal pulses.  Pulmonary/Chest: Effort normal and breath sounds normal. No respiratory distress.  Abdominal: Soft. Bowel sounds are normal. She exhibits no distension. There is no tenderness.  Musculoskeletal: She exhibits no edema.  Unable to move left upper extremity does have splint Left lower extremity is weak Is able to move right extremities.   Lymphadenopathy:    She has no cervical adenopathy.  Neurological: She is alert.  Skin: Skin is warm and dry. She is not diaphoretic.  Does have excessive bruiting present on arms and legs   Psychiatric: She has a normal mood and affect.    ASSESSMENT/ PLAN:  TODAY:   1. Paroxysmal atrial fibrillation 2. Neuropathic pain  Will lower her neurontin to 200 mg twice daily  Will stop the eliquis due to excessive bruising and her advanced age Will stop the mucinex dm; vit b12; iron and flonase Will begin asa 81 mg daily       MD is aware of resident's narcotic use and is in agreement with current plan of care. We will attempt to wean resident as apropriate   Ok Edwards NP [facility]  Contact [phone] Monday through Friday 8am- 5pm  After hours call [phone]

## 2018-02-15 ENCOUNTER — Encounter: Payer: Self-pay | Admitting: Adult Health

## 2018-02-15 ENCOUNTER — Non-Acute Institutional Stay (SKILLED_NURSING_FACILITY): Payer: Medicare Other | Admitting: Adult Health

## 2018-02-15 DIAGNOSIS — D510 Vitamin B12 deficiency anemia due to intrinsic factor deficiency: Secondary | ICD-10-CM

## 2018-02-15 DIAGNOSIS — I480 Paroxysmal atrial fibrillation: Secondary | ICD-10-CM

## 2018-02-15 DIAGNOSIS — E876 Hypokalemia: Secondary | ICD-10-CM | POA: Diagnosis not present

## 2018-02-15 DIAGNOSIS — K219 Gastro-esophageal reflux disease without esophagitis: Secondary | ICD-10-CM | POA: Diagnosis not present

## 2018-02-15 DIAGNOSIS — N183 Chronic kidney disease, stage 3 unspecified: Secondary | ICD-10-CM

## 2018-02-15 DIAGNOSIS — I6930 Unspecified sequelae of cerebral infarction: Secondary | ICD-10-CM

## 2018-02-15 DIAGNOSIS — M792 Neuralgia and neuritis, unspecified: Secondary | ICD-10-CM | POA: Diagnosis not present

## 2018-02-15 DIAGNOSIS — M545 Low back pain: Secondary | ICD-10-CM

## 2018-02-15 DIAGNOSIS — F325 Major depressive disorder, single episode, in full remission: Secondary | ICD-10-CM

## 2018-02-15 DIAGNOSIS — G8929 Other chronic pain: Secondary | ICD-10-CM

## 2018-02-15 DIAGNOSIS — J449 Chronic obstructive pulmonary disease, unspecified: Secondary | ICD-10-CM | POA: Diagnosis not present

## 2018-02-15 DIAGNOSIS — I129 Hypertensive chronic kidney disease with stage 1 through stage 4 chronic kidney disease, or unspecified chronic kidney disease: Secondary | ICD-10-CM | POA: Diagnosis not present

## 2018-02-15 DIAGNOSIS — K5909 Other constipation: Secondary | ICD-10-CM

## 2018-02-15 NOTE — Progress Notes (Signed)
Location:   [facility] Room Number: [place] of Service:  SNF (31)   CODE STATUS: DNR  Allergies  Allergen Reactions  . Iodinated Diagnostic Agents Itching and Swelling    Other reaction(s): Unknown  . Nitrofurantoin Diarrhea    Other reaction(s): Unknown  . Omnipaque [Iohexol]   . Solifenacin Other (See Comments)    indigestion  . Ciprofloxacin Other (See Comments) and Rash    Colitis  . Metronidazole Itching and Rash  . Sulfa Antibiotics Itching and Rash    Chief Complaint  Patient presents with  . Medical Management of Chronic Issues    Paroxysmal atrial fibrillation; chronic obstructive pulmonary disease unspecified COPD type; gastroesophageal disease without esophagitis.     HPI:  She is a 84 year old long term resident of this facility being seen for the management of her chronic illnesses: afib; copd; gerd. She denies any chest pain or palpitations; denies any heart burn or uncontrolled pain.    Past Medical History:  Diagnosis Date  . Adenoma of rectum   . Adrenal adenoma   . Anemia   . Anemia, unspecified   . Arthritis   . Ascending aortic aneurysm ([place])    a. s/p repair 07/2014  . Atrophic vaginitis   . Benign neoplasm of colon, unspecified   . COPD (chronic obstructive pulmonary disease) ([place])   . Coronary artery disease, non-occlusive    a. LHC 06/2014 without evidence of obstructive disease  . Depression   . Diverticulosis   . Dysphagia   . Dysrhythmia   . Essential hypertension    benign  . Frequent UTI   . GERD (gastroesophageal reflux disease)   . Hemiplegia and hemiparesis    following cerebral infarction affecting left non-dominant side  . Hypertension   . Microscopic hematuria   . Nonrheumatic aortic valve insufficiency   . Osteoporosis    unspecified  . Panic attacks   . Paroxysmal atrial fibrillation (H[facility])   . Pernicious anemia   . Renal cyst    CKD STAGE 3  . Stroke ([place]) 07/23/2014   a.  post-operative setting in 07/2014 leading to left UE hemiapresis and left LE weakness  . Urge incontinence     Past Surgical History:  Procedure Laterality Date  . ABDOMINAL HYSTERECTOMY  1972  . AORTOILIAC BYPASS    . ASCENDING AORTIC ANEURYSM REPAIR  07/23/2014   07/2014  . BLEPHAROPLASTY    . CATARACT EXTRACTION  2005  . GASTROSTOMY W/ FEEDING TUBE    . INTRAMEDULLARY (IM) NAIL INTERTROCHANTERIC Left 02/26/2016   Procedure: INTRAMEDULLARY (IM) NAIL INTERTROCHANTRIC;  Surgeon: Earnestine Leys, MD;  Location: [facility] ORS;  Service: Orthopedics;  Laterality: Left;  . KYPHOPLASTY N/A 08/26/2016   Procedure: KYPHOPLASTY T12;  Surgeon: Hessie Knows, MD;  Location: [facility] ORS;  Service: Orthopedics;  Laterality: N/A;  . LEFT HEART CATH  06/2014  . PR ASCEND AORTIC GRAFT INCL VALVE SUSPENSION  07/07/2014   Procedure: ASCENDING AORTA GRAFT, WITH CARDIOPULMONARY BYPASS, WITH OR WITHOUT VALVE SUSPENSION; Surgeon: Dwaine Deter, MD; Location: MAIN OR [facility]; Service: Cardiothoracic  . PR LAP, GASTROTOMY W/O TUBE CONSTR  08/08/2014   Procedure: LAPAROSCOPY, SURGICAL; GASTOSTOMY W/O CONSTRUCTION OF GASTRIC TUBE (EG, STAMM PROCEDURE)(SEPARATE PROCED); Surgeon: Fredrik Rigger, MD; Location: MAIN OR [facility]; Service: Gastrointestinal  . PR PLACE PERCUT GASTROSTOMY TUBE  08/08/2014   Procedure: UGI ENDO; W/DIRECTED PLCMT PERQ GASTROSTOMY TUBE; Surgeon: Fredrik Rigger, MD; Location: MAIN OR [facility]; Service: Gastrointestinal  Social History   Socioeconomic History  . Marital status: Widowed    Spouse name: Not on file  . Number of children: 3  . Years of education: 91  . Highest education level: Not on file  Occupational History  . Not on file  Social Needs  . Financial resource strain: Not on file  . Food insecurity:    Worry: Not on file    Inability: Not on file  . Transportation needs:    Medical: Not on file    Non-medical: Not on file  Tobacco Use  . Smoking status: Former  Smoker    Packs/day: 0.50    Years: 50.00    Pack years: 25.00    Types: Cigarettes    Last attempt to quit: 05/30/2014    Years since quitting: 3.7  . Smokeless tobacco: Never Used  Substance and Sexual Activity  . Alcohol use: No    Comment: occasionally  . Drug use: No  . Sexual activity: Not on file  Lifestyle  . Physical activity:    Days per week: Not on file    Minutes per session: Not on file  . Stress: Not on file  Relationships  . Social connections:    Talks on phone: Not on file    Gets together: Not on file    Attends religious service: Not on file    Active member of club or organization: Not on file    Attends meetings of clubs or organizations: Not on file    Relationship status: Not on file  . Intimate partner violence:    Fear of current or ex partner: Not on file    Emotionally abused: Not on file    Physically abused: Not on file    Forced sexual activity: Not on file  Other Topics Concern  . Not on file  Social History Narrative   Admitted to [place] 03/01/2016   Widowed   4 children   Former Smoker   Occasionally drinks   DNR   Family History  Problem Relation Age of Onset  . Lung cancer Son   . CAD Father   . Heart attack Father   . CAD Brother   . Heart attack Brother   . Stroke Mother       VITAL SIGNS BP (!) 143/64   Pulse 74   Temp (!) 97 F (36.1 C)   Resp 20   Ht 5\' 1"  (1.549 m)   Wt 138 lb 9.6 oz (62.9 kg)   SpO2 100%   BMI 26.19 kg/m   Outpatient Encounter Medications as of 02/15/2018  Medication Sig  . acetaminophen (TYLENOL) 325 MG tablet Take 650 mg by mouth 4 (four) times daily.   Marland Kitchen albuterol (PROVENTIL HFA;VENTOLIN HFA) 108 (90 Base) MCG/ACT inhaler Inhale 2 puffs into the lungs every 4 (four) hours as needed for wheezing or shortness of breath.  Marland Kitchen alum & mag hydroxide-simeth (MAALOX PLUS) 400-400-40 MG/5ML suspension Take 30 mLs by mouth every 4 (four) hours as needed.  Marland Kitchen aspirin 81 MG chewable  tablet Chew 81 mg by mouth daily.  Marland Kitchen BREO ELLIPTA 100-25 MCG/INH AEPB Inhale 1 puff into the lungs daily. Rinse mouth well after use  . carboxymethylcellulose (REFRESH TEARS) 0.5 % SOLN Place 2 drops into both eyes See admin instructions. 1 to 4 times daily prn  . Cholecalciferol 4000 units CAPS Take 1 capsule by mouth daily.  . diclofenac sodium (VOLTAREN) 1 % GEL Apply 4 g  topically 4 (four) times daily as needed (pain). To lumbar spine  . diltiazem (CARDIZEM) 60 MG tablet Take 60 mg by mouth daily.  Marland Kitchen docusate sodium (COLACE) 100 MG capsule Take 1 capsule (100 mg total) by mouth 2 (two) times daily.  Marland Kitchen gabapentin (NEURONTIN) 100 MG capsule Take 200 mg by mouth 2 (two) times daily. 2 caps  . ipratropium-albuterol (DUONEB) 0.5-2.5 (3) MG/3ML SOLN Take 3 mLs by nebulization 3 (three) times daily.  . Lidocaine (ASPERCREME LIDOCAINE) 4 % PTCH Apply 2 patches topically daily. Apply one patch to Right Flank/sore ribs and one patch to the right forearm daily. Remove after 12 hours  . magnesium hydroxide (MILK OF MAGNESIA) 400 MG/5ML suspension Take 30 mLs by mouth daily as needed for mild constipation.  . methocarbamol (ROBAXIN) 500 MG tablet Take 250 mg by mouth 2 (two) times daily. 1/2 tablet  . montelukast (SINGULAIR) 10 MG tablet Take 10 mg by mouth at bedtime.  Marland Kitchen oxycodone (OXY-IR) 5 MG capsule Take 5 mg by mouth every 4 (four) hours as needed. For moderate or severe pain  . OXYGEN Inhale 2 L into the lungs continuous as needed.  . pantoprazole (PROTONIX) 40 MG tablet Take 40 mg by mouth daily.  Vladimir Faster Glycol-Propyl Glycol (SYSTANE) 0.4-0.3 % GEL ophthalmic gel Place 1 application into both eyes at bedtime. (2 drops ) for dry eyes (Ointments are on Backorder)  . polyethylene glycol (MIRALAX) packet Take 17 g by mouth 2 (two) times daily.  . potassium chloride SA (K-DUR,KLOR-CON) 20 MEQ tablet Take 20 mEq by mouth daily.   . predniSONE (DELTASONE) 10 MG tablet Take 10 mg by mouth daily with  breakfast.  . ranitidine (ZANTAC) 75 MG tablet Take 75 mg by mouth 2 (two) times daily.   Marland Kitchen senna-docusate (SENNA-PLUS) 8.6-50 MG tablet Take 2 tablets by mouth 2 (two) times daily as needed.   . sertraline (ZOLOFT) 100 MG tablet Take 100 mg by mouth daily. 8 pm  . sertraline (ZOLOFT) 50 MG tablet Take 50 mg by mouth at bedtime. 8 pm  . Skin Protectants, Misc. (MINERIN) CREA Apply liberal amount daily to arms and legs after the bath and PRN for skin integrity  . sodium phosphate Pediatric (FLEET) 3.5-9.5 GM/59ML enema Place 1 enema rectally once as needed for severe constipation. Take once every 3 days if constipation is not relieved by Milk of magnesia or Bisacodyl Suppository  . torsemide (DEMADEX) 10 MG tablet Take 15 mg by mouth daily. 1 and 1/2 tab  . UNABLE TO FIND Diet Type: NAS  . alum & mag hydroxide-simeth (MAALOX PLUS) 400-400-40 MG/5ML suspension Give 30 ml by mouth every 4 hours as needed  . apixaban (ELIQUIS) 2.5 MG TABS tablet Take 2.5 mg by mouth 2 (two) times daily.  Marland Kitchen atorvastatin (LIPITOR) 20 MG tablet Take 20 mg by mouth at bedtime.   Marland Kitchen dextromethorphan-guaiFENesin (MUCINEX DM) 30-600 MG 12hr tablet Take 1 tablet by mouth 2 (two) times daily.  . ferrous sulfate 325 (65 FE) MG tablet Take 325 mg by mouth daily with breakfast.  . fluticasone (FLONASE) 50 MCG/ACT nasal spray Place 1 spray into both nostrils daily.   Marland Kitchen guaiFENesin-codeine (ROBITUSSIN AC) 100-10 MG/5ML syrup Take 5 mLs by mouth every 6 (six) hours as needed for cough.   No facility-administered encounter medications on file as of 02/15/2018.      SIGNIFICANT DIAGNOSTIC EXAMS  LABS REVIEWED PREVIOUS   02-09-18: wbc 6.5 hgb 11.0; hct 33.1; mcv 94.0 plt 165  NO NEW LABS.    Review of Systems  Constitutional: Negative for malaise/fatigue.  Respiratory: Negative for cough and shortness of breath.   Cardiovascular: Negative for chest pain, palpitations and leg swelling.  Gastrointestinal: Negative for  abdominal pain, constipation and heartburn.  Musculoskeletal: Negative for back pain, joint pain and myalgias.  Skin: Negative.   Neurological: Negative for dizziness.  Psychiatric/Behavioral: The patient is not nervous/anxious.     Physical Exam  Constitutional: She appears well-developed and well-nourished. No distress.  Neck: No thyromegaly present.  Cardiovascular: Normal rate, regular rhythm, normal heart sounds and intact distal pulses.  Pulmonary/Chest: Effort normal and breath sounds normal. No respiratory distress.  Abdominal: Soft. Bowel sounds are normal. She exhibits no distension. There is no tenderness.  Musculoskeletal: She exhibits no edema.  Unable to move left upper extremity does have splint Left lower extremity is weak Is able to move right extremities.    Lymphadenopathy:    She has no cervical adenopathy.  Neurological: She is alert.  Skin: Skin is warm and dry. She is not diaphoretic.  Has excessive bruising present.   Psychiatric: She has a normal mood and affect.     ASSESSMENT/ PLAN:  TODAY:   1. Paroxysmal atrial fibrillation: heart rate is stable: will continue asa 81 mg daily cardizem 60 mg daily for rate control. Will stop her eliquis at this time.   2. Unspecified type of chronic obstructive pulmonary disease (COPD): is stable will continue breo inhaler daily; duoneb three times daily albuterol inhaler 2 puffs every 4 hours as needed has 02 as needed and flonase daily   3. GERD without esophagitis: is stable will continue protonix 40 mg daily and zantac 75 mg twice daily   4. Chronic constipation: is stable will continue miralax twice daily colace twice daily   5. Hypertensive chronic kidney disease w stg 1-4/unsp chr kdny: is stable b/p 143/64: will monitor   6. Chronic kidney disease stage III (moderate): is stable will monitor  7. 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  Neuropathic pain: is under control: will continue neurontin 200 mg twice daily   9. Chronic bilateral low back pain without sciatica: is stable will continue tylenol 650 mg four times daily robaxin 250 mg twice daily lidoderm 4% pack to right flank and right arm daily; has voltaren gel 4 gm to lower back four times as needed;   10. 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 Hypokalemia: is stable will continue k+ 20 meq daily   12. Bilateral lower extremity edema: is stable will lower demadex to 10 mg daily   13. Vitamin B12 deficiency anemia due to intrinsic factor deficiency: is stable will stop iron and will continue vit B 12  14. Dyslipidemia: stable will stop lipitor   Will stop vit D   MD is aware of resident's narcotic use and is in agreement with current plan of care. We will attempt to wean resident as apropriate   Ok Edwards NP [facility]  Contact [phone] Monday through Friday 8am- 5pm  After hours call [phone]

## 2018-02-19 ENCOUNTER — Other Ambulatory Visit: Payer: Self-pay

## 2018-02-19 MED ORDER — OXYCODONE HCL 5 MG PO CAPS: 5.0000 mg | ORAL_CAPSULE | ORAL | 0 refills | Status: DC | PRN

## 2018-02-21 DIAGNOSIS — E876 Hypokalemia: Secondary | ICD-10-CM | POA: Insufficient documentation

## 2018-02-21 DIAGNOSIS — Z8673 Personal history of transient ischemic attack (TIA), and cerebral infarction without residual deficits: Secondary | ICD-10-CM | POA: Insufficient documentation

## 2018-02-21 DIAGNOSIS — G8929 Other chronic pain: Secondary | ICD-10-CM | POA: Insufficient documentation

## 2018-02-21 DIAGNOSIS — K5909 Other constipation: Secondary | ICD-10-CM | POA: Insufficient documentation

## 2018-02-21 DIAGNOSIS — I6930 Unspecified sequelae of cerebral infarction: Secondary | ICD-10-CM | POA: Insufficient documentation

## 2018-02-21 DIAGNOSIS — M549 Dorsalgia, unspecified: Secondary | ICD-10-CM

## 2018-02-24 ENCOUNTER — Other Ambulatory Visit: Payer: Self-pay

## 2018-02-24 MED ORDER — OXYCODONE HCL 5 MG PO CAPS: 5.0000 mg | ORAL_CAPSULE | ORAL | 0 refills | Status: DC | PRN

## 2018-02-24 NOTE — Telephone Encounter (Signed)
Rx sent to [facility] phone : [phone] , fax : [phone]  

## 2018-03-03 DIAGNOSIS — M6281 Muscle weakness (generalized): Secondary | ICD-10-CM | POA: Diagnosis not present

## 2018-03-03 DIAGNOSIS — I69354 Hemiplegia and hemiparesis following cerebral infarction affecting left non-dominant side: Secondary | ICD-10-CM | POA: Diagnosis not present

## 2018-03-03 DIAGNOSIS — I482 Chronic atrial fibrillation, unspecified: Secondary | ICD-10-CM | POA: Diagnosis not present

## 2018-03-03 DIAGNOSIS — J449 Chronic obstructive pulmonary disease, unspecified: Secondary | ICD-10-CM | POA: Diagnosis not present

## 2018-03-04 ENCOUNTER — Other Ambulatory Visit
Admission: RE | Admit: 2018-03-04 | Discharge: 2018-03-04 | Disposition: A | Discharge status: Home / Self Care | Payer: Medicare Other | Source: Skilled Nursing Facility | Attending: Adult Health | Admitting: Adult Health

## 2018-03-04 DIAGNOSIS — J449 Chronic obstructive pulmonary disease, unspecified: Secondary | ICD-10-CM | POA: Diagnosis not present

## 2018-03-04 DIAGNOSIS — I69354 Hemiplegia and hemiparesis following cerebral infarction affecting left non-dominant side: Secondary | ICD-10-CM | POA: Diagnosis not present

## 2018-03-04 DIAGNOSIS — Z7901 Long term (current) use of anticoagulants: Secondary | ICD-10-CM | POA: Insufficient documentation

## 2018-03-04 DIAGNOSIS — I482 Chronic atrial fibrillation, unspecified: Secondary | ICD-10-CM | POA: Diagnosis not present

## 2018-03-04 DIAGNOSIS — M6281 Muscle weakness (generalized): Secondary | ICD-10-CM | POA: Diagnosis not present

## 2018-03-04 LAB — CBC WITH DIFFERENTIAL/PLATELET
Abs Immature Granulocytes: 0.1 10*3/uL — ABNORMAL HIGH (ref 0.00–0.07)
BASOS PCT: 0 %
Basophils Absolute: 0 10*3/uL (ref 0.0–0.1)
EOS PCT: 2 %
Eosinophils Absolute: 0.1 10*3/uL (ref 0.0–0.5)
HCT: 32.5 % — ABNORMAL LOW (ref 36.0–46.0)
Hemoglobin: 10.3 g/dL — ABNORMAL LOW (ref 12.0–15.0)
IMMATURE GRANULOCYTES: 2 %
Lymphocytes Relative: 32 %
Lymphs Abs: 2.1 10*3/uL (ref 0.7–4.0)
MCH: 30.6 pg (ref 26.0–34.0)
MCHC: 31.7 g/dL (ref 30.0–36.0)
MCV: 96.4 fL (ref 80.0–100.0)
MONO ABS: 0.5 10*3/uL (ref 0.1–1.0)
MONOS PCT: 7 %
Neutro Abs: 3.7 10*3/uL (ref 1.7–7.7)
Neutrophils Relative %: 57 %
Platelets: 178 10*3/uL (ref 150–400)
RBC: 3.37 MIL/uL — AB (ref 3.87–5.11)
RDW: 13.4 % (ref 11.5–15.5)
WBC: 6.6 10*3/uL (ref 4.0–10.5)
nRBC: 0 % (ref 0.0–0.2)

## 2018-03-04 LAB — BASIC METABOLIC PANEL
Anion gap: 6 (ref 5–15)
BUN: 30 mg/dL — ABNORMAL HIGH (ref 8–23)
CALCIUM: 8.5 mg/dL — AB (ref 8.9–10.3)
CO2: 30 mmol/L (ref 22–32)
CREATININE: 1.27 mg/dL — AB (ref 0.44–1.00)
Chloride: 105 mmol/L (ref 98–111)
GFR calc Af Amer: 42 mL/min — ABNORMAL LOW (ref >60)
GFR, EST NON AFRICAN AMERICAN: 36 mL/min — AB (ref >60)
Glucose, Bld: 88 mg/dL (ref 70–99)
Potassium: 4.9 mmol/L (ref 3.5–5.1)
SODIUM: 141 mmol/L (ref 135–145)

## 2018-03-05 ENCOUNTER — Encounter
Admission: RE | Admit: 2018-03-05 | Discharge: 2018-03-05 | Disposition: A | Discharge status: Home / Self Care | Payer: Medicare Other | Source: Ambulatory Visit | Attending: Internal Medicine | Admitting: Internal Medicine

## 2018-03-05 DIAGNOSIS — L820 Inflamed seborrheic keratosis: Secondary | ICD-10-CM | POA: Diagnosis not present

## 2018-03-05 DIAGNOSIS — L538 Other specified erythematous conditions: Secondary | ICD-10-CM | POA: Diagnosis not present

## 2018-03-05 DIAGNOSIS — L821 Other seborrheic keratosis: Secondary | ICD-10-CM | POA: Diagnosis not present

## 2018-03-05 DIAGNOSIS — I482 Chronic atrial fibrillation, unspecified: Secondary | ICD-10-CM | POA: Diagnosis not present

## 2018-03-05 DIAGNOSIS — I69354 Hemiplegia and hemiparesis following cerebral infarction affecting left non-dominant side: Secondary | ICD-10-CM | POA: Diagnosis not present

## 2018-03-05 DIAGNOSIS — M6281 Muscle weakness (generalized): Secondary | ICD-10-CM | POA: Diagnosis not present

## 2018-03-05 DIAGNOSIS — J449 Chronic obstructive pulmonary disease, unspecified: Secondary | ICD-10-CM | POA: Diagnosis not present

## 2018-03-08 DIAGNOSIS — J449 Chronic obstructive pulmonary disease, unspecified: Secondary | ICD-10-CM | POA: Diagnosis not present

## 2018-03-08 DIAGNOSIS — I482 Chronic atrial fibrillation, unspecified: Secondary | ICD-10-CM | POA: Diagnosis not present

## 2018-03-08 DIAGNOSIS — I69354 Hemiplegia and hemiparesis following cerebral infarction affecting left non-dominant side: Secondary | ICD-10-CM | POA: Diagnosis not present

## 2018-03-08 DIAGNOSIS — M6281 Muscle weakness (generalized): Secondary | ICD-10-CM | POA: Diagnosis not present

## 2018-03-09 DIAGNOSIS — J449 Chronic obstructive pulmonary disease, unspecified: Secondary | ICD-10-CM | POA: Diagnosis not present

## 2018-03-09 DIAGNOSIS — I69354 Hemiplegia and hemiparesis following cerebral infarction affecting left non-dominant side: Secondary | ICD-10-CM | POA: Diagnosis not present

## 2018-03-09 DIAGNOSIS — M6281 Muscle weakness (generalized): Secondary | ICD-10-CM | POA: Diagnosis not present

## 2018-03-09 DIAGNOSIS — I482 Chronic atrial fibrillation, unspecified: Secondary | ICD-10-CM | POA: Diagnosis not present

## 2018-03-10 DIAGNOSIS — I69354 Hemiplegia and hemiparesis following cerebral infarction affecting left non-dominant side: Secondary | ICD-10-CM | POA: Diagnosis not present

## 2018-03-10 DIAGNOSIS — I482 Chronic atrial fibrillation, unspecified: Secondary | ICD-10-CM | POA: Diagnosis not present

## 2018-03-10 DIAGNOSIS — M6281 Muscle weakness (generalized): Secondary | ICD-10-CM | POA: Diagnosis not present

## 2018-03-10 DIAGNOSIS — J449 Chronic obstructive pulmonary disease, unspecified: Secondary | ICD-10-CM | POA: Diagnosis not present

## 2018-03-12 DIAGNOSIS — J449 Chronic obstructive pulmonary disease, unspecified: Secondary | ICD-10-CM | POA: Diagnosis not present

## 2018-03-12 DIAGNOSIS — M6281 Muscle weakness (generalized): Secondary | ICD-10-CM | POA: Diagnosis not present

## 2018-03-12 DIAGNOSIS — I482 Chronic atrial fibrillation, unspecified: Secondary | ICD-10-CM | POA: Diagnosis not present

## 2018-03-12 DIAGNOSIS — I69354 Hemiplegia and hemiparesis following cerebral infarction affecting left non-dominant side: Secondary | ICD-10-CM | POA: Diagnosis not present

## 2018-03-15 DIAGNOSIS — M6281 Muscle weakness (generalized): Secondary | ICD-10-CM | POA: Diagnosis not present

## 2018-03-15 DIAGNOSIS — I482 Chronic atrial fibrillation, unspecified: Secondary | ICD-10-CM | POA: Diagnosis not present

## 2018-03-15 DIAGNOSIS — J449 Chronic obstructive pulmonary disease, unspecified: Secondary | ICD-10-CM | POA: Diagnosis not present

## 2018-03-15 DIAGNOSIS — I69354 Hemiplegia and hemiparesis following cerebral infarction affecting left non-dominant side: Secondary | ICD-10-CM | POA: Diagnosis not present

## 2018-03-16 DIAGNOSIS — I482 Chronic atrial fibrillation, unspecified: Secondary | ICD-10-CM | POA: Diagnosis not present

## 2018-03-16 DIAGNOSIS — I69354 Hemiplegia and hemiparesis following cerebral infarction affecting left non-dominant side: Secondary | ICD-10-CM | POA: Diagnosis not present

## 2018-03-16 DIAGNOSIS — M6281 Muscle weakness (generalized): Secondary | ICD-10-CM | POA: Diagnosis not present

## 2018-03-16 DIAGNOSIS — J449 Chronic obstructive pulmonary disease, unspecified: Secondary | ICD-10-CM | POA: Diagnosis not present

## 2018-03-17 DIAGNOSIS — M6281 Muscle weakness (generalized): Secondary | ICD-10-CM | POA: Diagnosis not present

## 2018-03-17 DIAGNOSIS — J449 Chronic obstructive pulmonary disease, unspecified: Secondary | ICD-10-CM | POA: Diagnosis not present

## 2018-03-17 DIAGNOSIS — I69354 Hemiplegia and hemiparesis following cerebral infarction affecting left non-dominant side: Secondary | ICD-10-CM | POA: Diagnosis not present

## 2018-03-17 DIAGNOSIS — I482 Chronic atrial fibrillation, unspecified: Secondary | ICD-10-CM | POA: Diagnosis not present

## 2018-03-18 ENCOUNTER — Encounter: Payer: Self-pay | Admitting: Adult Health

## 2018-03-18 ENCOUNTER — Non-Acute Institutional Stay (SKILLED_NURSING_FACILITY): Payer: Medicare Other | Admitting: Adult Health

## 2018-03-18 DIAGNOSIS — I69354 Hemiplegia and hemiparesis following cerebral infarction affecting left non-dominant side: Secondary | ICD-10-CM

## 2018-03-18 DIAGNOSIS — N183 Chronic kidney disease, stage 3 unspecified: Secondary | ICD-10-CM

## 2018-03-18 DIAGNOSIS — J449 Chronic obstructive pulmonary disease, unspecified: Secondary | ICD-10-CM | POA: Diagnosis not present

## 2018-03-18 DIAGNOSIS — I129 Hypertensive chronic kidney disease with stage 1 through stage 4 chronic kidney disease, or unspecified chronic kidney disease: Secondary | ICD-10-CM | POA: Diagnosis not present

## 2018-03-18 DIAGNOSIS — I480 Paroxysmal atrial fibrillation: Secondary | ICD-10-CM

## 2018-03-18 DIAGNOSIS — I6930 Unspecified sequelae of cerebral infarction: Secondary | ICD-10-CM

## 2018-03-18 NOTE — Progress Notes (Signed)
Provider:  Ok Edwards, NP Location:  [facility] Room Number: [place] of Service:  SNF (31)   PCP: Juluis Pitch, MD Patient Care Team: Juluis Pitch, MD as PCP - General (Family Medicine) Gerlene Fee, NP as Nurse Practitioner (Geriatric Medicine)  Extended Emergency Contact Information Primary Emergency Contact: [name] Address: [address]          [place], [place] [address] [name] of [place] Phone: [phone] Relation: Daughter Secondary Emergency Contact: Lingerfelt,Brad Address: [address] [place], [place] [address] [name] of [place] Phone: [phone] Relation: Grandson  Code Status: DNR Goals of Care: Advanced Directive information Advanced Directives 03/18/2018  Does Patient Have a Medical Advance Directive? Yes  Type of Advance Directive Out of facility DNR (pink MOST or yellow form)  Does patient want to make changes to medical advance directive? No - Patient declined  Would patient like information on creating a medical advance directive? -  Pre-existing out of facility DNR order (yellow form or pink MOST form) Yellow form placed in chart (order not valid for inpatient use)      Allergies  Allergen Reactions  . Iodinated Diagnostic Agents Itching and Swelling    Other reaction(s): Unknown  . Nitrofurantoin Diarrhea    Other reaction(s): Unknown  . Omnipaque [Iohexol]   . Solifenacin Other (See Comments)    indigestion  . Ciprofloxacin Other (See Comments) and Rash    Colitis  . Metronidazole Itching and Rash  . Sulfa Antibiotics Itching and Rash     Chief Complaint  Patient presents with  . Annual Exam        HPI: Patient is a 84 y.o. female seen today for an annual comprehensive examination. She has not had any recent hospitalizations.  Her status has remained stable over the past several months. She denies any uncontrolled pain; no anxiety no depressive  thoughts. No changes in appetite. She will continue to be followed for her chronic illnesses including: cva; faib; copd.   Past Medical History:  Diagnosis Date  . Adenoma of rectum   . Adrenal adenoma   . Anemia   . Anemia, unspecified   . Arthritis   . Ascending aortic aneurysm ([place])    a. s/p repair 07/2014  . Atrophic vaginitis   . Benign neoplasm of colon, unspecified   . COPD (chronic obstructive pulmonary disease) ([place])   . Coronary artery disease, non-occlusive    a. LHC 06/2014 without evidence of obstructive disease  . Depression   . Diverticulosis   . Dysphagia   . Dysrhythmia   . Essential hypertension    benign  . Frequent UTI   . GERD (gastroesophageal reflux disease)   . Hemiplegia and hemiparesis    following cerebral infarction affecting left non-dominant side  . Hypertension   . Microscopic hematuria   . Nonrheumatic aortic valve insufficiency   . Osteoporosis    unspecified  . Panic attacks   . Paroxysmal atrial fibrillation ([facility])   . Pernicious anemia   . Renal cyst    CKD STAGE 3  . Stroke ([place]) 07/23/2014   a. post-operative setting in 07/2014 leading to left UE hemiapresis and left LE weakness  . Urge incontinence    Past Surgical History:  Procedure Laterality Date  . ABDOMINAL HYSTERECTOMY  1972  . AORTOILIAC BYPASS    . ASCENDING AORTIC ANEURYSM REPAIR  07/23/2014  07/2014  . BLEPHAROPLASTY    . CATARACT EXTRACTION  2005  . GASTROSTOMY W/ FEEDING TUBE    . INTRAMEDULLARY (IM) NAIL INTERTROCHANTERIC Left 02/26/2016   Procedure: INTRAMEDULLARY (IM) NAIL INTERTROCHANTRIC;  Surgeon: Earnestine Leys, MD;  Location: [facility] ORS;  Service: Orthopedics;  Laterality: Left;  . KYPHOPLASTY N/A 08/26/2016   Procedure: KYPHOPLASTY T12;  Surgeon: Hessie Knows, MD;  Location: [facility] ORS;  Service: Orthopedics;  Laterality: N/A;  . LEFT HEART CATH  06/2014  . PR ASCEND AORTIC GRAFT INCL VALVE SUSPENSION  07/07/2014   Procedure: ASCENDING AORTA GRAFT, WITH  CARDIOPULMONARY BYPASS, WITH OR WITHOUT VALVE SUSPENSION; Surgeon: Dwaine Deter, MD; Location: MAIN OR [facility]; Service: Cardiothoracic  . PR LAP, GASTROTOMY W/O TUBE CONSTR  08/08/2014   Procedure: LAPAROSCOPY, SURGICAL; GASTOSTOMY W/O CONSTRUCTION OF GASTRIC TUBE (EG, STAMM PROCEDURE)(SEPARATE PROCED); Surgeon: Fredrik Rigger, MD; Location: MAIN OR [facility]; Service: Gastrointestinal  . PR PLACE PERCUT GASTROSTOMY TUBE  08/08/2014   Procedure: UGI ENDO; W/DIRECTED PLCMT PERQ GASTROSTOMY TUBE; Surgeon: Fredrik Rigger, MD; Location: MAIN OR [facility]; Service: Gastrointestinal    reports that she quit smoking about 3 years ago. Her smoking use included cigarettes. She has a 25.00 pack-year smoking history. She has never used smokeless tobacco. She reports that she does not drink alcohol or use drugs. Social History   Socioeconomic History  . Marital status: Widowed    Spouse name: Not on file  . Number of children: 3  . Years of education: 44  . Highest education level: Not on file  Occupational History  . Not on file  Social Needs  . Financial resource strain: Not on file  . Food insecurity:    Worry: Not on file    Inability: Not on file  . Transportation needs:    Medical: Not on file    Non-medical: Not on file  Tobacco Use  . Smoking status: Former Smoker    Packs/day: 0.50    Years: 50.00    Pack years: 25.00    Types: Cigarettes    Last attempt to quit: 05/30/2014    Years since quitting: 3.8  . Smokeless tobacco: Never Used  Substance and Sexual Activity  . Alcohol use: No    Comment: occasionally  . Drug use: No  . Sexual activity: Not on file  Lifestyle  . Physical activity:    Days per week: Not on file    Minutes per session: Not on file  . Stress: Not on file  Relationships  . Social connections:    Talks on phone: Not on file    Gets together: Not on file    Attends religious service: Not on file    Active member of club or organization: Not on  file    Attends meetings of clubs or organizations: Not on file    Relationship status: Not on file  . Intimate partner violence:    Fear of current or ex partner: Not on file    Emotionally abused: Not on file    Physically abused: Not on file    Forced sexual activity: Not on file  Other Topics Concern  . Not on file  Social History Narrative   Admitted to [facility] 03/01/2016   Widowed   4 children   Former Smoker   Occasionally drinks   DNR   Family History  Problem Relation Age of Onset  . Lung cancer Son   . CAD Father   . Heart attack Father   .  CAD Brother   . Heart attack Brother   . Stroke Mother     Vitals:   03/18/18 1452  BP: (!) 146/55  Pulse: 73  Resp: 18  Temp: 97.8 F (36.6 C)  SpO2: 96%  Weight: 138 lb 9.6 oz (62.9 kg)  Height: 5\' 1"  (1.549 m)   Body mass index is 26.19 kg/m.  Allergies as of 03/18/2018      Reactions   Iodinated Diagnostic Agents Itching, Swelling   Other reaction(s): Unknown   Nitrofurantoin Diarrhea   Other reaction(s): Unknown   Omnipaque [iohexol]    Solifenacin Other (See Comments)   indigestion   Ciprofloxacin Other (See Comments), Rash   Colitis   Metronidazole Itching, Rash   Sulfa Antibiotics Itching, Rash      Medication List        Accurate as of 03/18/18  3:04 PM. Always use your most recent med list.          acetaminophen 325 MG tablet Commonly known as:  TYLENOL Take 650 mg by mouth 4 (four) times daily.   albuterol 108 (90 Base) MCG/ACT inhaler Commonly known as:  PROVENTIL HFA;VENTOLIN HFA Inhale 2 puffs into the lungs every 4 (four) hours as needed for wheezing or shortness of breath.   alum & mag hydroxide-simeth 161-096-04 MG/5ML suspension Commonly known as:  MAALOX PLUS Take 30 mLs by mouth every 4 (four) hours as needed.   ASPERCREME LIDOCAINE 4 % Ptch Generic drug:  Lidocaine Apply 2 patches topically daily. Apply one patch to Right Flank/sore ribs and one patch to the  right forearm daily. Remove after 12 hours   aspirin 81 MG tablet Take 81 mg by mouth daily.   BREO ELLIPTA 100-25 MCG/INH Aepb Generic drug:  fluticasone furoate-vilanterol Inhale 1 puff into the lungs daily. Rinse mouth well after use   diclofenac sodium 1 % Gel Commonly known as:  VOLTAREN Apply 4 g topically 4 (four) times daily as needed (pain). To lumbar spine   diltiazem 60 MG tablet Commonly known as:  CARDIZEM Take 60 mg by mouth daily.   docusate sodium 100 MG capsule Commonly known as:  COLACE Take 1 capsule (100 mg total) by mouth 2 (two) times daily.   fluticasone 50 MCG/ACT nasal spray Commonly known as:  FLONASE Place 1 spray into both nostrils daily.   gabapentin 100 MG capsule Commonly known as:  NEURONTIN Take 200 mg by mouth 2 (two) times daily. 2 caps   ipratropium-albuterol 0.5-2.5 (3) MG/3ML Soln Commonly known as:  DUONEB Take 3 mLs by nebulization 3 (three) times daily.   magnesium hydroxide 400 MG/5ML suspension Commonly known as:  MILK OF MAGNESIA Take 30 mLs by mouth daily as needed for mild constipation.   methocarbamol 500 MG tablet Commonly known as:  ROBAXIN Take 250 mg by mouth 2 (two) times daily. 1/2 tablet   MINERIN Crea Apply liberal amount daily to arms and legs after the bath and PRN for skin integrity   montelukast 10 MG tablet Commonly known as:  SINGULAIR Take 10 mg by mouth at bedtime.   oxycodone 5 MG capsule Commonly known as:  OXY-IR Take 1 capsule (5 mg total) by mouth every 4 (four) hours as needed. For moderate or severe pain   OXYGEN Inhale 2 L into the lungs continuous as needed.   pantoprazole 40 MG tablet Commonly known as:  PROTONIX Take 40 mg by mouth daily.   polyethylene glycol packet Commonly known as:  MIRALAX /  GLYCOLAX Take 17 g by mouth 2 (two) times daily.   potassium chloride 10 MEQ tablet Commonly known as:  K-DUR,KLOR-CON Take 10 mEq by mouth daily.   predniSONE 10 MG tablet Commonly  known as:  DELTASONE Take 10 mg by mouth daily with breakfast.   ranitidine 75 MG tablet Commonly known as:  ZANTAC Take 75 mg by mouth 2 (two) times daily.   REFRESH TEARS 0.5 % Soln Generic drug:  carboxymethylcellulose Place 2 drops into both eyes See admin instructions. 1 to 4 times daily prn   SENNA-PLUS 8.6-50 MG tablet Generic drug:  senna-docusate Take 2 tablets by mouth 2 (two) times daily as needed.   sertraline 100 MG tablet Commonly known as:  ZOLOFT Take 100 mg by mouth daily. 8 pm   sertraline 50 MG tablet Commonly known as:  ZOLOFT Take 50 mg by mouth at bedtime. 8 pm   sodium phosphate Pediatric 3.5-9.5 GM/59ML enema Place 1 enema rectally once as needed for severe constipation. Take once every 3 days if constipation is not relieved by Milk of magnesia or Bisacodyl Suppository   SYSTANE 0.4-0.3 % Gel ophthalmic gel Generic drug:  Polyethyl Glycol-Propyl Glycol Place 1 application into both eyes at bedtime. (2 drops ) for dry eyes (Ointments are on Backorder)   torsemide 10 MG tablet Commonly known as:  DEMADEX Take 10 mg by mouth daily. 1 and 1/2 tab   UNABLE TO FIND Diet Type: NAS        SIGNIFICANT DIAGNOSTIC EXAMS  LABS REVIEWED PREVIOUS   02-09-18: wbc 6.5 hgb 11.0; hct 33.1; mcv 94.0 plt 165  TODAY:   10 -31-19: wbc 6.6; hgb 10.3; hct 32.5; mcv 96.4; plt  178; glucose 88; bun 30; creat 1.27; k+ 4.9; na++ 141; ca 8.5   Review of Systems  Constitutional: Negative for malaise/fatigue.  Respiratory: Negative for cough and shortness of breath.   Cardiovascular: Negative for chest pain, palpitations and leg swelling.  Gastrointestinal: Negative for abdominal pain, constipation and heartburn.  Musculoskeletal: Negative for back pain, joint pain and myalgias.  Skin: Negative.   Neurological: Negative for dizziness.  Psychiatric/Behavioral: The patient is not nervous/anxious.       Physical Exam  Constitutional: She appears well-developed and  well-nourished. No distress.  Neck: No thyromegaly present.  Cardiovascular: Normal rate, regular rhythm, normal heart sounds and intact distal pulses.  Pulmonary/Chest: Effort normal and breath sounds normal. No respiratory distress.  Abdominal: Soft. Bowel sounds are normal. She exhibits no distension. There is no tenderness.  Musculoskeletal: She exhibits no edema.  Unable to move left upper extremity does have splint Left lower extremity is weak Is able to move right extremities.     Lymphadenopathy:    She has no cervical adenopathy.  Neurological: She is alert.  Skin: Skin is warm and dry. She is not diaphoretic.  Has bruising present   Psychiatric: She has a normal mood and affect.     ASSESSMENT/ PLAN:  TODAY:   1. Paroxysmal atrial fibrillation: heart rate is stable: will continue asa 81 mg daily cardizem 60 mg daily for rate control. Will stop her eliquis at this time.   2. Unspecified type of chronic obstructive pulmonary disease (COPD): is stable will continue breo inhaler daily; duoneb three times daily albuterol inhaler 2 puffs every 4 hours as needed has 02 as needed and flonase daily   3. GERD without esophagitis: is stable will continue protonix 40 mg daily and zantac 75 mg twice daily  4. Chronic constipation: is stable will continue miralax twice daily colace twice daily   5. Hypertensive chronic kidney disease w stg 1-4/unsp chr kdny: is stable b/p 146/55: will monitor   6. Chronic kidney disease stage III (moderate): is stable will monitor  7. 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 Neuropathic pain: is under control: will continue neurontin 200 mg twice daily   9. Chronic bilateral low back pain without sciatica: is stable will continue tylenol 650 mg four times daily robaxin 250 mg twice daily lidoderm 4% pack to right flank and right arm daily; has voltaren gel 4 gm to lower back four  times as needed;   10. 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 Hypokalemia: is stable will continue k+ 20 meq daily   12. Bilateral lower extremity edema: is stable will continue demadex  10 mg daily   13. Vitamin B12 deficiency anemia due to intrinsic factor deficiency: is stable is off iron and will continue vit B 12  14. Dyslipidemia: stable is off  lipitor   Her health maintenance is up to date.    MD is aware of resident's narcotic use and is in agreement with current plan of care. We will wean dosage as appropriate for resident   Ok Edwards NP [facility]  Contact [phone] Monday through Friday 8am- 5pm  After hours call [phone]

## 2018-03-19 ENCOUNTER — Non-Acute Institutional Stay (SKILLED_NURSING_FACILITY): Payer: Medicare Other | Admitting: Adult Health

## 2018-03-19 ENCOUNTER — Encounter: Payer: Self-pay | Admitting: Adult Health

## 2018-03-19 DIAGNOSIS — B029 Zoster without complications: Secondary | ICD-10-CM

## 2018-03-19 NOTE — Progress Notes (Signed)
[facility] Room Number: [place] of Service:  SNF (31)   CODE STATUS: DNR  Allergies  Allergen Reactions  . Iodinated Diagnostic Agents Itching and Swelling    Other reaction(s): Unknown  . Nitrofurantoin Diarrhea    Other reaction(s): Unknown  . Omnipaque [Iohexol]   . Solifenacin Other (See Comments)    indigestion  . Ciprofloxacin Other (See Comments) and Rash    Colitis  . Metronidazole Itching and Rash  . Sulfa Antibiotics Itching and Rash    Chief Complaint  Patient presents with  . Acute Visit    Shingles    HPI:  She has developed a red raised rash running along a dermatone on her sacral area. She does have pain and tenderness to the area. The rash started yesterday; no papules yet. No reports of fevers present.   Past Medical History:  Diagnosis Date  . Adenoma of rectum   . Adrenal adenoma   . Anemia   . Anemia, unspecified   . Arthritis   . Ascending aortic aneurysm ([place])    a. s/p repair 07/2014  . Atrophic vaginitis   . Benign neoplasm of colon, unspecified   . COPD (chronic obstructive pulmonary disease) ([place])   . Coronary artery disease, non-occlusive    a. LHC 06/2014 without evidence of obstructive disease  . Depression   . Diverticulosis   . Dysphagia   . Dysrhythmia   . Essential hypertension    benign  . Frequent UTI   . GERD (gastroesophageal reflux disease)   . Hemiplegia and hemiparesis    following cerebral infarction affecting left non-dominant side  . Hypertension   . Microscopic hematuria   . Nonrheumatic aortic valve insufficiency   . Osteoporosis    unspecified  . Panic attacks   . Paroxysmal atrial fibrillation (HCC)   . Pernicious anemia   . Renal cyst    CKD STAGE 3  . Stroke ([place]) 07/23/2014   a. post-operative setting in 07/2014 leading to left UE hemiapresis and left LE weakness  . Urge incontinence     Past Surgical History:  Procedure Laterality Date  . ABDOMINAL  HYSTERECTOMY  1972  . AORTOILIAC BYPASS    . ASCENDING AORTIC ANEURYSM REPAIR  07/23/2014   07/2014  . BLEPHAROPLASTY    . CATARACT EXTRACTION  2005  . GASTROSTOMY W/ FEEDING TUBE    . INTRAMEDULLARY (IM) NAIL INTERTROCHANTERIC Left 02/26/2016   Procedure: INTRAMEDULLARY (IM) NAIL INTERTROCHANTRIC;  Surgeon: Earnestine Leys, MD;  Location: [facility] ORS;  Service: Orthopedics;  Laterality: Left;  . KYPHOPLASTY N/A 08/26/2016   Procedure: KYPHOPLASTY T12;  Surgeon: Hessie Knows, MD;  Location: [facility] ORS;  Service: Orthopedics;  Laterality: N/A;  . LEFT HEART CATH  06/2014  . PR ASCEND AORTIC GRAFT INCL VALVE SUSPENSION  07/07/2014   Procedure: ASCENDING AORTA GRAFT, WITH CARDIOPULMONARY BYPASS, WITH OR WITHOUT VALVE SUSPENSION; Surgeon: Dwaine Deter, MD; Location: [facility]IN OR [facility]; Service: Cardiothoracic  . PR LAP, GASTROTOMY W/O TUBE CONSTR  08/08/2014   Procedure: LAPAROSCOPY, SURGICAL; GASTOSTOMY W/O CONSTRUCTION OF GASTRIC TUBE (EG, STAMM PROCEDURE)(SEPARATE PROCED); Surgeon: Fredrik Rigger, MD; Location: MAIN OR [facility]; Service: Gastrointestinal  . PR PLACE PERCUT GASTROSTOMY TUBE  08/08/2014   Procedure: UGI ENDO; W/DIRECTED PLCMT PERQ GASTROSTOMY TUBE; Surgeon: Fredrik Rigger, MD; Location: MAIN OR [facility]; Service: Gastrointestinal    Social History   Socioeconomic History  . Marital status: Widowed    Spouse name: Not on file  .  Number of children: 3  . Years of education: 17  . Highest education level: Not on file  Occupational History  . Not on file  Social Needs  . Financial resource strain: Not on file  . Food insecurity:    Worry: Not on file    Inability: Not on file  . Transportation needs:    Medical: Not on file    Non-medical: Not on file  Tobacco Use  . Smoking status: Former Smoker    Packs/day: 0.50    Years: 50.00    Pack years: 25.00    Types: Cigarettes    Last attempt to quit: 05/30/2014    Years since quitting: 3.8  . Smokeless tobacco: Never  Used  Substance and Sexual Activity  . Alcohol use: No    Comment: occasionally  . Drug use: No  . Sexual activity: Not on file  Lifestyle  . Physical activity:    Days per week: Not on file    Minutes per session: Not on file  . Stress: Not on file  Relationships  . Social connections:    Talks on phone: Not on file    Gets together: Not on file    Attends religious service: Not on file    Active member of club or organization: Not on file    Attends meetings of clubs or organizations: Not on file    Relationship status: Not on file  . Intimate partner violence:    Fear of current or ex partner: Not on file    Emotionally abused: Not on file    Physically abused: Not on file    Forced sexual activity: Not on file  Other Topics Concern  . Not on file  Social History Narrative   Admitted to [place] 03/01/2016   Widowed   4 children   Former Smoker   Occasionally drinks   DNR   Family History  Problem Relation Age of Onset  . Lung cancer Son   . CAD Father   . Heart attack Father   . CAD Brother   . Heart attack Brother   . Stroke Mother       VITAL SIGNS BP (!) 146/55   Pulse 73   Temp 97.8 F (36.6 C)   Resp 18   Ht 5\' 1"  (1.549 m)   Wt 141 lb 8 oz (64.2 kg)   SpO2 96%   BMI 26.74 kg/m   Outpatient Encounter Medications as of 03/19/2018  Medication Sig  . acetaminophen (TYLENOL) 325 MG tablet Take 650 mg by mouth 4 (four) times daily.   Marland Kitchen albuterol (PROVENTIL HFA;VENTOLIN HFA) 108 (90 Base) MCG/ACT inhaler Inhale 2 puffs into the lungs every 4 (four) hours as needed for wheezing or shortness of breath.  Marland Kitchen alum & mag hydroxide-simeth (MAALOX PLUS) 400-400-40 MG/5ML suspension Take 30 mLs by mouth every 4 (four) hours as needed.  Marland Kitchen aspirin 81 MG tablet Take 81 mg by mouth daily.   Marland Kitchen BREO ELLIPTA 100-25 MCG/INH AEPB Inhale 1 puff into the lungs daily. Rinse mouth well after use  . carboxymethylcellulose (REFRESH TEARS) 0.5 % SOLN Place 2  drops into both eyes See admin instructions. 1 to 4 times daily prn  . diclofenac sodium (VOLTAREN) 1 % GEL Apply 4 g topically 4 (four) times daily as needed (pain). To lumbar spine  . diclofenac sodium (VOLTAREN) 1 % GEL Apply 4 g topically every 8 (eight) hours as needed. Apply to bilateral hip and  knees  . diltiazem (CARDIZEM) 60 MG tablet Take 60 mg by mouth daily.  Marland Kitchen docusate sodium (COLACE) 100 MG capsule Take 1 capsule (100 mg total) by mouth 2 (two) times daily.  Marland Kitchen gabapentin (NEURONTIN) 100 MG capsule Take 200 mg by mouth 2 (two) times daily. 2 caps  . ipratropium-albuterol (DUONEB) 0.5-2.5 (3) MG/3ML SOLN Take 3 mLs by nebulization 3 (three) times daily.  . Liniments (SALONPAS PAIN RELIEF PATCH EX) Lidocain 4% - Apply one patch to Right Flank / sore ribs daily.  Remove after 12 hours  . magnesium hydroxide (MILK OF MAGNESIA) 400 MG/5ML suspension Take 30 mLs by mouth daily as needed for mild constipation.  . methocarbamol (ROBAXIN) 500 MG tablet Take 250 mg by mouth 2 (two) times daily. 1/2 tablet  . montelukast (SINGULAIR) 10 MG tablet Take 10 mg by mouth at bedtime.  Marland Kitchen oxycodone (OXY-IR) 5 MG capsule Take 1 capsule (5 mg total) by mouth every 4 (four) hours as needed. For moderate or severe pain  . OXYGEN Inhale 2 L into the lungs continuous as needed.  . pantoprazole (PROTONIX) 40 MG tablet Take 40 mg by mouth daily.  Vladimir Faster Glycol-Propyl Glycol (SYSTANE) 0.4-0.3 % GEL ophthalmic gel Place 1 application into both eyes at bedtime. (2 drops ) for dry eyes (Ointments are on Backorder)  . polyethylene glycol (MIRALAX) packet Take 17 g by mouth 2 (two) times daily.  . potassium chloride (K-DUR,KLOR-CON) 10 MEQ tablet Take 10 mEq by mouth daily.   . predniSONE (DELTASONE) 10 MG tablet Take 10 mg by mouth daily with breakfast.  . ranitidine (ZANTAC) 75 MG tablet Take 75 mg by mouth 2 (two) times daily.   Marland Kitchen senna-docusate (SENNA-PLUS) 8.6-50 MG tablet Take 2 tablets by mouth 2 (two)  times daily as needed.   . sertraline (ZOLOFT) 100 MG tablet Take 100 mg by mouth daily. 8 pm  . sertraline (ZOLOFT) 50 MG tablet Take 50 mg by mouth at bedtime. 8 pm  . Skin Protectants, Misc. (MINERIN) CREA Apply liberal amount daily to arms and legs after the bath and PRN for skin integrity  . sodium phosphate Pediatric (FLEET) 3.5-9.5 GM/59ML enema Place 1 enema rectally once as needed for severe constipation. Take once every 3 days if constipation is not relieved by Milk of magnesia or Bisacodyl Suppository  . torsemide (DEMADEX) 10 MG tablet Take 10 mg by mouth daily. 1 and 1/2 tab   . UNABLE TO FIND Diet Type: NAS  . [DISCONTINUED] fluticasone (FLONASE) 50 MCG/ACT nasal spray Place 1 spray into both nostrils daily.   . [DISCONTINUED] Lidocaine (ASPERCREME LIDOCAINE) 4 % PTCH Apply 2 patches topically daily. Apply one patch to Right Flank/sore ribs and one patch to the right forearm daily. Remove after 12 hours   No facility-administered encounter medications on file as of 03/19/2018.      SIGNIFICANT DIAGNOSTIC EXAMS   LABS REVIEWED PREVIOUS   02-09-18: wbc 6.5 hgb 11.0; hct 33.1; mcv 94.0 plt 165 10 -31-19: wbc 6.6; hgb 10.3; hct 32.5; mcv 96.4; plt  178; glucose 88; bun 30; creat 1.27; k+ 4.9; na++ 141; ca 8.5  NO NEW LABS.   Review of Systems  Constitutional: Negative for malaise/fatigue.  Respiratory: Negative for cough and shortness of breath.   Cardiovascular: Negative for chest pain, palpitations and leg swelling.  Gastrointestinal: Negative for abdominal pain, constipation and heartburn.  Musculoskeletal: Negative for back pain, joint pain and myalgias.  Skin: Positive for rash.  Is painful and tender to touch   Neurological: Negative for dizziness.  Psychiatric/Behavioral: The patient is not nervous/anxious.     Physical Exam  Constitutional: She appears well-developed and well-nourished. No distress.  Neck: No thyromegaly present.  Cardiovascular: Normal  rate, regular rhythm, normal heart sounds and intact distal pulses.  Pulmonary/Chest: Effort normal and breath sounds normal. No respiratory distress.  Abdominal: Soft. Bowel sounds are normal. She exhibits no distension. There is no tenderness.  Musculoskeletal: She exhibits no edema.  Unable to move left upper extremity does have splint Left lower extremity is weak Is able to move right extremities.     Lymphadenopathy:    She has no cervical adenopathy.  Neurological: She is alert.  Skin: Skin is warm and dry. Rash noted. She is not diaphoretic.  On dermatone on her sacrum: is red raised without papules yet.   Psychiatric: She has a normal mood and affect.     ASSESSMENT/ PLAN:  TODAY:   1. Shingles: will begin valtrex 1 gm twice daily through 03-26-18. Will monitor her status. This has been caught very early; hopefully will not have lasting pain.      MD is aware of resident's narcotic use and is in agreement with current plan of care. We will attempt to wean resident as apropriate   Ok Edwards NP [facility]  Contact [phone] Monday through Friday 8am- 5pm  After hours call [phone]

## 2018-03-22 DIAGNOSIS — J449 Chronic obstructive pulmonary disease, unspecified: Secondary | ICD-10-CM | POA: Diagnosis not present

## 2018-03-22 DIAGNOSIS — M6281 Muscle weakness (generalized): Secondary | ICD-10-CM | POA: Diagnosis not present

## 2018-03-22 DIAGNOSIS — I69354 Hemiplegia and hemiparesis following cerebral infarction affecting left non-dominant side: Secondary | ICD-10-CM | POA: Diagnosis not present

## 2018-03-22 DIAGNOSIS — I482 Chronic atrial fibrillation, unspecified: Secondary | ICD-10-CM | POA: Diagnosis not present

## 2018-03-23 DIAGNOSIS — B029 Zoster without complications: Secondary | ICD-10-CM | POA: Insufficient documentation

## 2018-03-25 DIAGNOSIS — J449 Chronic obstructive pulmonary disease, unspecified: Secondary | ICD-10-CM | POA: Diagnosis not present

## 2018-03-25 DIAGNOSIS — I69354 Hemiplegia and hemiparesis following cerebral infarction affecting left non-dominant side: Secondary | ICD-10-CM | POA: Diagnosis not present

## 2018-03-25 DIAGNOSIS — M6281 Muscle weakness (generalized): Secondary | ICD-10-CM | POA: Diagnosis not present

## 2018-03-25 DIAGNOSIS — I482 Chronic atrial fibrillation, unspecified: Secondary | ICD-10-CM | POA: Diagnosis not present

## 2018-03-29 DIAGNOSIS — I482 Chronic atrial fibrillation, unspecified: Secondary | ICD-10-CM | POA: Diagnosis not present

## 2018-03-29 DIAGNOSIS — I69354 Hemiplegia and hemiparesis following cerebral infarction affecting left non-dominant side: Secondary | ICD-10-CM | POA: Diagnosis not present

## 2018-03-29 DIAGNOSIS — M6281 Muscle weakness (generalized): Secondary | ICD-10-CM | POA: Diagnosis not present

## 2018-03-29 DIAGNOSIS — J449 Chronic obstructive pulmonary disease, unspecified: Secondary | ICD-10-CM | POA: Diagnosis not present

## 2018-03-30 DIAGNOSIS — F325 Major depressive disorder, single episode, in full remission: Secondary | ICD-10-CM | POA: Diagnosis not present

## 2018-03-30 DIAGNOSIS — J449 Chronic obstructive pulmonary disease, unspecified: Secondary | ICD-10-CM | POA: Diagnosis not present

## 2018-03-30 DIAGNOSIS — I10 Essential (primary) hypertension: Secondary | ICD-10-CM | POA: Diagnosis not present

## 2018-03-30 DIAGNOSIS — I482 Chronic atrial fibrillation, unspecified: Secondary | ICD-10-CM | POA: Diagnosis not present

## 2018-03-30 DIAGNOSIS — M6281 Muscle weakness (generalized): Secondary | ICD-10-CM | POA: Diagnosis not present

## 2018-03-30 DIAGNOSIS — Z789 Other specified health status: Secondary | ICD-10-CM | POA: Diagnosis not present

## 2018-03-30 DIAGNOSIS — I69354 Hemiplegia and hemiparesis following cerebral infarction affecting left non-dominant side: Secondary | ICD-10-CM | POA: Diagnosis not present

## 2018-03-31 DIAGNOSIS — J449 Chronic obstructive pulmonary disease, unspecified: Secondary | ICD-10-CM | POA: Diagnosis not present

## 2018-03-31 DIAGNOSIS — I69354 Hemiplegia and hemiparesis following cerebral infarction affecting left non-dominant side: Secondary | ICD-10-CM | POA: Diagnosis not present

## 2018-03-31 DIAGNOSIS — I482 Chronic atrial fibrillation, unspecified: Secondary | ICD-10-CM | POA: Diagnosis not present

## 2018-03-31 DIAGNOSIS — M6281 Muscle weakness (generalized): Secondary | ICD-10-CM | POA: Diagnosis not present

## 2018-04-04 ENCOUNTER — Encounter
Admission: RE | Admit: 2018-04-04 | Discharge: 2018-04-04 | Disposition: A | Discharge status: Home / Self Care | Payer: Medicare Other | Source: Ambulatory Visit | Attending: Internal Medicine | Admitting: Internal Medicine

## 2018-04-05 DIAGNOSIS — M62422 Contracture of muscle, left upper arm: Secondary | ICD-10-CM | POA: Diagnosis not present

## 2018-04-05 DIAGNOSIS — M6281 Muscle weakness (generalized): Secondary | ICD-10-CM | POA: Diagnosis not present

## 2018-04-05 DIAGNOSIS — I69354 Hemiplegia and hemiparesis following cerebral infarction affecting left non-dominant side: Secondary | ICD-10-CM | POA: Diagnosis not present

## 2018-04-05 DIAGNOSIS — R279 Unspecified lack of coordination: Secondary | ICD-10-CM | POA: Diagnosis not present

## 2018-04-06 DIAGNOSIS — M6281 Muscle weakness (generalized): Secondary | ICD-10-CM | POA: Diagnosis not present

## 2018-04-06 DIAGNOSIS — M62422 Contracture of muscle, left upper arm: Secondary | ICD-10-CM | POA: Diagnosis not present

## 2018-04-06 DIAGNOSIS — R279 Unspecified lack of coordination: Secondary | ICD-10-CM | POA: Diagnosis not present

## 2018-04-06 DIAGNOSIS — I69354 Hemiplegia and hemiparesis following cerebral infarction affecting left non-dominant side: Secondary | ICD-10-CM | POA: Diagnosis not present

## 2018-04-07 DIAGNOSIS — M6281 Muscle weakness (generalized): Secondary | ICD-10-CM | POA: Diagnosis not present

## 2018-04-07 DIAGNOSIS — I69354 Hemiplegia and hemiparesis following cerebral infarction affecting left non-dominant side: Secondary | ICD-10-CM | POA: Diagnosis not present

## 2018-04-07 DIAGNOSIS — R279 Unspecified lack of coordination: Secondary | ICD-10-CM | POA: Diagnosis not present

## 2018-04-07 DIAGNOSIS — M62422 Contracture of muscle, left upper arm: Secondary | ICD-10-CM | POA: Diagnosis not present

## 2018-04-12 DIAGNOSIS — R279 Unspecified lack of coordination: Secondary | ICD-10-CM | POA: Diagnosis not present

## 2018-04-12 DIAGNOSIS — M62422 Contracture of muscle, left upper arm: Secondary | ICD-10-CM | POA: Diagnosis not present

## 2018-04-12 DIAGNOSIS — M6281 Muscle weakness (generalized): Secondary | ICD-10-CM | POA: Diagnosis not present

## 2018-04-12 DIAGNOSIS — I69354 Hemiplegia and hemiparesis following cerebral infarction affecting left non-dominant side: Secondary | ICD-10-CM | POA: Diagnosis not present

## 2018-04-14 ENCOUNTER — Non-Acute Institutional Stay (SKILLED_NURSING_FACILITY): Payer: Medicare Other | Admitting: Adult Health

## 2018-04-14 ENCOUNTER — Encounter: Payer: Self-pay | Admitting: Adult Health

## 2018-04-14 DIAGNOSIS — I69354 Hemiplegia and hemiparesis following cerebral infarction affecting left non-dominant side: Secondary | ICD-10-CM

## 2018-04-14 DIAGNOSIS — J438 Other emphysema: Secondary | ICD-10-CM | POA: Diagnosis not present

## 2018-04-14 DIAGNOSIS — F325 Major depressive disorder, single episode, in full remission: Secondary | ICD-10-CM | POA: Diagnosis not present

## 2018-04-14 DIAGNOSIS — M62422 Contracture of muscle, left upper arm: Secondary | ICD-10-CM | POA: Diagnosis not present

## 2018-04-14 DIAGNOSIS — R279 Unspecified lack of coordination: Secondary | ICD-10-CM | POA: Diagnosis not present

## 2018-04-14 DIAGNOSIS — M6281 Muscle weakness (generalized): Secondary | ICD-10-CM | POA: Diagnosis not present

## 2018-04-14 NOTE — Progress Notes (Signed)
[facility]   [facility] Room Number: [place] of Service:  SNF (31)   CODE STATUS: DNR  Allergies  Allergen Reactions  . Iodinated Diagnostic Agents Itching and Swelling    Other reaction(s): Unknown  . Nitrofurantoin Diarrhea    Other reaction(s): Unknown  . Omnipaque [Iohexol]   . Solifenacin Other (See Comments)    indigestion  . Ciprofloxacin Other (See Comments) and Rash    Colitis  . Metronidazole Itching and Rash  . Sulfa Antibiotics Itching and Rash    Chief Complaint  Patient presents with  . Acute Visit    Care Plan Meeting    HPI:  We have come together for her routine care plan meeting; her daughter is present via phone. She has gained weight from 123 pounds in June to her current weight of 142 pounds. She has recently been treated for shingles without complications. There are no reports of uncontrolled pain; her appetite is good; she does not have anxiety or insomnia. We did discuss her use of prednisone and will try a lower dose. She will continue to be followed for her chronic illnesses including: copd; hemiplegia; depression.   Past Medical History:  Diagnosis Date  . Adenoma of rectum   . Adrenal adenoma   . Anemia   . Anemia, unspecified   . Arthritis   . Ascending aortic aneurysm ([place])    a. s/p repair 07/2014  . Atrophic vaginitis   . Benign neoplasm of colon, unspecified   . COPD (chronic obstructive pulmonary disease) ([place])   . Coronary artery disease, non-occlusive    a. LHC 06/2014 without evidence of obstructive disease  . Depression   . Diverticulosis   . Dysphagia   . Dysrhythmia   . Essential hypertension    benign  . Frequent UTI   . GERD (gastroesophageal reflux disease)   . Hemiplegia and hemiparesis    following cerebral infarction affecting left non-dominant side  . Hypertension   . Microscopic hematuria   . Nonrheumatic aortic valve insufficiency   . Osteoporosis    unspecified  . Panic attacks    . Paroxysmal atrial fibrillation (HCC)   . Pernicious anemia   . Renal cyst    CKD STAGE 3  . Stroke ([place]) 07/23/2014   a. post-operative setting in 07/2014 leading to left UE hemiapresis and left LE weakness  . Urge incontinence     Past Surgical History:  Procedure Laterality Date  . ABDOMINAL HYSTERECTOMY  1972  . AORTOILIAC BYPASS    . ASCENDING AORTIC ANEURYSM REPAIR  07/23/2014   07/2014  . BLEPHAROPLASTY    . CATARACT EXTRACTION  2005  . GASTROSTOMY W/ FEEDING TUBE    . INTRAMEDULLARY (IM) NAIL INTERTROCHANTERIC Left 02/26/2016   Procedure: INTRAMEDULLARY (IM) NAIL INTERTROCHANTRIC;  Surgeon: Earnestine Leys, MD;  Location: [facility] ORS;  Service: Orthopedics;  Laterality: Left;  . KYPHOPLASTY N/A 08/26/2016   Procedure: KYPHOPLASTY T12;  Surgeon: Hessie Knows, MD;  Location: [facility] ORS;  Service: Orthopedics;  Laterality: N/A;  . LEFT HEART CATH  06/2014  . PR ASCEND AORTIC GRAFT INCL VALVE SUSPENSION  07/07/2014   Procedure: ASCENDING AORTA GRAFT, WITH CARDIOPULMONARY BYPASS, WITH OR WITHOUT VALVE SUSPENSION; Surgeon: Dwaine Deter, MD; Location[facility] OR [facility]; Service: Cardiothoracic  . PR LAP, GASTROTOMY W/O TUBE CONSTR  08/08/2014   Procedure: LAPAROSCOPY, SURGICAL; GASTOSTOMY W/O CONSTRUCTION OF GASTRIC TUBE (EG, STAMM PROCEDURE)(SEPARATE PROCED); Surgeon: Fredrik Rigger, MD; Location[facility] MAIN OR [facility]; Service: Gastrointestinal  .  PR PLACE PERCUT GASTROSTOMY TUBE  08/08/2014   Procedure: UGI ENDO; W/DIRECTED PLCMT PERQ GASTROSTOMY TUBE; Surgeon: Fredrik Rigger, MD; Location: MAIN OR [facility]; Service: Gastrointestinal    Social History   Socioeconomic History  . Marital status: Widowed    Spouse name: Not on file  . Number of children: 3  . Years of education: 33  . Highest education level: Not on file  Occupational History  . Not on file  Social Needs  . Financial resource strain: Not on file  . Food insecurity:    Worry: Not on file    Inability: Not  on file  . Transportation needs:    Medical: Not on file    Non-medical: Not on file  Tobacco Use  . Smoking status: Former Smoker    Packs/day: 0.50    Years: 50.00    Pack years: 25.00    Types: Cigarettes    Last attempt to quit: 05/30/2014    Years since quitting: 3.8  . Smokeless tobacco: Never Used  Substance and Sexual Activity  . Alcohol use: No    Comment: occasionally  . Drug use: No  . Sexual activity: Not on file  Lifestyle  . Physical activity:    Days per week: Not on file    Minutes per session: Not on file  . Stress: Not on file  Relationships  . Social connections:    Talks on phone: Not on file    Gets together: Not on file    Attends religious service: Not on file    Active member of club or organization: Not on file    Attends meetings of clubs or organizations: Not on file    Relationship status: Not on file  . Intimate partner violence:    Fear of current or ex partner: Not on file    Emotionally abused: Not on file    Physically abused: Not on file    Forced sexual activity: Not on file  Other Topics Concern  . Not on file  Social History Narrative   Admitted to [place] 03/01/2016   Widowed   4 children   Former Smoker   Occasionally drinks   DNR   Family History  Problem Relation Age of Onset  . Lung cancer Son   . CAD Father   . Heart attack Father   . CAD Brother   . Heart attack Brother   . Stroke Mother       VITAL SIGNS BP (!) 144/50   Pulse 61   Temp 97.7 F (36.5 C)   Resp 18   Ht 5\' 1"  (1.549 m)   Wt 142 lb (64.4 kg)   SpO2 97%   BMI 26.83 kg/m   Outpatient Encounter Medications as of 04/14/2018  Medication Sig  . acetaminophen (TYLENOL) 325 MG tablet Take 650 mg by mouth 4 (four) times daily.   Marland Kitchen albuterol (PROVENTIL HFA;VENTOLIN HFA) 108 (90 Base) MCG/ACT inhaler Inhale 2 puffs into the lungs every 4 (four) hours as needed for wheezing or shortness of breath.  Marland Kitchen alum & mag hydroxide-simeth (MAALOX  PLUS) 400-400-40 MG/5ML suspension Take 30 mLs by mouth every 4 (four) hours as needed.  Marland Kitchen aspirin 81 MG tablet Take 81 mg by mouth daily.   Marland Kitchen BREO ELLIPTA 100-25 MCG/INH AEPB Inhale 1 puff into the lungs daily. Rinse mouth well after use  . carboxymethylcellulose (REFRESH TEARS) 0.5 % SOLN Place 2 drops into both eyes See admin instructions. 1  to 4 times daily prn  . diclofenac sodium (VOLTAREN) 1 % GEL Apply 4 g topically 4 (four) times daily as needed (pain). To lumbar spine  . diclofenac sodium (VOLTAREN) 1 % GEL Apply 4 g topically every 8 (eight) hours as needed. Apply to bilateral hip and knees  . diltiazem (CARDIZEM) 60 MG tablet Take 60 mg by mouth daily.  Marland Kitchen docusate sodium (COLACE) 100 MG capsule Take 1 capsule (100 mg total) by mouth 2 (two) times daily.  Marland Kitchen gabapentin (NEURONTIN) 100 MG capsule Take 200 mg by mouth 2 (two) times daily. 2 caps  . ipratropium-albuterol (DUONEB) 0.5-2.5 (3) MG/3ML SOLN Take 3 mLs by nebulization 3 (three) times daily.  . Lidocaine 4 % PTCH [place] - Apply one patch to right flank / sore ribs.  Remove after 12 hours  . Liniments (SALONPAS PAIN RELIEF PATCH EX) Lidocain 4% - Apply one patch to Right Flank / sore ribs daily.  Remove after 12 hours  . magnesium hydroxide (MILK OF MAGNESIA) 400 MG/5ML suspension Take 30 mLs by mouth daily as needed for mild constipation.  . methocarbamol (ROBAXIN) 500 MG tablet Take 250 mg by mouth 2 (two) times daily. 1/2 tablet  . montelukast (SINGULAIR) 10 MG tablet Take 10 mg by mouth at bedtime.  Marland Kitchen oxycodone (OXY-IR) 5 MG capsule Take 1 capsule (5 mg total) by mouth every 4 (four) hours as needed. For moderate or severe pain  . OXYGEN Inhale 2 L into the lungs continuous as needed.  . pantoprazole (PROTONIX) 40 MG tablet Take 40 mg by mouth daily.  Vladimir Faster Glycol-Propyl Glycol (SYSTANE) 0.4-0.3 % GEL ophthalmic gel Place 1 application into both eyes at bedtime. (2 drops ) for dry eyes (Ointments are on Backorder)  .  polyethylene glycol (MIRALAX) packet Take 17 g by mouth 2 (two) times daily.  . potassium chloride (K-DUR,KLOR-CON) 10 MEQ tablet Take 10 mEq by mouth daily.   . predniSONE (DELTASONE) 10 MG tablet Take 10 mg by mouth daily with breakfast.  . senna-docusate (SENNA-PLUS) 8.6-50 MG tablet Take 2 tablets by mouth 2 (two) times daily as needed.   . sertraline (ZOLOFT) 100 MG tablet Take 100 mg by mouth daily. 8 pm  . sertraline (ZOLOFT) 50 MG tablet Take 50 mg by mouth at bedtime. 8 pm  . Skin Protectants, Misc. (MINERIN) CREA Apply liberal amount daily to arms and legs after the bath and PRN for skin integrity  . sodium phosphate Pediatric (FLEET) 3.5-9.5 GM/59ML enema Place 1 enema rectally once as needed for severe constipation. Take once every 3 days if constipation is not relieved by Milk of magnesia or Bisacodyl Suppository  . torsemide (DEMADEX) 10 MG tablet Take 10 mg by mouth daily. 1 and 1/2 tab   . UNABLE TO FIND Diet Type: NAS  . [DISCONTINUED] ranitidine (ZANTAC) 75 MG tablet Take 75 mg by mouth 2 (two) times daily.    No facility-administered encounter medications on file as of 04/14/2018.      SIGNIFICANT DIAGNOSTIC EXAMS  LABS REVIEWED PREVIOUS   02-09-18: wbc 6.5 hgb 11.0; hct 33.1; mcv 94.0 plt 165 10 -31-19: wbc 6.6; hgb 10.3; hct 32.5; mcv 96.4; plt  178; glucose 88; bun 30; creat 1.27; k+ 4.9; na++ 141; ca 8.5  NO NEW LABS.    Review of Systems  Constitutional: Negative for malaise/fatigue.  Respiratory: Negative for cough and shortness of breath.   Cardiovascular: Negative for chest pain, palpitations and leg swelling.  Gastrointestinal: Negative for  abdominal pain, constipation and heartburn.  Musculoskeletal: Negative for back pain, joint pain and myalgias.  Skin: Negative.   Neurological: Negative for dizziness.  Psychiatric/Behavioral: The patient is not nervous/anxious.     Physical Exam Constitutional:      General: She is not in acute distress.     Appearance: Normal appearance. She is well-developed. She is not diaphoretic.  Neck:     Musculoskeletal: Neck supple. No neck rigidity.     Thyroid: No thyromegaly.  Cardiovascular:     Rate and Rhythm: Normal rate and regular rhythm.     Heart sounds: Normal heart sounds.  Pulmonary:     Effort: Pulmonary effort is normal. No respiratory distress.     Breath sounds: Normal breath sounds.  Abdominal:     General: Bowel sounds are normal. There is no distension.     Palpations: Abdomen is soft.     Tenderness: There is no abdominal tenderness.  Musculoskeletal:     Right lower leg: No edema.     Left lower leg: No edema.     Comments: Unable to move left upper extremity does have splint Left lower extremity is weak Is able to move right extremities.    Lymphadenopathy:     Cervical: No cervical adenopathy.  Skin:    General: Skin is warm and dry.  Neurological:     General: No focal deficit present.     Mental Status: She is alert. Mental status is at baseline.  Psychiatric:        Mood and Affect: Mood normal.      ASSESSMENT/ PLAN:  TODAY:   1.  COPD:  2. Hemiplegia and hemiparesis following cerebral infarction affecting left non-dominant side 3. 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     MD is aware of resident's narcotic use and is in agreement with current plan of care. We will attempt to wean resident as apropriate   Ok Edwards NP [facility]  Contact [phone] Monday through Friday 8am- 5pm  After hours call [phone]

## 2018-04-15 DIAGNOSIS — M6281 Muscle weakness (generalized): Secondary | ICD-10-CM | POA: Diagnosis not present

## 2018-04-15 DIAGNOSIS — I69354 Hemiplegia and hemiparesis following cerebral infarction affecting left non-dominant side: Secondary | ICD-10-CM | POA: Diagnosis not present

## 2018-04-15 DIAGNOSIS — M62422 Contracture of muscle, left upper arm: Secondary | ICD-10-CM | POA: Diagnosis not present

## 2018-04-15 DIAGNOSIS — R279 Unspecified lack of coordination: Secondary | ICD-10-CM | POA: Diagnosis not present

## 2018-04-16 ENCOUNTER — Encounter: Payer: Self-pay | Admitting: Adult Health

## 2018-04-16 ENCOUNTER — Non-Acute Institutional Stay (SKILLED_NURSING_FACILITY): Payer: Medicare Other | Admitting: Adult Health

## 2018-04-16 DIAGNOSIS — K219 Gastro-esophageal reflux disease without esophagitis: Secondary | ICD-10-CM

## 2018-04-16 DIAGNOSIS — I480 Paroxysmal atrial fibrillation: Secondary | ICD-10-CM

## 2018-04-16 DIAGNOSIS — J438 Other emphysema: Secondary | ICD-10-CM

## 2018-04-16 NOTE — Progress Notes (Signed)
[facility]   [facility] Room Number: [place] of Service:  SNF (31)   CODE STATUS: DNR  Allergies  Allergen Reactions  . Iodinated Diagnostic Agents Itching and Swelling    Other reaction(s): Unknown  . Nitrofurantoin Diarrhea    Other reaction(s): Unknown  . Omnipaque [Iohexol]   . Solifenacin Other (See Comments)    indigestion  . Ciprofloxacin Other (See Comments) and Rash    Colitis  . Metronidazole Itching and Rash  . Sulfa Antibiotics Itching and Rash    Chief Complaint  Patient presents with  . Medical Management of Chronic Issues    Paroxysmal atrial fibrillation; gastroesophageal reflux disease without esophagitis; other emphysema.     HPI:  She is a 84 year old long term resident of this facility being seen for the management of her chronic illnesses: afib; gerd; copd. She denies any heart burn; no cough or shortness of breath. There are no reports of uncontrolled pain.   Past Medical History:  Diagnosis Date  . Adenoma of rectum   . Adrenal adenoma   . Anemia   . Anemia, unspecified   . Arthritis   . Ascending aortic aneurysm ([place])    a. s/p repair 07/2014  . Atrophic vaginitis   . Benign neoplasm of colon, unspecified   . COPD (chronic obstructive pulmonary disease) ([place])   . Coronary artery disease, non-occlusive    a. LHC 06/2014 without evidence of obstructive disease  . Depression   . Diverticulosis   . Dysphagia   . Dysrhythmia   . Essential hypertension    benign  . Frequent UTI   . GERD (gastroesophageal reflux disease)   . Hemiplegia and hemiparesis    following cerebral infarction affecting left non-dominant side  . Hypertension   . Microscopic hematuria   . Nonrheumatic aortic valve insufficiency   . Osteoporosis    unspecified  . Panic attacks   . Paroxysmal atrial fibrillation (HCC)   . Pernicious anemia   . Renal cyst    CKD STAGE 3  . Stroke ([place]) 07/23/2014   a. post-operative setting in 07/2014  leading to left UE hemiapresis and left LE weakness  . Urge incontinence     Past Surgical History:  Procedure Laterality Date  . ABDOMINAL HYSTERECTOMY  1972  . AORTOILIAC BYPASS    . ASCENDING AORTIC ANEURYSM REPAIR  07/23/2014   07/2014  . BLEPHAROPLASTY    . CATARACT EXTRACTION  2005  . GASTROSTOMY W/ FEEDING TUBE    . INTRAMEDULLARY (IM) NAIL INTERTROCHANTERIC Left 02/26/2016   Procedure: INTRAMEDULLARY (IM) NAIL INTERTROCHANTRIC;  Surgeon: Earnestine Leys, MD;  Location: [facility] ORS;  Service: Orthopedics;  Laterality: Left;  . KYPHOPLASTY N/A 08/26/2016   Procedure: KYPHOPLASTY T12;  Surgeon: Hessie Knows, MD;  Location: [facility] ORS;  Service: Orthopedics;  Laterality: N/A;  . LEFT HEART CATH  06/2014  . PR ASCEND AORTIC GRAFT INCL VALVE SUSPENSION  07/07/2014   Procedure: ASCENDING AORTA GRAFT, WITH CARDIOPULMONARY BYPASS, WITH OR WITHOUT VALVE SUSPENSION; Surgeon: Dwaine Deter, MD; Location: MAIN OR [facility]; Service: Cardiothoracic  . PR LAP, GASTROTOMY W/O TUBE CONSTR  08/08/2014   Procedure: LAPAROSCOPY, SURGICAL; GASTOSTOMY W/O CONSTRUCTION OF GASTRIC TUBE (EG, STAMM PROCEDURE)(SEPARATE PROCED); Surgeon: Fredrik Rigger, MD; Location: [facility] OR [facility]; Service: Gastrointestinal  . PR PLACE PERCUT GASTROSTOMY TUBE  08/08/2014   Procedure: UGI ENDO; W/DIRECTED PLCMT PERQ GASTROSTOMY TUBE; Surgeon: Fredrik Rigger, MD; Location: MAIN OR [facility]; Service: Gastrointestinal  Social History   Socioeconomic History  . Marital status: Widowed    Spouse name: Not on file  . Number of children: 3  . Years of education: 47  . Highest education level: Not on file  Occupational History  . Not on file  Social Needs  . Financial resource strain: Not on file  . Food insecurity:    Worry: Not on file    Inability: Not on file  . Transportation needs:    Medical: Not on file    Non-medical: Not on file  Tobacco Use  . Smoking status: Former Smoker    Packs/day: 0.50     Years: 50.00    Pack years: 25.00    Types: Cigarettes    Last attempt to quit: 05/30/2014    Years since quitting: 3.8  . Smokeless tobacco: Never Used  Substance and Sexual Activity  . Alcohol use: No    Comment: occasionally  . Drug use: No  . Sexual activity: Not on file  Lifestyle  . Physical activity:    Days per week: Not on file    Minutes per session: Not on file  . Stress: Not on file  Relationships  . Social connections:    Talks on phone: Not on file    Gets together: Not on file    Attends religious service: Not on file    Active member of club or organization: Not on file    Attends meetings of clubs or organizations: Not on file    Relationship status: Not on file  . Intimate partner violence:    Fear of current or ex partner: Not on file    Emotionally abused: Not on file    Physically abused: Not on file    Forced sexual activity: Not on file  Other Topics Concern  . Not on file  Social History Narrative   Admitted to [place] 03/01/2016   Widowed   4 children   Former Smoker   Occasionally drinks   DNR   Family History  Problem Relation Age of Onset  . Lung cancer Son   . CAD Father   . Heart attack Father   . CAD Brother   . Heart attack Brother   . Stroke Mother       VITAL SIGNS BP (!) 144/50   Pulse 61   Temp 97.7 F (36.5 C)   Resp 18   Ht 5\' 1"  (1.549 m)   Wt 143 lb 6.4 oz (65 kg)   SpO2 97%   BMI 27.10 kg/m   Outpatient Encounter Medications as of 04/16/2018  Medication Sig  . acetaminophen (TYLENOL) 325 MG tablet Take 650 mg by mouth 4 (four) times daily.   Marland Kitchen albuterol (PROVENTIL HFA;VENTOLIN HFA) 108 (90 Base) MCG/ACT inhaler Inhale 2 puffs into the lungs every 4 (four) hours as needed for wheezing or shortness of breath.  Marland Kitchen alum & mag hydroxide-simeth (MAALOX PLUS) 400-400-40 MG/5ML suspension Take 30 mLs by mouth every 4 (four) hours as needed.  Marland Kitchen aspirin 81 MG tablet Take 81 mg by mouth daily.   Marland Kitchen BREO  ELLIPTA 100-25 MCG/INH AEPB Inhale 1 puff into the lungs daily. Rinse mouth well after use  . carboxymethylcellulose (REFRESH TEARS) 0.5 % SOLN Place 2 drops into both eyes See admin instructions. 1 to 4 times daily prn  . diclofenac sodium (VOLTAREN) 1 % GEL Apply 4 g topically 4 (four) times daily to lumbar spine for pain  Apply 4  g topically t bilateral hips and knees every 8 hours as needed for pain  . diltiazem (CARDIZEM) 60 MG tablet Take 60 mg by mouth daily.  Marland Kitchen docusate sodium (COLACE) 100 MG capsule Take 1 capsule (100 mg total) by mouth 2 (two) times daily.  Marland Kitchen gabapentin (NEURONTIN) 100 MG capsule Take 200 mg by mouth 2 (two) times daily. 2 caps  . ipratropium-albuterol (DUONEB) 0.5-2.5 (3) MG/3ML SOLN Take 3 mLs by nebulization 3 (three) times daily.  . Liniments (SALONPAS PAIN RELIEF PATCH EX) Lidocain 4% - Apply one patch to Right Flank / sore ribs daily.  Remove after 12 hours  . magnesium hydroxide (MILK OF MAGNESIA) 400 MG/5ML suspension Take 30 mLs by mouth daily as needed for mild constipation.  . methocarbamol (ROBAXIN) 500 MG tablet Take 250 mg by mouth 2 (two) times daily. 1/2 tablet  . montelukast (SINGULAIR) 10 MG tablet Take 10 mg by mouth at bedtime.  Marland Kitchen oxycodone (OXY-IR) 5 MG capsule Take 1 capsule (5 mg total) by mouth every 4 (four) hours as needed. For moderate or severe pain  . OXYGEN Inhale 2 L into the lungs continuous as needed.  . pantoprazole (PROTONIX) 40 MG tablet Take 40 mg by mouth daily.  Vladimir Faster Glycol-Propyl Glycol (SYSTANE) 0.4-0.3 % GEL ophthalmic gel Place 1 application into both eyes at bedtime. (2 drops ) for dry eyes (Ointments are on Backorder)  . polyethylene glycol (MIRALAX) packet Take 17 g by mouth 2 (two) times daily.  . potassium chloride (K-DUR,KLOR-CON) 10 MEQ tablet Take 10 mEq by mouth daily.   . predniSONE (DELTASONE) 5 MG tablet Take 5 mg by mouth daily with breakfast.   . senna-docusate (SENNA-PLUS) 8.6-50 MG tablet Take 2 tablets  by mouth 2 (two) times daily as needed.   . sertraline (ZOLOFT) 100 MG tablet Take 100 mg by mouth daily. 8 pm  . sertraline (ZOLOFT) 50 MG tablet Take 50 mg by mouth at bedtime. 8 pm  . Skin Protectants, Misc. (MINERIN) CREA Apply liberal amount daily to arms and legs after the bath and PRN for skin integrity  . sodium phosphate Pediatric (FLEET) 3.5-9.5 GM/59ML enema Place 1 enema rectally once as needed for severe constipation. Take once every 3 days if constipation is not relieved by Milk of magnesia or Bisacodyl Suppository  . torsemide (DEMADEX) 10 MG tablet Take 10 mg by mouth daily.   Marland Kitchen UNABLE TO FIND Diet Type: NAS  . [DISCONTINUED] Lidocaine 4 % PTCH [place] - Apply one patch to right flank / sore ribs.  Remove after 12 hours  . [DISCONTINUED] diclofenac sodium (VOLTAREN) 1 % GEL Apply 4 g topically 4 (four) times daily as needed (pain). To lumbar spine   No facility-administered encounter medications on file as of 04/16/2018.      SIGNIFICANT DIAGNOSTIC EXAMS   LABS REVIEWED PREVIOUS   02-09-18: wbc 6.5 hgb 11.0; hct 33.1; mcv 94.0 plt 165 10 -31-19: wbc 6.6; hgb 10.3; hct 32.5; mcv 96.4; plt  178; glucose 88; bun 30; creat 1.27; k+ 4.9; na++ 141; ca 8.5  NO NEW LABS.   Review of Systems  Constitutional: Negative for malaise/fatigue.  Respiratory: Negative for cough and shortness of breath.   Cardiovascular: Negative for chest pain, palpitations and leg swelling.  Gastrointestinal: Negative for abdominal pain, constipation and heartburn.  Musculoskeletal: Negative for back pain, joint pain and myalgias.  Skin: Negative.   Neurological: Negative for dizziness.  Psychiatric/Behavioral: The patient is not nervous/anxious.  Physical Exam Constitutional:      General: She is not in acute distress.    Appearance: Normal appearance. She is well-developed. She is not diaphoretic.  Neck:     Musculoskeletal: Neck supple.     Thyroid: No thyromegaly.  Cardiovascular:      Rate and Rhythm: Normal rate and regular rhythm.     Pulses: Normal pulses.     Heart sounds: Normal heart sounds.  Pulmonary:     Effort: Pulmonary effort is normal. No respiratory distress.     Breath sounds: Normal breath sounds.  Abdominal:     General: Bowel sounds are normal. There is no distension.     Palpations: Abdomen is soft.     Tenderness: There is no abdominal tenderness.  Musculoskeletal:     Right lower leg: No edema.     Left lower leg: No edema.     Comments: Unable to move left upper extremity does have splint Left lower extremity is weak Is able to move right extremities.     Lymphadenopathy:     Cervical: No cervical adenopathy.  Skin:    General: Skin is warm and dry.  Neurological:     Mental Status: She is alert. Mental status is at baseline.      ASSESSMENT/ PLAN:  TODAY:   1. Paroxysmal atrial fibrillation: heart rate is stable: will continue asa 81 mg daily cardizem 60 mg daily for rate control. Is off eliquis    2. Other emphysema: is stable will continue breo inhaler daily; duoneb three times daily albuterol inhaler 2 puffs every 4 hours as needed has 02 as needed and flonase daily will continue prednisone 5 mg daily   3. GERD without esophagitis: is stable will continue protonix 40 mg daily and zantac 75 mg twice daily   PREVIOUS   4. Chronic constipation: is stable will continue miralax twice daily colace twice daily   5. Hypertensive chronic kidney disease w stg 1-4/unsp chr kdny: is stable b/p 146/55: will monitor   6. Chronic kidney disease stage III (moderate): is stable will monitor  7. 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 Neuropathic pain: is under control: will continue neurontin 200 mg twice daily   9. Chronic bilateral low back pain without sciatica: is stable will continue tylenol 650 mg four times daily robaxin 250 mg twice daily lidoderm 4% pack to right  flank and right arm daily; has voltaren gel 4 gm to lower back four times as needed;   10. 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 Hypokalemia: is stable will continue k+ 20 meq daily   12. Bilateral lower extremity edema: is stable will continue demadex  10 mg daily   13. Vitamin B12 deficiency anemia due to intrinsic factor deficiency: is stable is off iron and will continue vit B 12  14. Dyslipidemia: stable is off  lipitor    MD is aware of resident's narcotic use and is in agreement with current plan of care. We will attempt to wean resident as apropriate   Ok Edwards NP [facility] Adult Medicine  Contact [phone] Monday through Friday 8am- 5pm  After hours call [phone]

## 2018-04-19 DIAGNOSIS — I69354 Hemiplegia and hemiparesis following cerebral infarction affecting left non-dominant side: Secondary | ICD-10-CM | POA: Diagnosis not present

## 2018-04-19 DIAGNOSIS — M6281 Muscle weakness (generalized): Secondary | ICD-10-CM | POA: Diagnosis not present

## 2018-04-19 DIAGNOSIS — R279 Unspecified lack of coordination: Secondary | ICD-10-CM | POA: Diagnosis not present

## 2018-04-19 DIAGNOSIS — M62422 Contracture of muscle, left upper arm: Secondary | ICD-10-CM | POA: Diagnosis not present

## 2018-04-23 DIAGNOSIS — M6281 Muscle weakness (generalized): Secondary | ICD-10-CM | POA: Diagnosis not present

## 2018-04-23 DIAGNOSIS — R279 Unspecified lack of coordination: Secondary | ICD-10-CM | POA: Diagnosis not present

## 2018-04-23 DIAGNOSIS — M62422 Contracture of muscle, left upper arm: Secondary | ICD-10-CM | POA: Diagnosis not present

## 2018-04-23 DIAGNOSIS — I69354 Hemiplegia and hemiparesis following cerebral infarction affecting left non-dominant side: Secondary | ICD-10-CM | POA: Diagnosis not present

## 2018-04-26 DIAGNOSIS — I69354 Hemiplegia and hemiparesis following cerebral infarction affecting left non-dominant side: Secondary | ICD-10-CM | POA: Diagnosis not present

## 2018-04-26 DIAGNOSIS — R279 Unspecified lack of coordination: Secondary | ICD-10-CM | POA: Diagnosis not present

## 2018-04-26 DIAGNOSIS — M62422 Contracture of muscle, left upper arm: Secondary | ICD-10-CM | POA: Diagnosis not present

## 2018-04-26 DIAGNOSIS — M6281 Muscle weakness (generalized): Secondary | ICD-10-CM | POA: Diagnosis not present

## 2018-04-29 DIAGNOSIS — M6281 Muscle weakness (generalized): Secondary | ICD-10-CM | POA: Diagnosis not present

## 2018-04-29 DIAGNOSIS — M62422 Contracture of muscle, left upper arm: Secondary | ICD-10-CM | POA: Diagnosis not present

## 2018-04-29 DIAGNOSIS — R279 Unspecified lack of coordination: Secondary | ICD-10-CM | POA: Diagnosis not present

## 2018-04-29 DIAGNOSIS — I69354 Hemiplegia and hemiparesis following cerebral infarction affecting left non-dominant side: Secondary | ICD-10-CM | POA: Diagnosis not present

## 2018-04-30 ENCOUNTER — Other Ambulatory Visit: Payer: Self-pay | Admitting: Adult Health

## 2018-04-30 DIAGNOSIS — R279 Unspecified lack of coordination: Secondary | ICD-10-CM | POA: Diagnosis not present

## 2018-04-30 DIAGNOSIS — M62422 Contracture of muscle, left upper arm: Secondary | ICD-10-CM | POA: Diagnosis not present

## 2018-04-30 DIAGNOSIS — M6281 Muscle weakness (generalized): Secondary | ICD-10-CM | POA: Diagnosis not present

## 2018-04-30 DIAGNOSIS — I69354 Hemiplegia and hemiparesis following cerebral infarction affecting left non-dominant side: Secondary | ICD-10-CM | POA: Diagnosis not present

## 2018-04-30 MED ORDER — OXYCODONE HCL 5 MG PO CAPS: 5.0000 mg | ORAL_CAPSULE | ORAL | 0 refills | Status: DC | PRN

## 2018-05-03 ENCOUNTER — Other Ambulatory Visit: Payer: Self-pay | Admitting: Adult Health

## 2018-05-03 DIAGNOSIS — M6281 Muscle weakness (generalized): Secondary | ICD-10-CM | POA: Diagnosis not present

## 2018-05-03 DIAGNOSIS — I69354 Hemiplegia and hemiparesis following cerebral infarction affecting left non-dominant side: Secondary | ICD-10-CM | POA: Diagnosis not present

## 2018-05-03 DIAGNOSIS — R279 Unspecified lack of coordination: Secondary | ICD-10-CM | POA: Diagnosis not present

## 2018-05-03 DIAGNOSIS — M62422 Contracture of muscle, left upper arm: Secondary | ICD-10-CM | POA: Diagnosis not present

## 2018-05-07 ENCOUNTER — Other Ambulatory Visit: Payer: Self-pay | Admitting: Adult Health

## 2018-05-07 MED ORDER — OXYCODONE HCL 5 MG PO CAPS: 5.0000 mg | ORAL_CAPSULE | ORAL | 0 refills | Status: DC | PRN

## 2018-05-17 ENCOUNTER — Non-Acute Institutional Stay (SKILLED_NURSING_FACILITY): Payer: Medicare Other | Admitting: Adult Health

## 2018-05-17 ENCOUNTER — Encounter: Payer: Self-pay | Admitting: Adult Health

## 2018-05-17 DIAGNOSIS — J0110 Acute frontal sinusitis, unspecified: Secondary | ICD-10-CM | POA: Diagnosis not present

## 2018-05-17 DIAGNOSIS — J209 Acute bronchitis, unspecified: Secondary | ICD-10-CM | POA: Diagnosis not present

## 2018-05-17 NOTE — Progress Notes (Signed)
[facility]   [facility] Room Number: [place] of Service:  SNF (31)   CODE STATUS: DNR  Allergies  Allergen Reactions  . Iodinated Diagnostic Agents Itching and Swelling    Other reaction(s): Unknown  . Nitrofurantoin Diarrhea    Other reaction(s): Unknown  . Omnipaque [Iohexol]   . Solifenacin Other (See Comments)    indigestion  . Ciprofloxacin Other (See Comments) and Rash    Colitis  . Metronidazole Itching and Rash  . Sulfa Antibiotics Itching and Rash    Chief Complaint  Patient presents with  . Acute Visit    Low Blood Pressure / Nausea    HPI:  Staff report that her blood pressure is low; manually however her pressure is 144/50. She is complaining of sinus congestion with cough and yellow sputum; she has a cough with shortness of breath. There are no reports of fevers present.   Past Medical History:  Diagnosis Date  . Adenoma of rectum   . Adrenal adenoma   . Anemia   . Anemia, unspecified   . Arthritis   . Ascending aortic aneurysm ([place])    a. s/p repair 07/2014  . Atrophic vaginitis   . Benign neoplasm of colon, unspecified   . COPD (chronic obstructive pulmonary disease) ([place])   . Coronary artery disease, non-occlusive    a. LHC 06/2014 without evidence of obstructive disease  . Depression   . Diverticulosis   . Dysphagia   . Dysrhythmia   . Essential hypertension    benign  . Frequent UTI   . GERD (gastroesophageal reflux disease)   . Hemiplegia and hemiparesis    following cerebral infarction affecting left non-dominant side  . Hypertension   . Microscopic hematuria   . Nonrheumatic aortic valve insufficiency   . Osteoporosis    unspecified  . Panic attacks   . Paroxysmal atrial fibrillation (HCC)   . Pernicious anemia   . Renal cyst    CKD STAGE 3  . Stroke ([place]) 07/23/2014   a. post-operative setting in 07/2014 leading to left UE hemiapresis and left LE weakness  . Urge incontinence     Past Surgical  History:  Procedure Laterality Date  . ABDOMINAL HYSTERECTOMY  1972  . AORTOILIAC BYPASS    . ASCENDING AORTIC ANEURYSM REPAIR  07/23/2014   07/2014  . BLEPHAROPLASTY    . CATARACT EXTRACTION  2005  . GASTROSTOMY W/ FEEDING TUBE    . INTRAMEDULLARY (IM) NAIL INTERTROCHANTERIC Left 02/26/2016   Procedure: INTRAMEDULLARY (IM) NAIL INTERTROCHANTRIC;  Surgeon: Earnestine Leys, MD;  Location: [facility] ORS;  Service: Orthopedics;  Laterality: Left;  . KYPHOPLASTY N/A 08/26/2016   Procedure: KYPHOPLASTY T12;  Surgeon: Hessie Knows, MD;  Location: [facility] ORS;  Service: Orthopedics;  Laterality: N/A;  . LEFT HEART CATH  06/2014  . PR ASCEND AORTIC GRAFT INCL VALVE SUSPENSION  07/07/2014   Procedure: ASCENDING AORTA GRAFT, WITH CARDIOPULMONARY BYPASS, WITH OR WITHOUT VALVE SUSPENSION; Surgeon: Dwaine Deter, MD; Location: MAIN OR [facility]; Service: Cardiothoracic  . PR LAP, GASTROTOMY W/O TUBE CONSTR  08/08/2014   Procedure: LAPAROSCOPY, SURGICAL; GASTOSTOMY W/O CONSTRUCTION OF GASTRIC TUBE (EG, STAMM PROCEDURE)(SEPARATE PROCED); Surgeon: Fredrik Rigger, MD; Location: MAIN OR [facility]; Service: Gastrointestinal  . PR PLACE PERCUT GASTROSTOMY TUBE  08/08/2014   Procedure: UGI ENDO; W/DIRECTED PLCMT PERQ GASTROSTOMY TUBE; Surgeon: Fredrik Rigger, MD; Location: MAIN OR [facility]; Service: Gastrointestinal    Social History   Socioeconomic History  . Marital status: Widowed  Spouse name: Not on file  . Number of children: 3  . Years of education: 70  . Highest education level: Not on file  Occupational History  . Not on file  Social Needs  . Financial resource strain: Not on file  . Food insecurity:    Worry: Not on file    Inability: Not on file  . Transportation needs:    Medical: Not on file    Non-medical: Not on file  Tobacco Use  . Smoking status: Former Smoker    Packs/day: 0.50    Years: 50.00    Pack years: 25.00    Types: Cigarettes    Last attempt to quit: 05/30/2014     Years since quitting: 3.9  . Smokeless tobacco: Never Used  Substance and Sexual Activity  . Alcohol use: No    Comment: occasionally  . Drug use: No  . Sexual activity: Not on file  Lifestyle  . Physical activity:    Days per week: Not on file    Minutes per session: Not on file  . Stress: Not on file  Relationships  . Social connections:    Talks on phone: Not on file    Gets together: Not on file    Attends religious service: Not on file    Active member of club or organization: Not on file    Attends meetings of clubs or organizations: Not on file    Relationship status: Not on file  . Intimate partner violence:    Fear of current or ex partner: Not on file    Emotionally abused: Not on file    Physically abused: Not on file    Forced sexual activity: Not on file  Other Topics Concern  . Not on file  Social History Narrative   Admitted to [place] 03/01/2016   Widowed   4 children   Former Smoker   Occasionally drinks   DNR   Family History  Problem Relation Age of Onset  . Lung cancer Son   . CAD Father   . Heart attack Father   . CAD Brother   . Heart attack Brother   . Stroke Mother       VITAL SIGNS BP (!) 144/50   Pulse 65   Temp 98 F (36.7 C)   Resp 19   Ht 5\' 1"  (1.549 m)   Wt 141 lb 14.4 oz (64.4 kg)   SpO2 96%   BMI 26.81 kg/m   Outpatient Encounter Medications as of 05/17/2018  Medication Sig  . acetaminophen (TYLENOL) 325 MG tablet Take 650 mg by mouth 4 (four) times daily.   Marland Kitchen albuterol (PROVENTIL HFA;VENTOLIN HFA) 108 (90 Base) MCG/ACT inhaler Inhale 2 puffs into the lungs every 4 (four) hours as needed for wheezing or shortness of breath.  Marland Kitchen alum & mag hydroxide-simeth (MAALOX PLUS) 400-400-40 MG/5ML suspension Take 30 mLs by mouth every 4 (four) hours as needed.  Marland Kitchen aspirin 81 MG tablet Take 81 mg by mouth daily.   Marland Kitchen BREO ELLIPTA 100-25 MCG/INH AEPB Inhale 1 puff into the lungs daily. Rinse mouth well after use  .  carboxymethylcellulose (REFRESH TEARS) 0.5 % SOLN Place 2 drops into both eyes See admin instructions. 1 to 4 times daily prn  . diclofenac sodium (VOLTAREN) 1 % GEL Apply 4 g topically 4 (four) times daily to lumbar spine for pain  Apply 4 g topically t bilateral hips and knees every 8 hours as needed for pain  .  diltiazem (CARDIZEM) 60 MG tablet Take 60 mg by mouth daily.  Marland Kitchen docusate sodium (COLACE) 100 MG capsule Take 1 capsule (100 mg total) by mouth 2 (two) times daily.  Marland Kitchen gabapentin (NEURONTIN) 100 MG capsule Take 200 mg by mouth 2 (two) times daily. 2 caps  . ipratropium-albuterol (DUONEB) 0.5-2.5 (3) MG/3ML SOLN Take 3 mLs by nebulization 3 (three) times daily.  . Liniments (SALONPAS PAIN RELIEF PATCH EX) Lidocain 4% - Apply one patch to Right Flank / sore ribs daily.  Remove after 12 hours  . magnesium hydroxide (MILK OF MAGNESIA) 400 MG/5ML suspension Take 30 mLs by mouth daily as needed for mild constipation.  . methocarbamol (ROBAXIN) 500 MG tablet Take 250 mg by mouth 2 (two) times daily. 1/2 tablet  . montelukast (SINGULAIR) 10 MG tablet Take 10 mg by mouth at bedtime.  Marland Kitchen oxycodone (OXY-IR) 5 MG capsule Take 1 capsule (5 mg total) by mouth every 4 (four) hours as needed. For moderate or severe pain  . OXYGEN Inhale 2 L into the lungs continuous as needed.  . pantoprazole (PROTONIX) 40 MG tablet Take 40 mg by mouth daily.  Vladimir Faster Glycol-Propyl Glycol (SYSTANE) 0.4-0.3 % GEL ophthalmic gel Place 1 application into both eyes at bedtime. (2 drops ) for dry eyes (Ointments are on Backorder)  . polyethylene glycol (MIRALAX) packet Take 17 g by mouth 2 (two) times daily.  . potassium chloride (K-DUR,KLOR-CON) 10 MEQ tablet Take 10 mEq by mouth daily.   . predniSONE (DELTASONE) 5 MG tablet Take 5 mg by mouth daily with breakfast.   . senna-docusate (SENNA-PLUS) 8.6-50 MG tablet Take 2 tablets by mouth 2 (two) times daily as needed.   . sertraline (ZOLOFT) 100 MG tablet Take 100 mg by  mouth daily. 8 pm  . sertraline (ZOLOFT) 50 MG tablet Take 50 mg by mouth at bedtime. 8 pm  . Skin Protectants, Misc. (MINERIN) CREA Apply liberal amount daily to arms and legs after the bath and PRN for skin integrity  . sodium phosphate Pediatric (FLEET) 3.5-9.5 GM/59ML enema Place 1 enema rectally once as needed for severe constipation. Take once every 3 days if constipation is not relieved by Milk of magnesia or Bisacodyl Suppository  . torsemide (DEMADEX) 10 MG tablet Take 10 mg by mouth daily.   Marland Kitchen UNABLE TO FIND Diet Type: NAS   No facility-administered encounter medications on file as of 05/17/2018.      SIGNIFICANT DIAGNOSTIC EXAMS  LABS REVIEWED PREVIOUS   02-09-18: wbc 6.5 hgb 11.0; hct 33.1; mcv 94.0 plt 165 10 -31-19: wbc 6.6; hgb 10.3; hct 32.5; mcv 96.4; plt  178; glucose 88; bun 30; creat 1.27; k+ 4.9; na++ 141; ca 8.5  NO NEW LABS.    Review of Systems  Constitutional: Positive for malaise/fatigue. Negative for fever.  HENT: Positive for congestion.   Respiratory: Positive for cough, sputum production and shortness of breath.   Cardiovascular: Negative for chest pain, palpitations and leg swelling.  Gastrointestinal: Negative for abdominal pain, constipation and heartburn.  Musculoskeletal: Negative for back pain, joint pain and myalgias.  Skin: Negative.   Neurological: Negative for dizziness.  Psychiatric/Behavioral: The patient is not nervous/anxious.     Physical Exam Constitutional:      General: She is not in acute distress.    Appearance: She is well-developed. She is not diaphoretic.  HENT:     Nose: Congestion present.     Mouth/Throat:     Pharynx: No oropharyngeal exudate.  Neck:  Thyroid: No thyromegaly.  Cardiovascular:     Rate and Rhythm: Normal rate and regular rhythm.     Heart sounds: Normal heart sounds.  Pulmonary:     Effort: Pulmonary effort is normal. No respiratory distress.     Comments: Has rales and rhonchi in upper lobes    Abdominal:     General: Bowel sounds are normal. There is no distension.     Palpations: Abdomen is soft.     Tenderness: There is no abdominal tenderness.  Musculoskeletal:     Right lower leg: No edema.     Left lower leg: No edema.     Comments:  Unable to move left upper extremity does have splint Left lower extremity is weak Is able to move right extremities.      Lymphadenopathy:     Cervical: Cervical adenopathy present.  Skin:    General: Skin is warm and dry.  Neurological:     Mental Status: She is alert and oriented to person, place, and time.         ASSESSMENT/ PLAN:  TODAY:   1.  Acute sinusitis 2. Acute bronchitis  Will begin augmentin 875 mg twice daily through 05-27-18 Will begin mucinex 600 gm twice daily through 06-01-18 Will monitor her status.      MD is aware of resident's narcotic use and is in agreement with current plan of care. We will attempt to wean resident as apropriate   Ok Edwards NP [facility] Adult Medicine  Contact [phone] Monday through Friday 8am- 5pm  After hours call [phone]

## 2018-05-21 ENCOUNTER — Encounter: Payer: Self-pay | Admitting: Adult Health

## 2018-05-21 ENCOUNTER — Non-Acute Institutional Stay (SKILLED_NURSING_FACILITY): Payer: Medicare Other | Admitting: Adult Health

## 2018-05-21 DIAGNOSIS — F325 Major depressive disorder, single episode, in full remission: Secondary | ICD-10-CM

## 2018-05-21 DIAGNOSIS — N183 Chronic kidney disease, stage 3 unspecified: Secondary | ICD-10-CM

## 2018-05-21 DIAGNOSIS — K5909 Other constipation: Secondary | ICD-10-CM | POA: Diagnosis not present

## 2018-05-21 DIAGNOSIS — I129 Hypertensive chronic kidney disease with stage 1 through stage 4 chronic kidney disease, or unspecified chronic kidney disease: Secondary | ICD-10-CM | POA: Diagnosis not present

## 2018-05-21 NOTE — Progress Notes (Signed)
Location:   [facility] Room Number: [place] of Service:  SNF (31)   CODE STATUS: DNR  Allergies  Allergen Reactions  . Iodinated Diagnostic Agents Itching and Swelling    Other reaction(s): Unknown  . Nitrofurantoin Diarrhea    Other reaction(s): Unknown  . Omnipaque [Iohexol]   . Solifenacin Other (See Comments)    indigestion  . Ciprofloxacin Other (See Comments) and Rash    Colitis  . Metronidazole Itching and Rash  . Sulfa Antibiotics Itching and Rash    Chief Complaint  Patient presents with  . Medical Management of Chronic Issues    Hypertensive chronic kidney disease w stg 1-4/unsp chr kdny; chronic kidney disease stage III (moderate) chronic constipation; major depressive disorder, single episode in full remission.     HPI:  She is a 85 year old long term resident of this facility being seen for the management of her chronic illnesses: hypertensive heart disease; ckd stage 3; constipation major depression. She denies any constipation or heart burn. She denies any cough or shortness of breath. She is presently begin treated for bronchitis and acute sinusitis.   Past Medical History:  Diagnosis Date  . Adenoma of rectum   . Adrenal adenoma   . Anemia   . Anemia, unspecified   . Arthritis   . Ascending aortic aneurysm ([place])    a. s/p repair 07/2014  . Atrophic vaginitis   . Benign neoplasm of colon, unspecified   . COPD (chronic obstructive pulmonary disease) ([place])   . Coronary artery disease, non-occlusive    a. LHC 06/2014 without evidence of obstructive disease  . Depression   . Diverticulosis   . Dysphagia   . Dysrhythmia   . Essential hypertension    benign  . Frequent UTI   . GERD (gastroesophageal reflux disease)   . Hemiplegia and hemiparesis    following cerebral infarction affecting left non-dominant side  . Hypertension   . Microscopic hematuria   . Nonrheumatic aortic valve insufficiency   . Osteoporosis    unspecified  . Panic attacks   . Paroxysmal atrial fibrillation ([facility])   . Pernicious anemia   . Renal cyst    CKD STAGE 3  . Stroke ([place]) 07/23/2014   a. post-operative setting in 07/2014 leading to left UE hemiapresis and left LE weakness  . Urge incontinence     Past Surgical History:  Procedure Laterality Date  . ABDOMINAL HYSTERECTOMY  1972  . AORTOILIAC BYPASS    . ASCENDING AORTIC ANEURYSM REPAIR  07/23/2014   07/2014  . BLEPHAROPLASTY    . CATARACT EXTRACTION  2005  . GASTROSTOMY W/ FEEDING TUBE    . INTRAMEDULLARY (IM) NAIL INTERTROCHANTERIC Left 02/26/2016   Procedure: INTRAMEDULLARY (IM) NAIL INTERTROCHANTRIC;  Surgeon: Earnestine Leys, MD;  Location: [facility] ORS;  Service: Orthopedics;  Laterality: Left;  . KYPHOPLASTY N/A 08/26/2016   Procedure: KYPHOPLASTY T12;  Surgeon: Hessie Knows, MD;  Location: [facility] ORS;  Service: Orthopedics;  Laterality: N/A;  . LEFT HEART CATH  06/2014  . PR ASCEND AORTIC GRAFT INCL VALVE SUSPENSION  07/07/2014   Procedure: ASCENDING AORTA GRAFT, WITH CARDIOPULMONARY BYPASS, WITH OR WITHOUT VALVE SUSPENSION; Surgeon: Dwaine Deter, MD; Location: MAIN OR [facility]; Service: Cardiothoracic  . PR LAP, GASTROTOMY W/O TUBE CONSTR  08/08/2014   Procedure: LAPAROSCOPY, SURGICAL; GASTOSTOMY W/O CONSTRUCTION OF GASTRIC TUBE (EG, STAMM PROCEDURE)(SEPARATE PROCED); Surgeon: Fredrik Rigger, MD; Location: MAIN OR [facility]; Service: Gastrointestinal  . PR PLACE PERCUT GASTROSTOMY  TUBE  08/08/2014   Procedure: UGI ENDO; W/DIRECTED PLCMT PERQ GASTROSTOMY TUBE; Surgeon: Fredrik Rigger, MD; Location: MAIN OR [facility]; Service: Gastrointestinal    Social History   Socioeconomic History  . Marital status: Widowed    Spouse name: Not on file  . Number of children: 3  . Years of education: 64  . Highest education level: Not on file  Occupational History  . Not on file  Social Needs  . Financial resource strain: Not on file  . Food insecurity:    Worry:  Not on file    Inability: Not on file  . Transportation needs:    Medical: Not on file    Non-medical: Not on file  Tobacco Use  . Smoking status: Former Smoker    Packs/day: 0.50    Years: 50.00    Pack years: 25.00    Types: Cigarettes    Last attempt to quit: 05/30/2014    Years since quitting: 3.9  . Smokeless tobacco: Never Used  Substance and Sexual Activity  . Alcohol use: No    Comment: occasionally  . Drug use: No  . Sexual activity: Not on file  Lifestyle  . Physical activity:    Days per week: Not on file    Minutes per session: Not on file  . Stress: Not on file  Relationships  . Social connections:    Talks on phone: Not on file    Gets together: Not on file    Attends religious service: Not on file    Active member of club or organization: Not on file    Attends meetings of clubs or organizations: Not on file    Relationship status: Not on file  . Intimate partner violence:    Fear of current or ex partner: Not on file    Emotionally abused: Not on file    Physically abused: Not on file    Forced sexual activity: Not on file  Other Topics Concern  . Not on file  Social History Narrative   Admitted to [place] 03/01/2016   Widowed   4 children   Former Smoker   Occasionally drinks   DNR   Family History  Problem Relation Age of Onset  . Lung cancer Son   . CAD Father   . Heart attack Father   . CAD Brother   . Heart attack Brother   . Stroke Mother       VITAL SIGNS BP (!) 156/84   Pulse 70   Temp 97.6 F (36.4 C)   Resp 20   Ht 5\' 1"  (1.549 m)   Wt 140 lb 12.8 oz (63.9 kg)   SpO2 93%   BMI 26.60 kg/m   Outpatient Encounter Medications as of 05/21/2018  Medication Sig  . acetaminophen (TYLENOL) 325 MG tablet Take 650 mg by mouth 4 (four) times daily.   Marland Kitchen albuterol (PROVENTIL HFA;VENTOLIN HFA) 108 (90 Base) MCG/ACT inhaler Inhale 2 puffs into the lungs every 4 (four) hours as needed for wheezing or shortness of breath.    Marland Kitchen alum & mag hydroxide-simeth (MAALOX PLUS) 400-400-40 MG/5ML suspension Take 30 mLs by mouth every 4 (four) hours as needed.  Marland Kitchen amoxicillin-clavulanate (AUGMENTIN) 875-125 MG tablet Take 1 tablet by mouth 2 (two) times daily.  Marland Kitchen aspirin 81 MG tablet Take 81 mg by mouth daily.   Marland Kitchen BREO ELLIPTA 100-25 MCG/INH AEPB Inhale 1 puff into the lungs daily. Rinse mouth well after use  . carboxymethylcellulose (  REFRESH TEARS) 0.5 % SOLN Place 2 drops into both eyes See admin instructions. 1 to 4 times daily prn  . diclofenac sodium (VOLTAREN) 1 % GEL Apply 4 g topically 4 (four) times daily to lumbar spine for pain  Apply 4 g topically t bilateral hips and knees every 8 hours as needed for pain  . diltiazem (CARDIZEM) 60 MG tablet Take 60 mg by mouth daily.  Marland Kitchen docusate sodium (COLACE) 100 MG capsule Take 1 capsule (100 mg total) by mouth 2 (two) times daily.  Marland Kitchen gabapentin (NEURONTIN) 100 MG capsule Take 200 mg by mouth 2 (two) times daily. 2 caps  . guaiFENesin (MUCINEX) 600 MG 12 hr tablet Take 600 mg by mouth 2 (two) times daily.  Marland Kitchen ipratropium-albuterol (DUONEB) 0.5-2.5 (3) MG/3ML SOLN Take 3 mLs by nebulization 3 (three) times daily.  . Liniments (SALONPAS PAIN RELIEF PATCH EX) Lidocain 4% - Apply one patch to Right Flank / sore ribs daily.  Remove after 12 hours  . magnesium hydroxide (MILK OF MAGNESIA) 400 MG/5ML suspension Take 30 mLs by mouth daily as needed for mild constipation.  . methocarbamol (ROBAXIN) 500 MG tablet Take 250 mg by mouth 2 (two) times daily. 1/2 tablet  . montelukast (SINGULAIR) 10 MG tablet Take 10 mg by mouth at bedtime.  Marland Kitchen oxycodone (OXY-IR) 5 MG capsule Take 1 capsule (5 mg total) by mouth every 4 (four) hours as needed. For moderate or severe pain  . OXYGEN Inhale 2 L into the lungs continuous as needed.  . pantoprazole (PROTONIX) 40 MG tablet Take 40 mg by mouth daily.  Vladimir Faster Glycol-Propyl Glycol (SYSTANE) 0.4-0.3 % GEL ophthalmic gel Place 1 application into both  eyes at bedtime. (2 drops ) for dry eyes (Ointments are on Backorder)  . polyethylene glycol (MIRALAX) packet Take 17 g by mouth 2 (two) times daily.  . potassium chloride (K-DUR,KLOR-CON) 10 MEQ tablet Take 10 mEq by mouth daily.   . predniSONE (DELTASONE) 5 MG tablet Take 5 mg by mouth daily with breakfast.   . senna-docusate (SENNA-PLUS) 8.6-50 MG tablet Take 2 tablets by mouth 2 (two) times daily as needed.   . sertraline (ZOLOFT) 100 MG tablet Take 100 mg by mouth daily. 8 pm  . sertraline (ZOLOFT) 50 MG tablet Take 50 mg by mouth at bedtime. 8 pm  . Skin Protectants, Misc. (MINERIN) CREA Apply liberal amount daily to arms and legs after the bath and PRN for skin integrity  . sodium phosphate Pediatric (FLEET) 3.5-9.5 GM/59ML enema Place 1 enema rectally once as needed for severe constipation. Take once every 3 days if constipation is not relieved by Milk of magnesia or Bisacodyl Suppository  . torsemide (DEMADEX) 10 MG tablet Take 10 mg by mouth daily.   Marland Kitchen UNABLE TO FIND Diet Type: NAS   No facility-administered encounter medications on file as of 05/21/2018.      SIGNIFICANT DIAGNOSTIC EXAMS   LABS REVIEWED PREVIOUS   02-09-18: wbc 6.5 hgb 11.0; hct 33.1; mcv 94.0 plt 165 10 -31-19: wbc 6.6; hgb 10.3; hct 32.5; mcv 96.4; plt  178; glucose 88; bun 30; creat 1.27; k+ 4.9; na++ 141; ca 8.5  NO NEW LABS.   Review of Systems  Constitutional: Negative for malaise/fatigue.  Respiratory: Negative for cough and shortness of breath.   Cardiovascular: Negative for chest pain, palpitations and leg swelling.  Gastrointestinal: Negative for abdominal pain, constipation and heartburn.  Musculoskeletal: Negative for back pain, joint pain and myalgias.  Skin: Negative.  Neurological: Negative for dizziness.  Psychiatric/Behavioral: The patient is not nervous/anxious.      Physical Exam Constitutional:      General: She is not in acute distress.    Appearance: She is well-developed. She  is not diaphoretic.  Neck:     Musculoskeletal: Neck supple.     Thyroid: No thyromegaly.  Cardiovascular:     Rate and Rhythm: Normal rate and regular rhythm.     Pulses: Normal pulses.     Heart sounds: Normal heart sounds.  Pulmonary:     Effort: Pulmonary effort is normal. No respiratory distress.     Breath sounds: Normal breath sounds.  Abdominal:     General: Bowel sounds are normal. There is no distension.     Palpations: Abdomen is soft.     Tenderness: There is no abdominal tenderness.  Musculoskeletal:     Right lower leg: No edema.     Left lower leg: No edema.     Comments: Unable to move left upper extremity does have splint Left lower extremity is weak Is able to move right extremities.    Lymphadenopathy:     Cervical: No cervical adenopathy.  Skin:    General: Skin is warm and dry.  Neurological:     Mental Status: She is alert and oriented to person, place, and time.     ASSESSMENT/ PLAN:  TODAY:   1. Chronic constipation: is stable will continue miralax twice daily colace twice daily   2. Hypertensive chronic kidney disease w stg 1-4/unsp chr kdny: is stable b/p 156/84: will monitor   3. Chronic kidney disease stage III (moderate): is stable bun 30 creat 1.27 will monitor  4. Major depressive disorder, single episode in full remission: is stable will continue zoloft 150 mg daily   PREVIOUS   5. Neuropathic pain: is under control: will continue neurontin 200 mg twice daily   6. Chronic bilateral low back pain without sciatica: is stable will continue tylenol 650 mg four times daily robaxin 250 mg twice daily lidoderm 4% pack to right flank and right arm daily; has voltaren gel 4 gm to lower back four times daily and 4 gm every 8 hours as needed to knees   7. Chronic cerebrovascular accident with hemiplegia and hemiparesis following cerebral infarction affecting left non-dominant side: is neurologically stable will continue asa 81 mg daily   8.  Hypokalemia: is stable will continue k+ 10 meq daily   9. Bilateral lower extremity edema: is stable will continue demadex  10 mg daily   10. Vitamin B12 deficiency anemia due to intrinsic factor deficiency: is stable is off iron and will continue vit B 12  11. Dyslipidemia: stable is off  lipitor   12. Paroxysmal atrial fibrillation: heart rate is stable: will continue asa 81 mg daily cardizem 60 mg daily for rate control. Is off eliquis    13. Other emphysema: is stable will continue breo inhaler daily; duoneb three times daily albuterol inhaler 2 puffs every 4 hours as needed has 02 as needed and flonase daily will continue prednisone 5 mg daily   14. GERD without esophagitis: is stable will continue protonix 40 mg daily     MD is aware of resident's narcotic use and is in agreement with current plan of care. We will attempt to wean resident as apropriate   Ok Edwards NP [facility]  Contact [phone] Monday through Friday 8am- 5pm  After hours call [phone]

## 2018-05-23 IMAGING — RF DG SWALLOWING FUNCTION
13 series · 13 of 24 positions shown · non-contrast
Comparison: Modified barium swallow September 14, 2014

CLINICAL DATA: Difficulty swallowing large pills but otherwise no
significant problems. History of gastroesophageal reflux not
currently well controlled

EXAM:
MODIFIED BARIUM SWALLOW
TECHNIQUE: Different consistencies of barium were administered orally to the
patient by the Speech Pathologist. Imaging of the pharynx was
performed in the lateral projection.
FLUOROSCOPY TIME:  Fluoroscopy Time:  1 minutes, 18 seconds
Number of Acquired Images:  10 video loops

[Series 1: run · 1 of 26 frames shown (1 of 13)]
[frame 4/26]
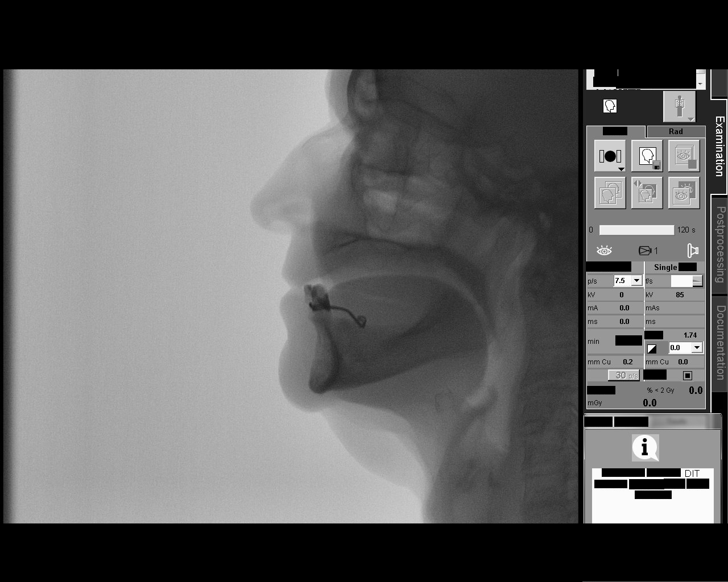

[Series 2: run · 1 of 214 frames shown (2 of 13)]
[frame 108/214]
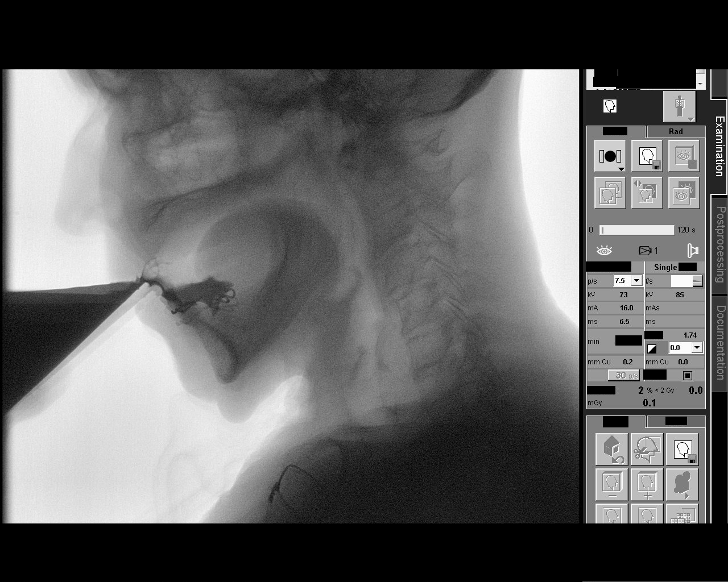

[Series 3: run · 1 of 325 frames shown (3 of 13)]
[frame 149/325]
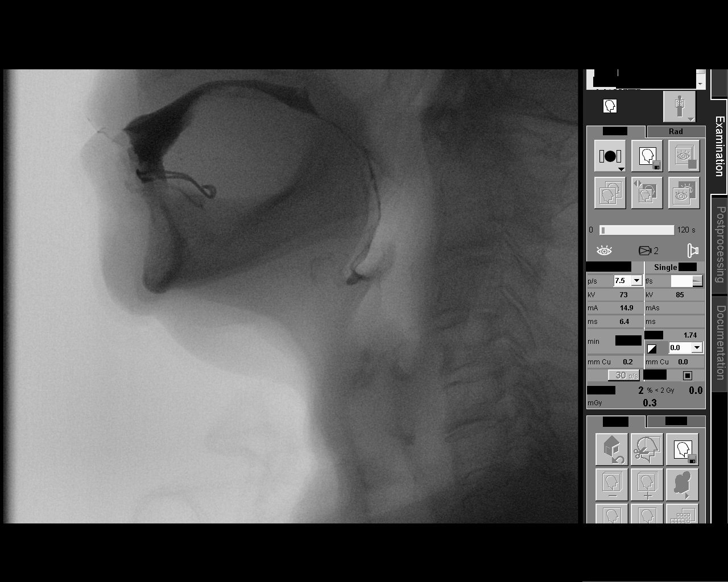

[Series 4: run · 1 of 136 frames shown (4 of 13)]
[frame 116/136]
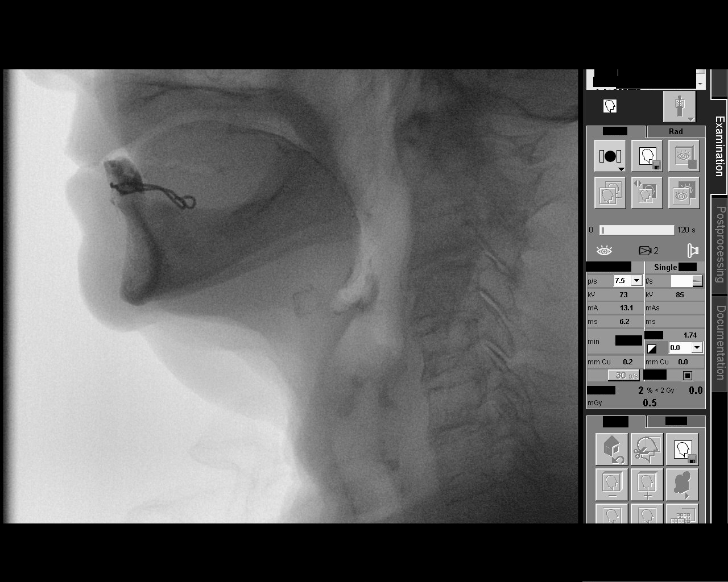

[Series 5: run · 1 of 275 frames shown (5 of 13)]
[frame 234/275]
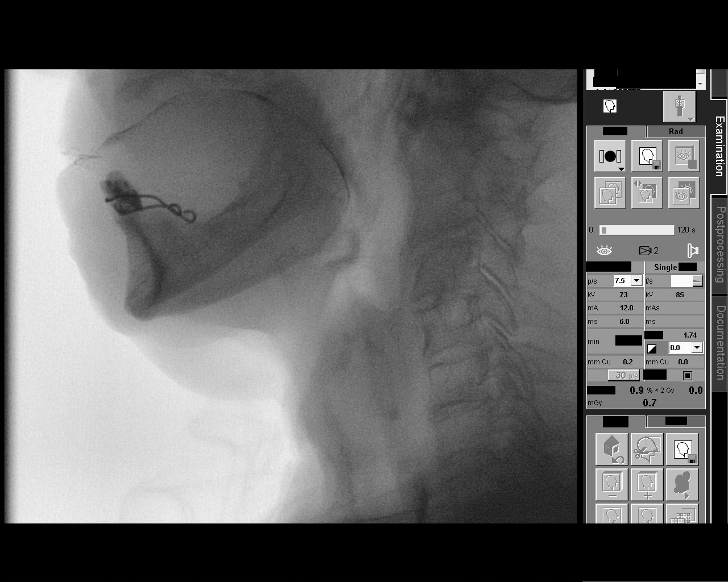

[Series 6: run · 1 of 199 frames shown (6 of 13)]
[frame 170/199]
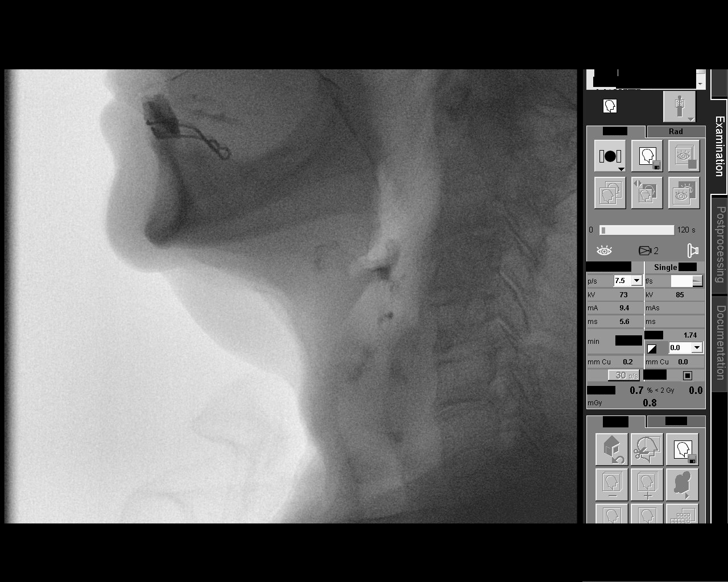

[Series 7: run · 1 of 303 frames shown (7 of 13)]
[frame 258/303]
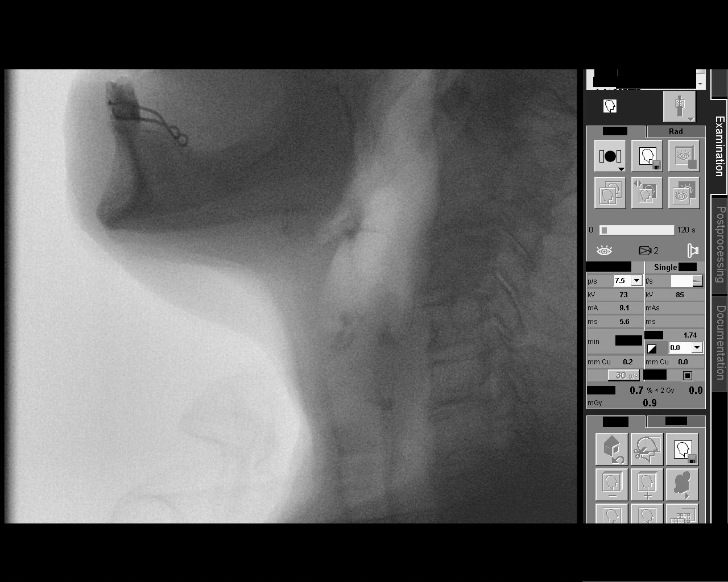

[Series 8: run · 1 of 157 frames shown (8 of 13)]
[frame 79/157]
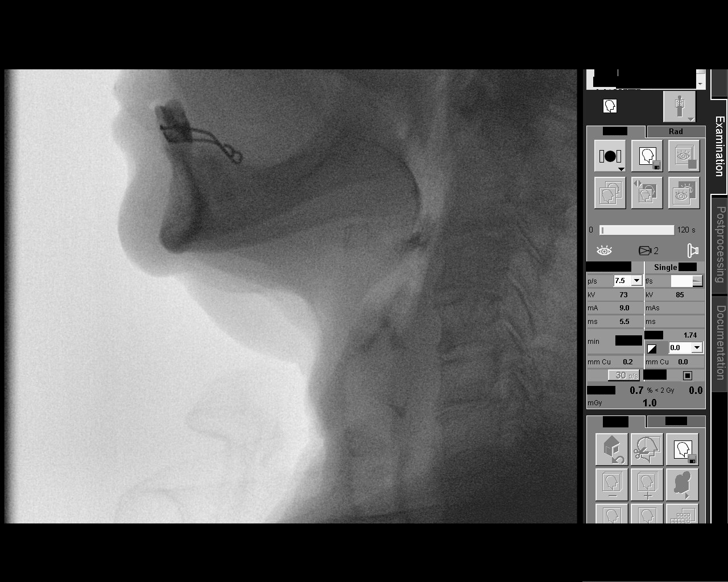

[Series 9: run · 1 of 418 frames shown (9 of 13)]
[frame 356/418]
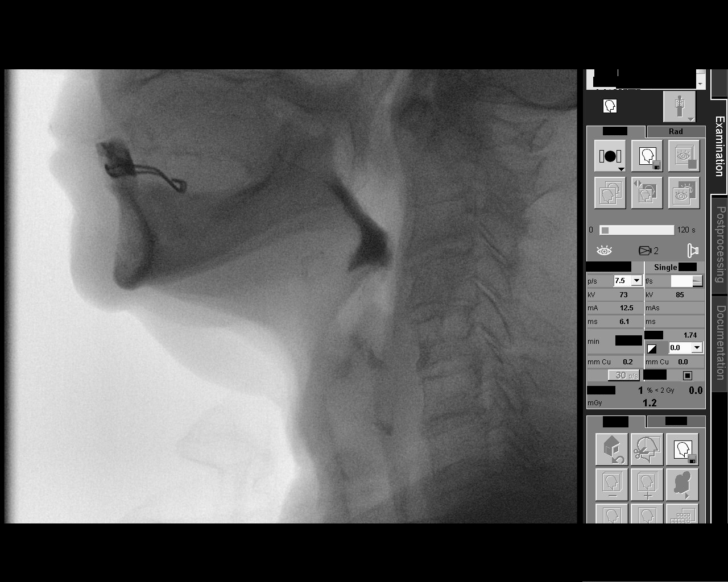

[Series 10: run · 1 of 113 frames shown (10 of 13)]
[frame 57/113]
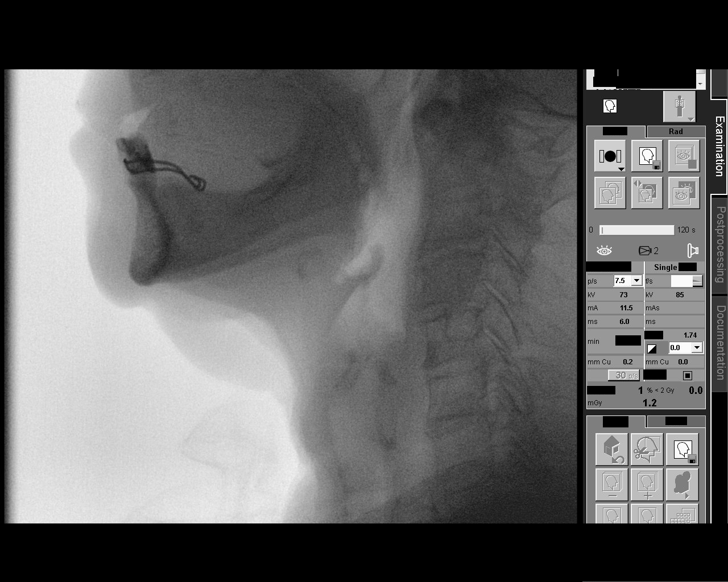

[Series 11: run · 1 of 188 frames shown (11 of 13)]
[frame 160/188]
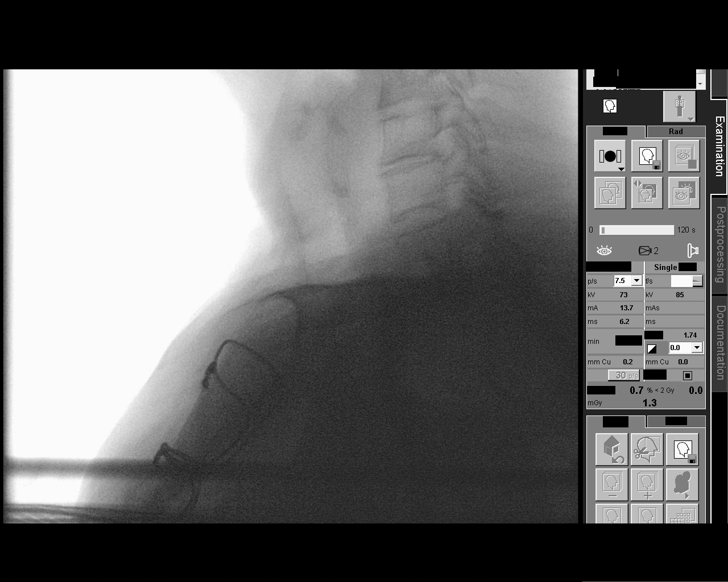

[Series 12: run · 1 of 9 frames shown (12 of 13)]
[frame 8/9]
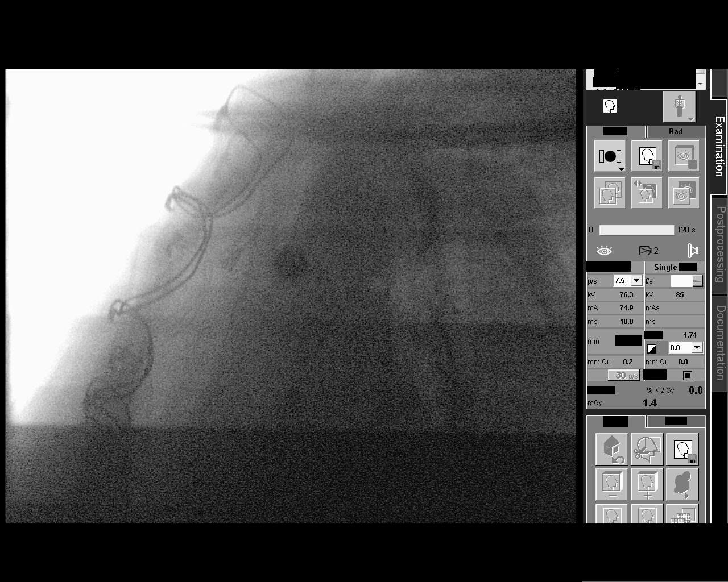

[Series 13: run · 1 of 7 frames shown (13 of 13)]
[frame 6/7]
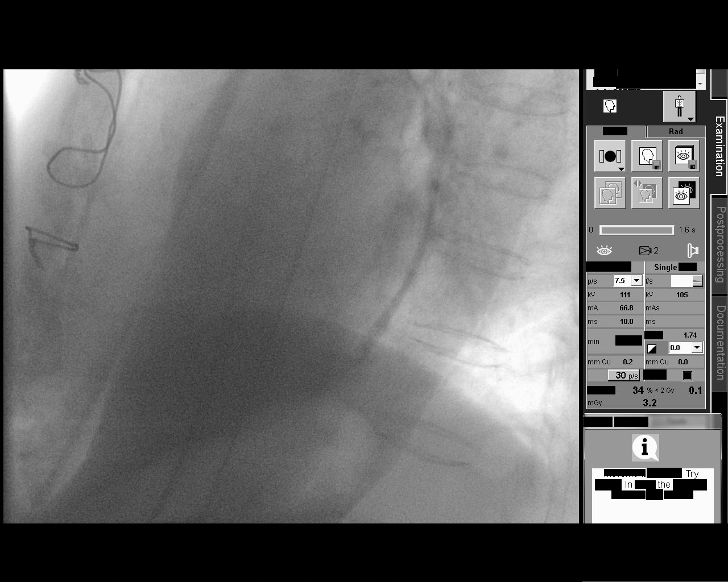

[13 of 24 positions shown; findings below may reference images not displayed]

FINDINGS: Thin liquid- within normal limits

Nectar thick liquid- within normal limits

Karenis?Vall within normal limits

Karenis?Masmaliyev with cracker- within normal limits
IMPRESSION: No evidence of aspiration.  Normal swallowing function.

Please refer to the Speech Pathologists report for complete details
and recommendations.

## 2018-06-05 ENCOUNTER — Encounter
Admission: RE | Admit: 2018-06-05 | Discharge: 2018-06-05 | Disposition: A | Discharge status: Home / Self Care | Payer: Medicare Other | Source: Ambulatory Visit | Attending: Internal Medicine | Admitting: Internal Medicine

## 2018-06-07 ENCOUNTER — Encounter: Payer: Self-pay | Admitting: Adult Health

## 2018-06-07 ENCOUNTER — Non-Acute Institutional Stay (SKILLED_NURSING_FACILITY): Payer: Medicare Other | Admitting: Adult Health

## 2018-06-07 DIAGNOSIS — G8929 Other chronic pain: Secondary | ICD-10-CM | POA: Diagnosis not present

## 2018-06-07 DIAGNOSIS — M545 Low back pain: Secondary | ICD-10-CM

## 2018-06-07 NOTE — Progress Notes (Signed)
[facility]   [facility] Room Number: [place] of Service:  SNF (31)   CODE STATUS: DNR  Allergies  Allergen Reactions  . Iodinated Diagnostic Agents Itching and Swelling    Other reaction(s): Unknown  . Nitrofurantoin Diarrhea    Other reaction(s): Unknown  . Omnipaque [Iohexol]   . Solifenacin Other (See Comments)    indigestion  . Ciprofloxacin Other (See Comments) and Rash    Colitis  . Metronidazole Itching and Rash  . Sulfa Antibiotics Itching and Rash    Chief Complaint  Patient presents with  . Acute Visit    Right hip / Leg pain    HPI:  She continues to have right leg; right hip and leg pain. She is not getting relief with her current regimen. She is able to move all extremities; she does have pain that radiates to her right leg. She states that at times her pain is less intense.   Past Medical History:  Diagnosis Date  . Adenoma of rectum   . Adrenal adenoma   . Anemia   . Anemia, unspecified   . Arthritis   . Ascending aortic aneurysm ([place])    a. s/p repair 07/2014  . Atrophic vaginitis   . Benign neoplasm of colon, unspecified   . COPD (chronic obstructive pulmonary disease) ([place])   . Coronary artery disease, non-occlusive    a. LHC 06/2014 without evidence of obstructive disease  . Depression   . Diverticulosis   . Dysphagia   . Dysrhythmia   . Essential hypertension    benign  . Frequent UTI   . GERD (gastroesophageal reflux disease)   . Hemiplegia and hemiparesis    following cerebral infarction affecting left non-dominant side  . Hypertension   . Microscopic hematuria   . Nonrheumatic aortic valve insufficiency   . Osteoporosis    unspecified  . Panic attacks   . Paroxysmal atrial fibrillation (HCC)   . Pernicious anemia   . Renal cyst    CKD STAGE 3  . Stroke ([place]) 07/23/2014   a. post-operative setting in 07/2014 leading to left UE hemiapresis and left LE weakness  . Urge incontinence     Past  Surgical History:  Procedure Laterality Date  . ABDOMINAL HYSTERECTOMY  1972  . AORTOILIAC BYPASS    . ASCENDING AORTIC ANEURYSM REPAIR  07/23/2014   07/2014  . BLEPHAROPLASTY    . CATARACT EXTRACTION  2005  . GASTROSTOMY W/ FEEDING TUBE    . INTRAMEDULLARY (IM) NAIL INTERTROCHANTERIC Left 02/26/2016   Procedure: INTRAMEDULLARY (IM) NAIL INTERTROCHANTRIC;  Surgeon: Earnestine Leys, MD;  Location: [facility] ORS;  Service: Orthopedics;  Laterality: Left;  . KYPHOPLASTY N/A 08/26/2016   Procedure: KYPHOPLASTY T12;  Surgeon: Hessie Knows, MD;  Location: [facility] ORS;  Service: Orthopedics;  Laterality: N/A;  . LEFT HEART CATH  06/2014  . PR ASCEND AORTIC GRAFT INCL VALVE SUSPENSION  07/07/2014   Procedure: ASCENDING AORTA GRAFT, WITH CARDIOPULMONARY BYPASS, WITH OR WITHOUT VALVE SUSPENSION; Surgeon: Dwaine Deter, MD; Location: MAIN OR [facility]; Service: Cardiothoracic  . PR LAP, GASTROTOMY W/O TUBE CONSTR  08/08/2014   Procedure: LAPAROSCOPY, SURGICAL; GASTOSTOMY W/O CONSTRUCTION OF GASTRIC TUBE (EG, STAMM PROCEDURE)(SEPARATE PROCED); Surgeon: Fredrik Rigger, MD; Location: MAIN OR [facility]; Service: Gastrointestinal  . PR PLACE PERCUT GASTROSTOMY TUBE  08/08/2014   Procedure: UGI ENDO; W/DIRECTED PLCMT PERQ GASTROSTOMY TUBE; Surgeon: Fredrik Rigger, MD; Location: MAIN OR [facility]; Service: Gastrointestinal    Social History  Socioeconomic History  . Marital status: Widowed    Spouse name: Not on file  . Number of children: 3  . Years of education: 81  . Highest education level: Not on file  Occupational History  . Not on file  Social Needs  . Financial resource strain: Not on file  . Food insecurity:    Worry: Not on file    Inability: Not on file  . Transportation needs:    Medical: Not on file    Non-medical: Not on file  Tobacco Use  . Smoking status: Former Smoker    Packs/day: 0.50    Years: 50.00    Pack years: 25.00    Types: Cigarettes    Last attempt to quit:  05/30/2014    Years since quitting: 4.0  . Smokeless tobacco: Never Used  Substance and Sexual Activity  . Alcohol use: No    Comment: occasionally  . Drug use: No  . Sexual activity: Not on file  Lifestyle  . Physical activity:    Days per week: Not on file    Minutes per session: Not on file  . Stress: Not on file  Relationships  . Social connections:    Talks on phone: Not on file    Gets together: Not on file    Attends religious service: Not on file    Active member of club or organization: Not on file    Attends meetings of clubs or organizations: Not on file    Relationship status: Not on file  . Intimate partner violence:    Fear of current or ex partner: Not on file    Emotionally abused: Not on file    Physically abused: Not on file    Forced sexual activity: Not on file  Other Topics Concern  . Not on file  Social History Narrative   Admitted to [facility] 03/01/2016   Widowed   4 children   Former Smoker   Occasionally drinks   DNR   Family History  Problem Relation Age of Onset  . Lung cancer Son   . CAD Father   . Heart attack Father   . CAD Brother   . Heart attack Brother   . Stroke Mother       VITAL SIGNS BP (!) 148/53   Pulse 74   Temp 98.3 F (36.8 C)   Resp 20   Ht 5\' 1"  (1.549 m)   Wt 142 lb 4.8 oz (64.5 kg)   SpO2 97%   BMI 26.89 kg/m   Outpatient Encounter Medications as of 06/07/2018  Medication Sig  . acetaminophen (TYLENOL) 325 MG tablet Take 650 mg by mouth 4 (four) times daily.   Marland Kitchen albuterol (PROVENTIL HFA;VENTOLIN HFA) 108 (90 Base) MCG/ACT inhaler Inhale 2 puffs into the lungs every 4 (four) hours as needed for wheezing or shortness of breath.  Marland Kitchen alum & mag hydroxide-simeth (MAALOX PLUS) 400-400-40 MG/5ML suspension Take 30 mLs by mouth every 4 (four) hours as needed.  Marland Kitchen aspirin 81 MG tablet Take 81 mg by mouth daily.   Marland Kitchen BREO ELLIPTA 100-25 MCG/INH AEPB Inhale 1 puff into the lungs daily. Rinse mouth well after  use  . carboxymethylcellulose (REFRESH TEARS) 0.5 % SOLN Place 2 drops into both eyes See admin instructions. 1 to 4 times daily prn  . diclofenac sodium (VOLTAREN) 1 % GEL Apply 4 g topically 4 (four) times daily to lumbar spine for pain  Apply 4 g topically t bilateral  hips and knees every 8 hours as needed for pain  . diltiazem (CARDIZEM) 60 MG tablet Take 60 mg by mouth daily.  Marland Kitchen docusate sodium (COLACE) 100 MG capsule Take 1 capsule (100 mg total) by mouth 2 (two) times daily.  Marland Kitchen gabapentin (NEURONTIN) 100 MG capsule Take 200 mg by mouth 2 (two) times daily. 2 caps  . ipratropium-albuterol (DUONEB) 0.5-2.5 (3) MG/3ML SOLN Take 3 mLs by nebulization 3 (three) times daily.  . Liniments (SALONPAS PAIN RELIEF PATCH EX) Lidocain 4% - Apply one patch to Right Flank / sore ribs daily.  Remove after 12 hours  . magnesium hydroxide (MILK OF MAGNESIA) 400 MG/5ML suspension Take 30 mLs by mouth daily as needed for mild constipation.  . methocarbamol (ROBAXIN) 500 MG tablet Take 250 mg by mouth 2 (two) times daily. 1/2 tablet  . montelukast (SINGULAIR) 10 MG tablet Take 10 mg by mouth at bedtime.  Marland Kitchen oxycodone (OXY-IR) 5 MG capsule Take 1 capsule (5 mg total) by mouth every 4 (four) hours as needed. For moderate or severe pain  . OXYGEN Inhale 2 L into the lungs continuous as needed.  . pantoprazole (PROTONIX) 40 MG tablet Take 40 mg by mouth daily.  Vladimir Faster Glycol-Propyl Glycol (SYSTANE) 0.4-0.3 % GEL ophthalmic gel Place 1 application into both eyes at bedtime. (2 drops ) for dry eyes (Ointments are on Backorder)  . polyethylene glycol (MIRALAX) packet Take 17 g by mouth 2 (two) times daily.  . potassium chloride (K-DUR,KLOR-CON) 10 MEQ tablet Take 10 mEq by mouth daily.   . predniSONE (DELTASONE) 5 MG tablet Take 5 mg by mouth daily with breakfast.   . senna-docusate (SENNA-PLUS) 8.6-50 MG tablet Take 2 tablets by mouth 2 (two) times daily as needed.   . sertraline (ZOLOFT) 100 MG tablet Take 100  mg by mouth daily. 8 pm  . sertraline (ZOLOFT) 50 MG tablet Take 50 mg by mouth at bedtime. 8 pm  . Skin Protectants, Misc. (MINERIN) CREA Apply liberal amount daily to arms and legs after the bath and PRN for skin integrity  . sodium phosphate Pediatric (FLEET) 3.5-9.5 GM/59ML enema Place 1 enema rectally once as needed for severe constipation. Take once every 3 days if constipation is not relieved by Milk of magnesia or Bisacodyl Suppository  . torsemide (DEMADEX) 10 MG tablet Take 10 mg by mouth daily.   Marland Kitchen UNABLE TO FIND Diet Type: NAS   No facility-administered encounter medications on file as of 06/07/2018.      SIGNIFICANT DIAGNOSTIC EXAMS  LABS REVIEWED PREVIOUS   02-09-18: wbc 6.5 hgb 11.0; hct 33.1; mcv 94.0 plt 165 10 -31-19: wbc 6.6; hgb 10.3; hct 32.5; mcv 96.4; plt  178; glucose 88; bun 30; creat 1.27; k+ 4.9; na++ 141; ca 8.5  NO NEW LABS.    Review of Systems  Constitutional: Negative for malaise/fatigue.  Respiratory: Negative for cough and shortness of breath.   Cardiovascular: Negative for chest pain, palpitations and leg swelling.  Gastrointestinal: Negative for abdominal pain, constipation and heartburn.  Musculoskeletal: Positive for back pain and joint pain. Negative for myalgias.  Skin: Negative.   Neurological: Negative for dizziness.  Psychiatric/Behavioral: The patient is not nervous/anxious.      Physical Exam Constitutional:      General: She is not in acute distress.    Appearance: She is well-developed. She is not diaphoretic.  Neck:     Musculoskeletal: Neck supple.     Thyroid: No thyromegaly.  Cardiovascular:  Rate and Rhythm: Normal rate and regular rhythm.     Pulses: Normal pulses.     Heart sounds: Normal heart sounds.  Pulmonary:     Effort: Pulmonary effort is normal. No respiratory distress.     Breath sounds: Normal breath sounds.  Abdominal:     General: Bowel sounds are normal. There is no distension.     Palpations: Abdomen  is soft.     Tenderness: There is no abdominal tenderness.  Musculoskeletal:     Right lower leg: No edema.     Left lower leg: No edema.     Comments: Unable to move left upper extremity does have splint Left lower extremity is weak Is able to move right extremities.    Back is tender to palpation   Lymphadenopathy:     Cervical: No cervical adenopathy.  Skin:    General: Skin is warm and dry.  Neurological:     Mental Status: She is alert and oriented to person, place, and time.      ASSESSMENT/ PLAN:  TODAY:   1. Chronic bilateral low back pain without sciatica: is worse: will increase neurontin to 300 mg twice daily will have PT evaluate and treat as indicated for back pain management. Will get x-ray of L/S spine and right hip.       MD is aware of resident's narcotic use and is in agreement with current plan of care. We will attempt to wean resident as apropriate   Ok Edwards NP [facility]  Contact [phone] Monday through Friday 8am- 5pm  After hours call [phone]

## 2018-06-09 ENCOUNTER — Non-Acute Institutional Stay (SKILLED_NURSING_FACILITY): Payer: Medicare Other | Admitting: Adult Health

## 2018-06-09 ENCOUNTER — Encounter: Payer: Self-pay | Admitting: Adult Health

## 2018-06-09 ENCOUNTER — Other Ambulatory Visit: Payer: Self-pay | Admitting: Adult Health

## 2018-06-09 DIAGNOSIS — T402X5A Adverse effect of other opioids, initial encounter: Secondary | ICD-10-CM | POA: Diagnosis not present

## 2018-06-09 DIAGNOSIS — S32020A Wedge compression fracture of second lumbar vertebra, initial encounter for closed fracture: Secondary | ICD-10-CM

## 2018-06-09 DIAGNOSIS — K5903 Drug induced constipation: Secondary | ICD-10-CM

## 2018-06-09 MED ORDER — OXYCODONE HCL 5 MG PO CAPS: 5.0000 mg | ORAL_CAPSULE | ORAL | 0 refills | Status: DC

## 2018-06-09 NOTE — Progress Notes (Signed)
[facility]:   [facility] Room Number: [place] of Service:  SNF (31)   CODE STATUS: DNR  Allergies  Allergen Reactions  . Iodinated Diagnostic Agents Itching and Swelling    Other reaction(s): Unknown  . Nitrofurantoin Diarrhea    Other reaction(s): Unknown  . Omnipaque [Iohexol]   . Solifenacin Other (See Comments)    indigestion  . Ciprofloxacin Other (See Comments) and Rash    Colitis  . Metronidazole Itching and Rash  . Sulfa Antibiotics Itching and Rash    Chief Complaint  Patient presents with  . Acute Visit    Follow up xray results    HPI:  She has had a lumbar sacral and right hip x-ray done. The x-ray demonstrate a L2 compression fracture age indeterminate. She continues to have uncontrolled back pain; muscle spasms. She is having constipation. I have spoken with her family; will need to see orthopedics asap.    Past Medical History:  Diagnosis Date  . Adenoma of rectum   . Adrenal adenoma   . Anemia   . Anemia, unspecified   . Arthritis   . Ascending aortic aneurysm ([place])    a. s/p repair 07/2014  . Atrophic vaginitis   . Benign neoplasm of colon, unspecified   . COPD (chronic obstructive pulmonary disease) ([place])   . Coronary artery disease, non-occlusive    a. LHC 06/2014 without evidence of obstructive disease  . Depression   . Diverticulosis   . Dysphagia   . Dysrhythmia   . Essential hypertension    benign  . Frequent UTI   . GERD (gastroesophageal reflux disease)   . Hemiplegia and hemiparesis    following cerebral infarction affecting left non-dominant side  . Hypertension   . Microscopic hematuria   . Nonrheumatic aortic valve insufficiency   . Osteoporosis    unspecified  . Panic attacks   . Paroxysmal atrial fibrillation (HCC)   . Pernicious anemia   . Renal cyst    CKD STAGE 3  . Stroke ([place]) 07/23/2014   a. post-operative setting in 07/2014 leading to left UE hemiapresis and left LE weakness  . Urge  incontinence     Past Surgical History:  Procedure Laterality Date  . ABDOMINAL HYSTERECTOMY  1972  . AORTOILIAC BYPASS    . ASCENDING AORTIC ANEURYSM REPAIR  07/23/2014   07/2014  . BLEPHAROPLASTY    . CATARACT EXTRACTION  2005  . GASTROSTOMY W/ FEEDING TUBE    . INTRAMEDULLARY (IM) NAIL INTERTROCHANTERIC Left 02/26/2016   Procedure: INTRAMEDULLARY (IM) NAIL INTERTROCHANTRIC;  Surgeon: Earnestine Leys, MD;  Location: [facility] ORS;  Service: Orthopedics;  Laterality: Left;  . KYPHOPLASTY N/A 08/26/2016   Procedure: KYPHOPLASTY T12;  Surgeon: Hessie Knows, MD;  Location: [facility] ORS;  Service: Orthopedics;  Laterality: N/A;  . LEFT HEART CATH  06/2014  . PR ASCEND AORTIC GRAFT INCL VALVE SUSPENSION  07/07/2014   Procedure: ASCENDING AORTA GRAFT, WITH CARDIOPULMONARY BYPASS, WITH OR WITHOUT VALVE SUSPENSION; Surgeon: Dwaine Deter, MD; Location: MAIN OR [facility]; Service: Cardiothoracic  . PR LAP, GASTROTOMY W/O TUBE CONSTR  08/08/2014   Procedure: LAPAROSCOPY, SURGICAL; GASTOSTOMY W/O CONSTRUCTION OF GASTRIC TUBE (EG, STAMM PROCEDURE)(SEPARATE PROCED); Surgeon: Fredrik Rigger, MD; Location: [facility]; Service: Gastrointestinal  . PR PLACE PERCUT GASTROSTOMY TUBE  08/08/2014   Procedure: UGI ENDO; W/DIRECTED PLCMT PERQ GASTROSTOMY TUBE; Surgeon: Fredrik Rigger, MD; Location: MAIN OR [facility]; Service: Gastrointestinal    Social History   Socioeconomic History  .  Marital status: Widowed    Spouse name: Not on file  . Number of children: 3  . Years of education: 53  . Highest education level: Not on file  Occupational History  . Not on file  Social Needs  . Financial resource strain: Not on file  . Food insecurity:    Worry: Not on file    Inability: Not on file  . Transportation needs:    Medical: Not on file    Non-medical: Not on file  Tobacco Use  . Smoking status: Former Smoker    Packs/day: 0.50    Years: 50.00    Pack years: 25.00    Types: Cigarettes    Last  attempt to quit: 05/30/2014    Years since quitting: 4.0  . Smokeless tobacco: Never Used  Substance and Sexual Activity  . Alcohol use: No    Comment: occasionally  . Drug use: No  . Sexual activity: Not on file  Lifestyle  . Physical activity:    Days per week: Not on file    Minutes per session: Not on file  . Stress: Not on file  Relationships  . Social connections:    Talks on phone: Not on file    Gets together: Not on file    Attends religious service: Not on file    Active member of club or organization: Not on file    Attends meetings of clubs or organizations: Not on file    Relationship status: Not on file  . Intimate partner violence:    Fear of current or ex partner: Not on file    Emotionally abused: Not on file    Physically abused: Not on file    Forced sexual activity: Not on file  Other Topics Concern  . Not on file  Social History Narrative   Admitted to [facility] 03/01/2016   Widowed   4 children   Former Smoker   Occasionally drinks   DNR   Family History  Problem Relation Age of Onset  . Lung cancer Son   . CAD Father   . Heart attack Father   . CAD Brother   . Heart attack Brother   . Stroke Mother       VITAL SIGNS BP (!) 115/42   Pulse 63   Temp 98.2 F (36.8 C)   Resp 18   Ht 5\' 1"  (1.549 m)   Wt 139 lb 11.2 oz (63.4 kg)   SpO2 97%   BMI 26.40 kg/m   Outpatient Encounter Medications as of 06/09/2018  Medication Sig  . acetaminophen (TYLENOL) 325 MG tablet Take 650 mg by mouth 4 (four) times daily.   Marland Kitchen albuterol (PROVENTIL HFA;VENTOLIN HFA) 108 (90 Base) MCG/ACT inhaler Inhale 2 puffs into the lungs every 4 (four) hours as needed for wheezing or shortness of breath.  Marland Kitchen alum & mag hydroxide-simeth (MAALOX PLUS) 400-400-40 MG/5ML suspension Take 30 mLs by mouth every 4 (four) hours as needed.  Marland Kitchen aspirin 81 MG tablet Take 81 mg by mouth daily.   Marland Kitchen BREO ELLIPTA 100-25 MCG/INH AEPB Inhale 1 puff into the lungs daily. Rinse  mouth well after use  . carboxymethylcellulose (REFRESH TEARS) 0.5 % SOLN Place 2 drops into both eyes See admin instructions. 1 to 4 times daily prn  . diclofenac sodium (VOLTAREN) 1 % GEL Apply 4 g topically 4 (four) times daily to lumbar spine for pain  Apply 4 g topically t bilateral hips and knees every  8 hours as needed for pain  . diltiazem (CARDIZEM) 60 MG tablet Take 60 mg by mouth daily.  Marland Kitchen docusate sodium (COLACE) 100 MG capsule Take 1 capsule (100 mg total) by mouth 2 (two) times daily.  Marland Kitchen gabapentin (NEURONTIN) 300 MG capsule Take 300 mg by mouth 2 (two) times daily.   Marland Kitchen ipratropium-albuterol (DUONEB) 0.5-2.5 (3) MG/3ML SOLN Take 3 mLs by nebulization 3 (three) times daily.  . Liniments (SALONPAS PAIN RELIEF PATCH EX) Lidocain 4% - Apply one patch to Right Flank / sore ribs daily.  Remove after 12 hours  . magnesium hydroxide (MILK OF MAGNESIA) 400 MG/5ML suspension Take 30 mLs by mouth daily as needed for mild constipation.  . methocarbamol (ROBAXIN) 500 MG tablet Take 500 mg by mouth every 8 (eight) hours. 1/2 tablet  . montelukast (SINGULAIR) 10 MG tablet Take 10 mg by mouth at bedtime.  Marland Kitchen oxycodone (OXY-IR) 5 MG capsule Take 1 capsule (5 mg total) by mouth every 4 (four) hours for 6 days. For moderate or severe pain  . OXYGEN Inhale 2 L into the lungs continuous as needed.  . pantoprazole (PROTONIX) 40 MG tablet Take 40 mg by mouth daily.  Vladimir Faster Glycol-Propyl Glycol (SYSTANE) 0.4-0.3 % GEL ophthalmic gel Place 1 application into both eyes at bedtime. (2 drops ) for dry eyes (Ointments are on Backorder)  . polyethylene glycol (MIRALAX) packet Take 17 g by mouth 2 (two) times daily.  . potassium chloride (K-DUR,KLOR-CON) 10 MEQ tablet Take 10 mEq by mouth daily.   . predniSONE (DELTASONE) 5 MG tablet Take 5 mg by mouth daily with breakfast.   . senna-docusate (SENNA-PLUS) 8.6-50 MG tablet Take 2 tablets by mouth 2 (two) times daily as needed.   . sertraline (ZOLOFT) 100 MG  tablet Take 100 mg by mouth daily. 8 pm  . sertraline (ZOLOFT) 50 MG tablet Take 50 mg by mouth at bedtime. 8 pm  . Skin Protectants, Misc. (MINERIN) CREA Apply liberal amount daily to arms and legs after the bath and PRN for skin integrity  . sodium phosphate Pediatric (FLEET) 3.5-9.5 GM/59ML enema Place 1 enema rectally once as needed for severe constipation. Take once every 3 days if constipation is not relieved by Milk of magnesia or Bisacodyl Suppository  . torsemide (DEMADEX) 10 MG tablet Take 10 mg by mouth daily.   Marland Kitchen UNABLE TO FIND Diet Type: NAS   No facility-administered encounter medications on file as of 06/09/2018.      SIGNIFICANT DIAGNOSTIC EXAMS  TODAY:   06-08-18: sacral/coccxy no fracture  06-08-18: right hip x-ray: no fracture moderate degenerative changes  06-08-18: lumbar spine x-ray: compression fracture L2 verterbral body indeterminate age status post vertebroplasty T12 and L3   LABS REVIEWED PREVIOUS   02-09-18: wbc 6.5 hgb 11.0; hct 33.1; mcv 94.0 plt 165 10 -31-19: wbc 6.6; hgb 10.3; hct 32.5; mcv 96.4; plt  178; glucose 88; bun 30; creat 1.27; k+ 4.9; na++ 141; ca 8.5  NO NEW LABS.    Review of Systems  Constitutional: Negative for malaise/fatigue.  Respiratory: Negative for cough and shortness of breath.   Cardiovascular: Negative for chest pain, palpitations and leg swelling.  Gastrointestinal: Negative for abdominal pain, constipation and heartburn.  Musculoskeletal: Positive for back pain, joint pain and myalgias.  Skin: Negative.   Neurological: Negative for dizziness.  Psychiatric/Behavioral: The patient is not nervous/anxious.     Physical Exam Constitutional:      General: She is not in acute distress.  Appearance: She is well-developed. She is not diaphoretic.  Neck:     Musculoskeletal: Neck supple.     Thyroid: No thyromegaly.  Cardiovascular:     Rate and Rhythm: Normal rate and regular rhythm.     Pulses: Normal pulses.     Heart  sounds: Normal heart sounds.  Pulmonary:     Effort: Pulmonary effort is normal. No respiratory distress.     Breath sounds: Normal breath sounds.  Abdominal:     General: Bowel sounds are normal. There is no distension.     Palpations: Abdomen is soft.     Tenderness: There is no abdominal tenderness.  Musculoskeletal:     Right lower leg: No edema.     Left lower leg: No edema.     Comments: Unable to move left upper extremity does have splint Left lower extremity is weak Is able to move right extremities.    Back is tender to palpation    Lymphadenopathy:     Cervical: No cervical adenopathy.  Skin:    General: Skin is warm and dry.  Neurological:     Mental Status: She is alert and oriented to person, place, and time.  Psychiatric:        Mood and Affect: Mood normal.      ASSESSMENT/ PLAN:  TODAY:   1. Chronic bilateral low back pain without sciatica has L2 compression fracture : is worse: will continue neurontin  300 mg twice daily will begin robaxin 500 mg every 8 hours; will begin oxycodone 5 mg every 4 hours through 06-15-18;   2. Constipation due to opioid therapy: is worse: will continue miralax twice daily and will being senna s 2 tabs twice daily         MD is aware of resident's narcotic use and is in agreement with current plan of care. We will attempt to wean resident as apropriate   Ok Edwards NP [facility] Adult [facility]  Contact [phone] Monday through Friday 8am- 5pm  After hours call [phone]

## 2018-06-10 ENCOUNTER — Other Ambulatory Visit: Payer: Self-pay | Admitting: Surgery

## 2018-06-10 ENCOUNTER — Other Ambulatory Visit (HOSPITAL_COMMUNITY): Payer: Self-pay | Admitting: Surgery

## 2018-06-10 ENCOUNTER — Ambulatory Visit
Admission: RE | Admit: 2018-06-10 | Discharge: 2018-06-10 | Disposition: A | Discharge status: Home / Self Care | Payer: Medicare Other | Source: Ambulatory Visit | Attending: Surgery | Admitting: Surgery

## 2018-06-10 DIAGNOSIS — S32020A Wedge compression fracture of second lumbar vertebra, initial encounter for closed fracture: Secondary | ICD-10-CM | POA: Diagnosis present

## 2018-06-11 ENCOUNTER — Other Ambulatory Visit
Admission: RE | Admit: 2018-06-11 | Discharge: 2018-06-11 | Disposition: A | Discharge status: Home / Self Care | Payer: Medicare Other | Source: Skilled Nursing Facility | Attending: Internal Medicine | Admitting: Internal Medicine

## 2018-06-11 DIAGNOSIS — N183 Chronic kidney disease, stage 3 (moderate): Secondary | ICD-10-CM | POA: Insufficient documentation

## 2018-06-11 LAB — COMPREHENSIVE METABOLIC PANEL
ALBUMIN: 3.7 g/dL (ref 3.5–5.0)
ALT: 12 U/L (ref 0–44)
AST: 18 U/L (ref 15–41)
Alkaline Phosphatase: 70 U/L (ref 38–126)
Anion gap: 8 (ref 5–15)
BUN: 30 mg/dL — AB (ref 8–23)
CO2: 26 mmol/L (ref 22–32)
CREATININE: 1.19 mg/dL — AB (ref 0.44–1.00)
Calcium: 9 mg/dL (ref 8.9–10.3)
Chloride: 107 mmol/L (ref 98–111)
GFR calc Af Amer: 47 mL/min — ABNORMAL LOW (ref >60)
GFR, EST NON AFRICAN AMERICAN: 40 mL/min — AB (ref >60)
GLUCOSE: 105 mg/dL — AB (ref 70–99)
POTASSIUM: 4.4 mmol/L (ref 3.5–5.1)
Sodium: 141 mmol/L (ref 135–145)
TOTAL PROTEIN: 5.7 g/dL — AB (ref 6.5–8.1)
Total Bilirubin: 0.7 mg/dL (ref 0.3–1.2)

## 2018-06-11 LAB — CBC WITH DIFFERENTIAL/PLATELET
ABS IMMATURE GRANULOCYTES: 0.04 10*3/uL (ref 0.00–0.07)
BASOS ABS: 0 10*3/uL (ref 0.0–0.1)
Basophils Relative: 1 %
Eosinophils Absolute: 0.1 10*3/uL (ref 0.0–0.5)
Eosinophils Relative: 2 %
HEMATOCRIT: 34.3 % — AB (ref 36.0–46.0)
Hemoglobin: 11 g/dL — ABNORMAL LOW (ref 12.0–15.0)
IMMATURE GRANULOCYTES: 1 %
LYMPHS ABS: 1.2 10*3/uL (ref 0.7–4.0)
LYMPHS PCT: 27 %
MCH: 29.3 pg (ref 26.0–34.0)
MCHC: 32.1 g/dL (ref 30.0–36.0)
MCV: 91.2 fL (ref 80.0–100.0)
MONOS PCT: 7 %
Monocytes Absolute: 0.3 10*3/uL (ref 0.1–1.0)
NEUTROS ABS: 2.8 10*3/uL (ref 1.7–7.7)
NEUTROS PCT: 62 %
NRBC: 0 % (ref 0.0–0.2)
Platelets: 198 10*3/uL (ref 150–400)
RBC: 3.76 MIL/uL — ABNORMAL LOW (ref 3.87–5.11)
RDW: 13.2 % (ref 11.5–15.5)
WBC: 4.4 10*3/uL (ref 4.0–10.5)

## 2018-06-13 DIAGNOSIS — K5903 Drug induced constipation: Secondary | ICD-10-CM | POA: Insufficient documentation

## 2018-06-13 DIAGNOSIS — T402X5A Adverse effect of other opioids, initial encounter: Secondary | ICD-10-CM

## 2018-06-14 ENCOUNTER — Non-Acute Institutional Stay (SKILLED_NURSING_FACILITY): Payer: Medicare Other | Admitting: Adult Health

## 2018-06-14 ENCOUNTER — Encounter: Payer: Self-pay | Admitting: Adult Health

## 2018-06-14 ENCOUNTER — Other Ambulatory Visit: Payer: Self-pay | Admitting: Adult Health

## 2018-06-14 DIAGNOSIS — M545 Low back pain, unspecified: Secondary | ICD-10-CM

## 2018-06-14 DIAGNOSIS — S32020S Wedge compression fracture of second lumbar vertebra, sequela: Secondary | ICD-10-CM | POA: Diagnosis not present

## 2018-06-14 DIAGNOSIS — G8929 Other chronic pain: Secondary | ICD-10-CM | POA: Diagnosis not present

## 2018-06-14 MED ORDER — OXYCODONE HCL 5 MG PO CAPS: 5.0000 mg | ORAL_CAPSULE | ORAL | 0 refills | Status: AC | PRN

## 2018-06-14 NOTE — Progress Notes (Signed)
Location:   [facility] Room Number: [place] of Service:  SNF (31)   CODE STATUS: DNR  Allergies  Allergen Reactions  . Iodinated Diagnostic Agents Itching and Swelling    Other reaction(s): Unknown  . Nitrofurantoin Diarrhea    Other reaction(s): Unknown  . Omnipaque [Iohexol]   . Solifenacin Other (See Comments)    indigestion  . Ciprofloxacin Other (See Comments) and Rash    Colitis  . Metronidazole Itching and Rash  . Sulfa Antibiotics Itching and Rash    Chief Complaint  Patient presents with  . Acute Visit    Increased confusion and hallucinations    HPI:  She is more confused; is presently on routine oxycodone and robaxin for pain management form her compression fracture. Her po intake is poor today. She is lethargic today; there are no reports of uncontrolled pain; no fevers.   Past Medical History:  Diagnosis Date  . Adenoma of rectum   . Adrenal adenoma   . Anemia   . Anemia, unspecified   . Arthritis   . Ascending aortic aneurysm ([place])    a. s/p repair 07/2014  . Atrophic vaginitis   . Benign neoplasm of colon, unspecified   . COPD (chronic obstructive pulmonary disease) ([place])   . Coronary artery disease, non-occlusive    a. LHC 06/2014 without evidence of obstructive disease  . Depression   . Diverticulosis   . Dysphagia   . Dysrhythmia   . Essential hypertension    benign  . Frequent UTI   . GERD (gastroesophageal reflux disease)   . Hemiplegia and hemiparesis    following cerebral infarction affecting left non-dominant side  . Hypertension   . Microscopic hematuria   . Nonrheumatic aortic valve insufficiency   . Osteoporosis    unspecified  . Panic attacks   . Paroxysmal atrial fibrillation (HCC)   . Pernicious anemia   . Renal cyst    CKD STAGE 3  . Stroke ([place]) 07/23/2014   a. post-operative setting in 07/2014 leading to left UE hemiapresis and left LE weakness  . Urge incontinence     Past Surgical  History:  Procedure Laterality Date  . ABDOMINAL HYSTERECTOMY  1972  . AORTOILIAC BYPASS    . ASCENDING AORTIC ANEURYSM REPAIR  07/23/2014   07/2014  . BLEPHAROPLASTY    . CATARACT EXTRACTION  2005  . GASTROSTOMY W/ FEEDING TUBE    . INTRAMEDULLARY (IM) NAIL INTERTROCHANTERIC Left 02/26/2016   Procedure: INTRAMEDULLARY (IM) NAIL INTERTROCHANTRIC;  Surgeon: Earnestine Leys, MD;  Location: [facility] ORS;  Service: Orthopedics;  Laterality: Left;  . KYPHOPLASTY N/A 08/26/2016   Procedure: KYPHOPLASTY T12;  Surgeon: Hessie Knows, MD;  Location: [facility] ORS;  Service: Orthopedics;  Laterality: N/A;  . LEFT HEART CATH  06/2014  . PR ASCEND AORTIC GRAFT INCL VALVE SUSPENSION  07/07/2014   Procedure: ASCENDING AORTA GRAFT, WITH CARDIOPULMONARY BYPASS, WITH OR WITHOUT VALVE SUSPENSION; Surgeon: Dwaine Deter, MD; Location: MAIN OR [facility]; Service: Cardiothoracic  . PR LAP, GASTROTOMY W/O TUBE CONSTR  08/08/2014   Procedure: LAPAROSCOPY, SURGICAL; GASTOSTOMY W/O CONSTRUCTION OF GASTRIC TUBE (EG, STAMM PROCEDURE)(SEPARATE PROCED); Surgeon: Fredrik Rigger, MD; Location[facility]; Service: Gastrointestinal  . PR PLACE PERCUT GASTROSTOMY TUBE  08/08/2014   Procedure: UGI ENDO; W/DIRECTED PLCMT PERQ GASTROSTOMY TUBE; Surgeon: Fredrik Rigger, MD; Location: MAIN OR [facility]; Service: Gastrointestinal    Social History   Socioeconomic History  . Marital status: Widowed    Spouse  name: Not on file  . Number of children: 3  . Years of education: 55  . Highest education level: Not on file  Occupational History  . Not on file  Social Needs  . Financial resource strain: Not on file  . Food insecurity:    Worry: Not on file    Inability: Not on file  . Transportation needs:    Medical: Not on file    Non-medical: Not on file  Tobacco Use  . Smoking status: Former Smoker    Packs/day: 0.50    Years: 50.00    Pack years: 25.00    Types: Cigarettes    Last attempt to quit: 05/30/2014     Years since quitting: 4.0  . Smokeless tobacco: Never Used  Substance and Sexual Activity  . Alcohol use: No    Comment: occasionally  . Drug use: No  . Sexual activity: Not on file  Lifestyle  . Physical activity:    Days per week: Not on file    Minutes per session: Not on file  . Stress: Not on file  Relationships  . Social connections:    Talks on phone: Not on file    Gets together: Not on file    Attends religious service: Not on file    Active member of club or organization: Not on file    Attends meetings of clubs or organizations: Not on file    Relationship status: Not on file  . Intimate partner violence:    Fear of current or ex partner: Not on file    Emotionally abused: Not on file    Physically abused: Not on file    Forced sexual activity: Not on file  Other Topics Concern  . Not on file  Social History Narrative   Admitted to [place] 03/01/2016   Widowed   4 children   Former Smoker   Occasionally drinks   DNR   Family History  Problem Relation Age of Onset  . Lung cancer Son   . CAD Father   . Heart attack Father   . CAD Brother   . Heart attack Brother   . Stroke Mother       VITAL SIGNS BP (!) 115/42   Pulse 63   Temp 98.2 F (36.8 C)   Resp 18   Ht 5\' 1"  (1.549 m)   Wt 138 lb (62.6 kg)   SpO2 97%   BMI 26.07 kg/m   Outpatient Encounter Medications as of 06/14/2018  Medication Sig  . acetaminophen (TYLENOL) 325 MG tablet Take 650 mg by mouth 4 (four) times daily.   Marland Kitchen albuterol (PROVENTIL HFA;VENTOLIN HFA) 108 (90 Base) MCG/ACT inhaler Inhale 2 puffs into the lungs every 4 (four) hours as needed for wheezing or shortness of breath.  Marland Kitchen alum & mag hydroxide-simeth (MAALOX PLUS) 400-400-40 MG/5ML suspension Take 30 mLs by mouth every 4 (four) hours as needed.  Marland Kitchen aspirin 81 MG tablet Take 81 mg by mouth daily.   Marland Kitchen BREO ELLIPTA 100-25 MCG/INH AEPB Inhale 1 puff into the lungs daily. Rinse mouth well after use  .  carboxymethylcellulose (REFRESH TEARS) 0.5 % SOLN Place 2 drops into both eyes See admin instructions. 1 to 4 times daily prn  . diclofenac sodium (VOLTAREN) 1 % GEL Apply 4 g topically 4 (four) times daily to lumbar spine for pain  Apply 4 g topically t bilateral hips and knees every 8 hours as needed for pain  . diltiazem (  CARDIZEM) 60 MG tablet Take 60 mg by mouth daily.  Marland Kitchen docusate sodium (COLACE) 100 MG capsule Take 1 capsule (100 mg total) by mouth 2 (two) times daily.  Marland Kitchen gabapentin (NEURONTIN) 300 MG capsule Take 300 mg by mouth 2 (two) times daily.   Marland Kitchen ipratropium-albuterol (DUONEB) 0.5-2.5 (3) MG/3ML SOLN Take 3 mLs by nebulization 3 (three) times daily.  . Liniments (SALONPAS PAIN RELIEF PATCH EX) Lidocain 4% - Apply one patch to Right Flank / sore ribs daily.  Remove after 12 hours  . magnesium hydroxide (MILK OF MAGNESIA) 400 MG/5ML suspension Take 30 mLs by mouth daily as needed for mild constipation.  . methocarbamol (ROBAXIN) 500 MG tablet Take 500 mg by mouth every 8 (eight) hours. 1/2 tablet  . montelukast (SINGULAIR) 10 MG tablet Take 10 mg by mouth at bedtime.  Marland Kitchen oxycodone (OXY-IR) 5 MG capsule Take 1 capsule (5 mg total) by mouth every 4 (four) hours for 6 days. For moderate or severe pain  . OXYGEN Inhale 2 L into the lungs continuous as needed.  . pantoprazole (PROTONIX) 40 MG tablet Take 40 mg by mouth daily.  Vladimir Faster Glycol-Propyl Glycol (SYSTANE) 0.4-0.3 % GEL ophthalmic gel Place 1 application into both eyes at bedtime. (2 drops ) for dry eyes (Ointments are on Backorder)  . polyethylene glycol (MIRALAX) packet Take 17 g by mouth 2 (two) times daily.  . potassium chloride (K-DUR,KLOR-CON) 10 MEQ tablet Take 10 mEq by mouth daily.   . predniSONE (DELTASONE) 5 MG tablet Take 5 mg by mouth daily with breakfast.   . senna-docusate (SENNA-PLUS) 8.6-50 MG tablet Take 2 tablets by mouth 2 (two) times daily as needed.   . sertraline (ZOLOFT) 100 MG tablet Take 100 mg by  mouth daily. 8 pm  . sertraline (ZOLOFT) 50 MG tablet Take 50 mg by mouth at bedtime. 8 pm  . Skin Protectants, Misc. (MINERIN) CREA Apply liberal amount daily to arms and legs after the bath and PRN for skin integrity  . sodium phosphate Pediatric (FLEET) 3.5-9.5 GM/59ML enema Place 1 enema rectally once as needed for severe constipation. Take once every 3 days if constipation is not relieved by Milk of magnesia or Bisacodyl Suppository  . torsemide (DEMADEX) 10 MG tablet Take 10 mg by mouth daily.   Marland Kitchen UNABLE TO FIND Diet Type: NAS   No facility-administered encounter medications on file as of 06/14/2018.      SIGNIFICANT DIAGNOSTIC EXAMS  PREVIOUS :   06-08-18: sacral/coccxy no fracture  06-08-18: right hip x-ray: no fracture moderate degenerative changes  06-08-18: lumbar spine x-ray: compression fracture L2 verterbral body indeterminate age status post vertebroplasty T12 and L3  TODAY:   06-10-18: lumbar spine MRI:  1. New benign-appearing a slight compression fracture of the superior endplate of L5 without neural impingement. 2. Old compression fractures at T11, T12, L2, and L3. 3. No neural impingement in the lumbar spine.    LABS REVIEWED PREVIOUS   02-09-18: wbc 6.5 hgb 11.0; hct 33.1; mcv 94.0 plt 165 10 -31-19: wbc 6.6; hgb 10.3; hct 32.5; mcv 96.4; plt  178; glucose 88; bun 30; creat 1.27; k+ 4.9; na++ 141; ca 8.5  TODAY:   06-11-18: wbc 4.4 ;hgb 11.0; hct 34.3; mcv 912; plt 198; glucose 105; bun 30; creat 1.19; k+ 4.4; na++ 131; ca 9.0 liver normal albumin 3.7    Review of Systems  Reason unable to perform ROS: is lethargic      Physical Exam Constitutional:  General: She is not in acute distress.    Appearance: She is well-developed. She is not diaphoretic.  Neck:     Thyroid: No thyromegaly.  Cardiovascular:     Rate and Rhythm: Normal rate and regular rhythm.     Pulses: Normal pulses.     Heart sounds: Normal heart sounds.  Pulmonary:     Effort:  Pulmonary effort is normal. No respiratory distress.     Breath sounds: Normal breath sounds.  Abdominal:     General: Bowel sounds are normal. There is no distension.     Palpations: Abdomen is soft.     Tenderness: There is no abdominal tenderness.  Musculoskeletal:     Right lower leg: No edema.     Left lower leg: No edema.     Comments: Unable to move left upper extremity does have splint Left lower extremity is weak Is able to move right extremities.    Back is tender to palpation    Lymphadenopathy:     Cervical: No cervical adenopathy.  Skin:    General: Skin is warm and dry.  Neurological:     Mental Status: She is alert. She is disoriented.       ASSESSMENT/ PLAN:  TODAY:   1. Chronic bilateral low back pain without sciatica has L2 compression fracture : is worse: will continue neurontin  300 mg twice daily robaxin 500 mg every 8 hours; will change her oxycodone to 5 mg every 4 hours as needed through 06-18-18; she is due for kyphoplasty this week     MD is aware of resident's narcotic use and is in agreement with current plan of care. We will attempt to wean resident as apropriate   Ok Edwards NP [facility]  Contact [phone] Monday through Friday 8am- 5pm  After hours call [phone]

## 2018-06-15 ENCOUNTER — Encounter
Admission: RE | Admit: 2018-06-15 | Discharge: 2018-06-15 | Disposition: A | Discharge status: Home / Self Care | Payer: Medicare Other | Source: Ambulatory Visit | Attending: Orthopedic Surgery | Admitting: Orthopedic Surgery

## 2018-06-15 NOTE — Patient Instructions (Signed)
Your procedure is scheduled on: 06/17/2018 Report to [place]. To find out your arrival time please call [phone] between 1PM - 3PM on 06/16/2018.  Remember: Instructions that are not followed completely may result in serious medical risk, up to and including death, or upon the discretion of your surgeon and anesthesiologist your surgery may need to be rescheduled.     _X__ 1. Do not eat food after midnight the night before your procedure.                 No gum chewing or hard candies. You may drink clear liquids up to 2 hours                 before you are scheduled to arrive for your surgery- DO not drink clear                 liquids within 2 hours of the start of your surgery.                 Clear Liquids include:  water, apple juice without pulp, clear carbohydrate                 drink such as Clearfast or Gatorade, Black Coffee or Tea (Do not add                 anything to coffee or tea).  __X__2.  On the morning of surgery brush your teeth with toothpaste and water, you                 may rinse your mouth with mouthwash if you wish.  Do not swallow any              toothpaste of mouthwash.     _X__ 3.  No Alcohol for 24 hours before or after surgery.   _X__ 4.  Do Not Smoke or use e-cigarettes For 24 Hours Prior to Your Surgery.                 Do not use any chewable tobacco products for at least 6 hours prior to                 surgery.  ____  5.  Bring all medications with you on the day of surgery if instructed.   __X__  6.  Notify your doctor if there is any change in your medical condition      (cold, fever, infections).     Do not wear jewelry, make-up, hairpins, clips or nail polish. Do not wear lotions, powders, or perfumes.  Do not shave 48 hours prior to surgery. Men may shave face and neck. Do not bring valuables to the hospital.    [facility] is not responsible for any belongings or  valuables.  Contacts, dentures/partials or body piercings may not be worn into surgery. Bring a case for your contacts, glasses or hearing aids, a denture cup will be supplied. Leave your suitcase in the car. After surgery it may be brought to your room. For patients admitted to the hospital, discharge time is determined by your treatment team.   Patients discharged the day of surgery will not be allowed to drive home.   Please read over the following fact sheets that you were given:   MRSA Information  __X__ Take these medicines the morning of surgery with A SIP OF WATER:    1. diltiazem (  CARDIZEM)  2. gabapentin (NEURONTIN)  3. pantoprazole (PROTONIX)   4. predniSONE (DELTASONE)   5. ipratropium-albuterol (DUONEB)  6.  ____ Fleet Enema (as directed)   ____ Use CHG Soap/SAGE wipes as directed  ____ Use inhalers on the day of surgery  ____ Stop metformin/Janumet/Farxiga 2 days prior to surgery    ____ Take 1/2 of usual insulin dose the night before surgery. No insulin the morning          of surgery.   ____ Stop Blood Thinners Coumadin/Plavix/Xarelto/Pleta/Pradaxa/Eliquis/Effient/Aspirin  on   Or contact your Surgeon, Cardiologist or Medical Doctor regarding  ability to stop your blood thinners  __X__ Stop Anti-inflammatories 7 days before surgery such as Advil, Ibuprofen, Motrin,  BC or Goodies Powder, Naprosyn, Naproxen, Aleve, Aspirin    __X__ Stop all herbal supplements, fish oil or vitamin E until after surgery.    ____ Bring C-Pap to the hospital.     TELEPHONE INSTRUCTIONS [phone]) TO TERRA WILSON LPN AT [place] VERBALIZED./cln

## 2018-06-16 MED ORDER — CEFAZOLIN SODIUM-DEXTROSE 2-4 GM/100ML-% IV SOLN
2.0000 g | Freq: Once | INTRAVENOUS | Status: AC
  Administered 2018-06-17: 2 g via INTRAVENOUS

## 2018-06-17 ENCOUNTER — Ambulatory Visit
Admission: RE | Admit: 2018-06-17 | Discharge: 2018-06-17 | Disposition: A | Discharge status: Home / Self Care | Payer: Medicare Other | Attending: Orthopedic Surgery | Admitting: Orthopedic Surgery

## 2018-06-17 ENCOUNTER — Encounter
Admission: RE | Disposition: A | Discharge status: Home / Self Care | Payer: Self-pay | Source: Home / Self Care | Attending: Orthopedic Surgery

## 2018-06-17 ENCOUNTER — Encounter: Payer: Self-pay | Admitting: *Deleted

## 2018-06-17 ENCOUNTER — Ambulatory Visit: Payer: Medicare Other

## 2018-06-17 DIAGNOSIS — F410 Panic disorder [episodic paroxysmal anxiety] without agoraphobia: Secondary | ICD-10-CM | POA: Diagnosis not present

## 2018-06-17 DIAGNOSIS — I69354 Hemiplegia and hemiparesis following cerebral infarction affecting left non-dominant side: Secondary | ICD-10-CM | POA: Insufficient documentation

## 2018-06-17 DIAGNOSIS — I351 Nonrheumatic aortic (valve) insufficiency: Secondary | ICD-10-CM | POA: Insufficient documentation

## 2018-06-17 DIAGNOSIS — I4891 Unspecified atrial fibrillation: Secondary | ICD-10-CM | POA: Diagnosis not present

## 2018-06-17 DIAGNOSIS — M810 Age-related osteoporosis without current pathological fracture: Secondary | ICD-10-CM | POA: Diagnosis not present

## 2018-06-17 DIAGNOSIS — I480 Paroxysmal atrial fibrillation: Secondary | ICD-10-CM | POA: Diagnosis not present

## 2018-06-17 DIAGNOSIS — M1990 Unspecified osteoarthritis, unspecified site: Secondary | ICD-10-CM | POA: Diagnosis not present

## 2018-06-17 DIAGNOSIS — K219 Gastro-esophageal reflux disease without esophagitis: Secondary | ICD-10-CM | POA: Diagnosis not present

## 2018-06-17 DIAGNOSIS — J449 Chronic obstructive pulmonary disease, unspecified: Secondary | ICD-10-CM | POA: Diagnosis not present

## 2018-06-17 DIAGNOSIS — I2510 Atherosclerotic heart disease of native coronary artery without angina pectoris: Secondary | ICD-10-CM | POA: Diagnosis not present

## 2018-06-17 DIAGNOSIS — N3941 Urge incontinence: Secondary | ICD-10-CM | POA: Insufficient documentation

## 2018-06-17 DIAGNOSIS — D649 Anemia, unspecified: Secondary | ICD-10-CM | POA: Insufficient documentation

## 2018-06-17 DIAGNOSIS — I10 Essential (primary) hypertension: Secondary | ICD-10-CM | POA: Insufficient documentation

## 2018-06-17 DIAGNOSIS — S32050A Wedge compression fracture of fifth lumbar vertebra, initial encounter for closed fracture: Secondary | ICD-10-CM | POA: Insufficient documentation

## 2018-06-17 DIAGNOSIS — I712 Thoracic aortic aneurysm, without rupture: Secondary | ICD-10-CM | POA: Diagnosis not present

## 2018-06-17 DIAGNOSIS — Z419 Encounter for procedure for purposes other than remedying health state, unspecified: Secondary | ICD-10-CM

## 2018-06-17 DIAGNOSIS — X58XXXA Exposure to other specified factors, initial encounter: Secondary | ICD-10-CM | POA: Diagnosis not present

## 2018-06-17 DIAGNOSIS — Z87891 Personal history of nicotine dependence: Secondary | ICD-10-CM | POA: Diagnosis not present

## 2018-06-17 DIAGNOSIS — I739 Peripheral vascular disease, unspecified: Secondary | ICD-10-CM | POA: Insufficient documentation

## 2018-06-17 DIAGNOSIS — F418 Other specified anxiety disorders: Secondary | ICD-10-CM | POA: Diagnosis not present

## 2018-06-17 HISTORY — PX: KYPHOPLASTY: SHX5884

## 2018-06-17 SURGERY — KYPHOPLASTY
Anesthesia: Monitor Anesthesia Care

## 2018-06-17 MED ORDER — PROPOFOL 500 MG/50ML IV EMUL
INTRAVENOUS | Status: DC | PRN
  Administered 2018-06-17: 30 ug/kg/min via INTRAVENOUS

## 2018-06-17 MED ORDER — ONDANSETRON HCL 4 MG/2ML IJ SOLN: 4.0000 mg | Freq: Four times a day (QID) | INTRAMUSCULAR | Status: DC | PRN

## 2018-06-17 MED ORDER — IOPAMIDOL (ISOVUE-M 200) INJECTION 41%
INTRAMUSCULAR | Status: DC | PRN
  Administered 2018-06-17: 30 mL

## 2018-06-17 MED ORDER — FENTANYL CITRATE (PF) 100 MCG/2ML IJ SOLN
25.0000 ug | INTRAMUSCULAR | Status: DC | PRN
  Administered 2018-06-17: 25 ug via INTRAVENOUS

## 2018-06-17 MED ORDER — SODIUM CHLORIDE 0.9 % IV SOLN: INTRAVENOUS | Status: DC

## 2018-06-17 MED ORDER — GLYCOPYRROLATE 0.2 MG/ML IJ SOLN
INTRAMUSCULAR | Status: AC
  Filled 2018-06-17: qty 1

## 2018-06-17 MED ORDER — CEFAZOLIN SODIUM-DEXTROSE 2-4 GM/100ML-% IV SOLN
INTRAVENOUS | Status: AC
  Filled 2018-06-17: qty 100

## 2018-06-17 MED ORDER — LACTATED RINGERS IV SOLN
INTRAVENOUS | Status: DC
  Administered 2018-06-17: 10:00:00 via INTRAVENOUS

## 2018-06-17 MED ORDER — FENTANYL CITRATE (PF) 100 MCG/2ML IJ SOLN
INTRAMUSCULAR | Status: AC
  Filled 2018-06-17: qty 2

## 2018-06-17 MED ORDER — LIDOCAINE HCL 1 % IJ SOLN
INTRAMUSCULAR | Status: DC | PRN
  Administered 2018-06-17: 25 mL

## 2018-06-17 MED ORDER — HYDROCODONE-ACETAMINOPHEN 5-325 MG PO TABS: 1.0000 | ORAL_TABLET | ORAL | Status: DC | PRN

## 2018-06-17 MED ORDER — METOCLOPRAMIDE HCL 5 MG/ML IJ SOLN: 5.0000 mg | Freq: Three times a day (TID) | INTRAMUSCULAR | Status: DC | PRN

## 2018-06-17 MED ORDER — METOCLOPRAMIDE HCL 10 MG PO TABS: 5.0000 mg | ORAL_TABLET | Freq: Three times a day (TID) | ORAL | Status: DC | PRN

## 2018-06-17 MED ORDER — KETAMINE HCL 10 MG/ML IJ SOLN
INTRAMUSCULAR | Status: DC | PRN
  Administered 2018-06-17: 25 mg via INTRAVENOUS

## 2018-06-17 MED ORDER — BUPIVACAINE-EPINEPHRINE (PF) 0.5% -1:200000 IJ SOLN
INTRAMUSCULAR | Status: DC | PRN
  Administered 2018-06-17: 15 mL

## 2018-06-17 MED ORDER — KETAMINE HCL 50 MG/ML IJ SOLN
INTRAMUSCULAR | Status: AC
  Filled 2018-06-17: qty 10

## 2018-06-17 MED ORDER — ONDANSETRON HCL 4 MG PO TABS: 4.0000 mg | ORAL_TABLET | Freq: Four times a day (QID) | ORAL | Status: DC | PRN

## 2018-06-17 MED ORDER — ONDANSETRON HCL 4 MG/2ML IJ SOLN: 4.0000 mg | Freq: Once | INTRAMUSCULAR | Status: DC | PRN

## 2018-06-17 MED ORDER — PROPOFOL 500 MG/50ML IV EMUL
INTRAVENOUS | Status: AC
  Filled 2018-06-17: qty 50

## 2018-06-17 SURGICAL SUPPLY — 20 items
CEMENT KYPHON CX01A KIT/MIXER (Cement) ×3 IMPLANT
COVER WAND RF STERILE (DRAPES) ×3 IMPLANT
DERMABOND ADVANCED (GAUZE/BANDAGES/DRESSINGS) ×2
DERMABOND ADVANCED .7 DNX12 (GAUZE/BANDAGES/DRESSINGS) ×1 IMPLANT
DEVICE BIOPSY BONE KYPHX (INSTRUMENTS) ×3 IMPLANT
DRAPE C-ARM XRAY 36X54 (DRAPES) ×3 IMPLANT
DURAPREP 26ML APPLICATOR (WOUND CARE) ×3 IMPLANT
FEE RENTAL RFA GENERATOR (MISCELLANEOUS) IMPLANT
GLOVE SURG SYN 9.0  PF PI (GLOVE) ×2
GLOVE SURG SYN 9.0 PF PI (GLOVE) ×1 IMPLANT
GOWN SRG 2XL LVL 4 RGLN SLV (GOWNS) ×1 IMPLANT
GOWN STRL NON-REIN 2XL LVL4 (GOWNS) ×2
GOWN STRL REUS W/ TWL LRG LVL3 (GOWN DISPOSABLE) ×1 IMPLANT
GOWN STRL REUS W/TWL LRG LVL3 (GOWN DISPOSABLE) ×2
PACK KYPHOPLASTY (MISCELLANEOUS) ×3 IMPLANT
RENTAL RFA  GENERATOR (MISCELLANEOUS)
RENTAL RFA GENERATOR (MISCELLANEOUS) IMPLANT
STRAP SAFETY 5IN WIDE (MISCELLANEOUS) ×3 IMPLANT
TRAY KYPHOPAK 15/3 EXPRESS 1ST (MISCELLANEOUS) ×3 IMPLANT
TRAY KYPHOPAK 20/3 EXPRESS 1ST (MISCELLANEOUS) ×1 IMPLANT

## 2018-06-17 NOTE — H&P (Signed)
Reviewed paper H+P, will be scanned into chart. No changes noted.  

## 2018-06-17 NOTE — Anesthesia Preprocedure Evaluation (Signed)
Anesthesia Evaluation  Patient identified by MRN, date of birth, ID band Patient awake    Reviewed: Allergy & Precautions, NPO status , Patient's Chart, lab work & pertinent test results  History of Anesthesia Complications Negative for: history of anesthetic complications  Airway Mallampati: III       Dental  (+) Lower Dentures, Upper Dentures   Pulmonary asthma , COPD,  COPD inhaler, former smoker,           Cardiovascular hypertension, Pt. on medications + CAD and + Peripheral Vascular Disease  + dysrhythmias Atrial Fibrillation      Neuro/Psych Anxiety Depression CVA (left sided weakness), Residual Symptoms    GI/Hepatic Neg liver ROS, GERD  Medicated and Poorly Controlled,  Endo/Other  negative endocrine ROS  Renal/GU Renal disease (cyst)     Musculoskeletal  (+) Arthritis , Osteoarthritis,    Abdominal   Peds  Hematology  (+) anemia ,   Anesthesia Other Findings Past Medical History: No date: Adenoma of rectum No date: Adrenal adenoma No date: Anemia No date: Anemia, unspecified No date: Arthritis No date: Ascending aortic aneurysm (HCC)     Comment:  a. s/p repair 07/2014 No date: Atrophic vaginitis No date: Benign neoplasm of colon, unspecified No date: COPD (chronic obstructive pulmonary disease) (HCC) No date: Coronary artery disease, non-occlusive     Comment:  a. [place] 06/2014 without evidence of obstructive disease No date: Depression No date: Diverticulosis No date: Dysphagia No date: Dysrhythmia No date: Essential hypertension     Comment:  benign No date: Frequent UTI No date: GERD (gastroesophageal reflux disease) No date: Hemiplegia and hemiparesis     Comment:  following cerebral infarction affecting left               non-dominant side No date: Hypertension No date: Microscopic hematuria No date: Nonrheumatic aortic valve insufficiency No date: Osteoporosis     Comment:   unspecified No date: Panic attacks No date: Paroxysmal atrial fibrillation (HCC) No date: Pernicious anemia No date: Renal cyst     Comment:  CKD STAGE 3 07/23/2014: Stroke [facility])     Comment:  a. post-operative setting in 07/2014 leading to left UE               hemiapresis and left LE weakness No date: Urge incontinence  Reproductive/Obstetrics                             Anesthesia Physical  Anesthesia Plan  ASA: III  Anesthesia Plan: General   Post-op Pain Management:    Induction: Intravenous  PONV Risk Score and Plan:   Airway Management Planned: Nasal Cannula  Additional Equipment:   Intra-op Plan:   Post-operative Plan:   Informed Consent: I have reviewed the patients History and Physical, chart, labs and discussed the procedure including the risks, benefits and alternatives for the proposed anesthesia with the patient or authorized representative who has indicated his/her understanding and acceptance.       Plan Discussed with:   Anesthesia Plan Comments:         Anesthesia Quick Evaluation

## 2018-06-17 NOTE — Op Note (Signed)
Date  06/17/2018  Time  11:45 am   PATIENT: Ana Schultz   PRE-OPERATIVE DIAGNOSIS:  closed wedge compression fracture of L5   POST-OPERATIVE DIAGNOSIS:  closed wedge compression fracture of L5   PROCEDURE:  Procedure(s): KYPHOPLASTY L5  SURGEON: Laurene Footman, MD   ASSISTANTS: None   ANESTHESIA:   local and MAC   EBL:  No intake/output data recorded.   BLOOD ADMINISTERED:none   DRAINS: none    LOCAL MEDICATIONS USED:  MARCAINE    and XYLOCAINE    SPECIMEN:   None   DISPOSITION OF SPECIMEN:  Not applicable   COUNTS:  YES   TOURNIQUET:  * No tourniquets in log *   IMPLANTS: Bone cement   DICTATION: .Dragon Dictation  patient was brought to the operating room and after adequate anesthesia was obtained the patient was placed prone.  C arm was brought in in good visualization of the affected level obtained on both AP and lateral projections.  After patient identification and timeout procedures were completed, local anesthetic was infiltrated with 10 cc 1% Xylocaine infiltrated subcutaneously.  This is done the area on the right side of the planned approach.  The back was then prepped and draped you sterile manner and repeat timeout procedure carried out.  A spinal needle was brought down to the pedicle on the right side of  L5 and a 50-50 mix of 1% Xylocaine half percent Sensorcaine with epinephrine total of 20 cc injected.  After allowing this to set a small incision was made and the trocar was advanced into the vertebral body in an extrapedicular fashion.  Biopsy was not obtained Drilling was carried out balloon inserted with inflation to  for cc.  When the cement was appropriate consistency 5-1/2 cc were injected into the vertebral body without extravasation, good fill superior to inferior endplates and from right to left sides along the inferior endplate.  After the cement had set the trochar was removed and permanent C-arm views obtained.  The wound was closed with Dermabond  followed by Band-Aid   PLAN OF CARE: Discharge to home after PACU   PATIENT DISPOSITION:  PACU - hemodynamically stable.

## 2018-06-17 NOTE — Discharge Instructions (Signed)
Take it easy today and tomorrow and resume more normal activities on Saturday.  Remove Band-Aid on Saturday then okay to shower.  Call if you have problems tomorrow to the office [phone]

## 2018-06-17 NOTE — Transfer of Care (Signed)
Immediate Anesthesia Transfer of Care Note  Patient: Ana Schultz  Procedure(s) Performed: L5 KYPHOPLASTY (N/A )  Patient Location: PACU  Anesthesia Type:MAC  Level of Consciousness: awake and responds to stimulation  Airway & Oxygen Therapy: Patient Spontanous Breathing and Patient connected to nasal cannula oxygen  Post-op Assessment: Report given to RN and Post -op Vital signs reviewed and stable  Post vital signs: Reviewed and stable  Last Vitals:  Vitals Value Taken Time  BP 120/67 06/17/2018 11:49 AM  Temp 37.4 C 06/17/2018 11:49 AM  Pulse 72 06/17/2018 11:50 AM  Resp 16 06/17/2018 11:50 AM  SpO2 100 % 06/17/2018 11:50 AM  Vitals shown include unvalidated device data.  Last Pain:  Vitals:   06/17/18 1149  TempSrc: Temporal  PainSc:          Complications: No apparent anesthesia complications

## 2018-06-17 NOTE — Anesthesia Post-op Follow-up Note (Signed)
Anesthesia QCDR form completed.        

## 2018-06-17 NOTE — OR Nursing (Signed)
EKG done by Colonel Bald, NT and placed on chart [phone]

## 2018-06-18 ENCOUNTER — Encounter: Payer: Self-pay | Admitting: Orthopedic Surgery

## 2018-06-18 ENCOUNTER — Non-Acute Institutional Stay (SKILLED_NURSING_FACILITY): Payer: Medicare Other | Admitting: Adult Health

## 2018-06-18 DIAGNOSIS — S32050D Wedge compression fracture of fifth lumbar vertebra, subsequent encounter for fracture with routine healing: Secondary | ICD-10-CM | POA: Diagnosis not present

## 2018-06-18 DIAGNOSIS — I480 Paroxysmal atrial fibrillation: Secondary | ICD-10-CM | POA: Diagnosis not present

## 2018-06-18 DIAGNOSIS — I69354 Hemiplegia and hemiparesis following cerebral infarction affecting left non-dominant side: Secondary | ICD-10-CM

## 2018-06-18 DIAGNOSIS — F325 Major depressive disorder, single episode, in full remission: Secondary | ICD-10-CM

## 2018-06-18 DIAGNOSIS — J438 Other emphysema: Secondary | ICD-10-CM

## 2018-06-18 DIAGNOSIS — I129 Hypertensive chronic kidney disease with stage 1 through stage 4 chronic kidney disease, or unspecified chronic kidney disease: Secondary | ICD-10-CM

## 2018-06-18 DIAGNOSIS — I6930 Unspecified sequelae of cerebral infarction: Secondary | ICD-10-CM

## 2018-06-18 NOTE — Anesthesia Postprocedure Evaluation (Signed)
Anesthesia Post Note  Patient: Ana Schultz  Procedure(s) Performed: L5 KYPHOPLASTY (N/A )  Patient location during evaluation: PACU Anesthesia Type: MAC Level of consciousness: awake and alert and oriented Pain management: pain level controlled Vital Signs Assessment: post-procedure vital signs reviewed and stable Respiratory status: spontaneous breathing Cardiovascular status: blood pressure returned to baseline Anesthetic complications: no     Last Vitals:  Vitals:   06/17/18 1237 06/17/18 1312  BP: (!) 141/41 (!) 137/45  Pulse: 67 65  Resp: 16 12  Temp: 36.8 C   SpO2: 90% 94%    Last Pain:  Vitals:   06/17/18 1330  TempSrc:   PainSc: 0-No pain                 [name]

## 2018-06-18 NOTE — Progress Notes (Signed)
Location:   [place] [facility] Room Number: [place] of Service:  SNF (31)   CODE STATUS: DNR  Allergies  Allergen Reactions  . Iodinated Diagnostic Agents Itching and Swelling    Other reaction(s): Unknown  . Nitrofurantoin Diarrhea    Other reaction(s): Unknown  . Omnipaque [Iohexol]   . Solifenacin Other (See Comments)    indigestion  . Ciprofloxacin Other (See Comments) and Rash    Colitis  . Metronidazole Itching and Rash  . Sulfa Antibiotics Itching and Rash    Chief Complaint  Patient presents with  . Hospitalization Follow-up    Hospital Follow up    HPI:  She is a 85 year old long term resident of this facility who has been hospitalized overnight for a closed wedge compression fracture of L5. She had a kyphoplasty of L5 done on 06-17-18. She states that her back pain is better. She denies any changes in appetite; no insomnia; heart burn; headaches. She will continue to be followed for her chronic illnesses including: afib; cva; copd   Past Medical History:  Diagnosis Date  . Adenoma of rectum   . Adrenal adenoma   . Anemia   . Anemia, unspecified   . Arthritis   . Ascending aortic aneurysm ([place])    a. s/p repair 07/2014  . Atrophic vaginitis   . Benign neoplasm of colon, unspecified   . COPD (chronic obstructive pulmonary disease) ([place])   . Coronary artery disease, non-occlusive    a. LHC 06/2014 without evidence of obstructive disease  . Depression   . Diverticulosis   . Dysphagia   . Dysrhythmia   . Essential hypertension    benign  . Frequent UTI   . GERD (gastroesophageal reflux disease)   . Hemiplegia and hemiparesis    following cerebral infarction affecting left non-dominant side  . Hypertension   . Microscopic hematuria   . Nonrheumatic aortic valve insufficiency   . Osteoporosis    unspecified  . Panic attacks   . Paroxysmal atrial fibrillation (H[facility])   . Pernicious anemia   . Renal cyst    CKD STAGE 3  . Stroke  ([place]) 07/23/2014   a. post-operative setting in 07/2014 leading to left UE hemiapresis and left LE weakness  . Urge incontinence     Past Surgical History:  Procedure Laterality Date  . ABDOMINAL HYSTERECTOMY  1972  . AORTOILIAC BYPASS    . ASCENDING AORTIC ANEURYSM REPAIR  07/23/2014   07/2014  . BLEPHAROPLASTY    . CATARACT EXTRACTION  2005  . GASTROSTOMY W/ FEEDING TUBE    . INTRAMEDULLARY (IM) NAIL INTERTROCHANTERIC Left 02/26/2016   Procedure: INTRAMEDULLARY (IM) NAIL INTERTROCHANTRIC;  Surgeon: Earnestine Leys, MD;  Location: [facility] ORS;  Service: Orthopedics;  Laterality: Left;  . KYPHOPLASTY N/A 08/26/2016   Procedure: KYPHOPLASTY T12;  Surgeon: Hessie Knows, MD;  Location: [facility] ORS;  Service: Orthopedics;  Laterality: N/A;  . KYPHOPLASTY N/A 06/17/2018   Procedure: L5 KYPHOPLASTY;  Surgeon: Hessie Knows, MD;  Location: [facility] ORS;  Service: Orthopedics;  Laterality: N/A;  . LEFT HEART CATH  06/2014  . PR ASCEND AORTIC GRAFT INCL VALVE SUSPENSION  07/07/2014   Procedure: ASCENDING AORTA GRAFT, WITH CARDIOPULMONARY BYPASS, WITH OR WITHOUT VALVE SUSPENSION; Surgeon: Dwaine Deter, MD; Location: MAIN OR [facility]; Service: Cardiothoracic  . PR LAP, GASTROTOMY W/O TUBE CONSTR  08/08/2014   Procedure: LAPAROSCOPY, SURGICAL; GASTOSTOMY W/O CONSTRUCTION OF GASTRIC TUBE (EG, STAMM PROCEDURE)(SEPARATE PROCED); Surgeon: Fredrik Rigger, MD;  Location: MAIN OR [facility]; Service: Gastrointestinal  . PR PLACE PERCUT GASTROSTOMY TUBE  08/08/2014   Procedure: UGI ENDO; W/DIRECTED PLCMT PERQ GASTROSTOMY TUBE; Surgeon: Fredrik Rigger, MD; Location: MAIN OR [facility]; Service: Gastrointestinal    Social History   Socioeconomic History  . Marital status: Widowed    Spouse name: Not on file  . Number of children: 3  . Years of education: 76  . Highest education level: Not on file  Occupational History  . Not on file  Social Needs  . Financial resource strain: Not on file  . Food insecurity:      Worry: Not on file    Inability: Not on file  . Transportation needs:    Medical: Not on file    Non-medical: Not on file  Tobacco Use  . Smoking status: Former Smoker    Packs/day: 0.50    Years: 50.00    Pack years: 25.00    Types: Cigarettes    Last attempt to quit: 05/30/2014    Years since quitting: 4.0  . Smokeless tobacco: Never Used  Substance and Sexual Activity  . Alcohol use: No    Comment: occasionally  . Drug use: No  . Sexual activity: Not on file  Lifestyle  . Physical activity:    Days per week: Not on file    Minutes per session: Not on file  . Stress: Not on file  Relationships  . Social connections:    Talks on phone: Not on file    Gets together: Not on file    Attends religious service: Not on file    Active member of club or organization: Not on file    Attends meetings of clubs or organizations: Not on file    Relationship status: Not on file  . Intimate partner violence:    Fear of current or ex partner: Not on file    Emotionally abused: Not on file    Physically abused: Not on file    Forced sexual activity: Not on file  Other Topics Concern  . Not on file  Social History Narrative   Admitted to [facility] 03/01/2016   Widowed   4 children   Former Smoker   Occasionally drinks   DNR   Family History  Problem Relation Age of Onset  . Lung cancer Son   . CAD Father   . Heart attack Father   . CAD Brother   . Heart attack Brother   . Stroke Mother       VITAL SIGNS BP (!) 132/54   Pulse 62   Temp 98.5 F (36.9 C)   Resp 20   Ht 5\' 1"  (1.549 m)   Wt 133 lb 8 oz (60.6 kg)   SpO2 95%   BMI 25.22 kg/m   Outpatient Encounter Medications as of 06/18/2018  Medication Sig  . acetaminophen (TYLENOL) 325 MG tablet Take 650 mg by mouth 4 (four) times daily.   Marland Kitchen albuterol (PROVENTIL HFA;VENTOLIN HFA) 108 (90 Base) MCG/ACT inhaler Inhale 2 puffs into the lungs every 4 (four) hours as needed for wheezing or shortness of  breath.  Marland Kitchen alum & mag hydroxide-simeth (MAALOX PLUS) 400-400-40 MG/5ML suspension Take 30 mLs by mouth every 4 (four) hours as needed.  Marland Kitchen aspirin EC 81 MG tablet Take 81 mg by mouth daily.  Marland Kitchen BREO ELLIPTA 100-25 MCG/INH AEPB Inhale 1 puff into the lungs daily. Rinse mouth well after use  . carboxymethylcellulose (REFRESH TEARS) 0.5 %  SOLN Place 2 drops into both eyes. 1 - 4 times daily as needed  . diclofenac sodium (VOLTAREN) 1 % GEL Apply 4 g topically t bilateral hips and knees every 8 hours as needed for pain  . diltiazem (CARDIZEM) 60 MG tablet Take 60 mg by mouth daily.  Marland Kitchen docusate sodium (COLACE) 100 MG capsule Take 1 capsule (100 mg total) by mouth 2 (two) times daily.  Marland Kitchen gabapentin (NEURONTIN) 300 MG capsule Take 300 mg by mouth 2 (two) times daily.   Marland Kitchen ipratropium-albuterol (DUONEB) 0.5-2.5 (3) MG/3ML SOLN Take 3 mLs by nebulization 3 (three) times daily.  . Lidocaine 4 % PTCH Apply 1 patch daily to right Flank / sore ribs.  Remove after 12 hours  . magnesium hydroxide (MILK OF MAGNESIA) 400 MG/5ML suspension Take 30 mLs by mouth daily as needed for mild constipation.  . methocarbamol (ROBAXIN) 500 MG tablet Take 500 mg by mouth every 8 (eight) hours.   . montelukast (SINGULAIR) 10 MG tablet Take 10 mg by mouth at bedtime.  Marland Kitchen oxycodone (OXY-IR) 5 MG capsule Take 1 capsule (5 mg total) by mouth every 4 (four) hours as needed for up to 4 days. For moderate or severe pain  . OXYGEN Inhale 2 L into the lungs continuous as needed.  . pantoprazole (PROTONIX) 40 MG tablet Take 40 mg by mouth daily.  Vladimir Faster Glycol-Propyl Glycol (SYSTANE) 0.4-0.3 % GEL ophthalmic gel Place 1 application into both eyes at bedtime. (2 drops ) for dry eyes (Ointments are on Backorder)  . polyethylene glycol (MIRALAX) packet Take 17 g by mouth 2 (two) times daily.  . potassium chloride (K-DUR,KLOR-CON) 10 MEQ tablet Take 10 mEq by mouth daily.   . predniSONE (DELTASONE) 5 MG tablet Take 5 mg by mouth daily with  breakfast.   . senna-docusate (SENNA-PLUS) 8.6-50 MG tablet Take 2 tablets by mouth 2 (two) times daily.   . sertraline (ZOLOFT) 100 MG tablet Take 100 mg by mouth daily.   . sertraline (ZOLOFT) 50 MG tablet Take 50 mg by mouth daily.  . Skin Protectants, Misc. (MINERIN) CREA Apply liberal amount daily to arms and legs after the bath and PRN for skin integrity  . sodium phosphate Pediatric (FLEET) 3.5-9.5 GM/59ML enema Place 1 enema rectally once as needed for severe constipation. Take once every 3 days if constipation is not relieved by Milk of magnesia or Bisacodyl Suppository  . torsemide (DEMADEX) 10 MG tablet Take 10 mg by mouth daily.   Marland Kitchen UNABLE TO FIND Diet Type: NAS   No facility-administered encounter medications on file as of 06/18/2018.      SIGNIFICANT DIAGNOSTIC EXAMS  PREVIOUS :   06-08-18: sacral/coccxy no fracture  06-08-18: right hip x-ray: no fracture moderate degenerative changes  06-08-18: lumbar spine x-ray: compression fracture L2 verterbral body indeterminate age status post vertebroplasty T12 and L3  06-10-18: lumbar spine MRI:  1. New benign-appearing a slight compression fracture of the superior endplate of L5 without neural impingement. 2. Old compression fractures at T11, T12, L2, and L3. 3. No neural impingement in the lumbar spine.  NO NEW EXAMS.     LABS REVIEWED PREVIOUS   02-09-18: wbc 6.5 hgb 11.0; hct 33.1; mcv 94.0 plt 165 10 -31-19: wbc 6.6; hgb 10.3; hct 32.5; mcv 96.4; plt  178; glucose 88; bun 30; creat 1.27; k+ 4.9; na++ 141; ca 8.5 06-11-18: wbc 4.4 ;hgb 11.0; hct 34.3; mcv 912; plt 198; glucose 105; bun 30; creat 1.19; k+ 4.4; na++  131; ca 9.0 liver normal albumin 3.7   NO NEW LABS.    Review of Systems  Constitutional: Negative for malaise/fatigue.  Respiratory: Negative for cough and shortness of breath.   Cardiovascular: Negative for chest pain, palpitations and leg swelling.  Gastrointestinal: Negative for abdominal pain, constipation  and heartburn.  Musculoskeletal: Positive for back pain. Negative for joint pain and myalgias.       Is better   Skin: Negative.   Neurological: Negative for dizziness.  Psychiatric/Behavioral: The patient is not nervous/anxious.     Physical Exam Constitutional:      General: She is not in acute distress.    Appearance: She is well-developed. She is not diaphoretic.  Neck:     Musculoskeletal: Neck supple.     Thyroid: No thyromegaly.  Cardiovascular:     Rate and Rhythm: Normal rate and regular rhythm.     Pulses: Normal pulses.     Heart sounds: Normal heart sounds.  Pulmonary:     Effort: Pulmonary effort is normal. No respiratory distress.     Breath sounds: Normal breath sounds.  Abdominal:     General: Bowel sounds are normal. There is no distension.     Palpations: Abdomen is soft.     Tenderness: There is no abdominal tenderness.  Musculoskeletal:     Right lower leg: No edema.     Left lower leg: No edema.     Comments: Unable to move left upper extremity does have splint Left lower extremity is weak Is able to move right extremities.     Lymphadenopathy:     Cervical: No cervical adenopathy.  Skin:    General: Skin is warm and dry.  Neurological:     Mental Status: She is alert and oriented to person, place, and time.  Psychiatric:        Mood and Affect: Mood normal.    ASSESSMENT/ PLAN:  TODAY:   1. Chronic bilateral low back pain without sciatica/history of T11, T12; L2; L3; kyphoplasty T12: status post kyphoplasty L5 on 06-17-18;  : is stable will continue tylenol 650 mg four times daily robaxin 500 mg every 8 hours  lidoderm 4% pack to right flank and right arm daily; has voltaren gel 4 gm to lower back four times daily and 4 gm every 8 hours as needed to knees  Will continue oxycodone 5 mg every 4 hours as needed through 06-25-18.   2. Chronic cerebrovascular accident with hemiplegia and hemiparesis following cerebral infarction affecting left  non-dominant side: is neurologically stable will continue asa 81 mg daily   3. Hypokalemia: is stable will continue k+ 10 meq daily   4. Bilateral lower extremity edema: is stable will continue demadex  10 mg daily   5. Chronic constipation: is stable will continue miralax twice daily colace twice daily   6. Hypertensive chronic kidney disease w stg 1-4/unsp chr kdny: is stable b/p 132/54: will monitor   7. Chronic kidney disease stage III (moderate): is stable bun 30 creat 1.19 will monitor  8. 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 Neuropathic pain: is under control: will continue neurontin 300 mg twice daily   10. Vitamin B12 deficiency anemia due to intrinsic factor deficiency: is stable is off iron and will continue vit B 12  11. Dyslipidemia: stable is off  lipitor   12. Paroxysmal atrial fibrillation: heart rate is stable: will continue asa 81 mg  daily cardizem 60 mg daily for rate control. Is off eliquis    13. Other emphysema: is stable will continue breo inhaler daily; duoneb three times daily albuterol inhaler 2 puffs every 4 hours as needed has 02 as needed and flonase daily will continue prednisone 5 mg daily   14. GERD without esophagitis: is stable will continue protonix 40 mg daily     MD is aware of resident's narcotic use and is in agreement with current plan of care. We will attempt to wean resident as apropriate   Ok Edwards NP [facility]  Contact [phone] Monday through Friday 8am- 5pm  After hours call [phone]

## 2018-06-20 DIAGNOSIS — S32050D Wedge compression fracture of fifth lumbar vertebra, subsequent encounter for fracture with routine healing: Secondary | ICD-10-CM | POA: Insufficient documentation

## 2018-07-05 ENCOUNTER — Encounter: Payer: Self-pay | Admitting: Adult Health

## 2018-07-05 ENCOUNTER — Encounter
Admission: RE | Admit: 2018-07-05 | Discharge: 2018-07-05 | Disposition: A | Discharge status: Home / Self Care | Payer: Medicare Other | Source: Ambulatory Visit | Attending: Internal Medicine | Admitting: Internal Medicine

## 2018-07-05 ENCOUNTER — Non-Acute Institutional Stay (SKILLED_NURSING_FACILITY): Payer: Medicare Other | Admitting: Adult Health

## 2018-07-05 DIAGNOSIS — M545 Low back pain: Secondary | ICD-10-CM | POA: Diagnosis not present

## 2018-07-05 DIAGNOSIS — F325 Major depressive disorder, single episode, in full remission: Secondary | ICD-10-CM | POA: Diagnosis not present

## 2018-07-05 DIAGNOSIS — J438 Other emphysema: Secondary | ICD-10-CM

## 2018-07-05 DIAGNOSIS — S32050D Wedge compression fracture of fifth lumbar vertebra, subsequent encounter for fracture with routine healing: Secondary | ICD-10-CM | POA: Diagnosis not present

## 2018-07-05 DIAGNOSIS — R195 Other fecal abnormalities: Secondary | ICD-10-CM

## 2018-07-05 DIAGNOSIS — G8929 Other chronic pain: Secondary | ICD-10-CM

## 2018-07-05 DIAGNOSIS — Z8673 Personal history of transient ischemic attack (TIA), and cerebral infarction without residual deficits: Secondary | ICD-10-CM

## 2018-07-05 DIAGNOSIS — I129 Hypertensive chronic kidney disease with stage 1 through stage 4 chronic kidney disease, or unspecified chronic kidney disease: Secondary | ICD-10-CM

## 2018-07-05 DIAGNOSIS — I69354 Hemiplegia and hemiparesis following cerebral infarction affecting left non-dominant side: Secondary | ICD-10-CM

## 2018-07-05 MED ORDER — OXYCODONE HCL 5 MG PO TABS: 5.0000 mg | ORAL_TABLET | ORAL | 0 refills | Status: DC | PRN

## 2018-07-05 NOTE — Telephone Encounter (Signed)
This encounter was created in error - please disregard.

## 2018-07-05 NOTE — Progress Notes (Signed)
Location:  [facility] Number: [place]:  SNF ([phone]) Provider:  Durenda Age, NP  Patient Care Team: Juluis Pitch, MD as PCP - General (Family Medicine) Nyoka Cowden Phylis Bougie, NP as Nurse Practitioner (Geriatric Medicine)  Extended Emergency Contact Information Primary Emergency Contact: [name] Address: [address]          [place], [place] [address] [name] of [address] [place] Phone: [phone] Relation: Daughter Secondary Emergency Contact: Lingerfelt,Brad Address: [address] [place], [place] [address] [name] of [place] Phone: [phone] Relation: [name]  Code Status:  DNR  Goals of care: Advanced Directive information Advanced Directives 06/18/2018  Does Patient Have a Medical Advance Directive? Yes  Type of Advance Directive Out of facility DNR (pink MOST or yellow form)  Does patient want to make changes to medical advance directive? No - Patient declined  Would patient like information on creating a medical advance directive? -  Pre-existing out of facility DNR order (yellow form or pink MOST form) Yellow form placed in chart (order not valid for inpatient use)     Chief Complaint  Patient presents with  . Acute Visit    Patient is seen for pain management.    HPI:  Pt is a 85 y.o. female seen today for an acute visit for pain management.  She is a long-term care resident of [facility].  She has a PMH of AAA, COPD, CAD, essential hypertension, GERD, PAF, stroke, and osteoporosis. She was seen in the room today. Family came to visit her today. She said that she has back pain, 6/10. She has Oxycodone 4mg  Q 4 hours PRN previous but orders has been discontinued.Nursing staff reported that she has been having increased loose stools. She denies abdominal cramping. She is currently on Senna-S and Miralax routinely. She had a recent, 06/17/18,  kyphoplasty  For her closed wedge compression  fracture of L5.   Past Medical History:  Diagnosis Date  . Adenoma of rectum   . Adrenal adenoma   . Anemia   . Anemia, unspecified   . Arthritis   . Ascending aortic aneurysm ([place])    a. s/p repair 07/2014  . Atrophic vaginitis   . Benign neoplasm of colon, unspecified   . COPD (chronic obstructive pulmonary disease) ([place])   . Coronary artery disease, non-occlusive    a. LHC 06/2014 without evidence of obstructive disease  . Depression   . Diverticulosis   . Dysphagia   . Dysrhythmia   . Essential hypertension    benign  . Frequent UTI   . GERD (gastroesophageal reflux disease)   . Hemiplegia and hemiparesis    following cerebral infarction affecting left non-dominant side  . Hypertension   . Microscopic hematuria   . Nonrheumatic aortic valve insufficiency   . Osteoporosis    unspecified  . Panic attacks   . Paroxysmal atrial fibrillation (HCC)   . Pernicious anemia   . Renal cyst    CKD STAGE 3  . Stroke ([place]) 07/23/2014   a. post-operative setting in 07/2014 leading to left UE hemiapresis and left LE weakness  . Urge incontinence    Past Surgical History:  Procedure Laterality Date  . ABDOMINAL HYSTERECTOMY  1972  . AORTOILIAC BYPASS    . ASCENDING AORTIC ANEURYSM REPAIR  07/23/2014   07/2014  . BLEPHAROPLASTY    . CATARACT EXTRACTION  2005  . GASTROSTOMY W/  FEEDING TUBE    . INTRAMEDULLARY (IM) NAIL INTERTROCHANTERIC Left 02/26/2016   Procedure: INTRAMEDULLARY (IM) NAIL INTERTROCHANTRIC;  Surgeon: Earnestine Leys, MD;  Location: [facility] ORS;  Service: Orthopedics;  Laterality: Left;  . KYPHOPLASTY N/A 08/26/2016   Procedure: KYPHOPLASTY T12;  Surgeon: Hessie Knows, MD;  Location: [facility] ORS;  Service: Orthopedics;  Laterality: N/A;  . KYPHOPLASTY N/A 06/17/2018   Procedure: L5 KYPHOPLASTY;  Surgeon: Hessie Knows, MD;  Location: [facility] ORS;  Service: Orthopedics;  Laterality: N/A;  . LEFT HEART CATH  06/2014  . PR ASCEND AORTIC GRAFT INCL VALVE SUSPENSION  07/07/2014    Procedure: ASCENDING AORTA GRAFT, WITH CARDIOPULMONARY BYPASS, WITH OR WITHOUT VALVE SUSPENSION; Surgeon: Dwaine Deter, MD; Location: MAIN OR [facility]; Service: Cardiothoracic  . PR LAP, GASTROTOMY W/O TUBE CONSTR  08/08/2014   Procedure: LAPAROSCOPY, SURGICAL; GASTOSTOMY W/O CONSTRUCTION OF GASTRIC TUBE (EG, STAMM PROCEDURE)(SEPARATE PROCED); Surgeon: Fredrik Rigger, MD; Location: MAIN OR [facility]; Service: Gastrointestinal  . PR PLACE PERCUT GASTROSTOMY TUBE  08/08/2014   Procedure: UGI ENDO; W/DIRECTED PLCMT PERQ GASTROSTOMY TUBE; Surgeon: Fredrik Rigger, MD; Location: MA[facility]; Service: Gastrointestinal    Allergies  Allergen Reactions  . Iodinated Diagnostic Agents Itching and Swelling    Other reaction(s): Unknown  . Nitrofurantoin Diarrhea    Other reaction(s): Unknown  . Omnipaque [Iohexol]   . Solifenacin Other (See Comments)    indigestion  . Ciprofloxacin Other (See Comments) and Rash    Colitis  . Metronidazole Itching and Rash  . Sulfa Antibiotics Itching and Rash    Outpatient Encounter Medications as of 07/05/2018  Medication Sig  . acetaminophen (TYLENOL) 325 MG tablet Take 650 mg by mouth 4 (four) times daily.   Marland Kitchen albuterol (PROVENTIL HFA;VENTOLIN HFA) 108 (90 Base) MCG/ACT inhaler Inhale 2 puffs into the lungs every 4 (four) hours as needed for wheezing or shortness of breath.  Marland Kitchen alum & mag hydroxide-simeth (MAALOX PLUS) 400-400-40 MG/5ML suspension Take 30 mLs by mouth every 4 (four) hours as needed.  Marland Kitchen aspirin EC 81 MG tablet Take 81 mg by mouth daily.  Marland Kitchen BREO ELLIPTA 100-25 MCG/INH AEPB Inhale 1 puff into the lungs daily. Rinse mouth well after use  . carboxymethylcellulose (REFRESH TEARS) 0.5 % SOLN Place 2 drops into both eyes. 1 - 4 times daily as needed  . diclofenac sodium (VOLTAREN) 1 % GEL Apply 4 g topically t bilateral hips and knees every 8 hours as needed for pain  . diltiazem (CARDIZEM) 60 MG tablet Take 60 mg by mouth daily.  Marland Kitchen  docusate sodium (COLACE) 100 MG capsule Take 1 capsule (100 mg total) by mouth 2 (two) times daily.  Marland Kitchen gabapentin (NEURONTIN) 300 MG capsule Take 300 mg by mouth every 12 (twelve) hours.   Marland Kitchen ipratropium-albuterol (DUONEB) 0.5-2.5 (3) MG/3ML SOLN Take 3 mLs by nebulization 3 (three) times daily.   . Lidocaine 4 % PTCH Apply 1 patch daily to right Flank / sore ribs.  Remove after 12 hours  . magnesium hydroxide (MILK OF MAGNESIA) 400 MG/5ML suspension Take 30 mLs by mouth daily as needed for mild constipation.  . methocarbamol (ROBAXIN) 500 MG tablet Take 500 mg by mouth every 8 (eight) hours.   . montelukast (SINGULAIR) 10 MG tablet Take 10 mg by mouth at bedtime.  . OXYGEN Inhale 2 L into the lungs continuous as needed.  . pantoprazole (PROTONIX) 40 MG tablet Take 40 mg by mouth daily.  Vladimir Faster Glycol-Propyl Glycol (SYSTANE) 0.4-0.3 % GEL ophthalmic gel Place  1 application into both eyes at bedtime. (2 drops ) for dry eyes (Ointments are on Backorder)  . polyethylene glycol (MIRALAX) packet Take 17 g by mouth 2 (two) times daily.  . potassium chloride (K-DUR,KLOR-CON) 10 MEQ tablet Take 10 mEq by mouth daily.   . predniSONE (DELTASONE) 5 MG tablet Take 5 mg by mouth daily with breakfast.   . senna-docusate (SENNA-PLUS) 8.6-50 MG tablet Take 2 tablets by mouth 2 (two) times daily.   . sertraline (ZOLOFT) 100 MG tablet Take 100 mg by mouth daily.   . sertraline (ZOLOFT) 50 MG tablet Take 50 mg by mouth daily.  . Skin Protectants, Misc. (MINERIN) CREA Apply liberal amount daily to arms and legs after the bath and PRN for skin integrity  . sodium phosphate Pediatric (FLEET) 3.5-9.5 GM/59ML enema Place 1 enema rectally once as needed for severe constipation. Take once every 3 days if constipation is not relieved by Milk of magnesia or Bisacodyl Suppository  . torsemide (DEMADEX) 10 MG tablet Take 10 mg by mouth daily.   Marland Kitchen UNABLE TO FIND Diet Type: NAS   No facility-administered encounter  medications on file as of 07/05/2018.     Review of Systems  GENERAL: No change in appetite, no fatigue, no weight changes, no fever, chills or weakness MOUTH and THROAT: Denies oral discomfort, gingival pain or bleeding RESPIRATORY: no cough, SOB, DOE, wheezing, hemoptysis CARDIAC: No chest pain, edema or palpitations GI: No abdominal pain, diarrhea, constipation, heart burn, nausea or vomiting NEUROLOGICAL: Denies dizziness, syncope, numbness, or headache PSYCHIATRIC: Denies feelings of depression or anxiety. No report of hallucinations, insomnia, paranoia, or agitation   Immunization History  Administered Date(s) Administered  . Influenza Inj Mdck Quad Pf 02/14/2016  . Influenza-Unspecified 02/02/2014, 03/01/2016, 01/27/2017, 02/21/2018  . PPD Test 08/10/2014, 08/24/2014, 02/20/2016, 02/02/2018  . Pneumococcal Conjugate-13 06/21/2018  . Pneumococcal-Unspecified 02/20/2016   Pertinent  Health Maintenance Due  Topic Date Due  . INFLUENZA VACCINE  Completed  . PNA vac Low Risk Adult  Completed  . DEXA SCAN  Discontinued    Vitals:   07/05/18 1128  BP: (!) 138/58  Pulse: 68  Resp: 20  Temp: 97.7 F (36.5 C)  TempSrc: Oral  SpO2: 96%  Weight: 125 lb 12.8 oz (57.1 kg)  Height: 5\' 1"  (1.549 m)   Body mass index is 23.77 kg/m.  Physical Exam  GENERAL APPEARANCE: Well nourished. In no acute distress. Normal body habitus SKIN:  Skin is warm and dry.  MOUTH and THROAT: Lips are without lesions. Oral mucosa is moist and without lesions. RESPIRATORY: Breathing is even & unlabored, BS CTAB CARDIAC: RRR, no murmur,no extra heart sounds, no edema GI: Abdomen soft, normal BS, no masses, no tenderness NEUROLOGICAL: There is no tremor. Speech is clear. Left hemiplegia. Alert and oriented X 3 PSYCHIATRIC:  Affect and behavior are appropriate   Labs reviewed: Recent Labs    11/03/17 0640 03/04/18 0640 06/11/18 1730  NA 140 141 141  K 3.9 4.9 4.4  CL 103 105 107  CO2 30 30  26   GLUCOSE 87 88 105*  BUN 29* 30* 30*  CREATININE 1.07* 1.27* 1.19*  CALCIUM 8.7* 8.5* 9.0  MG 2.2  --   --    Recent Labs    11/03/17 0640 06/11/18 1730  AST 22 18  ALT 13 12  ALKPHOS 76 70  BILITOT 0.5 0.7  PROT 5.6* 5.7*  ALBUMIN 3.5 3.7   Recent Labs    02/09/18 0530 03/04/18 0640  06/11/18 1730  WBC 6.5 6.6 4.4  NEUTROABS 3.7 3.7 2.8  HGB 11.0* 10.3* 11.0*  HCT 33.1* 32.5* 34.3*  MCV 94.0 96.4 91.2  PLT 165 178 198   Lab Results  Component Value Date   TSH 5.829 (H) 11/03/2017   Lab Results  Component Value Date   HGBA1C 5.9 (H) 02/25/2016   Lab Results  Component Value Date   CHOL 153 11/03/2017   HDL 83 11/03/2017   LDLCALC 59 11/03/2017   TRIG 53 11/03/2017   CHOLHDL 1.8 11/03/2017    Significant Diagnostic Results in last 30 days:  Dg Lumbar Spine 2-3 Views  Result Date: 06/17/2018 CLINICAL DATA:  Imaging provided for vertebroplasty. EXAM: LUMBAR SPINE - 2-3 VIEW; DG C-ARM 61-120 MIN COMPARISON:  Lumbar MRI, 06/10/2018. FINDINGS: Portable images of the spine no previous vertebroplasty cement at the L3 level. Subsequent images show the placement of submit the the L5 vertebra overlies the recent fracture. No evidence of a procedure complication on the images submitted. IMPRESSION: Portable fluoroscopic imaging provided for L5 vertebroplasty. Electronically Signed   By: Lajean Manes M.D.   On: 06/17/2018 13:27   Mr Lumbar Spine Wo Contrast  Result Date: 06/10/2018 CLINICAL DATA:  Back pain. Compression fracture of the lumbar spine. EXAM: MRI LUMBAR SPINE WITHOUT CONTRAST TECHNIQUE: Multiplanar, multisequence MR imaging of the lumbar spine was performed. No intravenous contrast was administered. COMPARISON:  MRI dated 08/07/2016 and radiographs dated 01/26/2016 FINDINGS: Segmentation:  Standard. Alignment: There is slight chronic retropulsion of the posterior margins of L2 and L3 into the spinal canal from previous now healed compression fractures.  Alignment is otherwise physiologic. Vertebrae: There is an acute benign-appearing slight compression fracture of the superior endplate of L5. There are old compression fractures of T11, T12, L2 and L3. The compression fractures at T12 and L3 have been treated with vertebroplasty. Conus medullaris and cauda equina: Conus extends to the L1-2 level. Conus and cauda equina appear normal. Paraspinal and other soft tissues: No significant abnormality. Several benign cysts on the left kidney, unchanged. Disc levels: L1-2: Old healed compression fracture of L2. Tiny broad-based disc bulge with no neural impingement, unchanged. Slight chronic protrusion of the posterosuperior aspect of the L2 vertebral body into the spinal canal without neural impingement. L2-3: Old compression fractures involving the inferior endplate of L2 and the superior endplate of L3. Slight chronic protrusion of the posterosuperior aspect of the L3 vertebral body into the spinal canal without neural impingement. L3-4: Negative. L4-5: Acute slight benign-appearing compression fracture of the superior endplate of L5. No disc bulging or protrusion. No protrusion of bone into the spinal canal. L5-S1: Negative. IMPRESSION: 1. New benign-appearing a slight compression fracture of the superior endplate of L5 without neural impingement. 2. Old compression fractures at T11, T12, L2, and L3. 3. No neural impingement in the lumbar spine. Electronically Signed   By: Lorriane Shire M.D.   On: 06/10/2018 15:10   Dg C-arm 1-60 Min  Result Date: 06/17/2018 CLINICAL DATA:  Imaging provided for vertebroplasty. EXAM: LUMBAR SPINE - 2-3 VIEW; DG C-ARM 61-120 MIN COMPARISON:  Lumbar MRI, 06/10/2018. FINDINGS: Portable images of the spine no previous vertebroplasty cement at the L3 level. Subsequent images show the placement of submit the the L5 vertebra overlies the recent fracture. No evidence of a procedure complication on the images submitted. IMPRESSION: Portable  fluoroscopic imaging provided for L5 vertebroplasty. Electronically Signed   By: Lajean Manes M.D.   On: 06/17/2018 13:27  Assessment/Plan  1. Closed wedge compression fracture of L5 vertebra with routine healing -S/P kyphoplasty on 06/17/2018, continue Robaxin 500 mg 1 tab every 8 hours  2. Chronic bilateral low back pain without sciatica -6/10 pain on back, will continue oxycodone 5 mg 1 tab every 4 hours as needed, gabapentin 300 mg 1 capsule every 12 hours  3. Other emphysema ([place]) -No SOB nor wheezing, continue Breo Ellipta 100-25 mcg 1 inhalation daily, ipratropium-albuterol 0.5-3mg   (2.5 mg base)/3 mL inhalation 3 times a day, montelukast 10 mg 1 tab at bedtime, prednisone 5 mg 1 tab daily  4. 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 History of CVA (cerebrovascular accident) -Stable, continue aspirin 81 mg 1 tab daily  6. Hemiplegia and hemiparesis following cerebral infarction affecting left non-dominant side (HCC) -Continue supportive care, assist patient's in repositioning, fall precautions  7. Loose bowel movements - will change MiraLAX from BID to PRN and continue senna +8.6-50 mg 2 tabs twice a day  8.  Hypertensive chronic kidney disease Lab Results  Component Value Date   CREATININE 1.19 (H) 06/11/2018  -Continue torsemide 10 mg 1 tab daily, K[place]-Con 10 meq 1 tablet daily    Family/ staff Communication: Discussed plan of care with resident.   Labs/tests ordered: None  Goals of care:   Long-term care.   Durenda Age, NP [facility] [phone] (Monday-Friday 8:00 a.m. - 5:00 p.m.) [phone] (after hours)

## 2018-07-12 ENCOUNTER — Non-Acute Institutional Stay (SKILLED_NURSING_FACILITY): Payer: Medicare Other | Admitting: Adult Health

## 2018-07-12 ENCOUNTER — Encounter: Payer: Self-pay | Admitting: Adult Health

## 2018-07-12 DIAGNOSIS — S32050D Wedge compression fracture of fifth lumbar vertebra, subsequent encounter for fracture with routine healing: Secondary | ICD-10-CM

## 2018-07-12 DIAGNOSIS — K219 Gastro-esophageal reflux disease without esophagitis: Secondary | ICD-10-CM

## 2018-07-12 DIAGNOSIS — I69354 Hemiplegia and hemiparesis following cerebral infarction affecting left non-dominant side: Secondary | ICD-10-CM | POA: Diagnosis not present

## 2018-07-12 DIAGNOSIS — I480 Paroxysmal atrial fibrillation: Secondary | ICD-10-CM

## 2018-07-12 DIAGNOSIS — G8929 Other chronic pain: Secondary | ICD-10-CM

## 2018-07-12 DIAGNOSIS — F325 Major depressive disorder, single episode, in full remission: Secondary | ICD-10-CM

## 2018-07-12 DIAGNOSIS — I6930 Unspecified sequelae of cerebral infarction: Secondary | ICD-10-CM

## 2018-07-12 DIAGNOSIS — J438 Other emphysema: Secondary | ICD-10-CM

## 2018-07-12 NOTE — Progress Notes (Signed)
Location:  [facility] Number: [place]:  SNF (31) Provider:  Durenda Age, NP  Patient Care Team: Juluis Pitch, MD as PCP - General (Family Medicine) Gerlene Fee, NP as Nurse Practitioner (Geriatric Medicine)  Extended Emergency Contact Information Primary Emergency Contact: [name] Address: [address]          [place], [place] [address] [name] of [place] Phone: [phone] Relation: Daughter Secondary Emergency Contact: Lingerfelt,Brad Address: [address] [place], [place] [address] [name] of [place] Phone: [phone] Relation: [name]  Code Status:  DNR  Goals of care: Advanced Directive information Advanced Directives 07/05/2018  Does Patient Have a Medical Advance Directive? Yes  Type of Advance Directive Out of facility DNR (pink MOST or yellow form)  Does patient want to make changes to medical advance directive? No - Patient declined  Would patient like information on creating a medical advance directive? -  Pre-existing out of facility DNR order (yellow form or pink MOST form) -     Chief Complaint  Patient presents with  . Medical Management of Chronic Issues    Routine [facility] visit    HPI:  Pt is a 85 y.o. female seen today for medical management of chronic diseases.  She is a long-term care resident of [facility] She has a PMH of AAA, COPD, CAD, essential hypertension, GERD, PAF, stroke, and osteoporosis. She was seen in the room today. She has left hemiplegia. She said that her pain on her right hip and back is 5/10. She said that her current pain regimen is helping control her pain. She smiles during the conversation. No reported SOB. She said that she used to be a smoker.   Past Medical History:  Diagnosis Date  . Adenoma of rectum   . Adrenal adenoma   . Anemia   . Anemia, unspecified   . Arthritis   . Ascending aortic  aneurysm ([place])    a. s/p repair 07/2014  . Atrophic vaginitis   . Benign neoplasm of colon, unspecified   . COPD (chronic obstructive pulmonary disease) ([place])   . Coronary artery disease, non-occlusive    a. LHC 06/2014 without evidence of obstructive disease  . Depression   . Diverticulosis   . Dysphagia   . Dysrhythmia   . Essential hypertension    benign  . Frequent UTI   . GERD (gastroesophageal reflux disease)   . Hemiplegia and hemiparesis    following cerebral infarction affecting left non-dominant side  . Hypertension   . Microscopic hematuria   . Nonrheumatic aortic valve insufficiency   . Osteoporosis    unspecified  . Panic attacks   . Paroxysmal atrial fibrillation (HCC)   . Pernicious anemia   . Renal cyst    CKD STAGE 3  . Stroke ([place]) 07/23/2014   a. post-operative setting in 07/2014 leading to left UE hemiapresis and left LE weakness  . Urge incontinence    Past Surgical History:  Procedure Laterality Date  . ABDOMINAL HYSTERECTOMY  1972  . AORTOILIAC BYPASS    . ASCENDING AORTIC ANEURYSM REPAIR  07/23/2014   07/2014  . BLEPHAROPLASTY    . CATARACT EXTRACTION  2005  . GASTROSTOMY W/ FEEDING TUBE    . INTRAMEDULLARY (IM) NAIL INTERTROCHANTERIC Left 02/26/2016   Procedure: INTRAMEDULLARY (IM) NAIL INTERTROCHANTRIC;  Surgeon: Earnestine Leys, MD;  Location: [facility] ORS;  Service: Orthopedics;  Laterality: Left;  . KYPHOPLASTY N/A 08/26/2016   Procedure: KYPHOPLASTY T12;  Surgeon: Hessie Knows, MD;  Location: [facility] ORS;  Service: Orthopedics;  Laterality: N/A;  . KYPHOPLASTY N/A 06/17/2018   Procedure: L5 KYPHOPLASTY;  Surgeon: Hessie Knows, MD;  Location: [facility] ORS;  Service: Orthopedics;  Laterality: N/A;  . LEFT HEART CATH  06/2014  . PR ASCEND AORTIC GRAFT INCL VALVE SUSPENSION  07/07/2014   Procedure: ASCENDING AORTA GRAFT, WITH CARDIOPULMONARY BYPASS, WITH OR WITHOUT VALVE SUSPENSION; Surgeon: Dwaine Deter, MD; Location: MAIN [facility]; Service: Cardiothoracic    . PR LAP, GASTROTOMY W/O TUBE CONSTR  08/08/2014   Procedure: LAPAROSCOPY, SURGICAL; GASTOSTOMY W/O CONSTRUCTION OF GASTRIC TUBE (EG, STAMM PROCEDURE)(SEPARATE PROCED); Surgeon: Fredrik Rigger, MD; Location: MAIN OR [facility]; Service: Gastrointestinal  . PR PLACE PERCUT GASTROSTOMY TUBE  08/08/2014   Procedure: UGI ENDO; W/DIRECTED PLCMT PERQ GASTROSTOMY TUBE; Surgeon: Fredrik Rigger, MD; Location: MAIN [facility]; Service: Gastrointestinal    Allergies  Allergen Reactions  . Iodinated Diagnostic Agents Itching and Swelling    Other reaction(s): Unknown  . Nitrofurantoin Diarrhea    Other reaction(s): Unknown  . Omnipaque [Iohexol]   . Solifenacin Other (See Comments)    indigestion  . Ciprofloxacin Other (See Comments) and Rash    Colitis  . Metronidazole Itching and Rash  . Sulfa Antibiotics Itching and Rash    Outpatient Encounter Medications as of 07/12/2018  Medication Sig  . acetaminophen (TYLENOL) 325 MG tablet Take 650 mg by mouth 4 (four) times daily.   Marland Kitchen albuterol (PROVENTIL HFA;VENTOLIN HFA) 108 (90 Base) MCG/ACT inhaler Inhale 2 puffs into the lungs every 4 (four) hours as needed for wheezing or shortness of breath.  Marland Kitchen alum & mag hydroxide-simeth (MAALOX PLUS) 400-400-40 MG/5ML suspension Take 30 mLs by mouth every 4 (four) hours as needed.  Marland Kitchen aspirin EC 81 MG tablet Take 81 mg by mouth daily.  Marland Kitchen BREO ELLIPTA 100-25 MCG/INH AEPB Inhale 1 puff into the lungs daily. Rinse mouth well after use  . carboxymethylcellulose (REFRESH TEARS) 0.5 % SOLN Place 2 drops into both eyes. 1 - 4 times daily as needed  . diclofenac sodium (VOLTAREN) 1 % GEL Apply 4 g topically t bilateral hips and knees every 8 hours as needed for pain  . diltiazem (CARDIZEM) 60 MG tablet Take 60 mg by mouth daily.  Marland Kitchen docusate sodium (COLACE) 100 MG capsule Take 1 capsule (100 mg total) by mouth 2 (two) times daily.  Marland Kitchen gabapentin (NEURONTIN) 300 MG capsule Take 300 mg by mouth every 12 (twelve)  hours.   Marland Kitchen ipratropium-albuterol (DUONEB) 0.5-2.5 (3) MG/3ML SOLN Take 3 mLs by nebulization 3 (three) times daily.   . Lidocaine 4 % PTCH Apply 1 patch daily to right Flank / sore ribs.  Remove after 12 hours  . magnesium hydroxide (MILK OF MAGNESIA) 400 MG/5ML suspension Take 30 mLs by mouth daily as needed for mild constipation.  . methocarbamol (ROBAXIN) 500 MG tablet Take 500 mg by mouth every 8 (eight) hours.   . montelukast (SINGULAIR) 10 MG tablet Take 10 mg by mouth at bedtime.   Marland Kitchen oxyCODONE (ROXICODONE) 5 MG immediate release tablet Take 1 tablet (5 mg total) by mouth every 4 (four) hours as needed for up to 84 doses for severe pain.  . OXYGEN Inhale 2 L into the lungs continuous as needed.  . pantoprazole (PROTONIX) 40 MG tablet Take 40 mg by mouth daily.  Vladimir Faster Glycol-Propyl Glycol (SYSTANE) 0.4-0.3 %  GEL ophthalmic gel Place 1 application into both eyes at bedtime. (2 drops ) for dry eyes (Ointments are on Backorder)   . polyethylene glycol (MIRALAX / GLYCOLAX) packet Take 17 g by mouth 2 (two) times daily as needed.  . potassium chloride (K-DUR,KLOR-CON) 10 MEQ tablet Take 10 mEq by mouth daily.   . predniSONE (DELTASONE) 5 MG tablet Take 5 mg by mouth daily with breakfast.   . senna-docusate (SENNA-PLUS) 8.6-50 MG tablet Take 2 tablets by mouth 2 (two) times daily.   . sertraline (ZOLOFT) 100 MG tablet Take 100 mg by mouth daily.   . sertraline (ZOLOFT) 50 MG tablet Take 50 mg by mouth daily.   . Skin Protectants, Misc. (MINERIN) CREA Apply liberal amount daily to arms and legs after the bath and PRN for skin integrity  . sodium phosphate Pediatric (FLEET) 3.5-9.5 GM/59ML enema Place 1 enema rectally once as needed for severe constipation. Take once every 3 days if constipation is not relieved by Milk of magnesia or Bisacodyl Suppository  . torsemide (DEMADEX) 10 MG tablet Take 10 mg by mouth daily.   Marland Kitchen UNABLE TO FIND Diet Type: NAS  . [DISCONTINUED] polyethylene glycol  (MIRALAX) packet Take 17 g by mouth 2 (two) times daily.   No facility-administered encounter medications on file as of 07/12/2018.     Review of Systems  GENERAL: No change in appetite, no fatigue, no weight changes, no fever, chills or weakness MOUTH and THROAT: Denies oral discomfort, gingival pain or bleeding RESPIRATORY: no cough, SOB, DOE, wheezing, hemoptysis CARDIAC: No chest pain, edema or palpitations GI: No abdominal pain, diarrhea, constipation, heart burn, nausea or vomiting GU: Denies dysuria, frequency, hematuria, or discharge NEUROLOGICAL: Denies dizziness, syncope, numbness, or headache PSYCHIATRIC: Denies feelings of depression or anxiety. No report of hallucinations, insomnia, paranoia, or agitation   Immunization History  Administered Date(s) Administered  . Influenza Inj Mdck Quad Pf 02/14/2016  . Influenza-Unspecified 02/02/2014, 03/01/2016, 01/27/2017, 02/21/2018  . PPD Test 08/10/2014, 08/24/2014, 02/20/2016, 02/02/2018  . Pneumococcal Conjugate-13 06/21/2018  . Pneumococcal-Unspecified 02/20/2016   Pertinent  Health Maintenance Due  Topic Date Due  . INFLUENZA VACCINE  Completed  . PNA vac Low Risk Adult  Completed  . DEXA SCAN  Discontinued    Vitals:   07/12/18 0844  BP: 140/70  Pulse: 72  Resp: 20  Temp: (!) 97.1 F (36.2 C)  TempSrc: Oral  SpO2: 96%  Weight: 131 lb 6.4 oz (59.6 kg)  Height: 5\' 1"  (1.549 m)   Body mass index is 24.83 kg/m.  Physical Exam  GENERAL APPEARANCE: Well nourished. In no acute distress. Normal body habitus SKIN:  Skin is warm and dry.  MOUTH and THROAT: Lips are without lesions. Oral mucosa is moist and without lesions. RESPIRATORY: Breathing is even & unlabored, BS CTAB CARDIAC: RRR, no murmur,no extra heart sounds, no edema GI: Abdomen soft, normal BS, no masses, no tenderness, NEUROLOGICAL: There is no tremor. Speech is clear. Left hemiplegic.   PSYCHIATRIC:  Affect and behavior are appropriate  Labs  reviewed: Recent Labs    11/03/17 0640 03/04/18 0640 06/11/18 1730  NA 140 141 141  K 3.9 4.9 4.4  CL 103 105 107  CO2 30 30 26   GLUCOSE 87 88 105*  BUN 29* 30* 30*  CREATININE 1.07* 1.27* 1.19*  CALCIUM 8.7* 8.5* 9.0  MG 2.2  --   --    Recent Labs    11/03/17 0640 06/11/18 1730  AST 22 18  ALT  13 12  ALKPHOS 76 70  BILITOT 0.5 0.7  PROT 5.6* 5.7*  ALBUMIN 3.5 3.7   Recent Labs    02/09/18 0530 03/04/18 0640 06/11/18 1730  WBC 6.5 6.6 4.4  NEUTROABS 3.7 3.7 2.8  HGB 11.0* 10.3* 11.0*  HCT 33.1* 32.5* 34.3*  MCV 94.0 96.4 91.2  PLT 165 178 198   Lab Results  Component Value Date   TSH 5.829 (H) 11/03/2017   Lab Results  Component Value Date   HGBA1C 5.9 (H) 02/25/2016   Lab Results  Component Value Date   CHOL 153 11/03/2017   HDL 83 11/03/2017   LDLCALC 59 11/03/2017   TRIG 53 11/03/2017   CHOLHDL 1.8 11/03/2017    Significant Diagnostic Results in last 30 days:  Dg Lumbar Spine 2-3 Views  Result Date: 06/17/2018 CLINICAL DATA:  Imaging provided for vertebroplasty. EXAM: LUMBAR SPINE - 2-3 VIEW; DG C-ARM 61-120 MIN COMPARISON:  Lumbar MRI, 06/10/2018. FINDINGS: Portable images of the spine no previous vertebroplasty cement at the L3 level. Subsequent images show the placement of submit the the L5 vertebra overlies the recent fracture. No evidence of a procedure complication on the images submitted. IMPRESSION: Portable fluoroscopic imaging provided for L5 vertebroplasty. Electronically Signed   By: Lajean Manes M.D.   On: 06/17/2018 13:27   Dg C-arm 1-60 Min  Result Date: 06/17/2018 CLINICAL DATA:  Imaging provided for vertebroplasty. EXAM: LUMBAR SPINE - 2-3 VIEW; DG C-ARM 61-120 MIN COMPARISON:  Lumbar MRI, 06/10/2018. FINDINGS: Portable images of the spine no previous vertebroplasty cement at the L3 level. Subsequent images show the placement of submit the the L5 vertebra overlies the recent fracture. No evidence of a procedure complication on the  images submitted. IMPRESSION: Portable fluoroscopic imaging provided for L5 vertebroplasty. Electronically Signed   By: Lajean Manes M.D.   On: 06/17/2018 13:27    Assessment/Plan  1. Chronic cerebrovascular accident (CVA) - stable, continue aspirin 81 mg 1 tab daily, torsemide 10 mg 1 tab daily, Cardizem 60 mg 1 tab daily  2. Closed wedge compression fracture of L5 vertebra with routine healing -S/P kyphoplasty on 06/17/2018, continue Robaxin 500 mg 1 tab every 8 hours for muscle spasm  3. Hemiplegia and hemiparesis following cerebral infarction affecting left non-dominant side ([place]) -Continue supportive care, turn to sides, fall precautions  4. Paroxysmal atrial fibrillation (HCC) -Rate controlled, continue Cardizem 60 mg 1 tab daily  5. Gastroesophageal reflux disease without esophagitis -Continue Protonix DR 40 mg 1 tab daily  6. Other chronic pain -Verbalized having 5/10 pain on her back and right hip, gabapentin 300 mg 1 capsule every 12 hours, oxycodone 5 mg 1 tab every 4 hours needed  7. Other emphysema ([place]) -No SOB nor wheezing, continue Brio Ellipta 100-25 mcg/dose 1 inhalation daily, ipratropium-albuterol PRN, montelukast 10 mg 1 tab at bedtime  8.  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  Labs/tests ordered:  None  Goals of care:   Long-term care.   Durenda Age, NP [facility] and Adult [facility] [phone] (Monday-Friday 8:00 a.m. - 5:00 p.m.) [phone] (after hours)

## 2018-07-19 ENCOUNTER — Encounter: Payer: Self-pay | Admitting: Adult Health

## 2018-07-19 ENCOUNTER — Non-Acute Institutional Stay (SKILLED_NURSING_FACILITY): Payer: Medicare Other | Admitting: Adult Health

## 2018-07-19 ENCOUNTER — Other Ambulatory Visit: Payer: Self-pay | Admitting: Adult Health

## 2018-07-19 DIAGNOSIS — G8929 Other chronic pain: Secondary | ICD-10-CM | POA: Diagnosis not present

## 2018-07-19 DIAGNOSIS — I129 Hypertensive chronic kidney disease with stage 1 through stage 4 chronic kidney disease, or unspecified chronic kidney disease: Secondary | ICD-10-CM

## 2018-07-19 DIAGNOSIS — S32050D Wedge compression fracture of fifth lumbar vertebra, subsequent encounter for fracture with routine healing: Secondary | ICD-10-CM | POA: Diagnosis not present

## 2018-07-19 MED ORDER — METHOCARBAMOL 500 MG PO TABS: 500.0000 mg | ORAL_TABLET | Freq: Three times a day (TID) | ORAL | 0 refills | Status: DC | PRN

## 2018-07-19 MED ORDER — OXYCODONE HCL 5 MG PO TABS: 5.0000 mg | ORAL_TABLET | Freq: Three times a day (TID) | ORAL | 0 refills | Status: DC | PRN

## 2018-07-19 NOTE — Progress Notes (Addendum)
Location:  [facility] Number: [place]:  SNF (31) Provider:  Durenda Age, NP  Patient Care Team: Juluis Pitch, MD as PCP - General (Family Medicine) Gerlene Fee, NP as Nurse Practitioner (Geriatric Medicine)  Extended Emergency Contact Information Primary Emergency Contact: [name] Address: [address]          [place], [place] [address] [name] of [place] Phone: [phone] Relation: Daughter Secondary Emergency Contact: Lingerfelt,Brad Address: [address] [place], [place] [address] [name] of [place] Phone: [phone] Relation: [name]  Code Status:  DNR  Goals of care: Advanced Directive information Advanced Directives 07/19/2018  Does Patient Have a Medical Advance Directive? Yes  Type of Advance Directive Out of facility DNR (pink MOST or yellow form)  Does patient want to make changes to medical advance directive? No - Patient declined  Would patient like information on creating a medical advance directive? -  Pre-existing out of facility DNR order (yellow form or pink MOST form) -     Chief Complaint  Patient presents with  . Acute Visit    Patient is seen for pain management.    HPI:  Ana Schultz is a 85 y.o. female seen today for pain management.  She is a long-term care resident of [facility]. She has a PMH of AAA, COPD, CAD, essential hypertension, GERD, PAF, stroke, and osteoporosis. She had a recent (06/17/18) kyphoplasty due to closed wedge compression fracture of L5 vertebra. She currently takes PRN Oxycodone and Tylenol 4X/day for pain. She verbalized that her pain level is 6/10. Noted that she takes the PRN Oxycodone once or twice a day. No noted somnolence. BPs were reviewed -127/45, 140/70, 154/70. She denies headache nor dizziness.   Past Medical History:  Diagnosis Date  . Adenoma of rectum   . Adrenal adenoma   . Anemia   . Anemia,  unspecified   . Arthritis   . Ascending aortic aneurysm ([place])    a. s/p repair 07/2014  . Atrophic vaginitis   . Benign neoplasm of colon, unspecified   . COPD (chronic obstructive pulmonary disease) ([place])   . Coronary artery disease, non-occlusive    a. LHC 06/2014 without evidence of obstructive disease  . Depression   . Diverticulosis   . Dysphagia   . Dysrhythmia   . Essential hypertension    benign  . Frequent UTI   . GERD (gastroesophageal reflux disease)   . Hemiplegia and hemiparesis    following cerebral infarction affecting left non-dominant side  . Hypertension   . Microscopic hematuria   . Nonrheumatic aortic valve insufficiency   . Osteoporosis    unspecified  . Panic attacks   . Paroxysmal atrial fibrillation (HCC)   . Pernicious anemia   . Renal cyst    CKD STAGE 3  . Stroke ([place]) 07/23/2014   a. post-operative setting in 07/2014 leading to left UE hemiapresis and left LE weakness  . Urge incontinence    Past Surgical History:  Procedure Laterality Date  . ABDOMINAL HYSTERECTOMY  1972  . AORTOILIAC BYPASS    . ASCENDING AORTIC ANEURYSM REPAIR  07/23/2014   07/2014  . BLEPHAROPLASTY    . CATARACT EXTRACTION  2005  . GASTROSTOMY W/ FEEDING TUBE    . INTRAMEDULLARY (IM) NAIL INTERTROCHANTERIC Left 02/26/2016   Procedure: INTRAMEDULLARY (IM) NAIL INTERTROCHANTRIC;  Surgeon: Earnestine Leys, MD;  Location: [facility] ORS;  Service: Orthopedics;  Laterality: Left;  . KYPHOPLASTY N/A 08/26/2016   Procedure: KYPHOPLASTY T12;  Surgeon: Hessie Knows, MD;  Location: [facility] ORS;  Service: Orthopedics;  Laterality: N/A;  . KYPHOPLASTY N/A 06/17/2018   Procedure: L5 KYPHOPLASTY;  Surgeon: Hessie Knows, MD;  Location: [facility] ORS;  Service: Orthopedics;  Laterality: N/A;  . LEFT HEART CATH  06/2014  . PR ASCEND AORTIC GRAFT INCL VALVE SUSPENSION  07/07/2014   Procedure: ASCENDING AORTA GRAFT, WITH CARDIOPULMONARY BYPASS, WITH OR WITHOUT VALVE SUSPENSION; Surgeon: Dwaine Deter, MD;  Location: MAIN OR [facility]; Service: Cardiothoracic  . PR LAP, GASTROTOMY W/O TUBE CONSTR  08/08/2014   Procedure: LAPAROSCOPY, SURGICAL; GASTOSTOMY W/O CONSTRUCTION OF GASTRIC TUBE (EG, STAMM PROCEDURE)(SEPARATE PROCED); Surgeon: Fredrik Rigger, MD; Location: [facility]; Service: Gastrointestinal  . PR PLACE PERCUT GASTROSTOMY TUBE  08/08/2014   Procedure: UGI ENDO; W/DIRECTED PLCMT PERQ GASTROSTOMY TUBE; Surgeon: Fredrik Rigger, MD; Location: MA[facility] OR [facility]; Service: Gastrointestinal    Allergies  Allergen Reactions  . Iodinated Diagnostic Agents Itching and Swelling    Other reaction(s): Unknown  . Nitrofurantoin Diarrhea    Other reaction(s): Unknown  . Omnipaque [Iohexol]   . Solifenacin Other (See Comments)    indigestion  . Ciprofloxacin Other (See Comments) and Rash    Colitis  . Metronidazole Itching and Rash  . Sulfa Antibiotics Itching and Rash    Outpatient Encounter Medications as of 07/19/2018  Medication Sig  . acetaminophen (TYLENOL) 325 MG tablet Take 650 mg by mouth 4 (four) times daily.   Marland Kitchen albuterol (PROVENTIL HFA;VENTOLIN HFA) 108 (90 Base) MCG/ACT inhaler Inhale 2 puffs into the lungs every 4 (four) hours as needed for wheezing or shortness of breath.  Marland Kitchen alum & mag hydroxide-simeth (MAALOX PLUS) 400-400-40 MG/5ML suspension Take 30 mLs by mouth every 4 (four) hours as needed.  Marland Kitchen aspirin EC 81 MG tablet Take 81 mg by mouth daily.  Marland Kitchen BREO ELLIPTA 100-25 MCG/INH AEPB Inhale 1 puff into the lungs daily. Rinse mouth well after use  . carboxymethylcellulose (REFRESH TEARS) 0.5 % SOLN Place 2 drops into both eyes. 1 - 4 times daily as needed  . diclofenac sodium (VOLTAREN) 1 % GEL Apply 4 g topically t bilateral hips and knees every 8 hours as needed for pain  . diltiazem (CARDIZEM) 60 MG tablet Take 60 mg by mouth daily.  Marland Kitchen docusate sodium (COLACE) 100 MG capsule Take 1 capsule (100 mg total) by mouth 2 (two) times daily.  Marland Kitchen gabapentin (NEURONTIN) 300 MG  capsule Take 300 mg by mouth every 12 (twelve) hours.   Marland Kitchen guaiFENesin (ROBITUSSIN) 100 MG/5ML liquid Take 200 mg by mouth every 4 (four) hours as needed for cough.  Marland Kitchen ipratropium-albuterol (DUONEB) 0.5-2.5 (3) MG/3ML SOLN Take 3 mLs by nebulization 3 (three) times daily.   . Lidocaine 4 % PTCH Apply 1 patch daily to right Flank / sore ribs.  Remove after 12 hours  . magnesium hydroxide (MILK OF MAGNESIA) 400 MG/5ML suspension Take 30 mLs by mouth daily as needed for mild constipation.  . montelukast (SINGULAIR) 10 MG tablet Take 10 mg by mouth at bedtime.   . OXYGEN Inhale 2 L into the lungs continuous as needed.  . pantoprazole (PROTONIX) 40 MG tablet Take 40 mg by mouth daily.  Vladimir Faster Glycol-Propyl Glycol (SYSTANE) 0.4-0.3 % GEL ophthalmic gel Place 1 application into both eyes at bedtime. (2 drops ) for dry eyes (Ointments are on Backorder)   . polyethylene glycol (MIRALAX / GLYCOLAX)  packet Take 17 g by mouth 2 (two) times daily as needed.  . potassium chloride (K-DUR,KLOR-CON) 10 MEQ tablet Take 10 mEq by mouth daily.   . predniSONE (DELTASONE) 5 MG tablet Take 5 mg by mouth daily with breakfast.   . senna-docusate (SENNA-PLUS) 8.6-50 MG tablet Take 2 tablets by mouth 2 (two) times daily.   . sertraline (ZOLOFT) 100 MG tablet Take 100 mg by mouth daily.   . sertraline (ZOLOFT) 50 MG tablet Take 50 mg by mouth daily.   . Skin Protectants, Misc. (MINERIN) CREA Apply liberal amount daily to arms and legs after the bath and PRN for skin integrity  . sodium phosphate Pediatric (FLEET) 3.5-9.5 GM/59ML enema Place 1 enema rectally once as needed for severe constipation. Take once every 3 days if constipation is not relieved by Milk of magnesia or Bisacodyl Suppository  . torsemide (DEMADEX) 10 MG tablet Take 10 mg by mouth daily.   Marland Kitchen UNABLE TO FIND Diet Type: NAS  . [DISCONTINUED] methocarbamol (ROBAXIN) 500 MG tablet Take 500 mg by mouth every 8 (eight) hours.   . [DISCONTINUED] oxyCODONE  (ROXICODONE) 5 MG immediate release tablet Take 1 tablet (5 mg total) by mouth every 4 (four) hours as needed for up to 84 doses for severe pain.  . methocarbamol (ROBAXIN) 500 MG tablet Take 1 tablet (500 mg total) by mouth every 8 (eight) hours as needed for muscle spasms.  Marland Kitchen oxyCODONE (ROXICODONE) 5 MG immediate release tablet Take 1 tablet (5 mg total) by mouth every 8 (eight) hours as needed for up to 14 days for severe pain.   No facility-administered encounter medications on file as of 07/19/2018.     Review of Systems  GENERAL: No change in appetite, no fatigue, no weight changes, no fever, chills or weakness MOUTH and THROAT: Denies oral discomfort, gingival pain or bleeding RESPIRATORY: no cough, SOB, DOE, wheezing, hemoptysis CARDIAC: No chest pain, edema or palpitations GI: No abdominal pain, diarrhea, constipation, heart burn, nausea or vomiting GU: Denies dysuria, frequency, hematuria, or discharge NEUROLOGICAL: Denies dizziness, syncope, numbness, or headache PSYCHIATRIC: Denies feelings of depression or anxiety. No report of hallucinations, insomnia, paranoia, or agitation   Immunization History  Administered Date(s) Administered  . Influenza Inj Mdck Quad Pf 02/14/2016  . Influenza-Unspecified 02/02/2014, 03/01/2016, 01/27/2017, 02/21/2018  . PPD Test 08/10/2014, 08/24/2014, 02/20/2016, 02/02/2018  . Pneumococcal Conjugate-13 06/21/2018  . Pneumococcal-Unspecified 02/20/2016   Pertinent  Health Maintenance Due  Topic Date Due  . INFLUENZA VACCINE  Completed  . PNA vac Low Risk Adult  Completed  . DEXA SCAN  Discontinued    Vitals:   07/19/18 0850  BP: 132/68  Pulse: 78  Resp: 20  Temp: 98.3 F (36.8 C)  TempSrc: Oral  SpO2: 94%  Weight: 133 lb 8 oz (60.6 kg)  Height: 5\' 1"  (1.549 m)   Body mass index is 25.22 kg/m.  Physical Exam  GENERAL APPEARANCE: Well nourished. In no acute distress. Normal body habitus SKIN:  Skin is warm and dry.  MOUTH and  THROAT: Lips are without lesions. Oral mucosa is moist and without lesions. RESPIRATORY: Breathing is even & unlabored, BS CTAB CARDIAC: RRR, no murmur,no extra heart sounds, no edema GI: Abdomen soft, normal BS, no masses, no tenderness NEUROLOGICAL: There is no tremor. Speech is clear. Unable to move LUE due to hemiplegia.Alert and oriented X 3.   PSYCHIATRIC: Affect and behavior are appropriate    Labs reviewed: Recent Labs    11/03/17 0640 03/04/18  0640 06/11/18 1730  NA 140 141 141  K 3.9 4.9 4.4  CL 103 105 107  CO2 30 30 26   GLUCOSE 87 88 105*  BUN 29* 30* 30*  CREATININE 1.07* 1.27* 1.19*  CALCIUM 8.7* 8.5* 9.0  MG 2.2  --   --    Recent Labs    11/03/17 0640 06/11/18 1730  AST 22 18  ALT 13 12  ALKPHOS 76 70  BILITOT 0.5 0.7  PROT 5.6* 5.7*  ALBUMIN 3.5 3.7   Recent Labs    02/09/18 0530 03/04/18 0640 06/11/18 1730  WBC 6.5 6.6 4.4  NEUTROABS 3.7 3.7 2.8  HGB 11.0* 10.3* 11.0*  HCT 33.1* 32.5* 34.3*  MCV 94.0 96.4 91.2  PLT 165 178 198   Lab Results  Component Value Date   TSH 5.829 (H) 11/03/2017   Lab Results  Component Value Date   HGBA1C 5.9 (H) 02/25/2016   Lab Results  Component Value Date   CHOL 153 11/03/2017   HDL 83 11/03/2017   LDLCALC 59 11/03/2017   TRIG 53 11/03/2017   CHOLHDL 1.8 11/03/2017    Assessment/Plan  1. Other chronic pain - well-controlled, will decrease Oxycodone 5 mg from every 4 hours PRN to every 8 hours PRN X 1 month, continue Acetaminophen 650 mg 4X/day and gabapentin 300 mg 1 capsule every 12 hours  2. Closed wedge compression fracture of L5 vertebra with routine healing S/P kyphoplasty on 06/17/18, will decrease Robaxin 500 mg 1 tab from  every 8 hours routinely to Q 8 hours PRN  3. Hypertensive chronic kidney disease w stg 1-4/unsp chr kdny - inconsistent BPs, noted to have 154/70, will check BP BID X 1 week, continue Cardizem 60 mg 1 tab daily and torsemide 10 mg 1 tab daily   Family/ staff  Communication: Discussed plan of care with resident.  Labs/tests ordered:  None  Goals of care:   Long-term care.   Durenda Age, NP [facility] [phone] (Monday-Friday 8:00 a.m. - 5:00 p.m.) [phone] (after hours)

## 2018-07-19 NOTE — Addendum Note (Signed)
Addended by: Durenda Age C on: 07/19/2018 04:46 PM   Modules accepted: Orders

## 2018-07-26 ENCOUNTER — Non-Acute Institutional Stay (SKILLED_NURSING_FACILITY): Payer: Medicare Other | Admitting: Adult Health

## 2018-07-26 ENCOUNTER — Encounter: Payer: Self-pay | Admitting: Adult Health

## 2018-07-26 DIAGNOSIS — R05 Cough: Secondary | ICD-10-CM

## 2018-07-26 DIAGNOSIS — J438 Other emphysema: Secondary | ICD-10-CM

## 2018-07-26 DIAGNOSIS — R053 Chronic cough: Secondary | ICD-10-CM

## 2018-07-26 NOTE — Progress Notes (Signed)
Location:  [facility] Number: [place]:  SNF (31) Provider:  Durenda Age, NP  Patient Care Team: Juluis Pitch, MD as PCP - General (Family Medicine) Gerlene Fee, NP as Nurse Practitioner (Geriatric Medicine)  Extended Emergency Contact Information Primary Emergency Contact: [name] Address: [address]          [place], [place] [address] [name] of [place] Phone: [phone] Relation: Daughter Secondary Emergency Contact: Lingerfelt,Brad Address: [address] [place], [place] [address] [name] of [place] Phone: [phone] Relation: [name]  Code Status:  DNR  Goals of care: Advanced Directive information Advanced Directives 07/19/2018  Does Patient Have a Medical Advance Directive? Yes  Type of Advance Directive Out of facility DNR (pink MOST or yellow form)  Does patient want to make changes to medical advance directive? No - Patient declined  Would patient like information on creating a medical advance directive? -  Pre-existing out of facility DNR order (yellow form or pink MOST form) -     Chief Complaint  Patient presents with   Acute Visit    Patient with complaints of a cough.    HPI:  Pt is a 85 y.o. female seen today for an acute visit secondary to complaints of a cough. She is a long-term care resident of [facility]. She has a PMH of AAA, COPD, CAD, essential HTN, GERD, PAF, stroke, and osteoporosis. She was seen today. She reported having cough and had green phlegm yesterday and whitish today. She denies having body aches nor sore throat. No reported fever nor SOB.   Past Medical History:  Diagnosis Date   Adenoma of rectum    Adrenal adenoma    Anemia    Anemia, unspecified    Arthritis    Ascending aortic aneurysm ([facility])    a. s/p repair 07/2014   Atrophic vaginitis    Benign neoplasm of colon, unspecified    COPD (chronic  obstructive pulmonary disease) (H[facility])    Coronary artery disease, non-occlusive    a. LHC 06/2014 without evidence of obstructive disease   Depression    Diverticulosis    Dysphagia    Dysrhythmia    Essential hypertension    benign   Frequent UTI    GERD (gastroesophageal reflux disease)    Hemiplegia and hemiparesis    following cerebral infarction affecting left non-dominant side   Hypertension    Microscopic hematuria    Nonrheumatic aortic valve insufficiency    Osteoporosis    unspecified   Panic attacks    Paroxysmal atrial fibrillation (HCC)    Pernicious anemia    Renal cyst    CKD STAGE 3   Stroke ([place]) 07/23/2014   a. post-operative setting in 07/2014 leading to left UE hemiapresis and left LE weakness   Urge incontinence    Past Surgical History:  Procedure Laterality Date   ABDOMINAL HYSTERECTOMY  1972   AORTOILIAC BYPASS     ASCENDING AORTIC ANEURYSM REPAIR  07/23/2014   07/2014   BLEPHAROPLASTY     CATARACT EXTRACTION  2005   GASTROSTOMY W/ FEEDING TUBE     INTRAMEDULLARY (IM) NAIL INTERTROCHANTERIC Left 02/26/2016   Procedure: INTRAMEDULLARY (IM) NAIL INTERTROCHANTRIC;  Surgeon: Earnestine Leys, MD;  Location: [facility] ORS;  Service: Orthopedics;  Laterality: Left;   KYPHOPLASTY N/A 08/26/2016   Procedure: KYPHOPLASTY T12;  Surgeon: Hessie Knows, MD;  Location: [facility]  ORS;  Service: Orthopedics;  Laterality: N/A;   KYPHOPLASTY N/A 06/17/2018   Procedure: L5 KYPHOPLASTY;  Surgeon: Hessie Knows, MD;  Location: [facility] ORS;  Service: Orthopedics;  Laterality: N/A;   LEFT HEART CATH  06/2014   PR ASCEND AORTIC GRAFT INCL VALVE SUSPENSION  07/07/2014   Procedure: ASCENDING AORTA GRAFT, WITH CARDIOPULMONARY BYPASS, WITH OR WITHOUT VALVE SUSPENSION; Surgeon: Dwaine Deter, MD; Location: MAIN OR [facility]; Service: Cardiothoracic   PR LAP, GASTROTOMY W/O TUBE CONSTR  08/08/2014   Procedure: LAPAROSCOPY, SURGICAL; GASTOSTOMY W/O CONSTRUCTION OF  GASTRIC TUBE (EG, STAMM PROCEDURE)(SEPARATE PROCED); Surgeon: Fredrik Rigger, MD; [facility]; Service: Gastrointestinal   PR PLACE PERCUT GASTROSTOMY TUBE  08/08/2014   Procedure: UGI ENDO; W/DIRECTED PLCMT PERQ GASTROSTOMY TUBE; Surgeon: Fredrik Rigger, MD; Location: MAIN OR [facility]; Service: Gastrointestinal    Allergies  Allergen Reactions   Iodinated Diagnostic Agents Itching and Swelling    Other reaction(s): Unknown   Nitrofurantoin Diarrhea    Other reaction(s): Unknown   Omnipaque [Iohexol]    Solifenacin Other (See Comments)    indigestion   Ciprofloxacin Other (See Comments) and Rash    Colitis   Metronidazole Itching and Rash   Sulfa Antibiotics Itching and Rash    Outpatient Encounter Medications as of 07/26/2018  Medication Sig   acetaminophen (TYLENOL) 325 MG tablet Take 650 mg by mouth 4 (four) times daily.    albuterol (PROVENTIL HFA;VENTOLIN HFA) 108 (90 Base) MCG/ACT inhaler Inhale 2 puffs into the lungs every 4 (four) hours as needed for wheezing or shortness of breath.   alum & mag hydroxide-simeth (MAALOX PLUS) 400-400-40 MG/5ML suspension Take 30 mLs by mouth every 4 (four) hours as needed.   aspirin EC 81 MG tablet Take 81 mg by mouth daily.   BREO ELLIPTA 100-25 MCG/INH AEPB Inhale 1 puff into the lungs daily. Rinse mouth well after use   carboxymethylcellulose (REFRESH TEARS) 0.5 % SOLN Place 2 drops into both eyes. 1 - 4 times daily as needed   diclofenac sodium (VOLTAREN) 1 % GEL Apply 4 g topically t bilateral hips and knees every 8 hours as needed for pain   diltiazem (CARDIZEM) 60 MG tablet Take 60 mg by mouth daily.   docusate sodium (COLACE) 100 MG capsule Take 1 capsule (100 mg total) by mouth 2 (two) times daily.   gabapentin (NEURONTIN) 300 MG capsule Take 300 mg by mouth every 12 (twelve) hours.    guaiFENesin (ROBITUSSIN) 100 MG/5ML liquid Take 200 mg by mouth every 4 (four) hours as needed for cough.     ipratropium-albuterol (DUONEB) 0.5-2.5 (3) MG/3ML SOLN Take 3 mLs by nebulization 3 (three) times daily.    Lidocaine 4 % PTCH Apply 1 patch daily to right Flank / sore ribs.  Remove after 12 hours   magnesium hydroxide (MILK OF MAGNESIA) 400 MG/5ML suspension Take 30 mLs by mouth daily as needed for mild constipation.   methocarbamol (ROBAXIN) 500 MG tablet Take 1 tablet (500 mg total) by mouth every 8 (eight) hours as needed for muscle spasms.   montelukast (SINGULAIR) 10 MG tablet Take 10 mg by mouth at bedtime.    oxyCODONE (ROXICODONE) 5 MG immediate release tablet Take 1 tablet (5 mg total) by mouth every 8 (eight) hours as needed for up to 14 days for severe pain.   OXYGEN Inhale 2 L into the lungs continuous as needed.   pantoprazole (PROTONIX) 40 MG tablet Take 40 mg by mouth daily.   Polyethyl Glycol-Propyl  Glycol (SYSTANE) 0.4-0.3 % GEL ophthalmic gel Place 1 application into both eyes at bedtime. (2 drops ) for dry eyes (Ointments are on Backorder)    polyethylene glycol (MIRALAX / GLYCOLAX) packet Take 17 g by mouth 2 (two) times daily as needed.   potassium chloride (K-DUR,KLOR-CON) 10 MEQ tablet Take 10 mEq by mouth daily.    predniSONE (DELTASONE) 5 MG tablet Take 5 mg by mouth daily with breakfast.    senna-docusate (SENNA-PLUS) 8.6-50 MG tablet Take 2 tablets by mouth 2 (two) times daily.    sertraline (ZOLOFT) 100 MG tablet Take 100 mg by mouth daily.    sertraline (ZOLOFT) 50 MG tablet Take 50 mg by mouth daily.    Skin Protectants, Misc. (MINERIN) CREA Apply liberal amount daily to arms and legs after the bath and PRN for skin integrity   sodium phosphate Pediatric (FLEET) 3.5-9.5 GM/59ML enema Place 1 enema rectally once as needed for severe constipation. Take once every 3 days if constipation is not relieved by Milk of magnesia or Bisacodyl Suppository   torsemide (DEMADEX) 10 MG tablet Take 10 mg by mouth daily.    UNABLE TO FIND Diet Type: NAS   No  facility-administered encounter medications on file as of 07/26/2018.     Review of Systems  GENERAL: No change in appetite, no fatigue, no weight changes, no fever, chills or weakness MOUTH and THROAT: Denies oral discomfort, gingival pain or bleeding, pain from teeth or hoarseness   RESPIRATORY: no SOB, DOE, wheezing, hemoptysis, +cough CARDIAC: No chest pain, edema or palpitations GI: No abdominal pain, diarrhea, constipation, heart burn, nausea or vomiting GU: Denies dysuria, frequency, hematuria, incontinence, or discharge NEUROLOGICAL: Denies dizziness, syncope, or headache PSYCHIATRIC: Denies feelings of depression or anxiety. No report of hallucinations, insomnia, paranoia, or agitation   Immunization History  Administered Date(s) Administered   Influenza Inj Mdck Quad Pf 02/14/2016   Influenza-Unspecified 02/02/2014, 03/01/2016, 01/27/2017, 02/21/2018   PPD Test 08/10/2014, 08/24/2014, 02/20/2016, 02/02/2018   Pneumococcal Conjugate-13 06/21/2018   Pneumococcal-Unspecified 02/20/2016   Pertinent  Health Maintenance Due  Topic Date Due   INFLUENZA VACCINE  Completed   PNA vac Low Risk Adult  Completed   DEXA SCAN  Discontinued    Vitals:   07/26/18 1137  BP: (!) 138/44  Pulse: 70  Resp: 15  Temp: 98.8 F (37.1 C)  TempSrc: Oral  SpO2: 97%  Weight: 128 lb (58.1 kg)  Height: 5\' 1"  (1.549 m)   Body mass index is 24.19 kg/m.  Physical Exam  GENERAL APPEARANCE: Well nourished. In no acute distress. Normal body habitus SKIN:  Skin is warm and dry.  MOUTH and THROAT: Lips are without lesions. Oral mucosa is moist and without lesions.  tenderness, no thyromegaly RESPIRATORY: Breathing is even & unlabored, BS CTAB CARDIAC: RRR, no murmur,no extra heart sounds, no edema GI: Abdomen soft, normal BS, no masses, no tenderness NEUROLOGICAL: There is no tremor. Speech is clear. Unable to move LUE due to hemiplegia.Alert and oriented X 3.  PSYCHIATRIC:  Affect and  behavior are appropriate  Labs reviewed: Recent Labs    11/03/17 0640 03/04/18 0640 06/11/18 1730  NA 140 141 141  K 3.9 4.9 4.4  CL 103 105 107  CO2 30 30 26   GLUCOSE 87 88 105*  BUN 29* 30* 30*  CREATININE 1.07* 1.27* 1.19*  CALCIUM 8.7* 8.5* 9.0  MG 2.2  --   --    Recent Labs    11/03/17 0640 06/11/18 1730  AST 22 18  ALT 13 12  ALKPHOS 76 70  BILITOT 0.5 0.7  PROT 5.6* 5.7*  ALBUMIN 3.5 3.7   Recent Labs    02/09/18 0530 03/04/18 0640 06/11/18 1730  WBC 6.5 6.6 4.4  NEUTROABS 3.7 3.7 2.8  HGB 11.0* 10.3* 11.0*  HCT 33.1* 32.5* 34.3*  MCV 94.0 96.4 91.2  PLT 165 178 198   Lab Results  Component Value Date   TSH 5.829 (H) 11/03/2017   Lab Results  Component Value Date   HGBA1C 5.9 (H) 02/25/2016   Lab Results  Component Value Date   CHOL 153 11/03/2017   HDL 83 11/03/2017   LDLCALC 59 11/03/2017   TRIG 53 11/03/2017   CHOLHDL 1.8 11/03/2017    Assessment/Plan  1. Chronic cough - will start Guaifenesin 100 mg/88ml 10 ml TID X 1 week  2. Other emphysema ([place]) -No SOB nor wheezing, continue Brio Ellipta 100-25 mcg/dose 1 inhalation daily, ipratropium-albuterol 0.5 mg - 3 mg (2.5 mg base)/3 mL via nebulization 3 times a day, montelukast 10 mg 1 tab at bedtime and prednisone 5 mg 1 tab daily   Family/ staff Communication: Discussed plan of care with resident.  Labs/tests ordered:  None  Goals of care:   Long-term care.   Durenda Age, NP [facility] [phone] (Monday-Friday 8:00 a.m. - 5:00 p.m.) [phone] (after hours)

## 2018-08-02 ENCOUNTER — Encounter: Payer: Self-pay | Admitting: Adult Health

## 2018-08-02 ENCOUNTER — Non-Acute Institutional Stay (SKILLED_NURSING_FACILITY): Payer: Medicare Other | Admitting: Adult Health

## 2018-08-02 DIAGNOSIS — M545 Low back pain, unspecified: Secondary | ICD-10-CM

## 2018-08-02 DIAGNOSIS — K5901 Slow transit constipation: Secondary | ICD-10-CM

## 2018-08-02 DIAGNOSIS — G8929 Other chronic pain: Secondary | ICD-10-CM

## 2018-08-02 DIAGNOSIS — S32050D Wedge compression fracture of fifth lumbar vertebra, subsequent encounter for fracture with routine healing: Secondary | ICD-10-CM | POA: Diagnosis not present

## 2018-08-02 DIAGNOSIS — R05 Cough: Secondary | ICD-10-CM

## 2018-08-02 DIAGNOSIS — R059 Cough, unspecified: Secondary | ICD-10-CM

## 2018-08-02 MED ORDER — ACETAMINOPHEN 325 MG PO TABS: 650.0000 mg | ORAL_TABLET | Freq: Two times a day (BID) | ORAL | Status: AC

## 2018-08-02 MED ORDER — OXYCODONE HCL 5 MG PO TABS: 5.0000 mg | ORAL_TABLET | Freq: Two times a day (BID) | ORAL | 0 refills | Status: AC | PRN

## 2018-08-02 NOTE — Progress Notes (Signed)
Location:  [facility] Number: [place] of Service:  SNF [phone] Provider:  Durenda Age, NP  Patient Care Team: Juluis Pitch, MD as PCP - General (Family Medicine) Gerlene Fee, NP as Nurse Practitioner (Geriatric Medicine)  Extended Emergency Contact Information Primary Emergency Contact: [name] Address: [address]          [place], [place] [address] [name] of [place] Phone: [phone] Relation: Daughter Secondary Emergency Contact: Lingerfelt,Brad Address: [address] [place], [place] [address] [name] of [place] Phone: [phone] Relation: [name]  Code Status:  DNR  Goals of care: Advanced Directive information Advanced Directives 07/26/2018  Does Patient Have a Medical Advance Directive? Yes  Type of Advance Directive Out of facility DNR (pink MOST or yellow form)  Does patient want to make changes to medical advance directive? No - Patient declined  Would patient like information on creating a medical advance directive? -  Pre-existing out of facility DNR order (yellow form or pink MOST form) -     Chief Complaint  Patient presents with  . Acute Visit    Pain Management    HPI:  Pt is a 85 y.o. female seen today for pain management. She has chronic pain on her lower back. She had a recent kyphoplasty on 06/17/18 due to compression fracture of L5. She verbalized that she has 3/10 pain. Review of the e-MAR showed that for the past 2 weeks, she had used her PRN Oxycodone 5X. She complained of having constipation today. She was recently started on Guaifenesin for cough. She verbalized to continue having cough with whitish phlegm. She denies having fever nor SOB.   Past Medical History:  Diagnosis Date  . Adenoma of rectum   . Adrenal adenoma   . Anemia   . Anemia, unspecified   . Arthritis   . Ascending aortic aneurysm ([facility])    a. s/p repair 07/2014  . Atrophic  vaginitis   . Benign neoplasm of colon, unspecified   . COPD (chronic obstructive pulmonary disease) ([facility])   . Coronary artery disease, non-occlusive    a. LHC 06/2014 without evidence of obstructive disease  . Depression   . Diverticulosis   . Dysphagia   . Dysrhythmia   . Essential hypertension    benign  . Frequent UTI   . GERD (gastroesophageal reflux disease)   . Hemiplegia and hemiparesis    following cerebral infarction affecting left non-dominant side  . Hypertension   . Microscopic hematuria   . Nonrheumatic aortic valve insufficiency   . Osteoporosis    unspecified  . Panic attacks   . Paroxysmal atrial fibrillation ([facility])   . Pernicious anemia   . Renal cyst    CKD STAGE 3  . Stroke ([facility]) 07/23/2014   a. post-operative setting in 07/2014 leading to left UE hemiapresis and left LE weakness  . Urge incontinence    Past Surgical History:  Procedure Laterality Date  . ABDOMINAL HYSTERECTOMY  1972  . AORTOILIAC BYPASS    . ASCENDING AORTIC ANEURYSM REPAIR  07/23/2014   07/2014  . BLEPHAROPLASTY    . CATARACT EXTRACTION  2005  . GASTROSTOMY W/ FEEDING TUBE    . INTRAMEDULLARY (IM) NAIL INTERTROCHANTERIC Left 02/26/2016   Procedure: INTRAMEDULLARY (IM) NAIL INTERTROCHANTRIC;  Surgeon: Earnestine Leys, MD;  Location: [facility] ORS;  Service: Orthopedics;  Laterality: Left;  . KYPHOPLASTY N/A 08/26/2016  Procedure: KYPHOPLASTY T12;  Surgeon: Hessie Knows, MD;  Location: [facility] ORS;  Service: Orthopedics;  Laterality: N/A;  . KYPHOPLASTY N/A 06/17/2018   Procedure: L5 KYPHOPLASTY;  Surgeon: Hessie Knows, MD;  Location: [facility] ORS;  Service: Orthopedics;  Laterality: N/A;  . LEFT HEART CATH  06/2014  . PR ASCEND AORTIC GRAFT INCL VALVE SUSPENSION  07/07/2014   Procedure: ASCENDING AORTA GRAFT, WITH CARDIOPULMONARY BYPASS, WITH OR WITHOUT VALVE SUSPENSION; Surgeon: Dwaine Deter, MD; Location: MAIN OR [facility]; Service: Cardiothoracic  . PR LAP, GASTROTOMY W/O TUBE CONSTR  08/08/2014    Procedure: LAPAROSCOPY, SURGICAL; GASTOSTOMY W/O CONSTRUCTION OF GASTRIC TUBE (EG, STAMM PROCEDURE)(SEPARATE PROCED); Surgeon: Fredrik Rigger, MD; Location: MAIN OR [facility]; Service: Gastrointestinal  . PR PLACE PERCUT GASTROSTOMY TUBE  08/08/2014   Procedure: UGI ENDO; W/DIRECTED PLCMT PERQ GASTROSTOMY TUBE; Surgeon: Fredrik Rigger, MD; Location: MAIN OR [facility]; Service: Gastrointestinal    Allergies  Allergen Reactions  . Iodinated Diagnostic Agents Itching and Swelling    Other reaction(s): Unknown  . Nitrofurantoin Diarrhea    Other reaction(s): Unknown  . Omnipaque [Iohexol]   . Solifenacin Other (See Comments)    indigestion  . Ciprofloxacin Other (See Comments) and Rash    Colitis  . Metronidazole Itching and Rash  . Sulfa Antibiotics Itching and Rash    Outpatient Encounter Medications as of 08/02/2018  Medication Sig  . acetaminophen (TYLENOL) 325 MG tablet Take 2 tablets (650 mg total) by mouth 2 (two) times daily.  Marland Kitchen albuterol (PROVENTIL HFA;VENTOLIN HFA) 108 (90 Base) MCG/ACT inhaler Inhale 2 puffs into the lungs every 4 (four) hours as needed for wheezing or shortness of breath.  Marland Kitchen alum & mag hydroxide-simeth (MAALOX PLUS) 400-400-40 MG/5ML suspension Take 30 mLs by mouth every 4 (four) hours as needed.  Marland Kitchen aspirin EC 81 MG tablet Take 81 mg by mouth daily.  Marland Kitchen BREO ELLIPTA 100-25 MCG/INH AEPB Inhale 1 puff into the lungs daily. Rinse mouth well after use  . carboxymethylcellulose (REFRESH TEARS) 0.5 % SOLN Place 2 drops into both eyes. 1 - 4 times daily as needed  . diclofenac sodium (VOLTAREN) 1 % GEL Apply 4 g topically t bilateral hips and knees every 8 hours as needed for pain  . diltiazem (CARDIZEM) 60 MG tablet Take 60 mg by mouth daily.  Marland Kitchen docusate sodium (COLACE) 100 MG capsule Take 1 capsule (100 mg total) by mouth 2 (two) times daily.  Marland Kitchen gabapentin (NEURONTIN) 300 MG capsule Take 300 mg by mouth every 12 (twelve) hours.   Marland Kitchen guaiFENesin (ROBITUSSIN)  100 MG/5ML liquid Take 200 mg by mouth every 4 (four) hours as needed for cough.  Marland Kitchen ipratropium-albuterol (DUONEB) 0.5-2.5 (3) MG/3ML SOLN Take 3 mLs by nebulization 3 (three) times daily.   . Lidocaine 4 % PTCH Apply 1 patch daily to right Flank / sore ribs.  Remove after 12 hours  . magnesium hydroxide (MILK OF MAGNESIA) 400 MG/5ML suspension Take 30 mLs by mouth daily as needed for mild constipation.  . methocarbamol (ROBAXIN) 500 MG tablet Take 1 tablet (500 mg total) by mouth every 8 (eight) hours as needed for muscle spasms.  . montelukast (SINGULAIR) 10 MG tablet Take 10 mg by mouth at bedtime.   Marland Kitchen oxyCODONE (ROXICODONE) 5 MG immediate release tablet Take 1 tablet (5 mg total) by mouth every 12 (twelve) hours as needed for moderate pain or severe pain.  . OXYGEN Inhale 2 L into the lungs continuous as needed.  . pantoprazole (PROTONIX) 40 MG tablet  Take 40 mg by mouth daily.  Vladimir Faster Glycol-Propyl Glycol (SYSTANE) 0.4-0.3 % GEL ophthalmic gel Place 1 application into both eyes at bedtime. (2 drops ) for dry eyes (Ointments are on Backorder)   . polyethylene glycol (MIRALAX / GLYCOLAX) packet Take 17 g by mouth 2 (two) times daily as needed.  . potassium chloride (K-DUR,KLOR-CON) 10 MEQ tablet Take 10 mEq by mouth daily.   . predniSONE (DELTASONE) 5 MG tablet Take 5 mg by mouth daily with breakfast.   . senna-docusate (SENNA-PLUS) 8.6-50 MG tablet Take 2 tablets by mouth 2 (two) times daily.   . sertraline (ZOLOFT) 100 MG tablet Take 100 mg by mouth daily.   . sertraline (ZOLOFT) 50 MG tablet Take 50 mg by mouth daily.   . Skin Protectants, Misc. (MINERIN) CREA Apply liberal amount daily to arms and legs after the bath and PRN for skin integrity  . sodium phosphate Pediatric (FLEET) 3.5-9.5 GM/59ML enema Place 1 enema rectally once as needed for severe constipation. Take once every 3 days if constipation is not relieved by Milk of magnesia or Bisacodyl Suppository  . torsemide (DEMADEX)  10 MG tablet Take 10 mg by mouth daily.   Marland Kitchen UNABLE TO FIND Diet Type: NAS  . [DISCONTINUED] acetaminophen (TYLENOL) 325 MG tablet Take 650 mg by mouth 4 (four) times daily.   . [DISCONTINUED] oxyCODONE (ROXICODONE) 5 MG immediate release tablet Take 1 tablet (5 mg total) by mouth every 8 (eight) hours as needed for up to 14 days for severe pain.   No facility-administered encounter medications on file as of 08/02/2018.     Review of Systems  GENERAL: No change in appetite, no fatigue, no weight changes, no fever, chills or weakness MOUTH and THROAT: Denies oral discomfort, gingival pain or bleeding RESPIRATORY: +cough CARDIAC: No chest pain, edema or palpitations GI: +constipation NEUROLOGICAL: Denies dizziness, syncope, numbness, or headache PSYCHIATRIC: Denies feelings of depression or anxiety. No report of hallucinations, insomnia, paranoia, or agitation   Immunization History  Administered Date(s) Administered  . Influenza Inj Mdck Quad Pf 02/14/2016  . Influenza-Unspecified 02/02/2014, 03/01/2016, 01/27/2017, 02/21/2018  . PPD Test 08/10/2014, 08/24/2014, 02/20/2016, 02/02/2018  . Pneumococcal Conjugate-13 06/21/2018  . Pneumococcal-Unspecified 02/20/2016   Pertinent  Health Maintenance Due  Topic Date Due  . INFLUENZA VACCINE  Completed  . PNA vac Low Risk Adult  Completed  . DEXA SCAN  Discontinued     Vitals:   08/02/18 1542  BP: (!) 136/59  Pulse: 82  Resp: 18  Temp: (!) 97.3 F (36.3 C)  SpO2: 98%  Weight: 131 lb 6.4 oz (59.6 kg)  Height: 5\' 1"  (1.549 m)   Body mass index is 24.83 kg/m.  Physical Exam  GENERAL APPEARANCE: Well nourished. In no acute distress. Normal body habitus SKIN:  Skin is warm and dry.  MOUTH and THROAT: Lips are without lesions. Oral mucosa is moist and without lesions. Tongue is normal in shape, size, and color and without lesions RESPIRATORY: Breathing is even & unlabored, BS CTAB CARDIAC: RRR, no murmur,no extra heart sounds,  no edema GI: Abdomen soft, normal BS, no masses, no tenderness NEUROLOGICAL: There is no tremor. Speech is clear. Alert X 3. Not able to move Left arm and weak LLE, able to move RUE and BLE PSYCHIATRIC:  Affect and behavior are appropriate   Labs reviewed: Recent Labs    11/03/17 0640 03/04/18 0640 06/11/18 1730  NA 140 141 141  K 3.9 4.9 4.4  CL 103 105  107  CO2 30 30 26   GLUCOSE 87 88 105*  BUN 29* 30* 30*  CREATININE 1.07* 1.27* 1.19*  CALCIUM 8.7* 8.5* 9.0  MG 2.2  --   --    Recent Labs    11/03/17 0640 06/11/18 1730  AST 22 18  ALT 13 12  ALKPHOS 76 70  BILITOT 0.5 0.7  PROT 5.6* 5.7*  ALBUMIN 3.5 3.7   Recent Labs    02/09/18 0530 03/04/18 0640 06/11/18 1730  WBC 6.5 6.6 4.4  NEUTROABS 3.7 3.7 2.8  HGB 11.0* 10.3* 11.0*  HCT 33.1* 32.5* 34.3*  MCV 94.0 96.4 91.2  PLT 165 178 198   Lab Results  Component Value Date   TSH 5.829 (H) 11/03/2017   Lab Results  Component Value Date   HGBA1C 5.9 (H) 02/25/2016   Lab Results  Component Value Date   CHOL 153 11/03/2017   HDL 83 11/03/2017   LDLCALC 59 11/03/2017   TRIG 53 11/03/2017   CHOLHDL 1.8 11/03/2017     Assessment/Plan  1. Chronic bilateral low back pain without sciatica - verbalized having 3/10 pain on lower back, will decrease Oxycodone 5 mg from Q 8 hours PRN to Q 12 hours PRN, will decrease  acetaminophen 325 mg 2 tabs= 650 mg from QID to BID Robaxin 500 mg 1 tab every 8 hours and gabapentin 300 mg 1 capsule every 12 hours  2. Closed wedge compression fracture of L5 vertebra with routine healing -S/P kyphoplasty on 06/17/2018, stable, will continue Robaxin 500 mg 1 tab every 8 hours as needed, PT for therapeutic strengthening exercises  3. Slow transit constipation -Continue MiraLAX 17 g twice a day as needed and Senna-Plus 8.6-50 mg 2 tabs BID with and Colace 100 mg 1 capsule BID  4. Cough  -   will continue guaifenesin 200 mg 3 times a day till 08/03/2018   Family/ staff  Communication: Discussed plan of care with resident.  Labs/tests ordered:  None  Goals of care:   Long-term care  Durenda Age, NP [facility] [phone] (Monday-Friday 8:00 a.m. - 5:00 p.m.) [phone] (after hours)

## 2018-08-04 ENCOUNTER — Encounter
Admission: RE | Admit: 2018-08-04 | Discharge: 2018-08-04 | Disposition: A | Discharge status: Home / Self Care | Payer: Medicare Other | Source: Ambulatory Visit | Attending: Internal Medicine | Admitting: Internal Medicine

## 2018-08-05 IMAGING — CR DG PELVIS 1-2V
1 series · 2 of 2 positions shown · non-contrast
Comparison: None.

CLINICAL DATA: Fall 1 week ago, left-sided weakness

EXAM:
PELVIS - 1-2 VIEW

[Series 1: dg pelvis 1-2 views · 0.14mm/px · 2 of 2 slices shown]
[im 1/2]
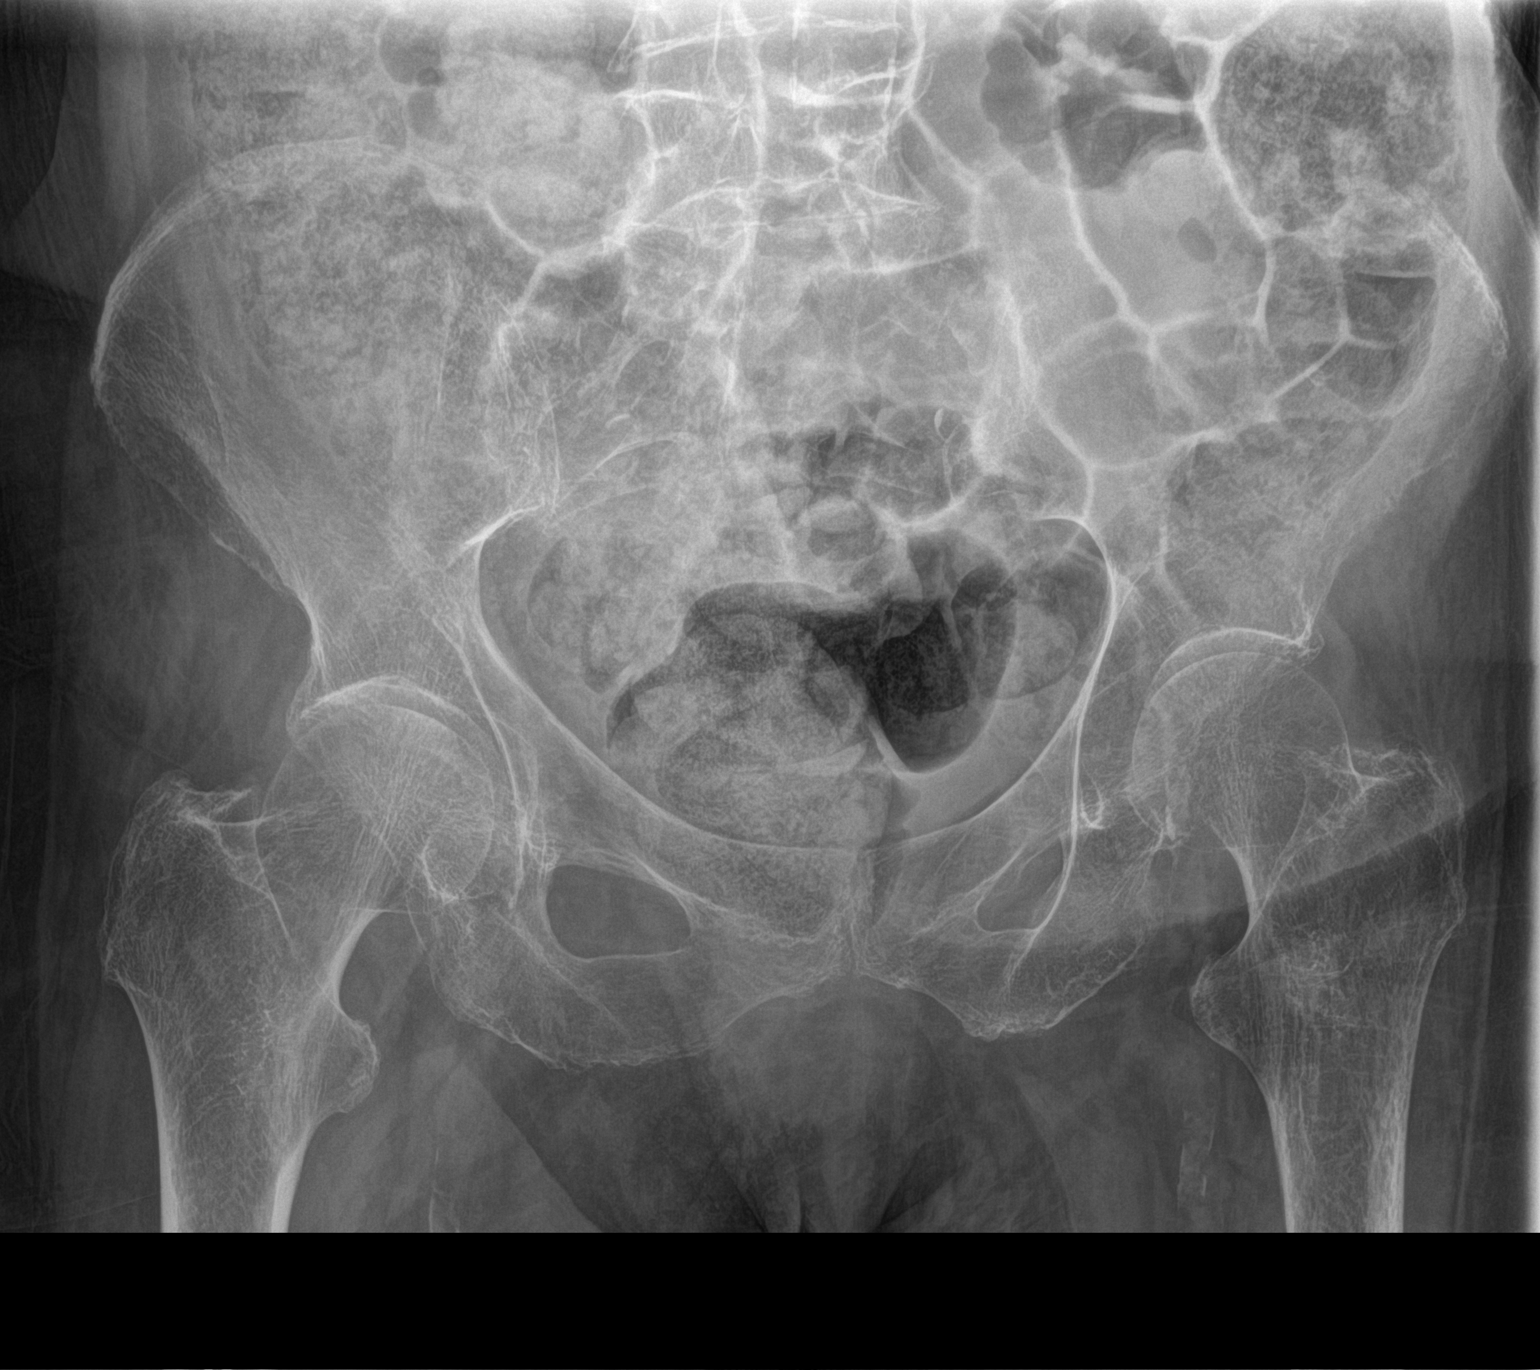
[im 2/2]
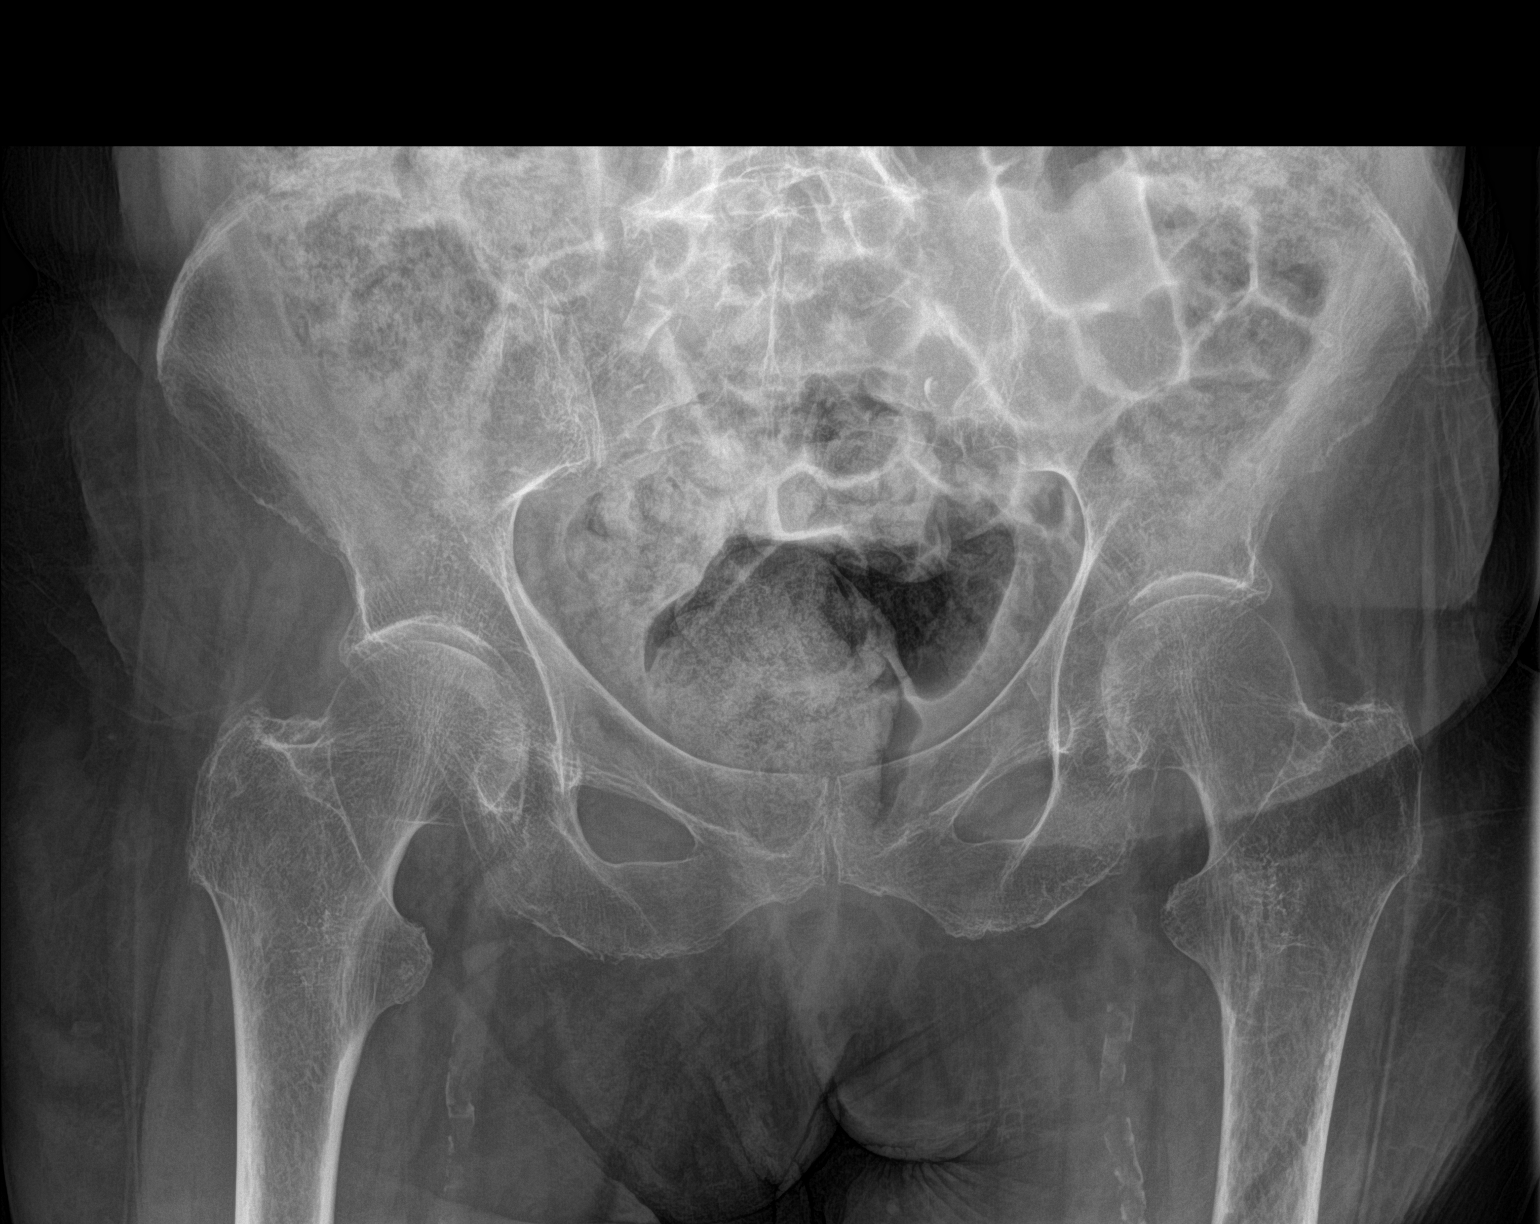

[2 of 2 positions shown; findings below may reference images not displayed]

FINDINGS: Bilateral hips are intact.

Bilateral hip joint spaces are mildly narrowed but symmetric.

Visualized bony pelvis appears intact.

Suspected mild loss of height at L5. Correlate with dedicated lumbar
spine radiographs.
IMPRESSION: Suspected mild loss of height of the L5 vertebral body. Correlate
with dedicated lumbar spine radiographs.

Otherwise, no fracture is seen.

## 2018-08-05 IMAGING — CR DG LUMBAR SPINE COMPLETE 4+V
1 series · 5 of 5 positions shown · non-contrast
Comparison: 01/07/2016.

CLINICAL DATA: Followup L2 compression deformity seen on
01/07/2016. The patient had an interval fall 1 week ago.

EXAM:
LUMBAR SPINE - COMPLETE 4+ VIEW

[Series 1: dg lumbar spine complete 4 +v · 0.14mm/px · 5 of 5 slices shown]
[im 1/5]
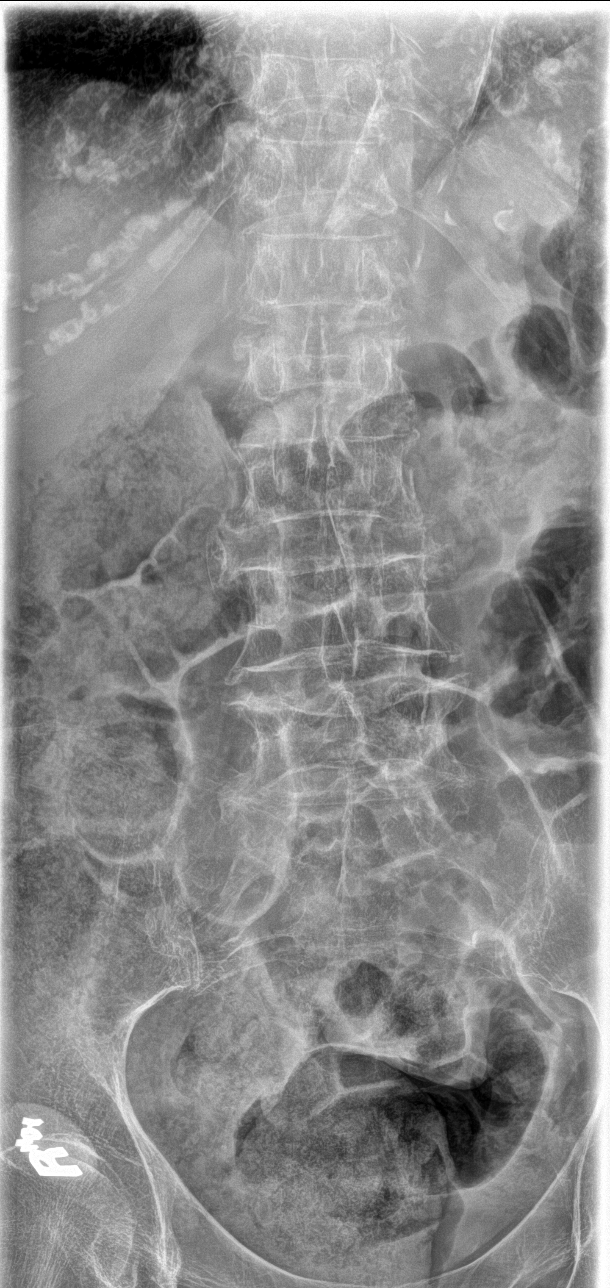
[im 2/5]
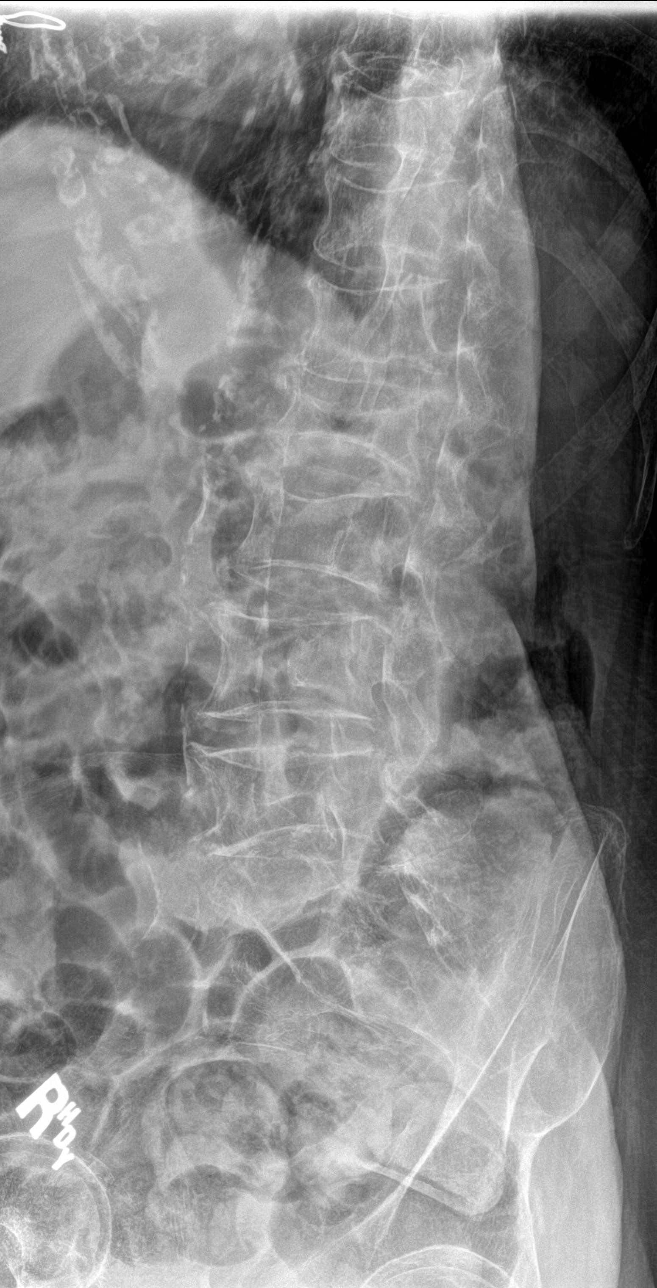
[im 3/5]
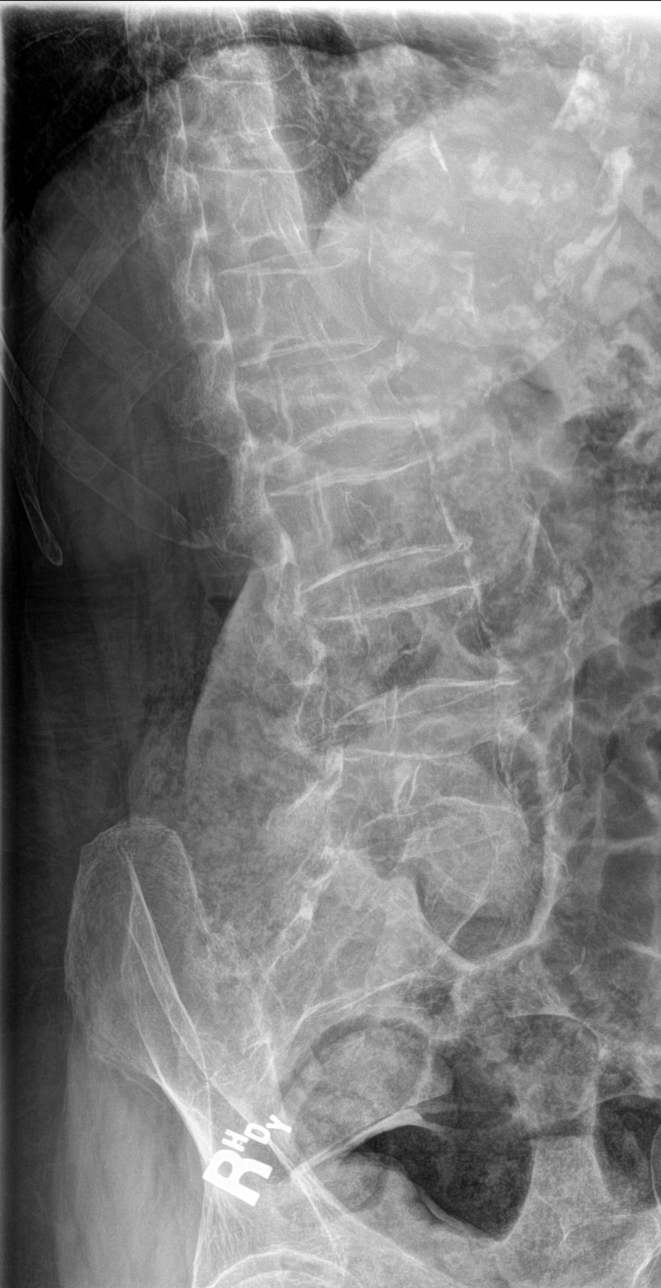
[im 4/5]
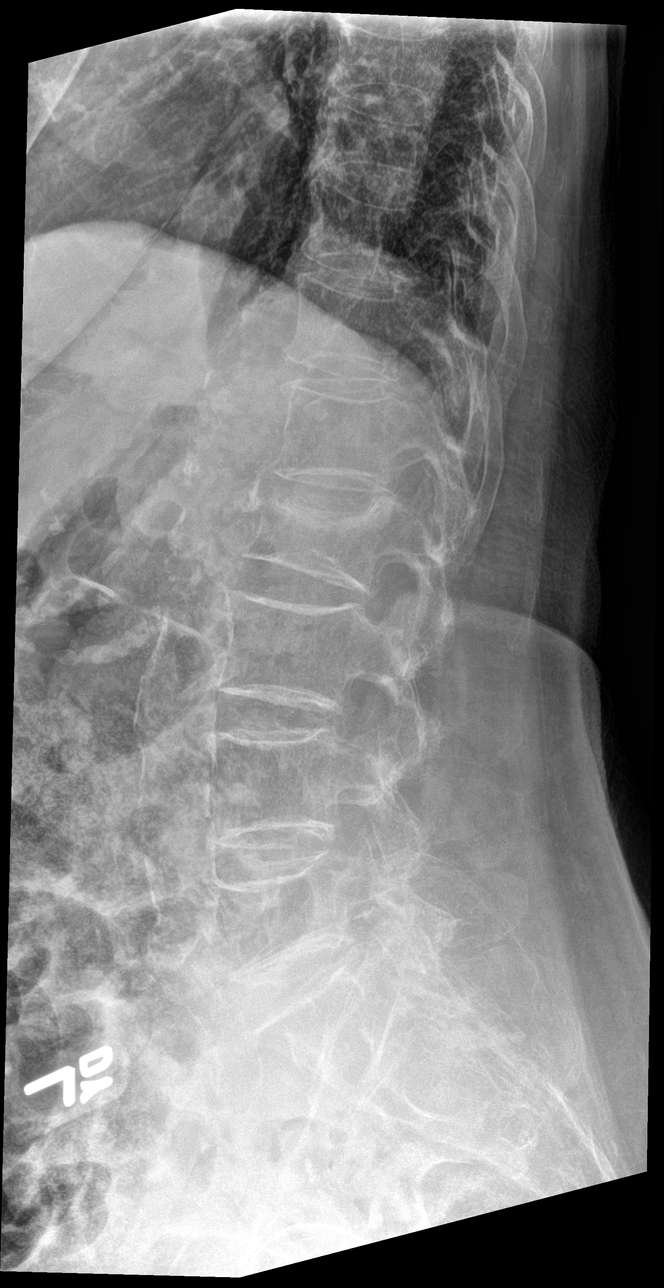
[im 5/5]
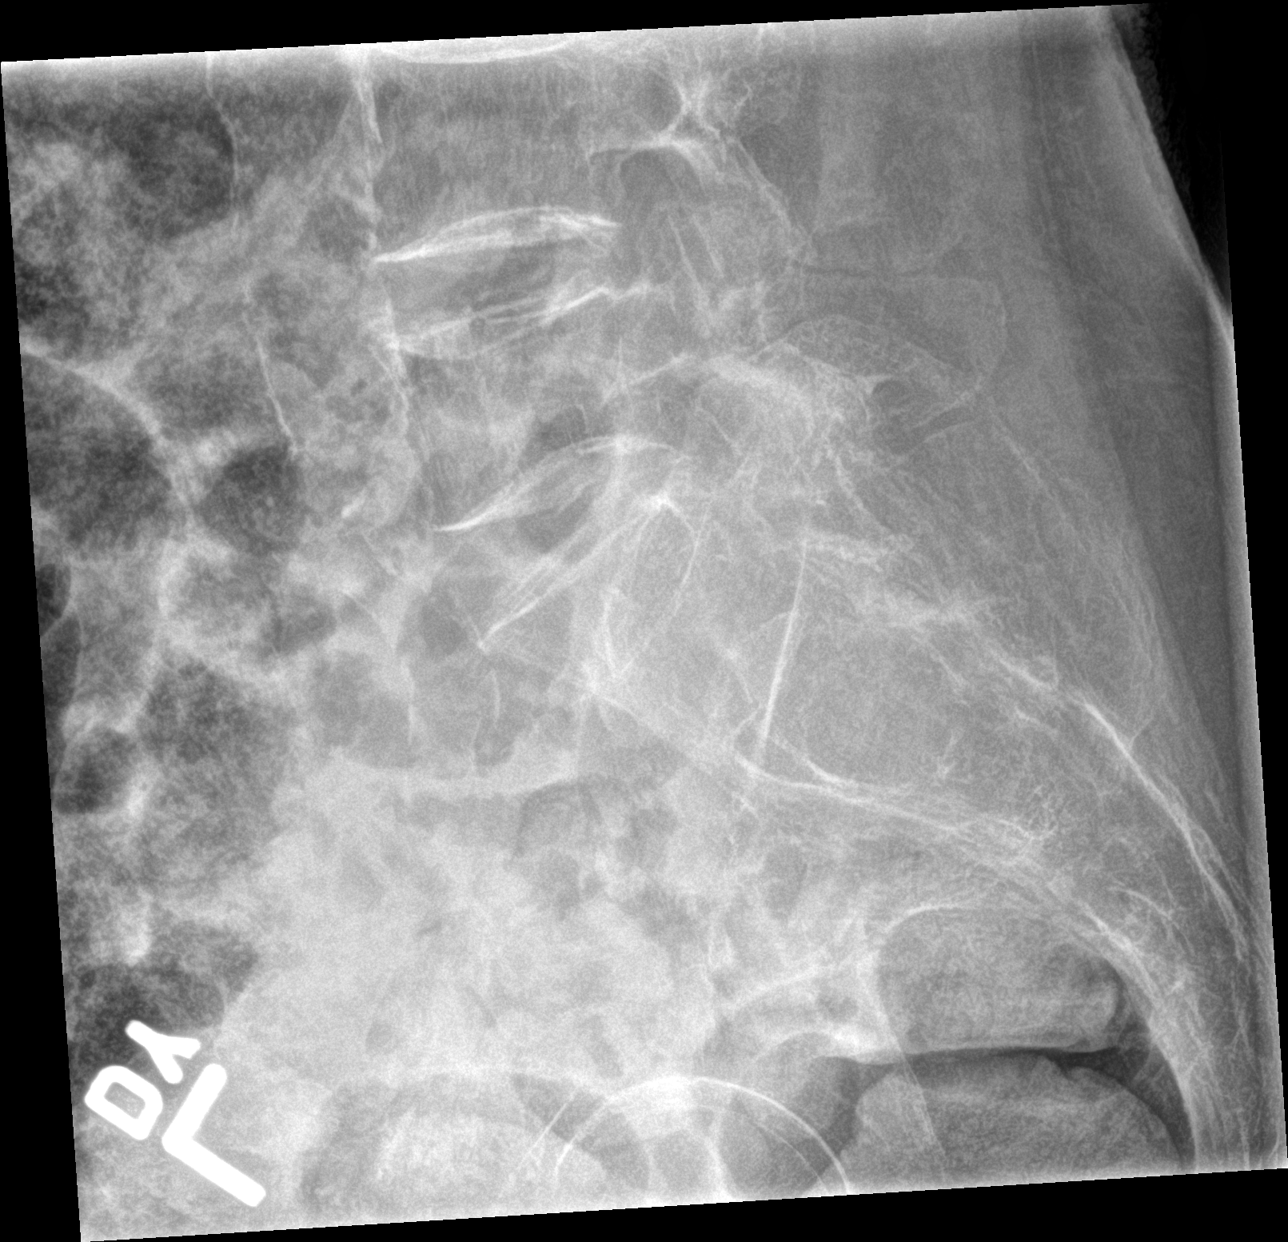

[5 of 5 positions shown; findings below may reference images not displayed]

FINDINGS: Increased compression of the superior endplate of the L2 vertebral
body with interval sclerosis. The degree of compression is currently
approximately 35%. No bony retropulsion seen. No other fractures and
no subluxations. Diffuse osteopenia. Extensive aortic
calcifications.
IMPRESSION: 1. Progressive L2 superior endplate compression fracture without
bony retropulsion.
2. Extensive aortic atherosclerosis.

## 2018-08-05 IMAGING — CR DG KNEE COMPLETE 4+V*L*
1 series · 5 of 5 positions shown · non-contrast
Comparison: None.

CLINICAL DATA: Left knee pain, tenderness and bruising following a
fall 1 week ago.

EXAM:
LEFT KNEE - COMPLETE 4+ VIEW

[Series 1: dg knee complete 4 views left · 0.14mm/px · 5 of 5 slices shown]
[im 1/5]
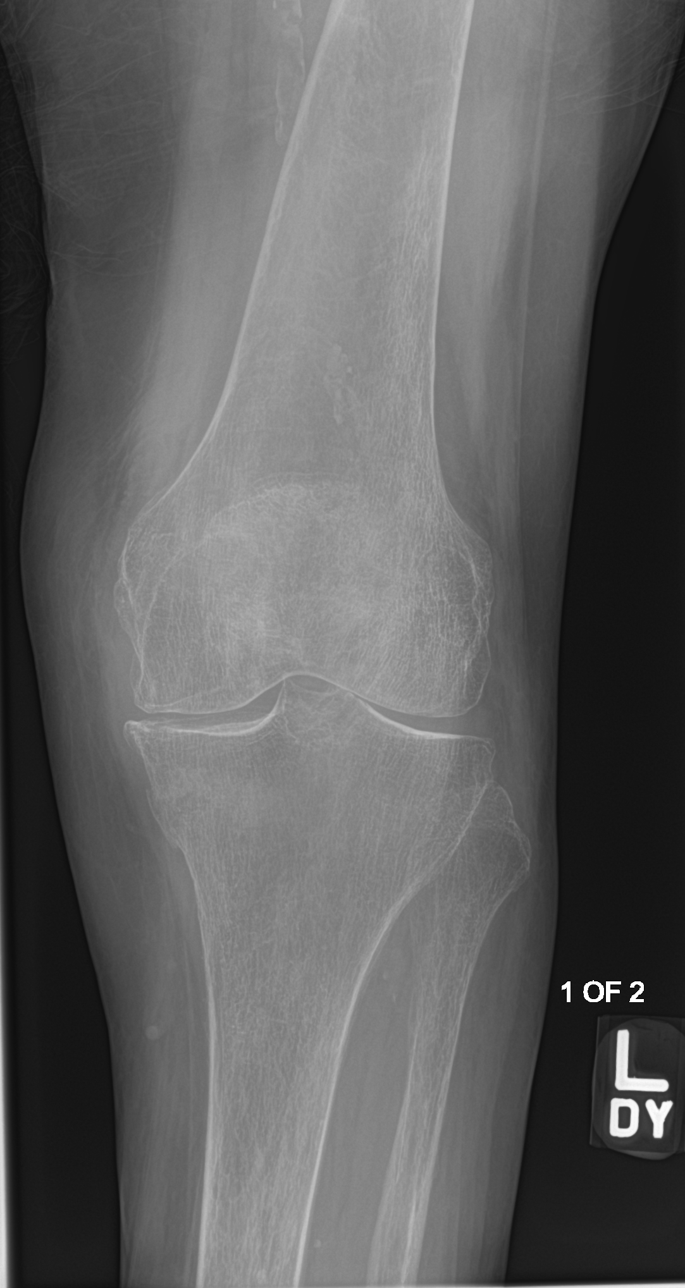
[im 2/5]
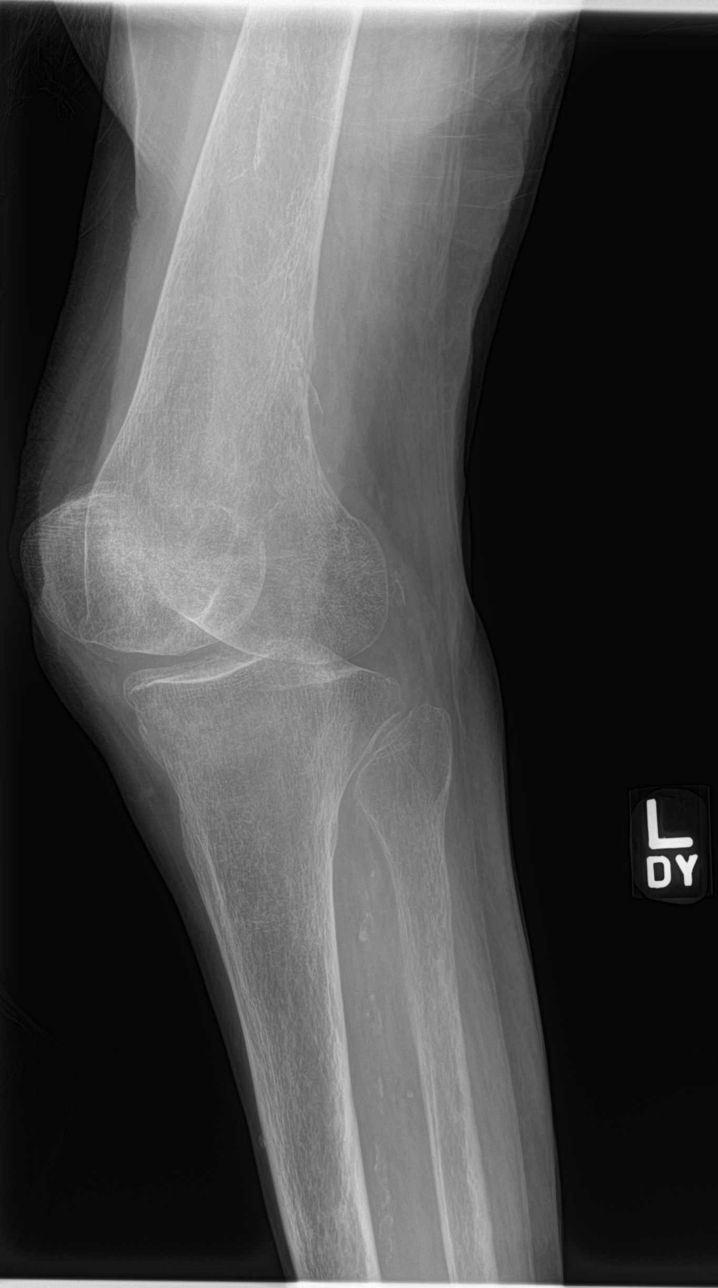
[im 3/5]
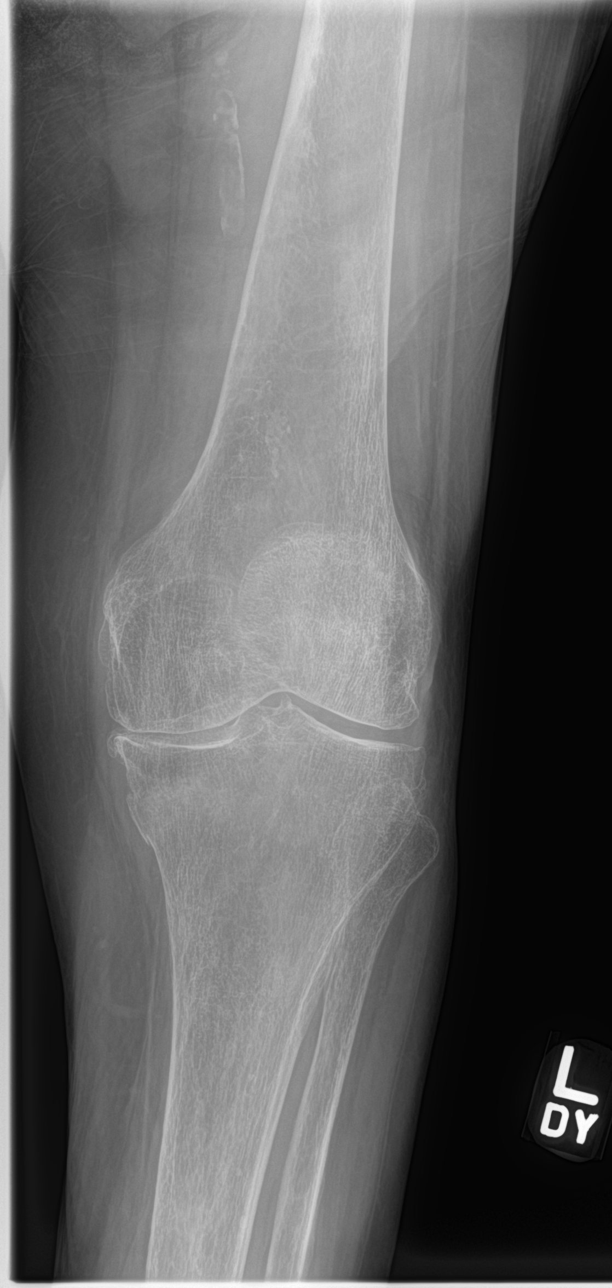
[im 4/5]
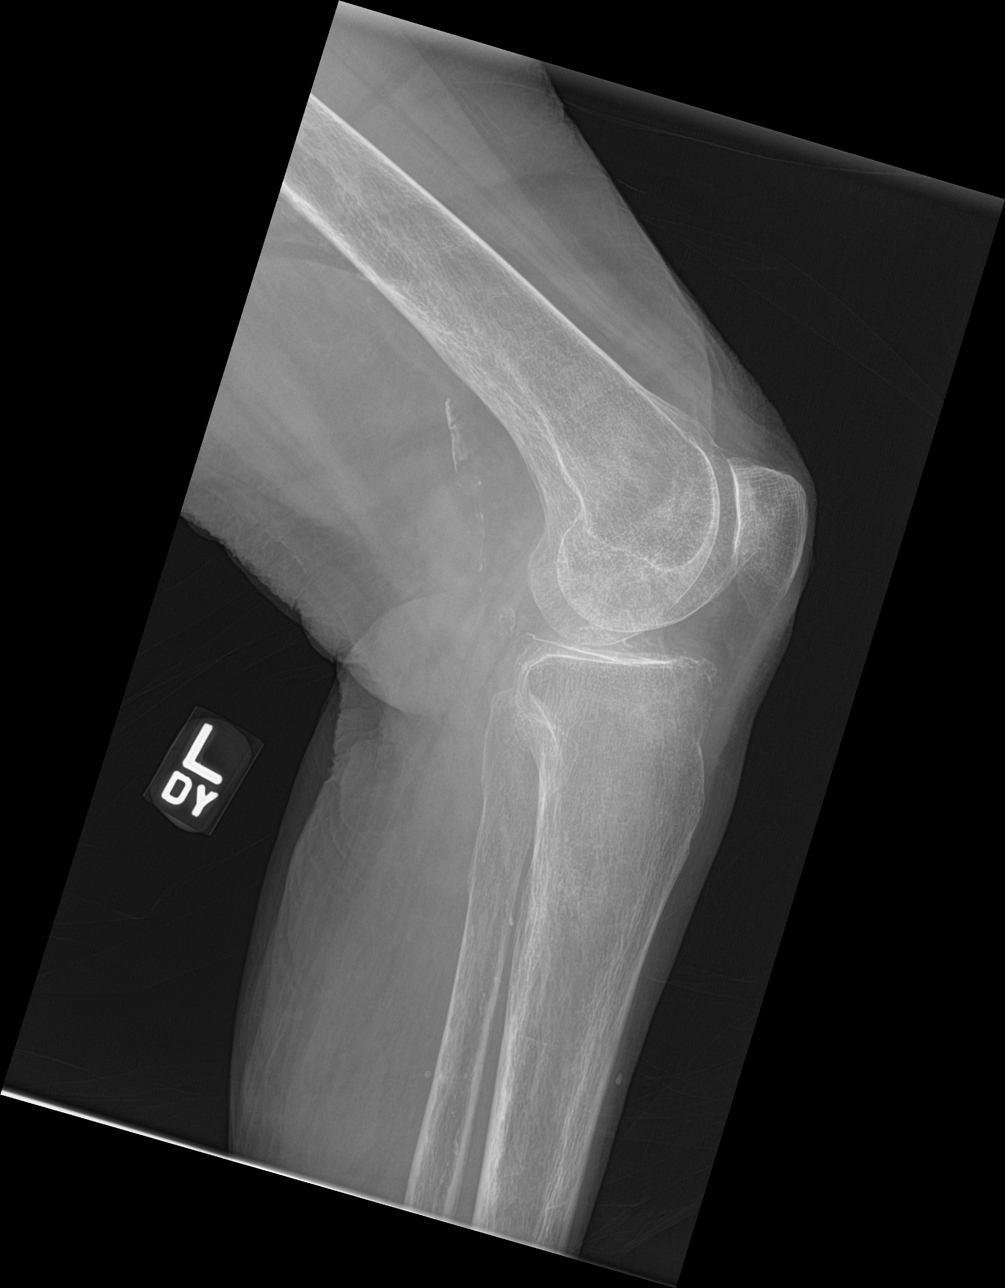
[im 5/5]
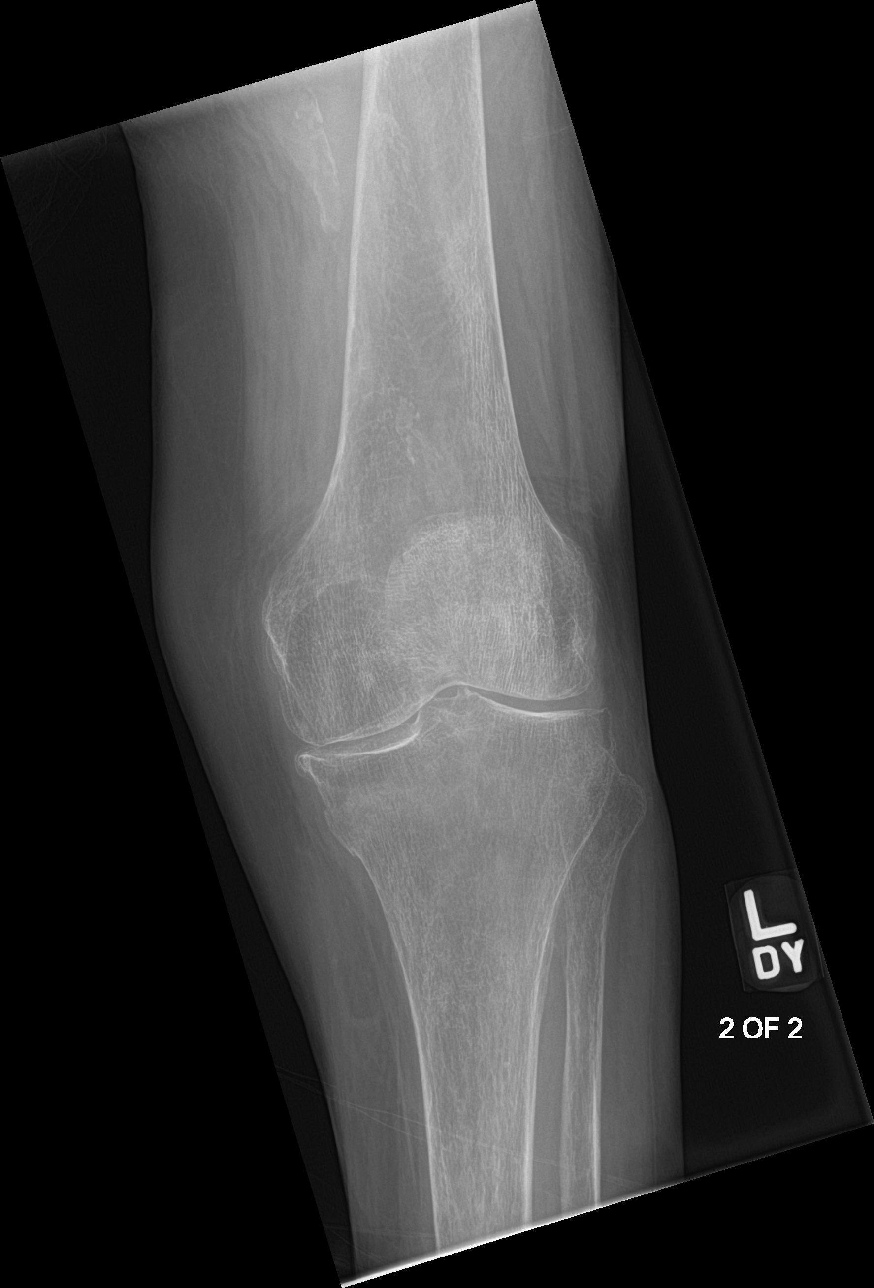

[5 of 5 positions shown; findings below may reference images not displayed]

FINDINGS: Diffuse osteopenia. Moderate medial and lateral joint space
narrowing. Mild to moderate medial and minimal lateral spur
formation. No fracture, dislocation or effusion seen. Atheromatous
arterial calcifications.
IMPRESSION: 1. No fracture or effusion.
2. Medial and lateral compartment degenerative changes.

## 2018-08-05 IMAGING — CR DG ANKLE COMPLETE 3+V*L*
1 series · 3 of 3 positions shown · non-contrast
Comparison: None.

CLINICAL DATA: Left ankle pain and bruising following a fall 1 week
ago.

EXAM:
LEFT ANKLE COMPLETE - 3+ VIEW

[Series 1: dg ankle complete left · 0.14mm/px · 3 of 3 slices shown]
[im 1/3]
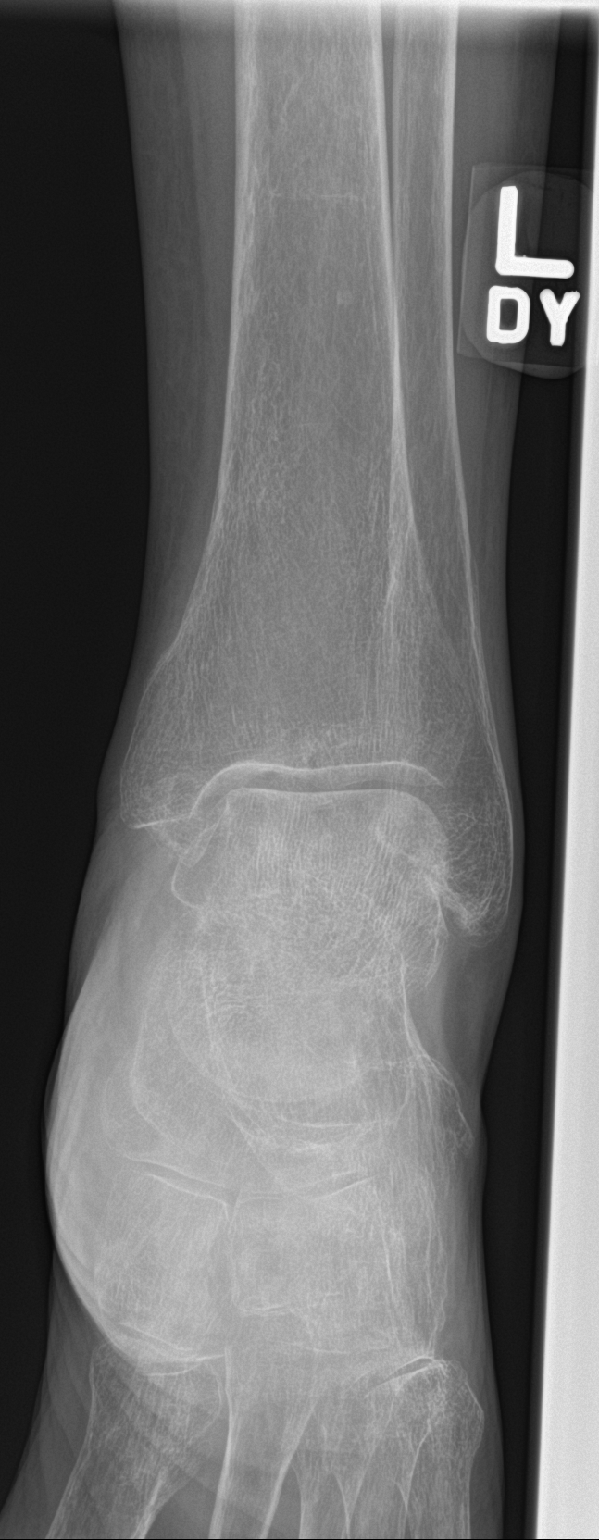
[im 2/3]
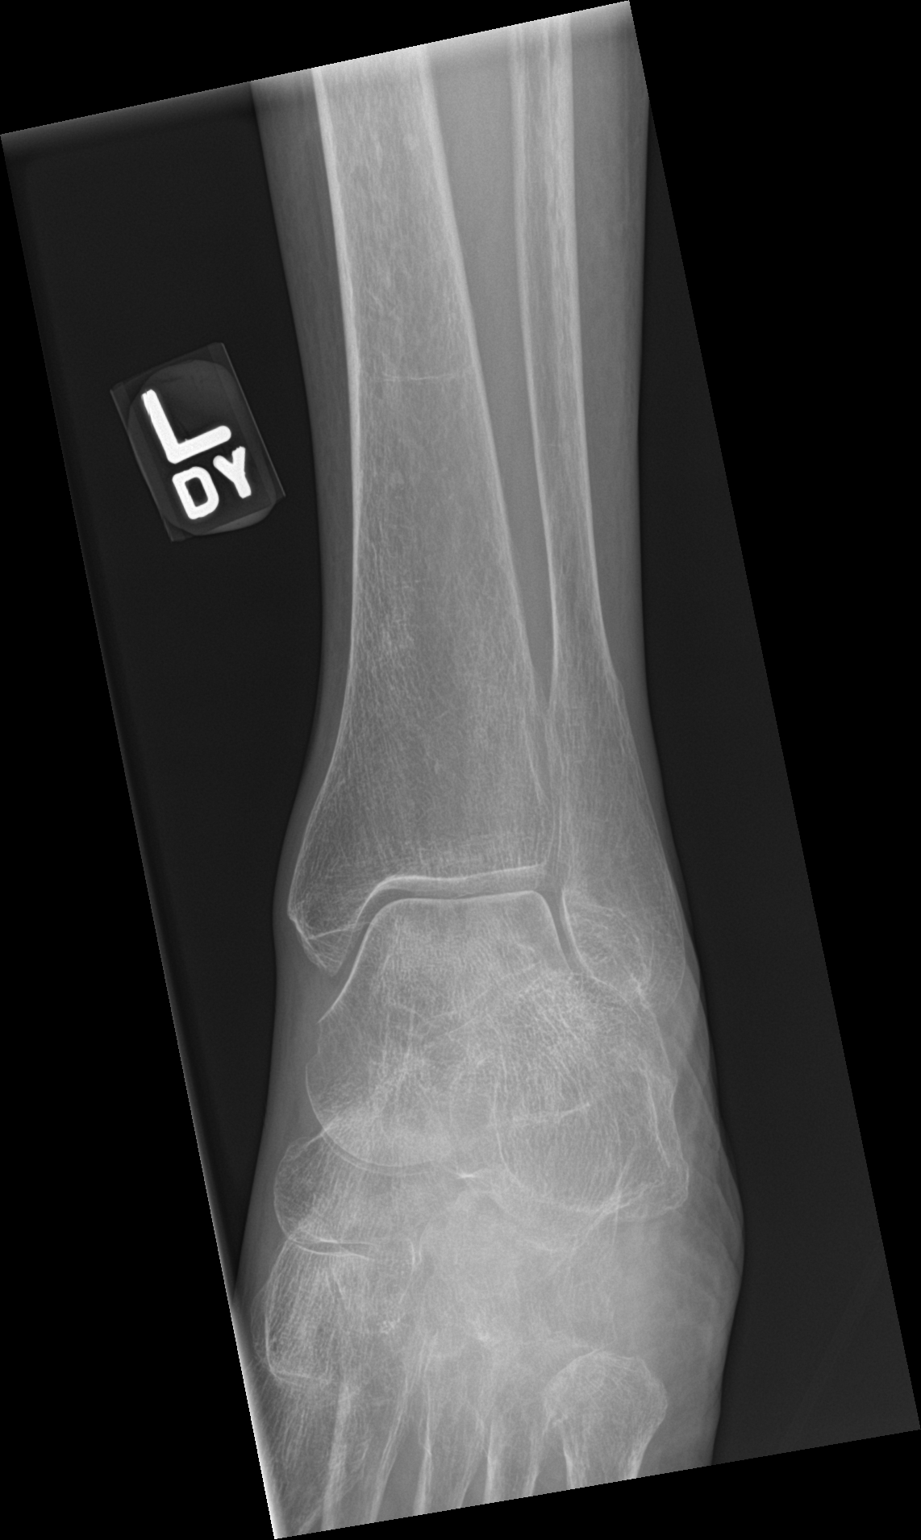
[im 3/3]
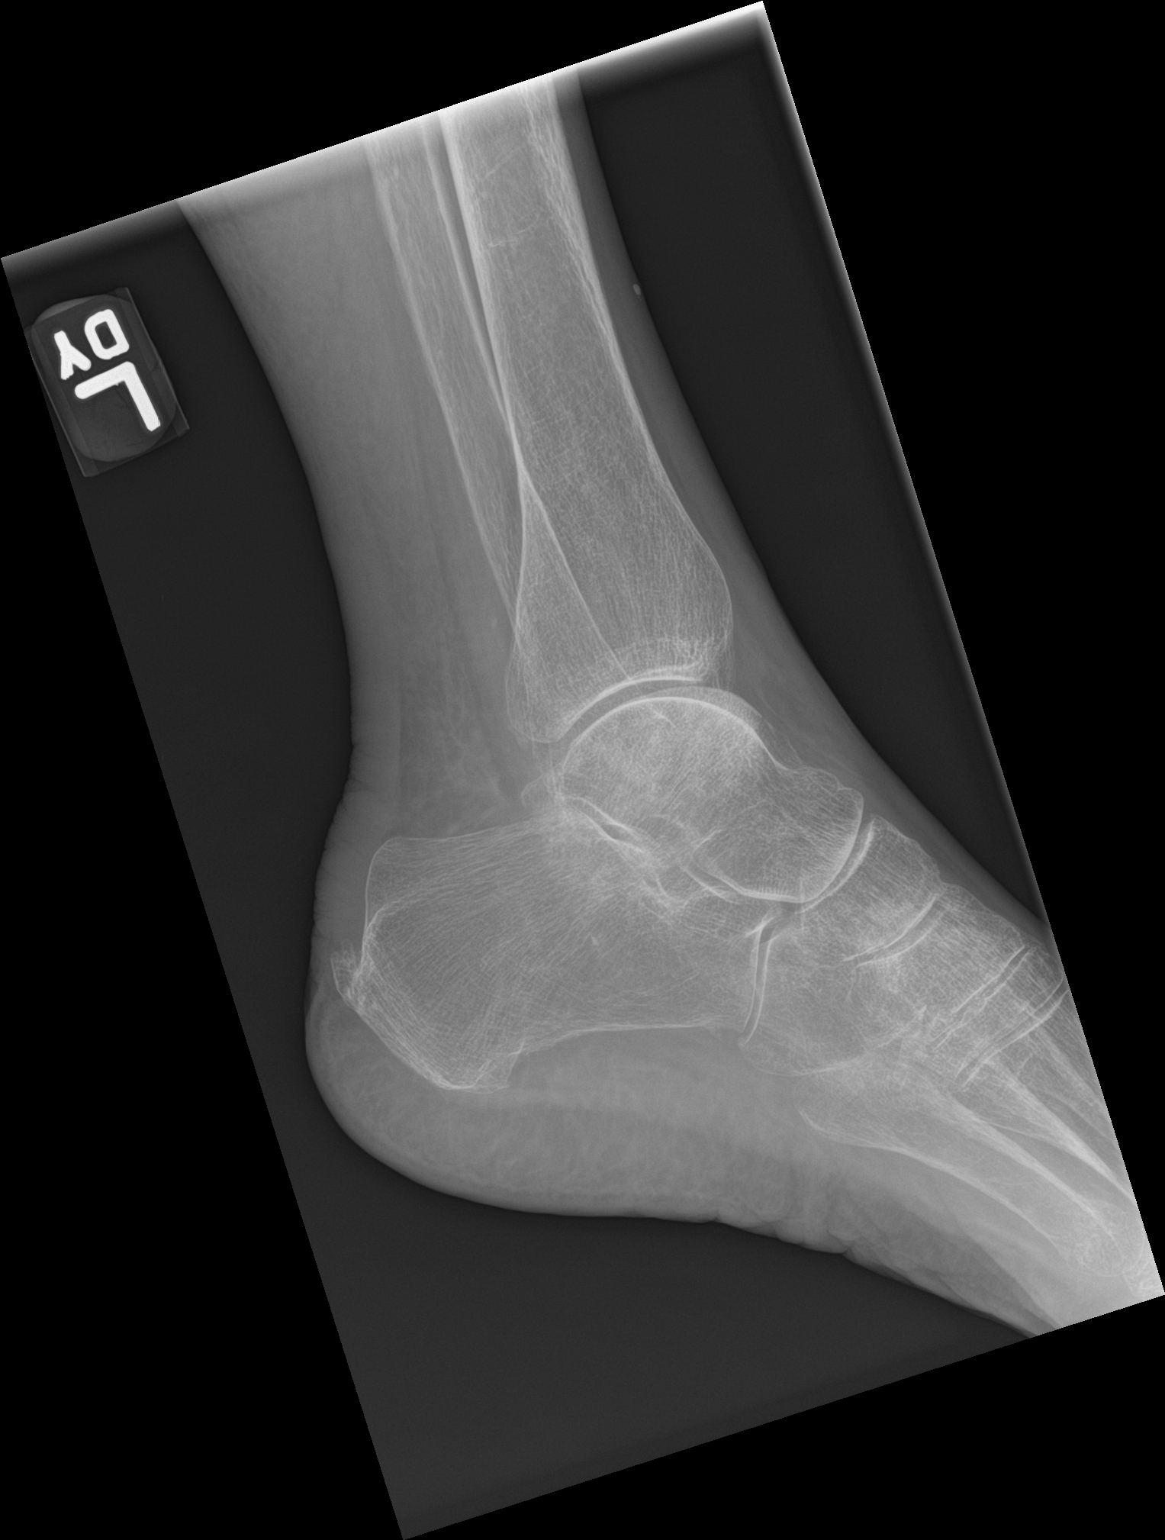

[3 of 3 positions shown; findings below may reference images not displayed]

FINDINGS: Diffuse osteopenia. Mild medial soft tissue swelling. No fracture,
dislocation or effusion seen. Moderate posterior and minimal
inferior calcaneal spur formation.
IMPRESSION: No fracture, dislocation or effusion.

## 2018-08-12 ENCOUNTER — Non-Acute Institutional Stay (SKILLED_NURSING_FACILITY): Payer: Medicare Other | Admitting: Adult Health

## 2018-08-12 ENCOUNTER — Encounter: Payer: Self-pay | Admitting: Adult Health

## 2018-08-12 DIAGNOSIS — M545 Low back pain, unspecified: Secondary | ICD-10-CM

## 2018-08-12 DIAGNOSIS — I480 Paroxysmal atrial fibrillation: Secondary | ICD-10-CM

## 2018-08-12 DIAGNOSIS — G629 Polyneuropathy, unspecified: Secondary | ICD-10-CM

## 2018-08-12 DIAGNOSIS — K219 Gastro-esophageal reflux disease without esophagitis: Secondary | ICD-10-CM

## 2018-08-12 DIAGNOSIS — F325 Major depressive disorder, single episode, in full remission: Secondary | ICD-10-CM

## 2018-08-12 DIAGNOSIS — I10 Essential (primary) hypertension: Secondary | ICD-10-CM

## 2018-08-12 DIAGNOSIS — G8929 Other chronic pain: Secondary | ICD-10-CM

## 2018-08-12 NOTE — Progress Notes (Signed)
Location:  [facility] Number: [place] of Service:  SNF ([phone]) Provider:  Durenda Age, NP  Patient Care Team: Juluis Pitch, MD as PCP - General (Family Medicine) Gerlene Fee, NP as Nurse Practitioner (Geriatric Medicine)  Extended Emergency Contact Information Primary Emergency Contact: [name] Address: [address]          [place], [place] [address] [name] of [place] Phone: [phone] Relation: Daughter Secondary Emergency Contact: Lingerfelt,Brad Address: [address] [place], [place] [address] [name] of [place] Phone: [phone] Relation: [name]  Code Status:  DNR  Goals of care: Advanced Directive information Advanced Directives 08/12/2018  Does Patient Have a Medical Advance Directive? Yes  Type of Advance Directive Out of facility DNR (pink MOST or yellow form)  Does patient want to make changes to medical advance directive? No - Patient declined  Would patient like information on creating a medical advance directive? -  Pre-existing out of facility DNR order (yellow form or pink MOST form) -     Chief Complaint  Patient presents with   Medical Management of Chronic Issues    Routine Visit    HPI:  Pt is a 85 y.o. female seen today for medical management of chronic diseases. She has PMH of arthritis, ascending aortic aneurysm, PAF and hypertension. She was seen in the room today. She denies having pain today. She has taken at least once a day of the PRN Oxycodone BPs are stable - 136/40, 136/59, 145/43, 129/38, 137/53. No reported SOB.   Past Medical History:  Diagnosis Date   Adenoma of rectum    Adrenal adenoma    Anemia    Anemia, unspecified    Arthritis    Ascending aortic aneurysm (HCC)    a. s/p repair 07/2014   Atrophic vaginitis    Benign neoplasm of colon, unspecified    COPD (chronic obstructive pulmonary disease) (HCC)    Coronary  artery disease, non-occlusive    a. LHC 06/2014 without evidence of obstructive disease   Depression    Diverticulosis    Dysphagia    Dysrhythmia    Essential hypertension    benign   Frequent UTI    GERD (gastroesophageal reflux disease)    Hemiplegia and hemiparesis    following cerebral infarction affecting left non-dominant side   Hypertension    Microscopic hematuria    Nonrheumatic aortic valve insufficiency    Osteoporosis    unspecified   Panic attacks    Paroxysmal atrial fibrillation (HCC)    Pernicious anemia    Renal cyst    CKD STAGE 3   Stroke ([place]) 07/23/2014   a. post-operative setting in 07/2014 leading to left UE hemiapresis and left LE weakness   Urge incontinence    Past Surgical History:  Procedure Laterality Date   ABDOMINAL HYSTERECTOMY  1972   AORTOILIAC BYPASS     ASCENDING AORTIC ANEURYSM REPAIR  07/23/2014   07/2014   BLEPHAROPLASTY     CATARACT EXTRACTION  2005   GASTROSTOMY W/ FEEDING TUBE     INTRAMEDULLARY (IM) NAIL INTERTROCHANTERIC Left 02/26/2016   Procedure: INTRAMEDULLARY (IM) NAIL INTERTROCHANTRIC;  Surgeon: Earnestine Leys, MD;  Location: [facility] ORS;  Service: Orthopedics;  Laterality: Left;   KYPHOPLASTY N/A 08/26/2016   Procedure: KYPHOPLASTY T12;  Surgeon: Hessie Knows, MD;  Location: [facility] ORS;  Service: Orthopedics;  Laterality: N/A;   KYPHOPLASTY N/A  06/17/2018   Procedure: L5 KYPHOPLASTY;  Surgeon: Hessie Knows, MD;  Location: [facility] ORS;  Service: Orthopedics;  Laterality: N/A;   LEFT HEART CATH  06/2014   PR ASCEND AORTIC GRAFT INCL VALVE SUSPENSION  07/07/2014   Procedure: ASCENDING AORTA GRAFT, WITH CARDIOPULMONARY BYPASS, WITH OR WITHOUT VALVE SUSPENSION; Surgeon: Dwaine Deter, MD; Location: MAIN OR [facility]; Service: Cardiothoracic   PR LAP, GASTROTOMY W/O TUBE CONSTR  08/08/2014   Procedure: LAPAROSCOPY, SURGICAL; GASTOSTOMY W/O CONSTRUCTION OF GASTRIC TUBE (EG, STAMM PROCEDURE)(SEPARATE PROCED);  Surgeon: Fredrik Rigger, MD; Location: [facility] OR [facility]; Service: Gastrointestinal   PR PLACE PERCUT GASTROSTOMY TUBE  08/08/2014   Procedure: UGI ENDO; W/DIRECTED PLCMT PERQ GASTROSTOMY TUBE; Surgeon: Fredrik Rigger, MD; Location: MAIN OR [facility]; Service: Gastrointestinal    Allergies  Allergen Reactions   Iodinated Diagnostic Agents Itching and Swelling    Other reaction(s): Unknown   Nitrofurantoin Diarrhea    Other reaction(s): Unknown   Omnipaque [Iohexol]    Solifenacin Other (See Comments)    indigestion   Ciprofloxacin Other (See Comments) and Rash    Colitis   Metronidazole Itching and Rash   Sulfa Antibiotics Itching and Rash    Outpatient Encounter Medications as of 08/12/2018  Medication Sig   acetaminophen (TYLENOL) 325 MG tablet Take 2 tablets (650 mg total) by mouth 2 (two) times daily.   albuterol (PROVENTIL HFA;VENTOLIN HFA) 108 (90 Base) MCG/ACT inhaler Inhale 2 puffs into the lungs every 4 (four) hours as needed for wheezing or shortness of breath.   alum & mag hydroxide-simeth (MAALOX PLUS) 400-400-40 MG/5ML suspension Take 30 mLs by mouth every 4 (four) hours as needed.   aspirin EC 81 MG tablet Take 81 mg by mouth daily.   BREO ELLIPTA 100-25 MCG/INH AEPB Inhale 1 puff into the lungs daily. Rinse mouth well after use   carboxymethylcellulose (REFRESH TEARS) 0.5 % SOLN Place 2 drops into both eyes. 1 - 4 times daily as needed   diclofenac sodium (VOLTAREN) 1 % GEL Apply 4 g topically t bilateral hips and knees every 8 hours as needed for pain   diltiazem (CARDIZEM) 60 MG tablet Take 60 mg by mouth daily.   docusate sodium (COLACE) 100 MG capsule Take 1 capsule (100 mg total) by mouth 2 (two) times daily.   gabapentin (NEURONTIN) 300 MG capsule Take 300 mg by mouth every 12 (twelve) hours.    ipratropium-albuterol (DUONEB) 0.5-2.5 (3) MG/3ML SOLN Take 3 mLs by nebulization 3 (three) times daily.    Lidocaine 4 % PTCH Apply 1 patch  daily to right Flank / sore ribs.  Remove after 12 hours   magnesium hydroxide (MILK OF MAGNESIA) 400 MG/5ML suspension Take 30 mLs by mouth daily as needed for mild constipation.   methocarbamol (ROBAXIN) 500 MG tablet Take 1 tablet (500 mg total) by mouth every 8 (eight) hours as needed for muscle spasms.   montelukast (SINGULAIR) 10 MG tablet Take 10 mg by mouth at bedtime.    oxyCODONE (ROXICODONE) 5 MG immediate release tablet Take 1 tablet (5 mg total) by mouth every 12 (twelve) hours as needed for moderate pain or severe pain.   OXYGEN Inhale 2 L into the lungs continuous as needed.   pantoprazole (PROTONIX) 40 MG tablet Take 40 mg by mouth daily.   Polyethyl Glycol-Propyl Glycol (SYSTANE) 0.4-0.3 % GEL ophthalmic gel Place 1 application into both eyes at bedtime. (2 drops ) for dry eyes (Ointments are on Backorder)    polyethylene glycol (MIRALAX /  GLYCOLAX) packet Take 17 g by mouth 2 (two) times daily as needed.   potassium chloride (K-DUR,KLOR-CON) 10 MEQ tablet Take 10 mEq by mouth daily.    predniSONE (DELTASONE) 5 MG tablet Take 5 mg by mouth daily with breakfast.    senna-docusate (SENNA-PLUS) 8.6-50 MG tablet Take 2 tablets by mouth 2 (two) times daily.    sertraline (ZOLOFT) 100 MG tablet Take 100 mg by mouth daily.    sertraline (ZOLOFT) 50 MG tablet Take 50 mg by mouth daily.    Skin Protectants, Misc. (MINERIN) CREA Apply liberal amount daily to arms and legs after the bath and PRN for skin integrity   sodium phosphate Pediatric (FLEET) 3.5-9.5 GM/59ML enema Place 1 enema rectally once as needed for severe constipation. Take once every 3 days if constipation is not relieved by Milk of magnesia or Bisacodyl Suppository   torsemide (DEMADEX) 10 MG tablet Take 10 mg by mouth daily.    UNABLE TO FIND Diet Type: NAS   [DISCONTINUED] guaiFENesin (ROBITUSSIN) 100 MG/5ML liquid Take 200 mg by mouth every 4 (four) hours as needed for cough.   No facility-administered  encounter medications on file as of 08/12/2018.     Review of Systems  GENERAL: No change in appetite, no fatigue, no weight changes, no fever, chills or weakness MOUTH and THROAT: Denies oral discomfort, gingival pain or bleeding RESPIRATORY: no cough, SOB, DOE, wheezing, hemoptysis CARDIAC: No chest pain, edema or palpitations GI: No abdominal pain, diarrhea, constipation, heart burn, nausea or vomiting NEUROLOGICAL: Denies dizziness, syncope, numbness, or headache PSYCHIATRIC: Denies feelings of depression or anxiety. No report of hallucinations, insomnia, paranoia, or agitation   Immunization History  Administered Date(s) Administered   Influenza Inj Mdck Quad Pf 02/14/2016   Influenza-Unspecified 02/02/2014, 03/01/2016, 01/27/2017, 02/21/2018   PPD Test 08/10/2014, 08/24/2014, 02/20/2016, 02/02/2018   Pneumococcal Conjugate-13 06/21/2018   Pneumococcal-Unspecified 02/20/2016   Pertinent  Health Maintenance Due  Topic Date Due   INFLUENZA VACCINE  12/04/2018   PNA vac Low Risk Adult  Completed   DEXA SCAN  Discontinued     Vitals:   08/12/18 0957  BP: (!) 136/40  Pulse: 66  Resp: 18  Temp: 97.6 F (36.4 C)  TempSrc: Oral  SpO2: 95%  Weight: 133 lb (60.3 kg)  Height: 5\' 1"  (1.549 m)   Body mass index is 25.13 kg/m.  Physical Exam  GENERAL APPEARANCE: Well nourished. In no acute distress. Normal body habitus SKIN:  Skin is warm and dry.  MOUTH and THROAT: Lips are without lesions. Oral mucosa is moist and without lesions. Tongue is normal in shape, size, and color and without lesions RESPIRATORY: Breathing is even & unlabored, BS CTAB CARDIAC: RRR, no murmur,no extra heart sounds, no edema GI: Abdomen soft, normal BS, no masses, no tenderness NEUROLOGICAL: There is no tremor. Speech is clear. Alert and oriented X 3. Left arm not moving. Left leg weak PSYCHIATRIC:  Affect and behavior are appropriate  Labs reviewed: Recent Labs    11/03/17 0640  03/04/18 0640 06/11/18 1730  NA 140 141 141  K 3.9 4.9 4.4  CL 103 105 107  CO2 30 30 26   GLUCOSE 87 88 105*  BUN 29* 30* 30*  CREATININE 1.07* 1.27* 1.19*  CALCIUM 8.7* 8.5* 9.0  MG 2.2  --   --    Recent Labs    11/03/17 0640 06/11/18 1730  AST 22 18  ALT 13 12  ALKPHOS 76 70  BILITOT 0.5 0.7  PROT  5.6* 5.7*  ALBUMIN 3.5 3.7   Recent Labs    02/09/18 0530 03/04/18 0640 06/11/18 1730  WBC 6.5 6.6 4.4  NEUTROABS 3.7 3.7 2.8  HGB 11.0* 10.3* 11.0*  HCT 33.1* 32.5* 34.3*  MCV 94.0 96.4 91.2  PLT 165 178 198   Lab Results  Component Value Date   TSH 5.829 (H) 11/03/2017   Lab Results  Component Value Date   HGBA1C 5.9 (H) 02/25/2016   Lab Results  Component Value Date   CHOL 153 11/03/2017   HDL 83 11/03/2017   LDLCALC 59 11/03/2017   TRIG 53 11/03/2017   CHOLHDL 1.8 11/03/2017     Assessment/Plan  1. Paroxysmal atrial fibrillation (HCC) - rate-controlled, continue Cardizem 60 mg 1 tab daily  2. Neuropathy -Continue gabapentin 300 mg 1 capsule every 12 hours  3. Chronic bilateral low back pain without sciatica -Had closed wedge compression fracture of L5 vertebra status post kyphoplasty on 06/17/2018, will continue Robaxin as needed and oxycodone as needed  4. Gastroesophageal reflux disease without esophagitis -Stable, continue Protonix DR 40 mg 1 tab daily  5. 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 Benign essential HTN -Well-controlled, continue Cardizem 60 mg 1 tab daily and torsemide 10 mg 1 tab daily   Family/ staff Communication: Discussed plan of care with resident.  Labs/tests ordered: None  Goals of care: Long-term care   Durenda Age, NP [facility] Adult [facility] [phone] (Monday-Friday 8:00 a.m. - 5:00 p.m.) [phone] (after hours)

## 2018-09-03 ENCOUNTER — Encounter
Admission: RE | Admit: 2018-09-03 | Discharge: 2018-09-03 | Disposition: A | Discharge status: Home / Self Care | Payer: Medicare Other | Source: Ambulatory Visit | Attending: Internal Medicine | Admitting: Internal Medicine

## 2018-09-06 ENCOUNTER — Non-Acute Institutional Stay (SKILLED_NURSING_FACILITY): Payer: Medicare Other | Admitting: Adult Health

## 2018-09-06 ENCOUNTER — Encounter: Payer: Self-pay | Admitting: Adult Health

## 2018-09-06 DIAGNOSIS — K219 Gastro-esophageal reflux disease without esophagitis: Secondary | ICD-10-CM

## 2018-09-06 DIAGNOSIS — G8929 Other chronic pain: Secondary | ICD-10-CM

## 2018-09-06 DIAGNOSIS — M792 Neuralgia and neuritis, unspecified: Secondary | ICD-10-CM

## 2018-09-06 DIAGNOSIS — J438 Other emphysema: Secondary | ICD-10-CM

## 2018-09-06 DIAGNOSIS — H04123 Dry eye syndrome of bilateral lacrimal glands: Secondary | ICD-10-CM

## 2018-09-06 DIAGNOSIS — I10 Essential (primary) hypertension: Secondary | ICD-10-CM | POA: Diagnosis not present

## 2018-09-06 DIAGNOSIS — I480 Paroxysmal atrial fibrillation: Secondary | ICD-10-CM | POA: Diagnosis not present

## 2018-09-06 DIAGNOSIS — F325 Major depressive disorder, single episode, in full remission: Secondary | ICD-10-CM

## 2018-09-06 NOTE — Progress Notes (Signed)
Location:  [facility] Number: [place]:  SNF (31) Provider:  Durenda Age, DNP  Patient Care Team: Juluis Pitch, MD as PCP - General (Family Medicine) Gerlene Fee, NP as Nurse Practitioner (Geriatric Medicine)  Extended Emergency Contact Information Primary Emergency Contact: [name] Address: [address]          [place], [place] [address] [name] of [place] Phone: [phone] Relation: Daughter Secondary Emergency Contact: Lingerfelt,Brad Address: [address] [place], [place] [address] [name] of [place] Phone: [phone] Relation: [name]  Code Status:  DNR  Goals of care: Advanced Directive information Advanced Directives 09/06/2018  Does Patient Have a Medical Advance Directive? Yes  Type of Advance Directive Out of facility DNR (pink MOST or yellow form)  Does patient want to make changes to medical advance directive? No - Patient declined  Would patient like information on creating a medical advance directive? -  Pre-existing out of facility DNR order (yellow form or pink MOST form) -     Chief Complaint  Patient presents with  . Medical Management of Chronic Issues    Routine TVAB SNF visit    HPI:  Pt is a 85 y.o. female seen today for medical management of chronic diseases.  She is a long-term care resident of [facility]. She has a PMH of AAA, COPD, CAD, essential hypertension, GERD, PAF, stroke and osteoporosis. She was seen in the room today. She verbalized having no lower back pain at present but would have it occasionally. She has used her PRN Oxycodone 7X in the last 14 days. No reported SOB nor wheezing. She is able to talk to her family over the phone. She understands the reason for the quarantine (COVID-19).   Past Medical History:  Diagnosis Date  . Adenoma of rectum   . Adrenal adenoma   . Anemia   . Anemia, unspecified   . Arthritis   .  Ascending aortic aneurysm ([place])    a. s/p repair 07/2014  . Atrophic vaginitis   . Benign neoplasm of colon, unspecified   . COPD (chronic obstructive pulmonary disease) ([place])   . Coronary artery disease, non-occlusive    a. LHC 06/2014 without evidence of obstructive disease  . Depression   . Diverticulosis   . Dysphagia   . Dysrhythmia   . Essential hypertension    benign  . Frequent UTI   . GERD (gastroesophageal reflux disease)   . Hemiplegia and hemiparesis    following cerebral infarction affecting left non-dominant side  . Hypertension   . Microscopic hematuria   . Nonrheumatic aortic valve insufficiency   . Osteoporosis    unspecified  . Panic attacks   . Paroxysmal atrial fibrillation (HCC)   . Pernicious anemia   . Renal cyst    CKD STAGE 3  . Stroke ([place]) 07/23/2014   a. post-operative setting in 07/2014 leading to left UE hemiapresis and left LE weakness  . Urge incontinence    Past Surgical History:  Procedure Laterality Date  . ABDOMINAL HYSTERECTOMY  1972  . AORTOILIAC BYPASS    . ASCENDING AORTIC ANEURYSM REPAIR  07/23/2014   07/2014  . BLEPHAROPLASTY    . CATARACT EXTRACTION  2005  . GASTROSTOMY W/ FEEDING TUBE    . INTRAMEDULLARY (IM) NAIL INTERTROCHANTERIC Left 02/26/2016   Procedure: INTRAMEDULLARY (IM) NAIL INTERTROCHANTRIC;  Surgeon: Earnestine Leys, MD;  Location[facility]  ORS;  Service: Orthopedics;  Laterality: Left;  . KYPHOPLASTY N/A 08/26/2016   Procedure: KYPHOPLASTY T12;  Surgeon: Hessie Knows, MD;  Location: [facility] ORS;  Service: Orthopedics;  Laterality: N/A;  . KYPHOPLASTY N/A 06/17/2018   Procedure: L5 KYPHOPLASTY;  Surgeon: Hessie Knows, MD;  Location: [facility] ORS;  Service: Orthopedics;  Laterality: N/A;  . LEFT HEART CATH  06/2014  . PR ASCEND AORTIC GRAFT INCL VALVE SUSPENSION  07/07/2014   Procedure: ASCENDING AORTA GRAFT, WITH CARDIOPULMONARY BYPASS, WITH OR WITHOUT VALVE SUSPENSION; Surgeon: Dwaine Deter, MD; Location: MAIN OR [facility];  Service: Cardiothoracic  . PR LAP, GASTROTOMY W/O TUBE CONSTR  08/08/2014   Procedure: LAPAROSCOPY, SURGICAL; GASTOSTOMY W/O CONSTRUCTION OF GASTRIC TUBE (EG, STAMM PROCEDURE)(SEPARATE PROCED); Surgeon: Fredrik Rigger, MD; Location: MAIN OR [facility]; Service: Gastrointestinal  . PR PLACE PERCUT GASTROSTOMY TUBE  08/08/2014   Procedure: UGI ENDO; W/DIRECTED PLCMT PERQ GASTROSTOMY TUBE; Surgeon: Fredrik Rigger, MD; Location: MAIN OR [facility]; Service: Gastrointestinal    Allergies  Allergen Reactions  . Iodinated Diagnostic Agents Itching and Swelling    Other reaction(s): Unknown  . Nitrofurantoin Diarrhea    Other reaction(s): Unknown  . Omnipaque [Iohexol]   . Solifenacin Other (See Comments)    indigestion  . Ciprofloxacin Other (See Comments) and Rash    Colitis  . Metronidazole Itching and Rash  . Sulfa Antibiotics Itching and Rash    Outpatient Encounter Medications as of 09/06/2018  Medication Sig  . acetaminophen (TYLENOL) 325 MG tablet Take 2 tablets (650 mg total) by mouth 2 (two) times daily.  Marland Kitchen albuterol (PROVENTIL HFA;VENTOLIN HFA) 108 (90 Base) MCG/ACT inhaler Inhale 2 puffs into the lungs every 4 (four) hours as needed for wheezing or shortness of breath.  Marland Kitchen alum & mag hydroxide-simeth (MAALOX PLUS) 400-400-40 MG/5ML suspension Take 30 mLs by mouth every 4 (four) hours as needed.  Marland Kitchen aspirin EC 81 MG tablet Take 81 mg by mouth daily.  Marland Kitchen BREO ELLIPTA 100-25 MCG/INH AEPB Inhale 1 puff into the lungs daily. Rinse mouth well after use  . carboxymethylcellulose (REFRESH TEARS) 0.5 % SOLN Place 2 drops into both eyes. 1 - 4 times daily as needed  . diclofenac sodium (VOLTAREN) 1 % GEL Apply 4 g topically 3 (three) times daily as needed. Apply to bilateral hips and knees every 8 hours as needed for pain  . diltiazem (CARDIZEM) 60 MG tablet Take 60 mg by mouth daily.  Marland Kitchen docusate sodium (COLACE) 100 MG capsule Take 1 capsule (100 mg total) by mouth 2 (two) times daily.  Marland Kitchen  gabapentin (NEURONTIN) 300 MG capsule Take 300 mg by mouth every 12 (twelve) hours.   Marland Kitchen ipratropium-albuterol (DUONEB) 0.5-2.5 (3) MG/3ML SOLN Take 3 mLs by nebulization 3 (three) times daily.   . Lidocaine 4 % PTCH Apply 1 patch daily to right Flank / sore ribs.  Remove after 12 hours  . magnesium hydroxide (MILK OF MAGNESIA) 400 MG/5ML suspension Take 30 mLs by mouth daily as needed for mild constipation.  . methocarbamol (ROBAXIN) 500 MG tablet Take 1 tablet (500 mg total) by mouth every 8 (eight) hours as needed for muscle spasms.  . montelukast (SINGULAIR) 10 MG tablet Take 10 mg by mouth at bedtime.   Marland Kitchen oxyCODONE (ROXICODONE) 5 MG immediate release tablet Take 1 tablet (5 mg total) by mouth every 12 (twelve) hours as needed for moderate pain or severe pain.  . OXYGEN Inhale 2 L into the lungs continuous as needed.  . pantoprazole (PROTONIX) 40 MG  tablet Take 40 mg by mouth daily.  Vladimir Faster Glycol-Propyl Glycol (SYSTANE) 0.4-0.3 % GEL ophthalmic gel Place 1 application into both eyes at bedtime. (2 drops ) for dry eyes (Ointments are on Backorder)   . polyethylene glycol (MIRALAX / GLYCOLAX) packet Take 17 g by mouth 2 (two) times daily as needed.  . potassium chloride (K-DUR,KLOR-CON) 10 MEQ tablet Take 10 mEq by mouth daily.   . predniSONE (DELTASONE) 5 MG tablet Take 5 mg by mouth daily with breakfast.   . senna-docusate (SENNA-PLUS) 8.6-50 MG tablet Take 2 tablets by mouth 2 (two) times daily.   . sertraline (ZOLOFT) 100 MG tablet Take 100 mg by mouth daily.   . sertraline (ZOLOFT) 50 MG tablet Take 50 mg by mouth daily.   . Skin Protectants, Misc. (MINERIN) CREA Apply liberal amount daily to arms and legs after the bath and PRN for skin integrity  . sodium phosphate Pediatric (FLEET) 3.5-9.5 GM/59ML enema Place 1 enema rectally once as needed for severe constipation. Take once every 3 days if constipation is not relieved by Milk of magnesia or Bisacodyl Suppository  . torsemide  (DEMADEX) 10 MG tablet Take 10 mg by mouth daily.   Marland Kitchen UNABLE TO FIND Diet Type: NAS   No facility-administered encounter medications on file as of 09/06/2018.     Review of Systems  GENERAL: No change in appetite, no fatigue, no weight changes, no fever, chills or weakness MOUTH and THROAT: Denies oral discomfort, gingival pain or bleeding, pain from teeth or hoarseness   RESPIRATORY: no cough, SOB, DOE, wheezing, hemoptysis CARDIAC: No chest pain, edema or palpitations GI: No abdominal pain, diarrhea, constipation, heart burn, nausea or vomiting NEUROLOGICAL: Denies dizziness, syncope, numbness, or headache PSYCHIATRIC: Denies feelings of depression or anxiety. No report of hallucinations, insomnia, paranoia, or agitation    Immunization History  Administered Date(s) Administered  . Influenza Inj Mdck Quad Pf 02/14/2016  . Influenza-Unspecified 02/02/2014, 03/01/2016, 01/27/2017, 02/21/2018  . PPD Test 08/10/2014, 08/24/2014, 02/20/2016, 02/02/2018  . Pneumococcal Conjugate-13 06/21/2018  . Pneumococcal-Unspecified 02/20/2016   Pertinent  Health Maintenance Due  Topic Date Due  . INFLUENZA VACCINE  12/04/2018  . PNA vac Low Risk Adult  Completed  . DEXA SCAN  Discontinued   No flowsheet data found.   Vitals:   09/06/18 0945  BP: (!) 136/42  Pulse: 70  Resp: 18  Temp: 98 F (36.7 C)  TempSrc: Oral  SpO2: 94%  Weight: 138 lb 12.8 oz (63 kg)  Height: 5\' 1"  (1.549 m)   Body mass index is 26.23 kg/m.  Physical Exam  GENERAL APPEARANCE: Well nourished. In no acute distress. Normal body habitus SKIN:  Skin is warm and dry.  MOUTH and THROAT: Lips are without lesions. Oral mucosa is moist and without lesions. Tongue is normal in shape, size, and color and without lesions RESPIRATORY: Breathing is even & unlabored, BS CTAB CARDIAC: RRR, no murmur,no extra heart sounds, no edema GI: Abdomen soft, normal BS, no masses, no tenderness NEUROLOGICAL: There is no tremor.  Speech is clear. Unable to move LUE. Alert and oriented X 3 PSYCHIATRIC: Affect and behavior are appropriate  Labs reviewed: Recent Labs    11/03/17 0640 03/04/18 0640 06/11/18 1730  NA 140 141 141  K 3.9 4.9 4.4  CL 103 105 107  CO2 30 30 26   GLUCOSE 87 88 105*  BUN 29* 30* 30*  CREATININE 1.07* 1.27* 1.19*  CALCIUM 8.7* 8.5* 9.0  MG 2.2  --   --  Recent Labs    11/03/17 0640 06/11/18 1730  AST 22 18  ALT 13 12  ALKPHOS 76 70  BILITOT 0.5 0.7  PROT 5.6* 5.7*  ALBUMIN 3.5 3.7   Recent Labs    02/09/18 0530 03/04/18 0640 06/11/18 1730  WBC 6.5 6.6 4.4  NEUTROABS 3.7 3.7 2.8  HGB 11.0* 10.3* 11.0*  HCT 33.1* 32.5* 34.3*  MCV 94.0 96.4 91.2  PLT 165 178 198   Lab Results  Component Value Date   TSH 5.829 (H) 11/03/2017   Lab Results  Component Value Date   HGBA1C 5.9 (H) 02/25/2016   Lab Results  Component Value Date   CHOL 153 11/03/2017   HDL 83 11/03/2017   LDLCALC 59 11/03/2017   TRIG 53 11/03/2017   CHOLHDL 1.8 11/03/2017    Assessment/Plan  1. Paroxysmal atrial fibrillation (HCC) - rate-controlled, continue Cardizem  2. Dry eye syndrome of both lacrimal glands - denies eye pain, continue Refresh Tears PRN and Systane eye gtts at bedtime  3. Benign essential HTN Well-controlled, continue Cardizem, Torsemide  4. Other emphysema ([place]) - no SOB nor wheezing, continue Breo-Ellipta, Ipratropium-albuterol, Montelukast, Prednisone, Proventil HFA PRN  5. Gastroesophageal reflux disease without esophagitis - stable, continue Protonix  6. 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 Neuropathic pain - stable, continue Gabapentin  8. Other chronic pain - well-controlled, continue Oxycodone PRN, Salonpas, Voltaren 1% gel PRN, Robaxin PRN    Family/ staff Communication:  Discussed plan of care with resident.  Labs/tests ordered:  None  Goals of care:   Long-term  care   Durenda Age, [place] and Adult Medicine [phone] (Monday-Friday 8:00 a.m. - 5:00 p.m.) [phone] (after hours)

## 2018-10-04 ENCOUNTER — Encounter: Payer: Self-pay | Admitting: Adult Health

## 2018-10-04 ENCOUNTER — Non-Acute Institutional Stay (SKILLED_NURSING_FACILITY): Payer: Medicare Other | Admitting: Adult Health

## 2018-10-04 ENCOUNTER — Encounter
Admission: RE | Admit: 2018-10-04 | Discharge: 2018-10-04 | Disposition: A | Discharge status: Home / Self Care | Payer: Medicare Other | Source: Ambulatory Visit | Attending: Internal Medicine | Admitting: Internal Medicine

## 2018-10-04 DIAGNOSIS — M545 Low back pain, unspecified: Secondary | ICD-10-CM

## 2018-10-04 DIAGNOSIS — F325 Major depressive disorder, single episode, in full remission: Secondary | ICD-10-CM

## 2018-10-04 DIAGNOSIS — G8929 Other chronic pain: Secondary | ICD-10-CM

## 2018-10-04 DIAGNOSIS — K219 Gastro-esophageal reflux disease without esophagitis: Secondary | ICD-10-CM | POA: Diagnosis not present

## 2018-10-04 DIAGNOSIS — J438 Other emphysema: Secondary | ICD-10-CM

## 2018-10-04 DIAGNOSIS — I480 Paroxysmal atrial fibrillation: Secondary | ICD-10-CM

## 2018-10-04 DIAGNOSIS — I10 Essential (primary) hypertension: Secondary | ICD-10-CM

## 2018-10-04 NOTE — Progress Notes (Signed)
Location:  [facility] Number: [place]:  SNF [phone]) Provider:  Durenda Age, [place], FNP-BC  Patient Care Team: Juluis Pitch, MD as PCP - General (Family Medicine) Nyoka Cowden Phylis Bougie, NP as Nurse Practitioner (Geriatric Medicine)  Extended Emergency Contact Information Primary Emergency Contact: [name] Address: [address]          [place], [place] [address] [name] of [place] Phone: [phone] Relation: Daughter Secondary Emergency Contact: Lingerfelt,Brad Address: [address] [place], [place] [address] [name] of [place] Phone: [phone] Relation: [name]  Code Status:  DNR  Goals of care: Advanced Directive information Advanced Directives 10/04/2018  Does Patient Have a Medical Advance Directive? Yes  Type of Advance Directive Out of facility DNR (pink MOST or yellow form)  Does patient want to make changes to medical advance directive? No - Patient declined  Would patient like information on creating a medical advance directive? -  Pre-existing out of facility DNR order (yellow form or pink MOST form) Yellow form placed in chart (order not valid for inpatient use)     Chief Complaint  Patient presents with  . Medical Management of Chronic Issues    Routine Visit    HPI:  Pt is a 85 y.o. female seen today for medical management of chronic diseases.  AAA, COPD, CAD, hypertension, GERD, PAF, stroke and osteoporosis.  She denies cough no shortness of breath.  BPs 136/64, 140/56, 96/66, 136/42.  She denies being depressed.  Past Medical History:  Diagnosis Date  . Adenoma of rectum   . Adrenal adenoma   . Anemia   . Anemia, unspecified   . Arthritis   . Ascending aortic aneurysm ([place])    a. s/p repair 07/2014  . Atrophic vaginitis   . Benign neoplasm of colon, unspecified   . COPD (chronic obstructive pulmonary disease) ([place])   . Coronary artery disease,  non-occlusive    a. LHC 06/2014 without evidence of obstructive disease  . Depression   . Diverticulosis   . Dysphagia   . Dysrhythmia   . Essential hypertension    benign  . Frequent UTI   . GERD (gastroesophageal reflux disease)   . Hemiplegia and hemiparesis    following cerebral infarction affecting left non-dominant side  . Hypertension   . Microscopic hematuria   . Nonrheumatic aortic valve insufficiency   . Osteoporosis    unspecified  . Panic attacks   . Paroxysmal atrial fibrillation ([facility])   . Pernicious anemia   . Renal cyst    CKD STAGE 3  . Stroke ([place]) 07/23/2014   a. post-operative setting in 07/2014 leading to left UE hemiapresis and left LE weakness  . Urge incontinence    Past Surgical History:  Procedure Laterality Date  . ABDOMINAL HYSTERECTOMY  1972  . AORTOILIAC BYPASS    . ASCENDING AORTIC ANEURYSM REPAIR  07/23/2014   07/2014  . BLEPHAROPLASTY    . CATARACT EXTRACTION  2005  . GASTROSTOMY W/ FEEDING TUBE    . INTRAMEDULLARY (IM) NAIL INTERTROCHANTERIC Left 02/26/2016   Procedure: INTRAMEDULLARY (IM) NAIL INTERTROCHANTRIC;  Surgeon: Earnestine Leys, MD;  Location: [facility] ORS;  Service: Orthopedics;  Laterality: Left;  . KYPHOPLASTY N/A 08/26/2016   Procedure: KYPHOPLASTY T12;  Surgeon: Hessie Knows, MD;  Location: [facility] ORS;  Service: Orthopedics;  Laterality: N/A;  . KYPHOPLASTY N/A 06/17/2018   Procedure: L5 KYPHOPLASTY;  Surgeon:  Hessie Knows, MD;  Location: [facility] ORS;  Service: Orthopedics;  Laterality: N/A;  . LEFT HEART CATH  06/2014  . PR ASCEND AORTIC GRAFT INCL VALVE SUSPENSION  07/07/2014   Procedure: ASCENDING AORTA GRAFT, WITH CARDIOPULMONARY BYPASS, WITH OR WITHOUT VALVE SUSPENSION; Surgeon: Dwaine Deter, MD; Location[facility]; Service: Cardiothoracic  . PR LAP, GASTROTOMY W/O TUBE CONSTR  08/08/2014   Procedure: LAPAROSCOPY, SURGICAL; GASTOSTOMY W/O CONSTRUCTION OF GASTRIC TUBE (EG, STAMM PROCEDURE)(SEPARATE PROCED); Surgeon: Fredrik Rigger, MD; Location: MAIN OR [facility]; Service: Gastrointestinal  . PR PLACE PERCUT GASTROSTOMY TUBE  08/08/2014   Procedure: UGI ENDO; W/DIRECTED PLCMT PERQ GASTROSTOMY TUBE; Surgeon: Fredrik Rigger, MD; Location: MAIN OR [facility]; Service: Gastrointestinal    Allergies  Allergen Reactions  . Iodinated Diagnostic Agents Itching and Swelling    Other reaction(s): Unknown  . Nitrofurantoin Diarrhea    Other reaction(s): Unknown  . Omnipaque [Iohexol]   . Solifenacin Other (See Comments)    indigestion  . Ciprofloxacin Other (See Comments) and Rash    Colitis  . Metronidazole Itching and Rash  . Sulfa Antibiotics Itching and Rash    Outpatient Encounter Medications as of 10/04/2018  Medication Sig  . acetaminophen (TYLENOL) 325 MG tablet Take 2 tablets (650 mg total) by mouth 2 (two) times daily.  Marland Kitchen albuterol (PROVENTIL HFA;VENTOLIN HFA) 108 (90 Base) MCG/ACT inhaler Inhale 2 puffs into the lungs every 4 (four) hours as needed for wheezing or shortness of breath.  Marland Kitchen aspirin EC 81 MG tablet Take 81 mg by mouth daily.  Marland Kitchen BREO ELLIPTA 100-25 MCG/INH AEPB Inhale 1 puff into the lungs daily. Rinse mouth well after use  . carboxymethylcellulose (REFRESH TEARS) 0.5 % SOLN Place 2 drops into both eyes. 1 - 4 times daily as needed  . diltiazem (CARDIZEM) 60 MG tablet Take 60 mg by mouth daily.  Marland Kitchen docusate sodium (COLACE) 100 MG capsule Take 1 capsule (100 mg total) by mouth 2 (two) times daily.  Marland Kitchen gabapentin (NEURONTIN) 300 MG capsule Take 300 mg by mouth every 12 (twelve) hours.   Marland Kitchen ipratropium-albuterol (DUONEB) 0.5-2.5 (3) MG/3ML SOLN Take 3 mLs by nebulization 3 (three) times daily.   . Lidocaine 4 % PTCH Apply 1 patch daily to right Flank / sore ribs.  Remove after 12 hours  . magnesium hydroxide (MILK OF MAGNESIA) 400 MG/5ML suspension Take 30 mLs by mouth daily as needed for mild constipation.  . methocarbamol (ROBAXIN) 500 MG tablet Take 1 tablet (500 mg total) by mouth every 8  (eight) hours as needed for muscle spasms.  . montelukast (SINGULAIR) 10 MG tablet Take 10 mg by mouth at bedtime.   Marland Kitchen oxyCODONE (ROXICODONE) 5 MG immediate release tablet Take 1 tablet (5 mg total) by mouth every 12 (twelve) hours as needed for moderate pain or severe pain.  . OXYGEN Inhale 2 L into the lungs continuous as needed.  . pantoprazole (PROTONIX) 40 MG tablet Take 40 mg by mouth daily.  Vladimir Faster Glycol-Propyl Glycol (SYSTANE) 0.4-0.3 % GEL ophthalmic gel Place 1 application into both eyes at bedtime. (2 drops ) for dry eyes (Ointments are on Backorder)   . polyethylene glycol (MIRALAX / GLYCOLAX) packet Take 17 g by mouth 2 (two) times daily as needed.  . potassium chloride (K-DUR,KLOR-CON) 10 MEQ tablet Take 10 mEq by mouth daily.   . predniSONE (DELTASONE) 5 MG tablet Take 5 mg by mouth daily with breakfast.   . senna-docusate (SENNA-PLUS) 8.6-50 MG tablet Take 2 tablets  by mouth 2 (two) times daily.   . sertraline (ZOLOFT) 100 MG tablet Take 100 mg by mouth daily.   . sertraline (ZOLOFT) 50 MG tablet Take 50 mg by mouth daily.   . Skin Protectants, Misc. (MINERIN) CREA Apply liberal amount daily to arms and legs after the bath and PRN for skin integrity  . sodium phosphate Pediatric (FLEET) 3.5-9.5 GM/59ML enema Place 1 enema rectally once as needed for severe constipation. Take once every 3 days if constipation is not relieved by Milk of magnesia or Bisacodyl Suppository  . torsemide (DEMADEX) 10 MG tablet Take 10 mg by mouth daily.   Marland Kitchen UNABLE TO FIND Diet Type: NAS  . alum & mag hydroxide-simeth (MAALOX PLUS) 400-400-40 MG/5ML suspension Take 30 mLs by mouth every 4 (four) hours as needed.  . diclofenac sodium (VOLTAREN) 1 % GEL Apply 4 g topically 3 (three) times daily as needed. Apply to bilateral hips and knees every 8 hours as needed for pain   No facility-administered encounter medications on file as of 10/04/2018.     Review of Systems  GENERAL: No change in appetite,  no fatigue, no weight changes, no fever, chills or weakness MOUTH and THROAT: Denies oral discomfort, gingival pain or bleeding RESPIRATORY: no cough, SOB, DOE, wheezing, hemoptysis CARDIAC: No chest pain, edema or palpitations GI: No abdominal pain, diarrhea, constipation, heart burn, nausea or vomiting GU: Denies dysuria, frequency, hematuria, incontinence, or discharge PSYCHIATRIC: Denies feelings of depression or anxiety. No report of hallucinations, insomnia, paranoia, or agitation   Immunization History  Administered Date(s) Administered  . Influenza Inj Mdck Quad Pf 02/14/2016  . Influenza-Unspecified 02/02/2014, 03/01/2016, 01/27/2017, 02/21/2018  . PPD Test 08/10/2014, 08/24/2014, 02/20/2016, 02/02/2018  . Pneumococcal Conjugate-13 06/21/2018  . Pneumococcal-Unspecified 02/20/2016   Pertinent  Health Maintenance Due  Topic Date Due  . INFLUENZA VACCINE  12/04/2018  . PNA vac Low Risk Adult  Completed  . DEXA SCAN  Discontinued     Vitals:   10/04/18 0845  BP: 136/64  Pulse: 64  Resp: 16  Temp: (!) 97.5 F (36.4 C)  TempSrc: Oral  SpO2: 96%  Weight: 138 lb (62.6 kg)  Height: 5\' 1"  (1.549 m)   Body mass index is 26.07 kg/m.  Physical Exam  GENERAL APPEARANCE: Well nourished. In no acute distress. Normal body habitus SKIN:  Skin is warm and dry.  MOUTH and THROAT: Lips are without lesions. Oral mucosa is moist and without lesions. Tongue is normal in shape, size, and color and without lesions RESPIRATORY: Breathing is even & unlabored, BS CTAB CARDIAC: RRR, no murmur,no extra heart sounds, no edema GI: Abdomen soft, normal BS, no masses, no tenderness NEUROLOGICAL: There is no tremor. Speech is clear. LUE not moving, LLE weakness. Alert and oriented X 3. PSYCHIATRIC:  Affect and behavior are appropriate   Labs reviewed: Recent Labs    11/03/17 0640 03/04/18 0640 06/11/18 1730  NA 140 141 141  K 3.9 4.9 4.4  CL 103 105 107  CO2 30 30 26   GLUCOSE 87  88 105*  BUN 29* 30* 30*  CREATININE 1.07* 1.27* 1.19*  CALCIUM 8.7* 8.5* 9.0  MG 2.2  --   --    Recent Labs    11/03/17 0640 06/11/18 1730  AST 22 18  ALT 13 12  ALKPHOS 76 70  BILITOT 0.5 0.7  PROT 5.6* 5.7*  ALBUMIN 3.5 3.7   Recent Labs    02/09/18 0530 03/04/18 0640 06/11/18 1730  WBC 6.5  6.6 4.4  NEUTROABS 3.7 3.7 2.8  HGB 11.0* 10.3* 11.0*  HCT 33.1* 32.5* 34.3*  MCV 94.0 96.4 91.2  PLT 165 178 198   Lab Results  Component Value Date   TSH 5.829 (H) 11/03/2017   Lab Results  Component Value Date   HGBA1C 5.9 (H) 02/25/2016   Lab Results  Component Value Date   CHOL 153 11/03/2017   HDL 83 11/03/2017   LDLCALC 59 11/03/2017   TRIG 53 11/03/2017   CHOLHDL 1.8 11/03/2017     Assessment/Plan  1. Other emphysema ([place]) - no wheezing, continue Breo Ellipta 100-25 mcg/dose 1 inhalation daily and ipratropium-albuterol nebulization 3 times a day and prednisone 5 mg 1 tab daily  2. Paroxysmal atrial fibrillation (H[facility]) -Rate controlled, continue Cardizem 60 mg 1 tablet daily  3. Chronic bilateral low back pain without sciatica -Well-controlled, continue Salonpas 1 patch daily, gabapentin 300 mg 1 capsule every 12 hours, methocarbamol 500 mg 1 tab every 8 hours as needed, acetaminophen 325 mg 2 tabs = 650 mg twice a day and oxycodone 5 mg 1 tab every 12 hours PRN  4. Gastroesophageal reflux disease without esophagitis -Continue Protonix 40 mg 1 tab daily  5. 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 at bedtime  6. Benign essential HTN -Stable, continue torsemide 10 mg 1 tab daily and Klor-Con 10 meq 1 tab daily and Cardizem 60 mg 1 tab daily   Family/ staff Communication:  Discussed plan of care with resident.  Labs/tests ordered:  None  Goals of care:   Long-term care   Durenda Age, DNP, FNP-BC [facility]  [phone] (Monday-Friday 8:00 a.m. - 5:00 p.m.) [phone] (after hours)

## 2018-11-03 ENCOUNTER — Encounter
Admission: RE | Admit: 2018-11-03 | Discharge: 2018-11-03 | Disposition: A | Discharge status: Home / Self Care | Payer: Medicare Other | Source: Ambulatory Visit | Attending: Internal Medicine | Admitting: Internal Medicine

## 2019-01-13 ENCOUNTER — Other Ambulatory Visit: Payer: Self-pay

## 2019-01-13 ENCOUNTER — Emergency Department
Admission: EM | Admit: 2019-01-13 | Discharge: 2019-01-13 | Disposition: A | Discharge status: Home / Self Care | Payer: Medicare Other | Attending: Emergency Medicine | Admitting: Emergency Medicine

## 2019-01-13 ENCOUNTER — Emergency Department: Payer: Medicare Other

## 2019-01-13 DIAGNOSIS — N183 Chronic kidney disease, stage 3 (moderate): Secondary | ICD-10-CM | POA: Insufficient documentation

## 2019-01-13 DIAGNOSIS — Z87891 Personal history of nicotine dependence: Secondary | ICD-10-CM | POA: Diagnosis not present

## 2019-01-13 DIAGNOSIS — R0602 Shortness of breath: Secondary | ICD-10-CM | POA: Diagnosis not present

## 2019-01-13 DIAGNOSIS — R52 Pain, unspecified: Secondary | ICD-10-CM | POA: Diagnosis not present

## 2019-01-13 DIAGNOSIS — Z20828 Contact with and (suspected) exposure to other viral communicable diseases: Secondary | ICD-10-CM | POA: Diagnosis not present

## 2019-01-13 DIAGNOSIS — Z79899 Other long term (current) drug therapy: Secondary | ICD-10-CM | POA: Diagnosis not present

## 2019-01-13 DIAGNOSIS — I129 Hypertensive chronic kidney disease with stage 1 through stage 4 chronic kidney disease, or unspecified chronic kidney disease: Secondary | ICD-10-CM | POA: Diagnosis not present

## 2019-01-13 DIAGNOSIS — J449 Chronic obstructive pulmonary disease, unspecified: Secondary | ICD-10-CM | POA: Diagnosis not present

## 2019-01-13 DIAGNOSIS — I2510 Atherosclerotic heart disease of native coronary artery without angina pectoris: Secondary | ICD-10-CM | POA: Diagnosis not present

## 2019-01-13 LAB — CBC WITH DIFFERENTIAL/PLATELET
Abs Immature Granulocytes: 0.05 10*3/uL (ref 0.00–0.07)
Basophils Absolute: 0 10*3/uL (ref 0.0–0.1)
Basophils Relative: 0 %
Eosinophils Absolute: 0.1 10*3/uL (ref 0.0–0.5)
Eosinophils Relative: 2 %
HCT: 33.2 % — ABNORMAL LOW (ref 36.0–46.0)
Hemoglobin: 10.4 g/dL — ABNORMAL LOW (ref 12.0–15.0)
Immature Granulocytes: 1 %
Lymphocytes Relative: 20 %
Lymphs Abs: 1.1 10*3/uL (ref 0.7–4.0)
MCH: 28 pg (ref 26.0–34.0)
MCHC: 31.3 g/dL (ref 30.0–36.0)
MCV: 89.2 fL (ref 80.0–100.0)
Monocytes Absolute: 0.4 10*3/uL (ref 0.1–1.0)
Monocytes Relative: 7 %
Neutro Abs: 3.8 10*3/uL (ref 1.7–7.7)
Neutrophils Relative %: 70 %
Platelets: 190 10*3/uL (ref 150–400)
RBC: 3.72 MIL/uL — ABNORMAL LOW (ref 3.87–5.11)
RDW: 13.9 % (ref 11.5–15.5)
WBC: 5.4 10*3/uL (ref 4.0–10.5)
nRBC: 0 % (ref 0.0–0.2)

## 2019-01-13 LAB — COMPREHENSIVE METABOLIC PANEL
ALT: 11 U/L (ref 0–44)
AST: 17 U/L (ref 15–41)
Albumin: 3.9 g/dL (ref 3.5–5.0)
Alkaline Phosphatase: 127 U/L — ABNORMAL HIGH (ref 38–126)
Anion gap: 9 (ref 5–15)
BUN: 17 mg/dL (ref 8–23)
CO2: 27 mmol/L (ref 22–32)
Calcium: 8.9 mg/dL (ref 8.9–10.3)
Chloride: 106 mmol/L (ref 98–111)
Creatinine, Ser: 1.08 mg/dL — ABNORMAL HIGH (ref 0.44–1.00)
GFR calc Af Amer: 52 mL/min — ABNORMAL LOW (ref >60)
GFR calc non Af Amer: 45 mL/min — ABNORMAL LOW (ref >60)
Glucose, Bld: 93 mg/dL (ref 70–99)
Potassium: 4.2 mmol/L (ref 3.5–5.1)
Sodium: 142 mmol/L (ref 135–145)
Total Bilirubin: 0.6 mg/dL (ref 0.3–1.2)
Total Protein: 6.2 g/dL — ABNORMAL LOW (ref 6.5–8.1)

## 2019-01-13 LAB — CK: Total CK: 51 U/L (ref 38–234)

## 2019-01-13 LAB — BRAIN NATRIURETIC PEPTIDE: B Natriuretic Peptide: 258 pg/mL — ABNORMAL HIGH (ref 0.0–100.0)

## 2019-01-13 LAB — LACTIC ACID, PLASMA: Lactic Acid, Venous: 1.2 mmol/L (ref 0.5–1.9)

## 2019-01-13 LAB — TROPONIN I (HIGH SENSITIVITY): Troponin I (High Sensitivity): 8 ng/L (ref <18)

## 2019-01-13 LAB — SARS CORONAVIRUS 2 BY RT PCR (HOSPITAL ORDER, PERFORMED IN ~~LOC~~ HOSPITAL LAB): SARS Coronavirus 2: NEGATIVE

## 2019-01-13 NOTE — ED Notes (Signed)
X-ray at bedside

## 2019-01-13 NOTE — ED Triage Notes (Signed)
Pt from [facility] by [facility].  10/10 body pain, thinks she pulls a muscle, no falls recently, pain in middle of body.  Wants an xray.  [id number] systolic, 0000000 room air, HR 70.  Hx of stroke (left sided weakness).

## 2019-01-13 NOTE — Discharge Instructions (Addendum)
The test we have done today are all been negative.  There is no sign of any infection or anything else going on to explain the achiness.  Please return if you get worse or get a fever or feel sicker.  Please use Tylenol for the aches and pains for the time being.  If you not any better in 2 days return again.  Please have your doctor check on you later this week.

## 2019-01-13 NOTE — ED Notes (Signed)
Spoke to [place] at [facility] to give report.

## 2019-01-13 NOTE — ED Provider Notes (Signed)
[facility] Emergency Department Provider Note   ____________________________________________   First MD Initiated Contact with Patient 01/13/19 1127     (approximate)  I have reviewed the triage vital signs and the nursing notes.   HISTORY  Chief Complaint Muscle Pain    HPI Ana Schultz is a 85 y.o. female from [place] who complains of several days of aching all over and feeling short of breath.  She has a dry nonproductive cough.  She denies any fever.         Past Medical History:  Diagnosis Date  . Adenoma of rectum   . Adrenal adenoma   . Anemia   . Anemia, unspecified   . Arthritis   . Ascending aortic aneurysm ([place])    a. s/p repair 07/2014  . Atrophic vaginitis   . Benign neoplasm of colon, unspecified   . COPD (chronic obstructive pulmonary disease) ([place])   . Coronary artery disease, non-occlusive    a. LHC 06/2014 without evidence of obstructive disease  . Depression   . Diverticulosis   . Dysphagia   . Dysrhythmia   . Essential hypertension    benign  . Frequent UTI   . GERD (gastroesophageal reflux disease)   . Hemiplegia and hemiparesis    following cerebral infarction affecting left non-dominant side  . Hypertension   . Microscopic hematuria   . Nonrheumatic aortic valve insufficiency   . Osteoporosis    unspecified  . Panic attacks   . Paroxysmal atrial fibrillation (HCC)   . Pernicious anemia   . Renal cyst    CKD STAGE 3  . Stroke ([place]) 07/23/2014   a. post-operative setting in 07/2014 leading to left UE hemiapresis and left LE weakness  . Urge incontinence     Patient Active Problem List   Diagnosis Date Noted  . Closed wedge compression fracture of L5 vertebra with routine healing 06/20/2018  . Constipation due to opioid therapy 06/13/2018  . Shingles outbreak 03/23/2018  . Chronic constipation 02/21/2018  . Chronic back pain 02/21/2018  . Chronic cerebrovascular accident (CVA) 02/21/2018   . Hypokalemia 02/21/2018  . Neuropathic pain 02/14/2018  . Chronic cough 07/23/2017  . Closed fracture of shaft of right radius 07/23/2017  . Hemiplegia and hemiparesis following cerebral infarction affecting left non-dominant side ([place]) 08/28/2016  . Hx of falling 08/28/2016  . Dysphagia following cerebral infarction 08/28/2016  . Dysphagia, oropharyngeal 08/28/2016  . Cognitive communication deficit 08/28/2016  . Hypertensive chronic kidney disease w stg 1-4/unsp chr kdny 08/28/2016  . Chronic kidney disease, stage III (moderate) ([place]) 08/28/2016  . Vitamin B12 deficiency anemia due to intrinsic factor deficiency 08/28/2016  . Anxiety disorder 08/28/2016  . Insomnia 08/28/2016  . Major depressive disorder, single episode in full remission ([place]) 08/28/2016  . Dry eye syndrome of both lacrimal glands 08/28/2016  . Allergic rhinitis 08/28/2016  . Bell's palsy 08/28/2016  . Chronic pain 08/28/2016  . Hyperlipidemia 08/28/2016  . GERD (gastroesophageal reflux disease) 08/28/2016  . Closed compression fracture of L2 lumbar vertebra, sequela 04/03/2016  . Paroxysmal atrial fibrillation (HCC)   . Chronic obstructive pulmonary disease (COPD) ([place]) 11/09/2015  . Benign essential HTN 10/09/2014  . OP (osteoporosis) 10/09/2014  . Addison anemia 10/09/2014  . Tobacco abuse, in remission 08/02/2014  . Nonrheumatic aortic valve insufficiency 07/07/2014  . Aneurysm, ascending aorta ([place]) 07/07/2014    Past Surgical History:  Procedure Laterality Date  . ABDOMINAL HYSTERECTOMY  1972  . AORTOILIAC BYPASS    .  ASCENDING AORTIC ANEURYSM REPAIR  07/23/2014   07/2014  . BLEPHAROPLASTY    . CATARACT EXTRACTION  2005  . GASTROSTOMY W/ FEEDING TUBE    . INTRAMEDULLARY (IM) NAIL INTERTROCHANTERIC Left 02/26/2016   Procedure: INTRAMEDULLARY (IM) NAIL INTERTROCHANTRIC;  Surgeon: Earnestine Leys, MD;  Location: [facility] ORS;  Service: Orthopedics;  Laterality: Left;  . KYPHOPLASTY N/A 08/26/2016    Procedure: KYPHOPLASTY T12;  Surgeon: Hessie Knows, MD;  Location: [facility] ORS;  Service: Orthopedics;  Laterality: N/A;  . KYPHOPLASTY N/A 06/17/2018   Procedure: L5 KYPHOPLASTY;  Surgeon: Hessie Knows, MD;  Location: [facility] ORS;  Service: Orthopedics;  Laterality: N/A;  . LEFT HEART CATH  06/2014  . PR ASCEND AORTIC GRAFT INCL VALVE SUSPENSION  07/07/2014   Procedure: ASCENDING AORTA GRAFT, WITH CARDIOPULMONARY BYPASS, WITH OR WITHOUT VALVE SUSPENSION; Surgeon: Dwaine Deter, MD; Location: MAIN OR [facility]; Service: Cardiothoracic  . PR LAP, GASTROTOMY W/O TUBE CONSTR  08/08/2014   Procedure: LAPAROSCOPY, SURGICAL; GASTOSTOMY W/O CONSTRUCTION OF GASTRIC TUBE (EG, STAMM PROCEDURE)(SEPARATE PROCED); Surgeon: Fredrik Rigger, MD; Location: [facility]; Service: Gastrointestinal  . PR PLACE PERCUT GASTROSTOMY TUBE  08/08/2014   Procedure: UGI ENDO; W/DIRECTED PLCMT PERQ GASTROSTOMY TUBE; Surgeon: Fredrik Rigger, MD; Location: MAIN OR [facility]; Service: Gastrointestinal    Prior to Admission medications   Medication Sig Start Date End Date Taking? Authorizing Provider  acetaminophen (TYLENOL) 325 MG tablet Take 2 tablets (650 mg total) by mouth 2 (two) times daily. 08/02/18   Medina-Vargas, Monina C, NP  albuterol (PROVENTIL HFA;VENTOLIN HFA) 108 (90 Base) MCG/ACT inhaler Inhale 2 puffs into the lungs every 4 (four) hours as needed for wheezing or shortness of breath. 11/09/15   Flora Lipps, MD  alum & mag hydroxide-simeth (MAALOX PLUS) 400-400-40 MG/5ML suspension Take 30 mLs by mouth every 4 (four) hours as needed.    [provider]  aspirin EC 81 MG tablet Take 81 mg by mouth daily.    [provider]  BREO ELLIPTA 100-25 MCG/INH AEPB Inhale 1 puff into the lungs daily. Rinse mouth well after use 08/18/16   [provider]  carboxymethylcellulose (REFRESH TEARS) 0.5 % SOLN Place 2 drops into both eyes. 1 - 4 times daily as needed    [provider]   diclofenac sodium (VOLTAREN) 1 % GEL Apply 4 g topically 3 (three) times daily as needed. Apply to bilateral hips and knees every 8 hours as needed for pain 03/18/18   [provider]  diltiazem (CARDIZEM) 60 MG tablet Take 60 mg by mouth daily.    [provider]  docusate sodium (COLACE) 100 MG capsule Take 1 capsule (100 mg total) by mouth 2 (two) times daily. 03/01/16   Fritzi Mandes, MD  gabapentin (NEURONTIN) 300 MG capsule Take 300 mg by mouth every 12 (twelve) hours.  06/07/18   [provider]  ipratropium-albuterol (DUONEB) 0.5-2.5 (3) MG/3ML SOLN Take 3 mLs by nebulization 3 (three) times daily.     [provider]  Lidocaine 4 % PTCH Apply 1 patch daily to right Flank / sore ribs.  Remove after 12 hours    [provider]  magnesium hydroxide (MILK OF MAGNESIA) 400 MG/5ML suspension Take 30 mLs by mouth daily as needed for mild constipation.    [provider]  methocarbamol (ROBAXIN) 500 MG tablet Take 1 tablet (500 mg total) by mouth every 8 (eight) hours as needed for muscle spasms. 07/19/18   Medina-Vargas, Monina C, NP  montelukast (SINGULAIR) 10  MG tablet Take 10 mg by mouth at bedtime.     [provider]  oxyCODONE (ROXICODONE) 5 MG immediate release tablet Take 1 tablet (5 mg total) by mouth every 12 (twelve) hours as needed for moderate pain or severe pain. 08/02/18   Medina-Vargas, Monina C, NP  OXYGEN Inhale 2 L into the lungs continuous as needed.    [provider]  pantoprazole (PROTONIX) 40 MG tablet Take 40 mg by mouth daily.    [provider]  Polyethyl Glycol-Propyl Glycol (SYSTANE) 0.4-0.3 % GEL ophthalmic gel Place 1 application into both eyes at bedtime. (2 drops ) for dry eyes (Ointments are on Backorder)     [provider]  polyethylene glycol (MIRALAX / GLYCOLAX) packet Take 17 g by mouth 2 (two) times daily as needed.    [provider]  potassium chloride  (K-DUR,KLOR-CON) 10 MEQ tablet Take 10 mEq by mouth daily.  03/05/18   [provider]  predniSONE (DELTASONE) 5 MG tablet Take 5 mg by mouth daily with breakfast.  04/15/18   [provider]  senna-docusate (SENNA-PLUS) 8.6-50 MG tablet Take 2 tablets by mouth 2 (two) times daily.     [provider]  sertraline (ZOLOFT) 100 MG tablet Take 100 mg by mouth daily.     [provider]  sertraline (ZOLOFT) 50 MG tablet Take 50 mg by mouth daily.     [provider]  Skin Protectants, Misc. (MINERIN) CREA Apply liberal amount daily to arms and legs after the bath and PRN for skin integrity    [provider]  sodium phosphate Pediatric (FLEET) 3.5-9.5 GM/59ML enema Place 1 enema rectally once as needed for severe constipation. Take once every 3 days if constipation is not relieved by Milk of magnesia or Bisacodyl Suppository    [provider]  torsemide (DEMADEX) 10 MG tablet Take 10 mg by mouth daily.  02/16/18   [provider]  UNABLE TO FIND Diet Type: NAS    [provider]    Allergies Iodinated diagnostic agents, Nitrofurantoin, Omnipaque [iohexol], Solifenacin, Ciprofloxacin, Metronidazole, and Sulfa antibiotics  Family History  Problem Relation Age of Onset  . Lung cancer Son   . CAD Father   . Heart attack Father   . CAD Brother   . Heart attack Brother   . Stroke Mother     Social History Social History   Tobacco Use  . Smoking status: Former Smoker    Packs/day: 0.50    Years: 50.00    Pack years: 25.00    Types: Cigarettes    Quit date: 05/30/2014    Years since quitting: 4.6  . Smokeless tobacco: Never Used  Substance Use Topics  . Alcohol use: No    Comment: occasionally  . Drug use: No    Review of Systems  Constitutional: No fever/chills Eyes: No visual changes. ENT: No sore throat. Cardiovascular: Denies chest pain. Respiratory: Some shortness of breath. Gastrointestinal: No  abdominal pain.  No nausea, no vomiting.  No diarrhea.  No constipation. Genitourinary: Negative for dysuria. Musculoskeletal: Negative for back pain. Skin: Negative for rash. Neurological: Negative for headaches, focal weaknes  ____________________________________________   PHYSICAL EXAM:  VITAL SIGNS: ED Triage Vitals  Enc Vitals Group     BP 01/13/19 1129 (!) 181/50     Pulse Rate 01/13/19 1129 66     Resp --      Temp 01/13/19 1129 97.7 F (36.5 C)  Temp Source 01/13/19 1129 Oral     SpO2 01/13/19 1129 97 %     Weight 01/13/19 1132 138 lb (62.6 kg)     Height 01/13/19 1132 5\' 2"  (1.575 m)     Head Circumference --      Peak Flow --      Pain Score 01/13/19 1130 5     Pain Loc --      Pain Edu? --      Excl. in [place]? --     Constitutional: Alert and oriented. Well appearing and in no acute distress. Eyes: Conjunctivae are normal.  Head: Atraumatic. Nose: No congestion/rhinnorhea. Mouth/Throat: Mucous membranes are moist.  Oropharynx non-erythematous. Neck: No stridor.   Cardiovascular: Normal rate, regular rhythm. Grossly normal heart sounds.  Good peripheral circulation. Respiratory: Normal respiratory effort.  No retractions. Lungs CTAB. Gastrointestinal: Soft and nontender. No distention. No abdominal bruits. No CVA tenderness. Musculoskeletal: No lower extremity tenderness nor edema.   Neurologic:  Normal speech and language. No gross focal neurologic deficits are appreciated. No gait instability. Skin:  Skin is warm, dry and intact. No rash noted.   ____________________________________________   LABS (all labs ordered are listed, but only abnormal results are displayed)  Labs Reviewed  COMPREHENSIVE METABOLIC PANEL - Abnormal; Notable for the following components:      Result Value   Creatinine, Ser 1.08 (*)    Total Protein 6.2 (*)    Alkaline Phosphatase 127 (*)    GFR calc non Af Amer 45 (*)    GFR calc Af Amer 52 (*)    All other components  within normal limits  BRAIN NATRIURETIC PEPTIDE - Abnormal; Notable for the following components:   B Natriuretic Peptide 258.0 (*)    All other components within normal limits  CBC WITH DIFFERENTIAL/PLATELET - Abnormal; Notable for the following components:   RBC 3.72 (*)    Hemoglobin 10.4 (*)    HCT 33.2 (*)    All other components within normal limits  SARS CORONAVIRUS 2 (HOSPITAL ORDER, [place] [facility])  LACTIC ACID, PLASMA  CK  LACTIC ACID, PLASMA  URINALYSIS, COMPLETE (UACMP) WITH MICROSCOPIC  TROPONIN I (HIGH SENSITIVITY)  TROPONIN I (HIGH SENSITIVITY)   ____________________________________________  EKG  EKG read interpreted by me shows normal sinus rhythm rate of 67 normal axis.  Irregular baseline but no acute ST-T changes are seen.  The patient does appear to have right bundle branch block and left posterior hemiblock.  EKG is similar one from February this year. ____________________________________________  [place]  ED MD interpretation: Chest x-ray read by radiology reviewed by me shows stable enlargement of the aorta and some CHF.  Official radiology report(s): Dg Chest Portable 1 View  Result Date: 01/13/2019 CLINICAL DATA:  Cough and shortness of breath. EXAM: PORTABLE CHEST 1 VIEW COMPARISON:  November 05, 2014 FINDINGS: Normal cardiac silhouette. Marked enlargement of the aorta at the aortic arch, grossly stable radiographically. There is no evidence of focal airspace consolidation or pneumothorax. Probable small left pleural effusion. Mild mixed pattern pulmonary edema. Osseous structures are without acute abnormality. Soft tissues are grossly normal. IMPRESSION: 1. Marked enlargement of the aorta at the aortic arch, grossly stable radiographically. 2. Mild mixed pattern pulmonary edema with probable small left pleural effusion. Electronically Signed   By: Fidela Salisbury M.D.   On: 01/13/2019 12:16     ____________________________________________   PROCEDURES  Procedure(s) performed (including Critical Care):  Procedures   ____________________________________________   INITIAL  IMPRESSION / ASSESSMENT AND PLAN / ED COURSE  Patient's chest x-ray shows a little bit of CHF and her BNP is elevated but her O2 saturations on her usual amount of oxygen are good.  Her coronavirus test and the other blood work are all okay.  I am not sure why she is having aching in the arms legs chest and belly with no tenderness to palpation.  I will let her go and have her return if she is worse.             ____________________________________________   FINAL CLINICAL IMPRESSION(S) / ED DIAGNOSES  Final diagnoses:  Generalized body aches     ED Discharge Orders    None       Note:  This document was prepared using Dragon voice recognition software and may include unintentional dictation errors.    Nena Polio, MD 01/13/19 [phone]

## 2019-01-13 NOTE — ED Notes (Signed)
Waiting on [facility] to transport to [facility]

## 2019-02-11 ENCOUNTER — Encounter: Payer: Self-pay | Admitting: Emergency Medicine

## 2019-02-11 ENCOUNTER — Emergency Department: Payer: Medicare Other

## 2019-02-11 ENCOUNTER — Emergency Department
Admission: EM | Admit: 2019-02-11 | Discharge: 2019-02-11 | Disposition: A | Discharge status: Home / Self Care | Payer: Medicare Other | Attending: Emergency Medicine | Admitting: Emergency Medicine

## 2019-02-11 ENCOUNTER — Other Ambulatory Visit: Payer: Self-pay

## 2019-02-11 DIAGNOSIS — W231XXA Caught, crushed, jammed, or pinched between stationary objects, initial encounter: Secondary | ICD-10-CM | POA: Insufficient documentation

## 2019-02-11 DIAGNOSIS — I130 Hypertensive heart and chronic kidney disease with heart failure and stage 1 through stage 4 chronic kidney disease, or unspecified chronic kidney disease: Secondary | ICD-10-CM | POA: Insufficient documentation

## 2019-02-11 DIAGNOSIS — Z79899 Other long term (current) drug therapy: Secondary | ICD-10-CM | POA: Insufficient documentation

## 2019-02-11 DIAGNOSIS — W1839XA Other fall on same level, initial encounter: Secondary | ICD-10-CM | POA: Insufficient documentation

## 2019-02-11 DIAGNOSIS — Y929 Unspecified place or not applicable: Secondary | ICD-10-CM | POA: Insufficient documentation

## 2019-02-11 DIAGNOSIS — Y939 Activity, unspecified: Secondary | ICD-10-CM | POA: Diagnosis not present

## 2019-02-11 DIAGNOSIS — Z8673 Personal history of transient ischemic attack (TIA), and cerebral infarction without residual deficits: Secondary | ICD-10-CM | POA: Diagnosis not present

## 2019-02-11 DIAGNOSIS — Z87891 Personal history of nicotine dependence: Secondary | ICD-10-CM | POA: Diagnosis not present

## 2019-02-11 DIAGNOSIS — I509 Heart failure, unspecified: Secondary | ICD-10-CM | POA: Insufficient documentation

## 2019-02-11 DIAGNOSIS — Z7982 Long term (current) use of aspirin: Secondary | ICD-10-CM | POA: Diagnosis not present

## 2019-02-11 DIAGNOSIS — Y999 Unspecified external cause status: Secondary | ICD-10-CM | POA: Diagnosis not present

## 2019-02-11 DIAGNOSIS — I110 Hypertensive heart disease with heart failure: Secondary | ICD-10-CM | POA: Insufficient documentation

## 2019-02-11 DIAGNOSIS — N183 Chronic kidney disease, stage 3 unspecified: Secondary | ICD-10-CM | POA: Diagnosis not present

## 2019-02-11 DIAGNOSIS — S81802A Unspecified open wound, left lower leg, initial encounter: Secondary | ICD-10-CM | POA: Diagnosis not present

## 2019-02-11 MED ORDER — BACITRACIN-NEOMYCIN-POLYMYXIN 400-5-5000 EX OINT
TOPICAL_OINTMENT | Freq: Once | CUTANEOUS | Status: AC
  Administered 2019-02-11: 1 via TOPICAL
  Filled 2019-02-11: qty 2

## 2019-02-11 NOTE — ED Provider Notes (Signed)
[facility] Emergency Department Provider Note   ____________________________________________   First MD Initiated Contact with Patient 02/11/19 1815     (approximate)  I have reviewed the triage vital signs and the nursing notes.   HISTORY  Chief Complaint No chief complaint on file.  Chief complaint is wound on the left shin  HPI Ana Schultz is a 85 y.o. female who reports she had fallen on the ground and got her left leg stuck under the bed.  She was being pulled out from under the bed and her left leg was caught she got a bruise there which stayed that way until today when the skin opened up.  She reports she did not see any blood on the sheets.  Patient does not have a fever.  The area is somewhat painful but not particularly so.         Past Medical History:  Diagnosis Date  . Adenoma of rectum   . Adrenal adenoma   . Anemia   . Anemia, unspecified   . Arthritis   . Ascending aortic aneurysm ([place])    a. s/p repair 07/2014  . Atrophic vaginitis   . Benign neoplasm of colon, unspecified   . COPD (chronic obstructive pulmonary disease) ([place])   . Coronary artery disease, non-occlusive    a. LHC 06/2014 without evidence of obstructive disease  . Depression   . Diverticulosis   . Dysphagia   . Dysrhythmia   . Essential hypertension    benign  . Frequent UTI   . GERD (gastroesophageal reflux disease)   . Hemiplegia and hemiparesis    following cerebral infarction affecting left non-dominant side  . Hypertension   . Microscopic hematuria   . Nonrheumatic aortic valve insufficiency   . Osteoporosis    unspecified  . Panic attacks   . Paroxysmal atrial fibrillation (HCC)   . Pernicious anemia   . Renal cyst    CKD STAGE 3  . Stroke ([place]) 07/23/2014   a. post-operative setting in 07/2014 leading to left UE hemiapresis and left LE weakness  . Urge incontinence     Patient Active Problem List   Diagnosis Date Noted  . Closed  wedge compression fracture of L5 vertebra with routine healing 06/20/2018  . Constipation due to opioid therapy 06/13/2018  . Shingles outbreak 03/23/2018  . Chronic constipation 02/21/2018  . Chronic back pain 02/21/2018  . Chronic cerebrovascular accident (CVA) 02/21/2018  . Hypokalemia 02/21/2018  . Neuropathic pain 02/14/2018  . Chronic cough 07/23/2017  . Closed fracture of shaft of right radius 07/23/2017  . Hemiplegia and hemiparesis following cerebral infarction affecting left non-dominant side ([place]) 08/28/2016  . Hx of falling 08/28/2016  . Dysphagia following cerebral infarction 08/28/2016  . Dysphagia, oropharyngeal 08/28/2016  . Cognitive communication deficit 08/28/2016  . Hypertensive chronic kidney disease w stg 1-4/unsp chr kdny 08/28/2016  . Chronic kidney disease, stage III (moderate) 08/28/2016  . Vitamin B12 deficiency anemia due to intrinsic factor deficiency 08/28/2016  . Anxiety disorder 08/28/2016  . Insomnia 08/28/2016  . Major depressive disorder, single episode in full remission ([place]) 08/28/2016  . Dry eye syndrome of both lacrimal glands 08/28/2016  . Allergic rhinitis 08/28/2016  . Bell's palsy 08/28/2016  . Chronic pain 08/28/2016  . Hyperlipidemia 08/28/2016  . GERD (gastroesophageal reflux disease) 08/28/2016  . Closed compression fracture of L2 lumbar vertebra, sequela 04/03/2016  . Paroxysmal atrial fibrillation (HCC)   . Chronic obstructive pulmonary disease (COPD) ([place]) 11/09/2015  .  Benign essential HTN 10/09/2014  . OP (osteoporosis) 10/09/2014  . Addison anemia 10/09/2014  . Tobacco abuse, in remission 08/02/2014  . Nonrheumatic aortic valve insufficiency 07/07/2014  . Aneurysm, ascending aorta ([place]) 07/07/2014    Past Surgical History:  Procedure Laterality Date  . ABDOMINAL HYSTERECTOMY  1972  . AORTOILIAC BYPASS    . ASCENDING AORTIC ANEURYSM REPAIR  07/23/2014   07/2014  . BLEPHAROPLASTY    . CATARACT EXTRACTION  2005  .  GASTROSTOMY W/ FEEDING TUBE    . INTRAMEDULLARY (IM) NAIL INTERTROCHANTERIC Left 02/26/2016   Procedure: INTRAMEDULLARY (IM) NAIL INTERTROCHANTRIC;  Surgeon: Earnestine Leys, MD;  Location: [facility] ORS;  Service: Orthopedics;  Laterality: Left;  . KYPHOPLASTY N/A 08/26/2016   Procedure: KYPHOPLASTY T12;  Surgeon: Hessie Knows, MD;  Location: [facility] ORS;  Service: Orthopedics;  Laterality: N/A;  . KYPHOPLASTY N/A 06/17/2018   Procedure: L5 KYPHOPLASTY;  Surgeon: Hessie Knows, MD;  Location: [facility] ORS;  Service: Orthopedics;  Laterality: N/A;  . LEFT HEART CATH  06/2014  . PR ASCEND AORTIC GRAFT INCL VALVE SUSPENSION  07/07/2014   Procedure: ASCENDING AORTA GRAFT, WITH CARDIOPULMONARY BYPASS, WITH OR WITHOUT VALVE SUSPENSION; Surgeon: Dwaine Deter, MD; Location: MAIN OR [facility]; Service: Cardiothoracic  . PR LAP, GASTROTOMY W/O TUBE CONSTR  08/08/2014   Procedure: LAPAROSCOPY, SURGICAL; GASTOSTOMY W/O CONSTRUCTION OF GASTRIC TUBE (EG, STAMM PROCEDURE)(SEPARATE PROCED); Surgeon: Fredrik Rigger, MD; Location: MAIN OR [facility]; Service: Gastrointestinal  . PR PLACE PERCUT GASTROSTOMY TUBE  08/08/2014   Procedure: UGI ENDO; W/DIRECTED PLCMT PERQ GASTROSTOMY TUBE; Surgeon: Fredrik Rigger, MD; Location: MAIN OR [facility]; Service: Gastrointestinal    Prior to Admission medications   Medication Sig Start Date End Date Taking? Authorizing Provider  alum & mag hydroxide-simeth (MAALOX PLUS) 400-400-40 MG/5ML suspension Take 30 mLs by mouth every 4 (four) hours as needed.   Yes [provider]  BREO ELLIPTA 100-25 MCG/INH AEPB Inhale 1 puff into the lungs daily. Rinse mouth well after use 08/18/16  Yes [provider]  ipratropium-albuterol (DUONEB) 0.5-2.5 (3) MG/3ML SOLN Take 3 mLs by nebulization 3 (three) times daily.    Yes [provider]  Lidocaine 4 % PTCH Apply 1 patch daily to right Flank / sore ribs.  Remove after 12 hours   Yes [provider]  loperamide  (IMODIUM) 2 MG capsule Take 2 mg by mouth as needed for diarrhea or loose stools.   Yes [provider]  magnesium hydroxide (MILK OF MAGNESIA) 400 MG/5ML suspension Take 30 mLs by mouth daily as needed for mild constipation.   Yes [provider]  methocarbamol (ROBAXIN) 500 MG tablet Take 1 tablet (500 mg total) by mouth every 8 (eight) hours as needed for muscle spasms. 07/19/18  Yes Medina-Vargas, Monina C, NP  pantoprazole (PROTONIX) 40 MG tablet Take 40 mg by mouth daily.   Yes [provider]  Polyethyl Glycol-Propyl Glycol (SYSTANE) 0.4-0.3 % GEL ophthalmic gel Place 1 application into both eyes at bedtime. (2 drops ) for dry eyes (Ointments are on Backorder)    Yes [provider]  acetaminophen (TYLENOL) 325 MG tablet Take 2 tablets (650 mg total) by mouth 2 (two) times daily. 08/02/18   Medina-Vargas, Monina C, NP  albuterol (PROVENTIL HFA;VENTOLIN HFA) 108 (90 Base) MCG/ACT inhaler Inhale 2 puffs into the lungs every 4 (four) hours as needed for wheezing or shortness of breath. 11/09/15   Flora Lipps, MD  aspirin EC 81 MG tablet Take 81 mg by mouth daily.  [provider]  carboxymethylcellulose (REFRESH TEARS) 0.5 % SOLN Place 2 drops into both eyes. 1 - 4 times daily as needed    [provider]  diclofenac sodium (VOLTAREN) 1 % GEL Apply 4 g topically 3 (three) times daily as needed. Apply to bilateral hips and knees every 8 hours as needed for pain 03/18/18   [provider]  diltiazem (CARDIZEM) 60 MG tablet Take 60 mg by mouth daily.    [provider]  docusate sodium (COLACE) 100 MG capsule Take 1 capsule (100 mg total) by mouth 2 (two) times daily. 03/01/16   Fritzi Mandes, MD  gabapentin (NEURONTIN) 300 MG capsule Take 300 mg by mouth every 12 (twelve) hours.  06/07/18   [provider]  montelukast (SINGULAIR) 10 MG tablet Take 10 mg by mouth at bedtime.     [provider]  oxyCODONE  (ROXICODONE) 5 MG immediate release tablet Take 1 tablet (5 mg total) by mouth every 12 (twelve) hours as needed for moderate pain or severe pain. 08/02/18   Medina-Vargas, Monina C, NP  OXYGEN Inhale 2 L into the lungs continuous as needed.    [provider]  polyethylene glycol (MIRALAX / GLYCOLAX) packet Take 17 g by mouth 2 (two) times daily as needed.    [provider]  potassium chloride (K-DUR,KLOR-CON) 10 MEQ tablet Take 10 mEq by mouth daily.  03/05/18   [provider]  predniSONE (DELTASONE) 5 MG tablet Take 5 mg by mouth daily with breakfast.  04/15/18   [provider]  senna-docusate (SENNA-PLUS) 8.6-50 MG tablet Take 2 tablets by mouth 2 (two) times daily.     [provider]  sertraline (ZOLOFT) 100 MG tablet Take 100 mg by mouth daily.     [provider]  sertraline (ZOLOFT) 50 MG tablet Take 50 mg by mouth daily.     [provider]  Skin Protectants, Misc. (MINERIN) CREA Apply liberal amount daily to arms and legs after the bath and PRN for skin integrity    [provider]  sodium phosphate Pediatric (FLEET) 3.5-9.5 GM/59ML enema Place 1 enema rectally once as needed for severe constipation. Take once every 3 days if constipation is not relieved by Milk of magnesia or Bisacodyl Suppository    [provider]  torsemide (DEMADEX) 10 MG tablet Take 10 mg by mouth daily.  02/16/18   [provider]  UNABLE TO FIND Diet Type: NAS    [provider]    Allergies Iodinated diagnostic agents, Nitrofurantoin, Omnipaque [iohexol], Solifenacin, Ciprofloxacin, Metronidazole, and Sulfa antibiotics  Family History  Problem Relation Age of Onset  . Lung cancer Son   . CAD Father   . Heart attack Father   . CAD Brother   . Heart attack Brother   . Stroke Mother     Social History Social History   Tobacco Use  . Smoking status: Former Smoker    Packs/day: 0.50    Years: 50.00     Pack years: 25.00    Types: Cigarettes    Quit date: 05/30/2014    Years since quitting: 4.7  . Smokeless tobacco: Never Used  Substance Use Topics  . Alcohol use: No    Comment: occasionally  . Drug use: No    Review of Systems  Constitutional: No fever/chills Eyes: No visual changes. ENT: No sore throat. Cardiovascular: Denies chest pain. Respiratory: Denies shortness of breath. Gastrointestinal: No abdominal pain.  No nausea, no vomiting.  No diarrhea.  No constipation. Genitourinary: Negative for dysuria. Musculoskeletal: Negative for back pain. Skin: Negative for rash. Neurological: Negative for headaches, focal weakness   ____________________________________________   PHYSICAL EXAM:  VITAL SIGNS: ED Triage Vitals  Enc Vitals Group     BP 02/11/19 1731 (!) 122/57     Pulse Rate 02/11/19 1731 75     Resp 02/11/19 1731 18     Temp 02/11/19 1731 98.3 F (36.8 C)     Temp Source 02/11/19 1731 Oral     SpO2 02/11/19 1731 95 %     Weight --      Height --      Head Circumference --      Peak Flow --      Pain Score 02/11/19 1736 7     Pain Loc --      Pain Edu? --      Excl. in [place]? --     Constitutional: Alert and oriented. Well appearing and in no acute distress. Eyes: Conjunctivae are normal.  Head: Atraumatic. Nose: No congestion/rhinnorhea. Mouth/Throat: Mucous membranes are moist.  Oropharynx non-erythematous. Neck: No stridor.  Cardiovascular: Normal rate, regular rhythm. Grossly normal heart sounds.  Good peripheral circulation. Respiratory: Normal respiratory effort.  No retractions. Lungs CTAB. Gastrointestinal: Soft and nontender. No distention. No abdominal bruits. No CVA tenderness. Musculoskeletal: Left shin with an old bruise anteriorly going down both sides especially laterally.  Middle of the shin there is a quarter sized area where the skin has broken and there is some soft tissue swelling present.  There is no sign of infection no pus or  erythema no foul odor. Neurologic:  Normal speech and language.  Skin:  Skin is warm, dry and intact except as noted above. No rash noted.   ____________________________________________   LABS (all labs ordered are listed, but only abnormal results are displayed)  Labs Reviewed - No data to display ____________________________________________  EKG   ____________________________________________  RADIOLOGY  ED MD interpretation: X-ray read by radiology reviewed by me shows no acute fracture  Official radiology report(s): Dg Tibia/fibula Left  Result Date: 02/11/2019 CLINICAL DATA:  Pain and swelling after injury. EXAM: LEFT TIBIA AND FIBULA - 2 VIEW COMPARISON:  None. FINDINGS: There is diffuse osteopenia of the left lower extremity osseous structures. There is nonspecific soft tissue swelling about the left lower extremity. Vascular calcifications are noted. There is no acute displaced fracture or dislocation. IMPRESSION: 1. No acute osseous abnormality. 2. Nonspecific lower extremity edema is noted. Electronically Signed   By: Constance Holster M.D.   On: 02/11/2019 19:05    ____________________________________________   PROCEDURES  Procedure(s) performed (including Critical Care):  Procedures   ____________________________________________   INITIAL IMPRESSION / ASSESSMENT AND PLAN / ED COURSE  Ana Schultz was evaluated in Emergency Department on 02/11/2019 for the symptoms described in the history of present illness. She was evaluated in the context of the global COVID-19 pandemic, which necessitated consideration that the patient might be at risk for infection with the SARS-CoV-2 virus that causes COVID-19. Institutional protocols and algorithms that pertain to the evaluation of patients at risk for COVID-19 are in a state of rapid change based on information released by regulatory bodies including the CDC and federal and state organizations. These policies and  algorithms were followed during the patient's care in the ED.    High ultrasound of the area.  There is no evidence of drainable blood collection under the skin.  We will dressed  the wound keep it covered clean and dry.  We will watch her for infection.  I will have her follow-up with the wound care center because at her age it will take quite some time for this to heal properly.         ____________________________________________   FINAL CLINICAL IMPRESSION(S) / ED DIAGNOSES  Final diagnoses:  Leg wound, left, initial encounter     ED Discharge Orders    None       Note:  This document was prepared using Dragon voice recognition software and may include unintentional dictation errors.    Nena Polio, MD 02/11/19 [place]

## 2019-02-11 NOTE — Discharge Instructions (Addendum)
Change dressing daily.  Use antibiotic ointment and nonstick gauze pads,have patient follow-up with the wound care center/wound clinic for further treatment.  Return for any signs of infection including pus increasing pain redness swelling or streaking up the leg.

## 2019-02-11 NOTE — ED Triage Notes (Signed)
Pt from [facility] via EMS. Pt had fall x1wk ago and had bruising to LLE, staff states that today noted open wound around bruising. Pt c/o pain to site. PT is A&OX4, VSS

## 2019-02-11 NOTE — ED Notes (Signed)
EMS is present for transfer.

## 2019-02-11 NOTE — ED Notes (Signed)
Called and provided report to [place] at [facility] and updated her on pt's status and discharge instructions for continued wound care. Informed her that we are just waiting on EMS transport for pt.

## 2019-02-11 NOTE — ED Notes (Signed)
Pt has LFT side weakness from hx of CVA

## 2019-02-11 NOTE — ED Notes (Signed)
Patient assisted to restroom by this RN

## 2019-02-11 NOTE — ED Notes (Signed)
Patient's wound dressed with Neosporin and non-adherent gauze, wrapped with ACE bandage

## 2019-10-14 ENCOUNTER — Emergency Department: Payer: Medicare Other

## 2019-10-14 ENCOUNTER — Other Ambulatory Visit: Payer: Self-pay

## 2019-10-14 ENCOUNTER — Emergency Department
Admission: EM | Admit: 2019-10-14 | Discharge: 2019-10-14 | Disposition: A | Discharge status: Home / Self Care | Payer: Medicare Other | Attending: Emergency Medicine | Admitting: Emergency Medicine

## 2019-10-14 DIAGNOSIS — I2510 Atherosclerotic heart disease of native coronary artery without angina pectoris: Secondary | ICD-10-CM | POA: Insufficient documentation

## 2019-10-14 DIAGNOSIS — S82142A Displaced bicondylar fracture of left tibia, initial encounter for closed fracture: Secondary | ICD-10-CM | POA: Diagnosis not present

## 2019-10-14 DIAGNOSIS — Y999 Unspecified external cause status: Secondary | ICD-10-CM | POA: Diagnosis not present

## 2019-10-14 DIAGNOSIS — Y9389 Activity, other specified: Secondary | ICD-10-CM | POA: Insufficient documentation

## 2019-10-14 DIAGNOSIS — I10 Essential (primary) hypertension: Secondary | ICD-10-CM | POA: Insufficient documentation

## 2019-10-14 DIAGNOSIS — I480 Paroxysmal atrial fibrillation: Secondary | ICD-10-CM | POA: Insufficient documentation

## 2019-10-14 DIAGNOSIS — W06XXXA Fall from bed, initial encounter: Secondary | ICD-10-CM | POA: Insufficient documentation

## 2019-10-14 DIAGNOSIS — Y92013 Bedroom of single-family (private) house as the place of occurrence of the external cause: Secondary | ICD-10-CM | POA: Insufficient documentation

## 2019-10-14 DIAGNOSIS — J449 Chronic obstructive pulmonary disease, unspecified: Secondary | ICD-10-CM | POA: Diagnosis not present

## 2019-10-14 DIAGNOSIS — Z87891 Personal history of nicotine dependence: Secondary | ICD-10-CM | POA: Insufficient documentation

## 2019-10-14 DIAGNOSIS — W19XXXA Unspecified fall, initial encounter: Secondary | ICD-10-CM

## 2019-10-14 DIAGNOSIS — S0990XA Unspecified injury of head, initial encounter: Secondary | ICD-10-CM | POA: Insufficient documentation

## 2019-10-14 DIAGNOSIS — S8992XA Unspecified injury of left lower leg, initial encounter: Secondary | ICD-10-CM | POA: Diagnosis present

## 2019-10-14 DIAGNOSIS — M25562 Pain in left knee: Secondary | ICD-10-CM

## 2019-10-14 DIAGNOSIS — Z79899 Other long term (current) drug therapy: Secondary | ICD-10-CM | POA: Diagnosis not present

## 2019-10-14 MED ORDER — OXYCODONE HCL 5 MG PO TABS: 5.0000 mg | ORAL_TABLET | Freq: Four times a day (QID) | ORAL | 0 refills | Status: AC | PRN

## 2019-10-14 MED ORDER — OXYCODONE HCL 5 MG PO TABS
5.0000 mg | ORAL_TABLET | Freq: Once | ORAL | Status: AC
  Administered 2019-10-14: 5 mg via ORAL
  Filled 2019-10-14: qty 1

## 2019-10-14 NOTE — ED Provider Notes (Signed)
[facility] Emergency Department Provider Note   ____________________________________________   First MD Initiated Contact with Patient 10/14/19 [phone]     (approximate)  I have reviewed the triage vital signs and the nursing notes.   HISTORY  Chief Complaint Fall    HPI Ana Schultz is a 86 y.o. female with possible history of CAD, A. fib, COPD, hypertension, and stroke with left-sided deficits who presents to the ED following fall.  Patient reports that she lost her balance while getting up out of bed to go to the bathroom this morning.  She fell onto her left side, does not think she hit her head or lost consciousness.  She struck her left knee and left elbow, after the fall had difficulty getting up off of the ground.  She denies any headache, neck pain, chest pain, or abdominal pain.  She denies any lightheadedness, dizziness, or feeling like she was going to pass out.        Past Medical History:  Diagnosis Date  . Adenoma of rectum   . Adrenal adenoma   . Anemia   . Anemia, unspecified   . Arthritis   . Ascending aortic aneurysm ([place])    a. s/p repair 07/2014  . Atrophic vaginitis   . Benign neoplasm of colon, unspecified   . COPD (chronic obstructive pulmonary disease) ([place])   . Coronary artery disease, non-occlusive    a. LHC 06/2014 without evidence of obstructive disease  . Depression   . Diverticulosis   . Dysphagia   . Dysrhythmia   . Essential hypertension    benign  . Frequent UTI   . GERD (gastroesophageal reflux disease)   . Hemiplegia and hemiparesis    following cerebral infarction affecting left non-dominant side  . Hypertension   . Microscopic hematuria   . Nonrheumatic aortic valve insufficiency   . Osteoporosis    unspecified  . Panic attacks   . Paroxysmal atrial fibrillation (HCC)   . Pernicious anemia   . Renal cyst    CKD STAGE 3  . Stroke ([place]) 07/23/2014   a. post-operative setting in 07/2014 leading to  left UE hemiapresis and left LE weakness  . Urge incontinence     Patient Active Problem List   Diagnosis Date Noted  . Closed wedge compression fracture of L5 vertebra with routine healing 06/20/2018  . Constipation due to opioid therapy 06/13/2018  . Shingles outbreak 03/23/2018  . Chronic constipation 02/21/2018  . Chronic back pain 02/21/2018  . Chronic cerebrovascular accident (CVA) 02/21/2018  . Hypokalemia 02/21/2018  . Neuropathic pain 02/14/2018  . Chronic cough 07/23/2017  . Closed fracture of shaft of right radius 07/23/2017  . Hemiplegia and hemiparesis following cerebral infarction affecting left non-dominant side ([place]) 08/28/2016  . Hx of falling 08/28/2016  . Dysphagia following cerebral infarction 08/28/2016  . Dysphagia, oropharyngeal 08/28/2016  . Cognitive communication deficit 08/28/2016  . Hypertensive chronic kidney disease w stg 1-4/unsp chr kdny 08/28/2016  . Chronic kidney disease, stage III (moderate) 08/28/2016  . Vitamin B12 deficiency anemia due to intrinsic factor deficiency 08/28/2016  . Anxiety disorder 08/28/2016  . Insomnia 08/28/2016  . Major depressive disorder, single episode in full remission ([place]) 08/28/2016  . Dry eye syndrome of both lacrimal glands 08/28/2016  . Allergic rhinitis 08/28/2016  . Bell's palsy 08/28/2016  . Chronic pain 08/28/2016  . Hyperlipidemia 08/28/2016  . GERD (gastroesophageal reflux disease) 08/28/2016  . Closed compression fracture of L2 lumbar vertebra, sequela 04/03/2016  .  Paroxysmal atrial fibrillation (HCC)   . Chronic obstructive pulmonary disease (COPD) ([place]) 11/09/2015  . Benign essential HTN 10/09/2014  . OP (osteoporosis) 10/09/2014  . Addison anemia 10/09/2014  . Tobacco abuse, in remission 08/02/2014  . Nonrheumatic aortic valve insufficiency 07/07/2014  . Aneurysm, ascending aorta ([place]) 07/07/2014    Past Surgical History:  Procedure Laterality Date  . ABDOMINAL HYSTERECTOMY  1972  .  AORTOILIAC BYPASS    . ASCENDING AORTIC ANEURYSM REPAIR  07/23/2014   07/2014  . BLEPHAROPLASTY    . CATARACT EXTRACTION  2005  . GASTROSTOMY W/ FEEDING TUBE    . INTRAMEDULLARY (IM) NAIL INTERTROCHANTERIC Left 02/26/2016   Procedure: INTRAMEDULLARY (IM) NAIL INTERTROCHANTRIC;  Surgeon: Earnestine Leys, MD;  Location: [facility] ORS;  Service: Orthopedics;  Laterality: Left;  . KYPHOPLASTY N/A 08/26/2016   Procedure: KYPHOPLASTY T12;  Surgeon: Hessie Knows, MD;  Location: [facility] ORS;  Service: Orthopedics;  Laterality: N/A;  . KYPHOPLASTY N/A 06/17/2018   Procedure: L5 KYPHOPLASTY;  Surgeon: Hessie Knows, MD;  Location: [facility] ORS;  Service: Orthopedics;  Laterality: N/A;  . LEFT HEART CATH  06/2014  . PR ASCEND AORTIC GRAFT INCL VALVE SUSPENSION  07/07/2014   Procedure: ASCENDING AORTA GRAFT, WITH CARDIOPULMONARY BYPASS, WITH OR WITHOUT VALVE SUSPENSION; Surgeon: Dwaine Deter, MD; Location: [facility]; Service: Cardiothoracic  . PR LAP, GASTROTOMY W/O TUBE CONSTR  08/08/2014   Procedure: LAPAROSCOPY, SURGICAL; GASTOSTOMY W/O CONSTRUCTION OF GASTRIC TUBE (EG, STAMM PROCEDURE)(SEPARATE PROCED); Surgeon: Fredrik Rigger, MD; Location: MAIN OR [facility]; Service: Gastrointestinal  . PR PLACE PERCUT GASTROSTOMY TUBE  08/08/2014   Procedure: UGI ENDO; W/DIRECTED PLCMT PERQ GASTROSTOMY TUBE; Surgeon: Fredrik Rigger, MD; Location: MAIN OR [facility]; Service: Gastrointestinal    Prior to Admission medications   Medication Sig Start Date End Date Taking? Authorizing Provider  acetaminophen (TYLENOL) 325 MG tablet Take 2 tablets (650 mg total) by mouth 2 (two) times daily. 08/02/18   Medina-Vargas, Monina C, NP  albuterol (PROVENTIL HFA;VENTOLIN HFA) 108 (90 Base) MCG/ACT inhaler Inhale 2 puffs into the lungs every 4 (four) hours as needed for wheezing or shortness of breath. 11/09/15   Flora Lipps, MD  alum & mag hydroxide-simeth (MAALOX PLUS) 400-400-40 MG/5ML suspension Take 30 mLs by mouth every 4  (four) hours as needed.    [provider]  aspirin EC 81 MG tablet Take 81 mg by mouth daily.    [provider]  BREO ELLIPTA 100-25 MCG/INH AEPB Inhale 1 puff into the lungs daily. Rinse mouth well after use 08/18/16   [provider]  carboxymethylcellulose (REFRESH TEARS) 0.5 % SOLN Place 2 drops into both eyes. 1 - 4 times daily as needed    [provider]  diclofenac sodium (VOLTAREN) 1 % GEL Apply 4 g topically 3 (three) times daily as needed. Apply to bilateral hips and knees every 8 hours as needed for pain 03/18/18   [provider]  diltiazem (CARDIZEM) 60 MG tablet Take 60 mg by mouth daily.    [provider]  docusate sodium (COLACE) 100 MG capsule Take 1 capsule (100 mg total) by mouth 2 (two) times daily. 03/01/16   Fritzi Mandes, MD  gabapentin (NEURONTIN) 300 MG capsule Take 300 mg by mouth every 12 (twelve) hours.  06/07/18   [provider]  ipratropium-albuterol (DUONEB) 0.5-2.5 (3) MG/3ML SOLN Take 3 mLs by nebulization 3 (three) times daily.     [provider]  Lidocaine 4 % PTCH Apply 1 patch daily to right  Flank / sore ribs.  Remove after 12 hours    [provider]  loperamide (IMODIUM) 2 MG capsule Take 2 mg by mouth as needed for diarrhea or loose stools.    [provider]  magnesium hydroxide (MILK OF MAGNESIA) 400 MG/5ML suspension Take 30 mLs by mouth daily as needed for mild constipation.    [provider]  methocarbamol (ROBAXIN) 500 MG tablet Take 1 tablet (500 mg total) by mouth every 8 (eight) hours as needed for muscle spasms. 07/19/18   Medina-Vargas, Monina C, NP  montelukast (SINGULAIR) 10 MG tablet Take 10 mg by mouth at bedtime.     [provider]  oxyCODONE (ROXICODONE) 5 MG immediate release tablet Take 1 tablet (5 mg total) by mouth every 12 (twelve) hours as needed for moderate pain or severe pain. 08/02/18   Medina-Vargas, Monina C, NP  OXYGEN Inhale  2 L into the lungs continuous as needed.    [provider]  pantoprazole (PROTONIX) 40 MG tablet Take 40 mg by mouth daily.    [provider]  Polyethyl Glycol-Propyl Glycol (SYSTANE) 0.4-0.3 % GEL ophthalmic gel Place 1 application into both eyes at bedtime. (2 drops ) for dry eyes (Ointments are on Backorder)     [provider]  polyethylene glycol (MIRALAX / GLYCOLAX) packet Take 17 g by mouth 2 (two) times daily as needed.    [provider]  potassium chloride (K-DUR,KLOR-CON) 10 MEQ tablet Take 10 mEq by mouth daily.  03/05/18   [provider]  predniSONE (DELTASONE) 5 MG tablet Take 5 mg by mouth daily with breakfast.  04/15/18   [provider]  senna-docusate (SENNA-PLUS) 8.6-50 MG tablet Take 2 tablets by mouth 2 (two) times daily.     [provider]  sertraline (ZOLOFT) 100 MG tablet Take 100 mg by mouth daily.     [provider]  sertraline (ZOLOFT) 50 MG tablet Take 50 mg by mouth daily.     [provider]  Skin Protectants, Misc. (MINERIN) CREA Apply liberal amount daily to arms and legs after the bath and PRN for skin integrity    [provider]  sodium phosphate Pediatric (FLEET) 3.5-9.5 GM/59ML enema Place 1 enema rectally once as needed for severe constipation. Take once every 3 days if constipation is not relieved by Milk of magnesia or Bisacodyl Suppository    [provider]  torsemide (DEMADEX) 10 MG tablet Take 10 mg by mouth daily.  02/16/18   [provider]  UNABLE TO FIND Diet Type: NAS    [provider]    Allergies Iodinated diagnostic agents, Nitrofurantoin, Omnipaque [iohexol], Solifenacin, Ciprofloxacin, Metronidazole, and Sulfa antibiotics  Family History  Problem Relation Age of Onset  . Lung cancer Son   . CAD Father   . Heart attack Father   . CAD Brother   . Heart attack Brother   . Stroke Mother     Social History Social  History   Tobacco Use  . Smoking status: Former Smoker    Packs/day: 0.50    Years: 50.00    Pack years: 25.00    Types: Cigarettes    Quit date: 05/30/2014    Years since quitting: 5.3  . Smokeless tobacco: Never Used  Vaping Use  . Vaping Use: Never used  Substance Use Topics  . Alcohol use: No    Comment: occasionally  . Drug use: No    Review of Systems  Constitutional: No  fever/chills Eyes: No visual changes. ENT: No sore throat. Cardiovascular: Denies chest pain. Respiratory: Denies shortness of breath. Gastrointestinal: No abdominal pain.  No nausea, no vomiting.  No diarrhea.  No constipation. Genitourinary: Negative for dysuria. Musculoskeletal: Negative for back pain.  Positive for left elbow pain and left knee pain. Skin: Negative for rash. Neurological: Negative for headaches, focal weakness or numbness.  ____________________________________________   PHYSICAL EXAM:  VITAL SIGNS: ED Triage Vitals  Enc Vitals Group     BP      Pulse      Resp      Temp      Temp src      SpO2      Weight      Height      Head Circumference      Peak Flow      Pain Score      Pain Loc      Pain Edu?      Excl. in [place]?     Constitutional: Alert and oriented. Eyes: Conjunctivae are normal. Head: Atraumatic. Nose: No congestion/rhinnorhea. Mouth/Throat: Mucous membranes are moist. Neck: Normal ROM, no midline cervical spine tenderness. Cardiovascular: Normal rate, regular rhythm. Grossly normal heart sounds. Respiratory: Normal respiratory effort.  No retractions. Lungs CTAB. Gastrointestinal: Soft and nontender. No distention. Genitourinary: deferred Musculoskeletal: No lower extremity tenderness nor edema.  Diffuse tenderness to left hip, left knee, and left elbow. Neurologic:  Normal speech and language.  No new focal deficits noted, left-sided deficits at patient's reported baseline. Skin:  Skin is warm, dry and intact. No rash noted.  Skin tears to left  elbow and left knuckle. Psychiatric: Mood and affect are normal. Speech and behavior are normal.  ____________________________________________   LABS (all labs ordered are listed, but only abnormal results are displayed)  Labs Reviewed - No data to display ____________________________________________  EKG  ED ECG REPORT I, Blake Divine, the attending physician, personally viewed and interpreted this ECG.   Date: 10/14/2019  EKG Time: 6:16  Rate: 68  Rhythm: normal sinus rhythm  Axis: Normal  Intervals:right bundle branch block  ST&T Change: None   PROCEDURES  Procedure(s) performed (including Critical Care):  Procedures   ____________________________________________   INITIAL IMPRESSION / ASSESSMENT AND PLAN / ED COURSE       86 year old female with history of stroke and left-sided deficits, CAD, hypertension, A. fib (not on anticoagulation), and COPD who presents to the ED following fall.  She reports attempting to get up out of bed and losing her balance earlier this morning, struck her left elbow and left knee, but does not think she hit her head.  We will screen CT head and C-spine, also check x-rays of left elbow, left hip, and left knee.  No obvious deformities noted and she is neurovascularly intact to her upper and lower extremities.  Patient turned over to oncoming provider pending imaging results.      ____________________________________________   FINAL CLINICAL IMPRESSION(S) / ED DIAGNOSES  Final diagnoses:  Fall, initial encounter  Acute pain of left knee     ED Discharge Orders    None       Note:  This document was prepared using Dragon voice recognition software and may include unintentional dictation errors.   Blake Divine, MD 10/14/19 [phone]

## 2019-10-14 NOTE — ED Notes (Signed)
Pt transported to CT at this time.

## 2019-10-14 NOTE — ED Provider Notes (Addendum)
CT head and CS imaging w/o acute abnormality.   XR imaging reveals questionable LEFT radial head fracture, L knee suprapatellar joint effusion as well as findings suggestive of minimally displaced tibial plateau fracture. See radiology below.  She has baseline weakness on the left due to her hx of CVA. Her LUE is contractured at baseline 2/2 hx of CVA therefore unable to range at elbow. Does have moderate swelling and tenderness about the left knee. She is wheelchair bound at baseline due to her stroke (fell trying to transfer from the bed to the wheelchair).   Discussed imaging results with orthopedics, Dr. Rudene Christians, who reviewed patient's imaging, no acute interventions necessary.  In fact, she may continue to transfer as she has been, does not need to be nonweightbearing for transfers.  As such, feel patient is stable for discharge back to her facility.  Will have her follow-up with orthopedics as an outpatient.  Updated patient on results and plan of care, she voices understanding and is in agreement.  Radiology  XR knee - IMPRESSION:  1. Moderate suprapatellar joint effusion with layering  lipohemarthrosis.  2. Cortical step-off suggesting a minimally displaced fracture along  the posterolateral corner of the tibial plateau.  3. More ill-defined spurring versus possible fracture along the left  mesial aspect of the medial tibial plateau as well. Correlate for  point tenderness.  4. Evaluation limited by marked bony demineralization.  5. Atherosclerosis.   XR elbow - IMPRESSION:  1. Questionable lucency along the lateral aspect of the radial head  could reflect a small fracture.  2. No other definite fracture or acute traumatic malalignment though  assessment is complicated by extensive bony demineralization and  limited views. If there is persisting concern, consider  cross-sectional imaging.  3. Soft tissue edematous changes of the upper extremity. More focal  swelling along the medial  aspect of the proximal forearm could  reflect some contusive change.   CT head/CS - IMPRESSION:  1. No evidence of acute intracranial injury or cervical spine  fracture.  2. Remote right MCA and PCA branch infarcts. Chronic small vessel  ischemia.  3. Bulbous left MCA bifurcation, probable aneurysm.  4. Cervical spine degeneration with C1 settling and probable basilar  invagination.      Lilia Pro., MD 10/14/19 [phone]

## 2019-10-14 NOTE — ED Notes (Signed)
Pt verbalized understanding of discharge instructions and need for follow up with MD Rudene Christians. Discharge instructions sent with EMS for facility review and this RN called and gave report to [facility], RN. Pt is in NAD at this time.

## 2019-10-14 NOTE — ED Triage Notes (Signed)
Pt arrives to ED via [facility] from [facility] with c/o unwitnessed fall. EMS states pt "lost her balance" and fell when ambulating to there bathroom. Pt denies head injury, no LOC. Pt arrives with c/o left knee pain and and skin tear to the left elbow. Pt is A&O, in NAD; RR even, regular, and unlabored.

## 2019-10-14 NOTE — Discharge Instructions (Addendum)
Thank you for letting us take care of you in the emergency department today.  The imaging of your knee showed a very small tibial plateau fracture.  We discussed your x-rays with the orthopedic doctors, who stated you can continue to transfer from your wheelchair as normal.  Please follow-up with orthopedic doctors in the clinic for follow-up/recheck.  We will send you home with a short course of pain medications to use as needed, for pain you cannot first control with ibuprofen or Tylenol.  This medication can be sedating, please be aware when taking.  Please return to the ER for any new or worsening symptoms.

## 2019-10-31 ENCOUNTER — Emergency Department: Payer: Medicare Other

## 2019-10-31 ENCOUNTER — Other Ambulatory Visit: Payer: Self-pay

## 2019-10-31 ENCOUNTER — Emergency Department
Admission: EM | Admit: 2019-10-31 | Discharge: 2019-10-31 | Disposition: A | Discharge status: Home / Self Care | Payer: Medicare Other | Attending: Emergency Medicine | Admitting: Emergency Medicine

## 2019-10-31 ENCOUNTER — Encounter: Payer: Self-pay | Admitting: Emergency Medicine

## 2019-10-31 DIAGNOSIS — I2510 Atherosclerotic heart disease of native coronary artery without angina pectoris: Secondary | ICD-10-CM | POA: Diagnosis not present

## 2019-10-31 DIAGNOSIS — N183 Chronic kidney disease, stage 3 unspecified: Secondary | ICD-10-CM | POA: Insufficient documentation

## 2019-10-31 DIAGNOSIS — Y929 Unspecified place or not applicable: Secondary | ICD-10-CM | POA: Diagnosis not present

## 2019-10-31 DIAGNOSIS — W010XXA Fall on same level from slipping, tripping and stumbling without subsequent striking against object, initial encounter: Secondary | ICD-10-CM | POA: Diagnosis not present

## 2019-10-31 DIAGNOSIS — I129 Hypertensive chronic kidney disease with stage 1 through stage 4 chronic kidney disease, or unspecified chronic kidney disease: Secondary | ICD-10-CM | POA: Insufficient documentation

## 2019-10-31 DIAGNOSIS — Y999 Unspecified external cause status: Secondary | ICD-10-CM | POA: Diagnosis not present

## 2019-10-31 DIAGNOSIS — S51812A Laceration without foreign body of left forearm, initial encounter: Secondary | ICD-10-CM | POA: Insufficient documentation

## 2019-10-31 DIAGNOSIS — Y9389 Activity, other specified: Secondary | ICD-10-CM | POA: Diagnosis not present

## 2019-10-31 DIAGNOSIS — W19XXXA Unspecified fall, initial encounter: Secondary | ICD-10-CM

## 2019-10-31 DIAGNOSIS — S0993XA Unspecified injury of face, initial encounter: Secondary | ICD-10-CM | POA: Diagnosis present

## 2019-10-31 DIAGNOSIS — S21112A Laceration without foreign body of left front wall of thorax without penetration into thoracic cavity, initial encounter: Secondary | ICD-10-CM | POA: Diagnosis not present

## 2019-10-31 DIAGNOSIS — T148XXA Other injury of unspecified body region, initial encounter: Secondary | ICD-10-CM

## 2019-10-31 DIAGNOSIS — J449 Chronic obstructive pulmonary disease, unspecified: Secondary | ICD-10-CM | POA: Insufficient documentation

## 2019-10-31 DIAGNOSIS — Z87891 Personal history of nicotine dependence: Secondary | ICD-10-CM | POA: Diagnosis not present

## 2019-10-31 DIAGNOSIS — S0181XA Laceration without foreign body of other part of head, initial encounter: Secondary | ICD-10-CM | POA: Insufficient documentation

## 2019-10-31 LAB — COMPREHENSIVE METABOLIC PANEL
ALT: 11 U/L (ref 0–44)
AST: 16 U/L (ref 15–41)
Albumin: 3.9 g/dL (ref 3.5–5.0)
Alkaline Phosphatase: 102 U/L (ref 38–126)
Anion gap: 9 (ref 5–15)
BUN: 28 mg/dL — ABNORMAL HIGH (ref 8–23)
CO2: 29 mmol/L (ref 22–32)
Calcium: 8.7 mg/dL — ABNORMAL LOW (ref 8.9–10.3)
Chloride: 102 mmol/L (ref 98–111)
Creatinine, Ser: 1.26 mg/dL — ABNORMAL HIGH (ref 0.44–1.00)
GFR calc Af Amer: 43 mL/min — ABNORMAL LOW (ref >60)
GFR calc non Af Amer: 37 mL/min — ABNORMAL LOW (ref >60)
Glucose, Bld: 107 mg/dL — ABNORMAL HIGH (ref 70–99)
Potassium: 4.5 mmol/L (ref 3.5–5.1)
Sodium: 140 mmol/L (ref 135–145)
Total Bilirubin: 0.7 mg/dL (ref 0.3–1.2)
Total Protein: 6.2 g/dL — ABNORMAL LOW (ref 6.5–8.1)

## 2019-10-31 LAB — URINALYSIS, COMPLETE (UACMP) WITH MICROSCOPIC
Bacteria, UA: NONE SEEN
Bilirubin Urine: NEGATIVE
Glucose, UA: NEGATIVE mg/dL
Hgb urine dipstick: NEGATIVE
Ketones, ur: NEGATIVE mg/dL
Leukocytes,Ua: NEGATIVE
Nitrite: NEGATIVE
Protein, ur: NEGATIVE mg/dL
Specific Gravity, Urine: 1.009 (ref 1.005–1.030)
pH: 6 (ref 5.0–8.0)

## 2019-10-31 LAB — CBC WITH DIFFERENTIAL/PLATELET
Abs Immature Granulocytes: 0.06 10*3/uL (ref 0.00–0.07)
Basophils Absolute: 0 10*3/uL (ref 0.0–0.1)
Basophils Relative: 1 %
Eosinophils Absolute: 0 10*3/uL (ref 0.0–0.5)
Eosinophils Relative: 1 %
HCT: 30.2 % — ABNORMAL LOW (ref 36.0–46.0)
Hemoglobin: 10 g/dL — ABNORMAL LOW (ref 12.0–15.0)
Immature Granulocytes: 1 %
Lymphocytes Relative: 26 %
Lymphs Abs: 1.2 10*3/uL (ref 0.7–4.0)
MCH: 29.7 pg (ref 26.0–34.0)
MCHC: 33.1 g/dL (ref 30.0–36.0)
MCV: 89.6 fL (ref 80.0–100.0)
Monocytes Absolute: 0.3 10*3/uL (ref 0.1–1.0)
Monocytes Relative: 7 %
Neutro Abs: 2.9 10*3/uL (ref 1.7–7.7)
Neutrophils Relative %: 64 %
Platelets: 229 10*3/uL (ref 150–400)
RBC: 3.37 MIL/uL — ABNORMAL LOW (ref 3.87–5.11)
RDW: 14.5 % (ref 11.5–15.5)
WBC: 4.5 10*3/uL (ref 4.0–10.5)
nRBC: 0 % (ref 0.0–0.2)

## 2019-10-31 MED ORDER — OXYCODONE-ACETAMINOPHEN 5-325 MG PO TABS
1.0000 | ORAL_TABLET | Freq: Once | ORAL | Status: AC
  Administered 2019-10-31: 1 via ORAL
  Filled 2019-10-31: qty 1

## 2019-10-31 NOTE — ED Triage Notes (Signed)
Pt via ems from [facility] after falling in the bathroom. Pt states she did not call for help and got tangled up in her pants and fell. Denies loc and head injury, although she does have a skin tear on the lower side of her face, which she says may have come from contact with her wheelchair. Pt also had skin tears to left chest and left forearm. Pt alert & oriented, states the majority of her pain is from her left knee, which is fractured from a fall last week. NAD noted.

## 2019-10-31 NOTE — ED Provider Notes (Signed)
[facility] Emergency Department Provider Note  ____________________________________________  Time seen: Approximately 4:07 PM  I have reviewed the triage vital signs and the nursing notes.   HISTORY  Chief Complaint Fall    HPI Ana Schultz is a 86 y.o. female who presents the emergency department via EMS status post a fall today.  Patient states that she was in the restroom, attempted to stand before calling for help that tangled in her pants and fell.  Patient initially denied any complaints.  Patient did have a skin tear to the left chin, left chest, left forearm.  When asked specifically she does endorse pain in these areas.  She states that she did not hit her head does not know how she sustained a skin tear to the chin.  She does not believe that she lost consciousness but again is unsure.  She denies any headache, visual changes, chest pain, shortness of breath, musculoskeletal pain.  Patient had a fall a week ago and sustained a left knee fracture.  When asked specifically about this patient states that it was hurting.  She is unable to specify whether this is more painful than prior to the second fall.  Patient has a chronic medical history as described below.  No complaints of chronic medical problems at this time.         Past Medical History:  Diagnosis Date  . Adenoma of rectum   . Adrenal adenoma   . Anemia   . Anemia, unspecified   . Arthritis   . Ascending aortic aneurysm ([place])    a. s/p repair 07/2014  . Atrophic vaginitis   . Benign neoplasm of colon, unspecified   . COPD (chronic obstructive pulmonary disease) ([place])   . Coronary artery disease, non-occlusive    a. LHC 06/2014 without evidence of obstructive disease  . Depression   . Diverticulosis   . Dysphagia   . Dysrhythmia   . Essential hypertension    benign  . Frequent UTI   . GERD (gastroesophageal reflux disease)   . Hemiplegia and hemiparesis    following  cerebral infarction affecting left non-dominant side  . Hypertension   . Microscopic hematuria   . Nonrheumatic aortic valve insufficiency   . Osteoporosis    unspecified  . Panic attacks   . Paroxysmal atrial fibrillation (HCC)   . Pernicious anemia   . Renal cyst    CKD STAGE 3  . Stroke ([place]) 07/23/2014   a. post-operative setting in 07/2014 leading to left UE hemiapresis and left LE weakness  . Urge incontinence     Patient Active Problem List   Diagnosis Date Noted  . Closed wedge compression fracture of L5 vertebra with routine healing 06/20/2018  . Constipation due to opioid therapy 06/13/2018  . Shingles outbreak 03/23/2018  . Chronic constipation 02/21/2018  . Chronic back pain 02/21/2018  . Chronic cerebrovascular accident (CVA) 02/21/2018  . Hypokalemia 02/21/2018  . Neuropathic pain 02/14/2018  . Chronic cough 07/23/2017  . Closed fracture of shaft of right radius 07/23/2017  . Hemiplegia and hemiparesis following cerebral infarction affecting left non-dominant side ([place]) 08/28/2016  . Hx of falling 08/28/2016  . Dysphagia following cerebral infarction 08/28/2016  . Dysphagia, oropharyngeal 08/28/2016  . Cognitive communication deficit 08/28/2016  . Hypertensive chronic kidney disease w stg 1-4/unsp chr kdny 08/28/2016  . Chronic kidney disease, stage III (moderate) 08/28/2016  . Vitamin B12 deficiency anemia due to intrinsic factor deficiency 08/28/2016  . Anxiety disorder 08/28/2016  .  Insomnia 08/28/2016  . Major depressive disorder, single episode in full remission ([place]) 08/28/2016  . Dry eye syndrome of both lacrimal glands 08/28/2016  . Allergic rhinitis 08/28/2016  . Bell's palsy 08/28/2016  . Chronic pain 08/28/2016  . Hyperlipidemia 08/28/2016  . GERD (gastroesophageal reflux disease) 08/28/2016  . Closed compression fracture of L2 lumbar vertebra, sequela 04/03/2016  . Paroxysmal atrial fibrillation (HCC)   . Chronic obstructive pulmonary disease  (COPD) ([place]) 11/09/2015  . Benign essential HTN 10/09/2014  . OP (osteoporosis) 10/09/2014  . Addison anemia 10/09/2014  . Tobacco abuse, in remission 08/02/2014  . Nonrheumatic aortic valve insufficiency 07/07/2014  . Aneurysm, ascending aorta ([place]) 07/07/2014    Past Surgical History:  Procedure Laterality Date  . ABDOMINAL HYSTERECTOMY  1972  . AORTOILIAC BYPASS    . ASCENDING AORTIC ANEURYSM REPAIR  07/23/2014   07/2014  . BLEPHAROPLASTY    . CATARACT EXTRACTION  2005  . GASTROSTOMY W/ FEEDING TUBE    . INTRAMEDULLARY (IM) NAIL INTERTROCHANTERIC Left 02/26/2016   Procedure: INTRAMEDULLARY (IM) NAIL INTERTROCHANTRIC;  Surgeon: Earnestine Leys, MD;  Location: [facility] ORS;  Service: Orthopedics;  Laterality: Left;  . KYPHOPLASTY N/A 08/26/2016   Procedure: KYPHOPLASTY T12;  Surgeon: Hessie Knows, MD;  Location: [facility] ORS;  Service: Orthopedics;  Laterality: N/A;  . KYPHOPLASTY N/A 06/17/2018   Procedure: L5 KYPHOPLASTY;  Surgeon: Hessie Knows, MD;  Location: [facility] ORS;  Service: Orthopedics;  Laterality: N/A;  . LEFT HEART CATH  06/2014  . PR ASCEND AORTIC GRAFT INCL VALVE SUSPENSION  07/07/2014   Procedure: ASCENDING AORTA GRAFT, WITH CARDIOPULMONARY BYPASS, WITH OR WITHOUT VALVE SUSPENSION; Surgeon: Dwaine Deter, MD; Location[facility]; Service: Cardiothoracic  . PR LAP, GASTROTOMY W/O TUBE CONSTR  08/08/2014   Procedure: LAPAROSCOPY, SURGICAL; GASTOSTOMY W/O CONSTRUCTION OF GASTRIC TUBE (EG, STAMM PROCEDURE)(SEPARATE PROCED); Surgeon: Fredrik Rigger, MD; Location: MAIN OR [facility]; Service: Gastrointestinal  . PR PLACE PERCUT GASTROSTOMY TUBE  08/08/2014   Procedure: UGI ENDO; W/DIRECTED PLCMT PERQ GASTROSTOMY TUBE; Surgeon: Fredrik Rigger, MD; Location: MAIN OR [facility]; Service: Gastrointestinal    Prior to Admission medications   Medication Sig Start Date End Date Taking? Authorizing Provider  acetaminophen (TYLENOL) 325 MG tablet Take 2 tablets (650 mg total) by  mouth 2 (two) times daily. 08/02/18   Medina-Vargas, Monina C, NP  albuterol (PROVENTIL HFA;VENTOLIN HFA) 108 (90 Base) MCG/ACT inhaler Inhale 2 puffs into the lungs every 4 (four) hours as needed for wheezing or shortness of breath. 11/09/15   Flora Lipps, MD  alum & mag hydroxide-simeth (MAALOX PLUS) 400-400-40 MG/5ML suspension Take 30 mLs by mouth every 4 (four) hours as needed.    [provider]  aspirin EC 81 MG tablet Take 81 mg by mouth daily.    [provider]  BREO ELLIPTA 100-25 MCG/INH AEPB Inhale 1 puff into the lungs daily. Rinse mouth well after use 08/18/16   [provider]  carboxymethylcellulose (REFRESH TEARS) 0.5 % SOLN Place 2 drops into both eyes. 1 - 4 times daily as needed    [provider]  diclofenac sodium (VOLTAREN) 1 % GEL Apply 4 g topically 3 (three) times daily as needed. Apply to bilateral hips and knees every 8 hours as needed for pain 03/18/18   [provider]  diltiazem (CARDIZEM) 60 MG tablet Take 60 mg by mouth daily.    [provider]  docusate sodium (COLACE) 100 MG capsule Take 1 capsule (100 mg total) by mouth 2 (two) times  daily. 03/01/16   Fritzi Mandes, MD  gabapentin (NEURONTIN) 300 MG capsule Take 300 mg by mouth every 12 (twelve) hours.  06/07/18   [provider]  ipratropium-albuterol (DUONEB) 0.5-2.5 (3) MG/3ML SOLN Take 3 mLs by nebulization 3 (three) times daily.     [provider]  Lidocaine 4 % PTCH Apply 1 patch daily to right Flank / sore ribs.  Remove after 12 hours    [provider]  loperamide (IMODIUM) 2 MG capsule Take 2 mg by mouth as needed for diarrhea or loose stools.    [provider]  magnesium hydroxide (MILK OF MAGNESIA) 400 MG/5ML suspension Take 30 mLs by mouth daily as needed for mild constipation.    [provider]  methocarbamol (ROBAXIN) 500 MG tablet Take 1 tablet (500 mg total) by mouth every 8 (eight) hours as needed for  muscle spasms. 07/19/18   Medina-Vargas, Monina C, NP  montelukast (SINGULAIR) 10 MG tablet Take 10 mg by mouth at bedtime.     [provider]  oxyCODONE (ROXICODONE) 5 MG immediate release tablet Take 1 tablet (5 mg total) by mouth every 12 (twelve) hours as needed for moderate pain or severe pain. 08/02/18   Medina-Vargas, Monina C, NP  OXYGEN Inhale 2 L into the lungs continuous as needed.    [provider]  pantoprazole (PROTONIX) 40 MG tablet Take 40 mg by mouth daily.    [provider]  Polyethyl Glycol-Propyl Glycol (SYSTANE) 0.4-0.3 % GEL ophthalmic gel Place 1 application into both eyes at bedtime. (2 drops ) for dry eyes (Ointments are on Backorder)     [provider]  polyethylene glycol (MIRALAX / GLYCOLAX) packet Take 17 g by mouth 2 (two) times daily as needed.    [provider]  potassium chloride (K-DUR,KLOR-CON) 10 MEQ tablet Take 10 mEq by mouth daily.  03/05/18   [provider]  predniSONE (DELTASONE) 5 MG tablet Take 5 mg by mouth daily with breakfast.  04/15/18   [provider]  senna-docusate (SENNA-PLUS) 8.6-50 MG tablet Take 2 tablets by mouth 2 (two) times daily.     [provider]  sertraline (ZOLOFT) 100 MG tablet Take 100 mg by mouth daily.     [provider]  sertraline (ZOLOFT) 50 MG tablet Take 50 mg by mouth daily.     [provider]  Skin Protectants, Misc. (MINERIN) CREA Apply liberal amount daily to arms and legs after the bath and PRN for skin integrity    [provider]  sodium phosphate Pediatric (FLEET) 3.5-9.5 GM/59ML enema Place 1 enema rectally once as needed for severe constipation. Take once every 3 days if constipation is not relieved by Milk of magnesia or Bisacodyl Suppository    [provider]  torsemide (DEMADEX) 10 MG tablet Take 10 mg by mouth daily.  02/16/18   [provider]  UNABLE TO FIND Diet Type: NAS    [provider]    Allergies Iodinated diagnostic agents, Nitrofurantoin, Omnipaque [iohexol], Solifenacin, Ciprofloxacin, Metronidazole, and Sulfa antibiotics  Family History  Problem Relation Age of Onset  . Lung cancer Son   . CAD Father   . Heart attack Father   . CAD Brother   . Heart attack Brother   . Stroke Mother     Social History Social History   Tobacco Use  . Smoking status: Former Smoker    Packs/day: 0.50    Years: 50.00    Pack  years: 25.00    Types: Cigarettes    Quit date: 05/30/2014    Years since quitting: 5.4  . Smokeless tobacco: Never Used  Vaping Use  . Vaping Use: Never used  Substance Use Topics  . Alcohol use: No    Comment: occasionally  . Drug use: No     Review of Systems  Constitutional: No fever/chills Eyes: No visual changes. No discharge ENT: No upper respiratory complaints. Cardiovascular: no chest pain. Respiratory: no cough. No SOB. Gastrointestinal: No abdominal pain.  No nausea, no vomiting.  No diarrhea.  No constipation. Genitourinary: Negative for dysuria. No hematuria Musculoskeletal: Patient initially endorses no musculoskeletal pain.  Patient does endorse knee pain secondary to a fall a week ago.  She also endorses left wrist pain after palpation. Skin: Several skin tears, 1 to the left chest, 1 to the left chin, 1 to the left forearm Neurological: Negative for headaches, focal weakness or numbness. 10-point ROS otherwise negative.  ____________________________________________   PHYSICAL EXAM:  VITAL SIGNS: ED Triage Vitals  Enc Vitals Group     BP 10/31/19 1535 (!) 177/52     Pulse Rate 10/31/19 1535 77     Resp 10/31/19 1535 20     Temp 10/31/19 1535 98.8 F (37.1 C)     Temp Source 10/31/19 1535 Oral     SpO2 10/31/19 1535 97 %     Weight 10/31/19 1538 130 lb 1.1 oz (59 kg)     Height 10/31/19 1538 5\' 3"  (1.6 m)     Head Circumference --      Peak Flow --      Pain Score --      Pain Loc --      Pain  Edu? --      Excl. in [place]? --      Constitutional: Alert and oriented. Well appearing and in no acute distress. Eyes: Conjunctivae are normal. PERRL. EOMI. Head: Superficial skin tear to left chin.  No frank laceration.  No bleeding.  No foreign body.  Area is moderately edematous when compared to right side.  Patient tender to palpation over her skin tear as well as along the inferior mandible.  No palpable abnormality or crepitus.  Good range of motion to the jaw.  No other visible signs of trauma to the head or face.  No battle signs, raccoon eyes, serosanguineous fluid drainage from ears or nares. ENT:      Ears:       Nose: No congestion/rhinnorhea.      Mouth/Throat: Mucous membranes are moist.  Neck: No stridor.  No cervical spine tenderness to palpation.  Cardiovascular: Normal rate, regular rhythm. Normal S1 and S2.  Good peripheral circulation. Respiratory: Normal respiratory effort without tachypnea or retractions. Lungs CTAB. Good air entry to the bases with no decreased or absent breath sounds. Gastrointestinal: Bowel sounds 4 quadrants. Soft and nontender to palpation. No guarding or rigidity. No palpable masses. No distention. No CVA tenderness. Musculoskeletal: Full range of motion to all extremities. No gross deformities appreciated.  Patient with skin tear to left forearm.  Good range of motion to the elbow.  Patient does have somewhat limited range of motion to left wrist.  Patient is tender to palpation mildly along the radius and ulna.  No palpable abnormalities in this region.  No other tenderness to palpation.  Radial pulse intact.  Sensation intact all digits.  Examination of the left knee reveals no deformity.  Patient is mildly tender grossly  over the anterior aspect without point specific tenderness.  No palpable abnormality.  No ballottement.  Special tests are negative.  Dorsalis pedis pulses sensation intact bilateral lower extremities. Neurologic:  Normal speech and  language. No gross focal neurologic deficits are appreciated.  Skin:  Skin is warm, dry and intact. No rash noted. Psychiatric: Mood and affect are normal. Speech and behavior are normal. Patient exhibits appropriate insight and judgement.   ____________________________________________   LABS (all labs ordered are listed, but only abnormal results are displayed)  Labs Reviewed  URINALYSIS, COMPLETE (UACMP) WITH MICROSCOPIC - Abnormal; Notable for the following components:      Result Value   Color, Urine STRAW (*)    APPearance CLEAR (*)    All other components within normal limits  CBC WITH DIFFERENTIAL/PLATELET - Abnormal; Notable for the following components:   RBC 3.37 (*)    Hemoglobin 10.0 (*)    HCT 30.2 (*)    All other components within normal limits  COMPREHENSIVE METABOLIC PANEL - Abnormal; Notable for the following components:   Glucose, Bld 107 (*)    BUN 28 (*)    Creatinine, Ser 1.26 (*)    Calcium 8.7 (*)    Total Protein 6.2 (*)    GFR calc non Af Amer 37 (*)    GFR calc Af Amer 43 (*)    All other components within normal limits   ____________________________________________  EKG   ____________________________________________  RADIOLOGY I personally viewed and evaluated these images as part of my medical decision making, as well as reviewing the written report by the radiologist.  DG Forearm Left  Result Date: 10/31/2019 CLINICAL DATA:  Fall, forearm pain EXAM: LEFT FOREARM - 2 VIEW COMPARISON:  None FINDINGS: Diffuse osteopenia. Advanced arthritic changes in the left wrist. No visible acute fracture. No radiopaque foreign bodies. IMPRESSION: No [place] arthritis in the left wrist. No visible acute bony abnormality. Electronically Signed   By: Rolm Baptise M.D.   On: 10/31/2019 17:11   CT Head Wo Contrast  Result Date: 10/31/2019 CLINICAL DATA:  History of trauma. EXAM: CT HEAD WITHOUT CONTRAST CT MAXILLOFACIAL WITHOUT CONTRAST CT CERVICAL SPINE WITHOUT  CONTRAST TECHNIQUE: Multidetector CT imaging of the head, cervical spine, and maxillofacial structures were performed using the standard protocol without intravenous contrast. Multiplanar CT image reconstructions of the cervical spine and maxillofacial structures were also generated. COMPARISON:  Prior CT head from 10/14/2019 FINDINGS: CT HEAD FINDINGS Brain: No evidence of acute infarction, hemorrhage, hydrocephalus, extra-axial collection or mass lesion/mass effect. Signs of remote infarct in the RIGHT occipital and parietal region with resultant encephalomalacia similar to the prior study. Chronic microvascular ischemic changes and atrophy also unchanged. Vascular: No hyperdense vessel or unexpected calcification. Skull: Normal. Negative for fracture or focal lesion. Other: None. CT MAXILLOFACIAL FINDINGS Osseous: No fracture or mandibular dislocation. No destructive process. Orbits: Negative. No traumatic or inflammatory finding. Sinuses: Clear. Soft tissues: Negative. CT CERVICAL SPINE FINDINGS Alignment: Mild dextroconvexity of the cervical spine with similar appearance. Skull base and vertebrae: Extension of the tip of the dens above the base the a on with similar appearance to the prior exam. No compression on upper cord, foramen magnum narrowing similar to the previous exam. No sign of fracture or malalignment. Soft tissues and spinal canal: Extensive atheromatous plaque of the upper portion of the aortic arch which is incidentally imaged. Calcified changes of the carotid arteries bilaterally. Disc levels: Multilevel spinal degenerative changes greatest in the mid cervical spine at C3-4,  C4-5 and C5-6 similar to the previous exam. Upper chest: No acute findings on very limited assessment. Other: None IMPRESSION: 1. No CT evidence for acute intracranial process. 2. Signs of remote PCA and MCA infarcts as before. 3. No evidence for acute facial bone fracture. 4. No evidence for acute fracture or traumatic  malalignment of the cervical spine. 5. Multilevel spinal degenerative changes similar to the previous exam. 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 6. Aortic atherosclerosis. Aortic Atherosclerosis (ICD10-I70.0). Electronically Signed   By: Zetta Bills M.D.   On: 10/31/2019 19:04   CT Cervical Spine Wo Contrast  Result Date: 10/31/2019 CLINICAL DATA:  History of trauma. EXAM: CT HEAD WITHOUT CONTRAST CT MAXILLOFACIAL WITHOUT CONTRAST CT CERVICAL SPINE WITHOUT CONTRAST TECHNIQUE: Multidetector CT imaging of the head, cervical spine, and maxillofacial structures were performed using the standard protocol without intravenous contrast. Multiplanar CT image reconstructions of the cervical spine and maxillofacial structures were also generated. COMPARISON:  Prior CT head from 10/14/2019 FINDINGS: CT HEAD FINDINGS Brain: No evidence of acute infarction, hemorrhage, hydrocephalus, extra-axial collection or mass lesion/mass effect. Signs of remote infarct in the RIGHT occipital and parietal region with resultant encephalomalacia similar to the prior study. Chronic microvascular ischemic changes and atrophy also unchanged. Vascular: No hyperdense vessel or unexpected calcification. Skull: Normal. Negative for fracture or focal lesion. Other: None. CT MAXILLOFACIAL FINDINGS Osseous: No fracture or mandibular dislocation. No destructive process. Orbits: Negative. No traumatic or inflammatory finding. Sinuses: Clear. Soft tissues: Negative. CT CERVICAL SPINE FINDINGS Alignment: Mild dextroconvexity of the cervical spine with similar appearance. Skull base and vertebrae: Extension of the tip of the dens above the base the a on with similar appearance to the prior exam. No compression on upper cord, foramen magnum narrowing similar to the previous exam. No sign of fracture or malalignment. Soft tissues and spinal canal: Extensive atheromatous plaque of the upper portion of the  aortic arch which is incidentally imaged. Calcified changes of the carotid arteries bilaterally. Disc levels: Multilevel spinal degenerative changes greatest in the mid cervical spine at C3-4, C4-5 and C5-6 similar to the previous exam. Upper chest: No acute findings on very limited assessment. Other: None IMPRESSION: 1. No CT evidence for acute intracranial process. 2. Signs of remote PCA and MCA infarcts as before. 3. No evidence for acute facial bone fracture. 4. No evidence for acute fracture or traumatic malalignment of the cervical spine. 5. Multilevel spinal degenerative changes similar to the previous exam. 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 6. Aortic atherosclerosis. Aortic Atherosclerosis (ICD10-I70.0). Electronically Signed   By: Zetta Bills M.D.   On: 10/31/2019 19:04   DG Knee Complete 4 Views Left  Result Date: 10/31/2019 CLINICAL DATA:  Fall, knee pain EXAM: LEFT KNEE - COMPLETE 4+ VIEW COMPARISON:  None. FINDINGS: Small joint effusion. Diffuse osteopenia. Mild tricompartment degenerative changes. No acute bony abnormality. Specifically, no fracture, subluxation, or dislocation. IMPRESSION: No acute bony abnormality. Electronically Signed   By: Rolm Baptise M.D.   On: 10/31/2019 17:11   CT Maxillofacial Wo Contrast  Result Date: 10/31/2019 CLINICAL DATA:  History of trauma. EXAM: CT HEAD WITHOUT CONTRAST CT MAXILLOFACIAL WITHOUT CONTRAST CT CERVICAL SPINE WITHOUT CONTRAST TECHNIQUE: Multidetector CT imaging of the head, cervical spine, and maxillofacial structures were performed using the standard protocol without intravenous contrast. Multiplanar CT image reconstructions of the cervical spine and maxillofacial structures were also generated. COMPARISON:  Prior CT head from 10/14/2019 FINDINGS: CT HEAD FINDINGS  Brain: No evidence of acute infarction, hemorrhage, hydrocephalus, extra-axial collection or mass lesion/mass effect. Signs of  remote infarct in the RIGHT occipital and parietal region with resultant encephalomalacia similar to the prior study. Chronic microvascular ischemic changes and atrophy also unchanged. Vascular: No hyperdense vessel or unexpected calcification. Skull: Normal. Negative for fracture or focal lesion. Other: None. CT MAXILLOFACIAL FINDINGS Osseous: No fracture or mandibular dislocation. No destructive process. Orbits: Negative. No traumatic or inflammatory finding. Sinuses: Clear. Soft tissues: Negative. CT CERVICAL SPINE FINDINGS Alignment: Mild dextroconvexity of the cervical spine with similar appearance. Skull base and vertebrae: Extension of the tip of the dens above the base the a on with similar appearance to the prior exam. No compression on upper cord, foramen magnum narrowing similar to the previous exam. No sign of fracture or malalignment. Soft tissues and spinal canal: Extensive atheromatous plaque of the upper portion of the aortic arch which is incidentally imaged. Calcified changes of the carotid arteries bilaterally. Disc levels: Multilevel spinal degenerative changes greatest in the mid cervical spine at C3-4, C4-5 and C5-6 similar to the previous exam. Upper chest: No acute findings on very limited assessment. Other: None IMPRESSION: 1. No CT evidence for acute intracranial process. 2. Signs of remote PCA and MCA infarcts as before. 3. No evidence for acute facial bone fracture. 4. No evidence for acute fracture or traumatic malalignment of the cervical spine. 5. Multilevel spinal degenerative changes similar to the previous exam. 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 6. Aortic atherosclerosis. Aortic Atherosclerosis (ICD10-I70.0). Electronically Signed   By: Zetta Bills M.D.   On: 10/31/2019 19:04    ____________________________________________    PROCEDURES  Procedure(s) performed:    Procedures    Medications - No data to  display   ____________________________________________   INITIAL IMPRESSION / ASSESSMENT AND PLAN / ED COURSE  Pertinent labs & imaging results that were available during my care of the patient were reviewed by me and considered in my medical decision making (see chart for details).  Review of the  [place] was performed in accordance of the [place] prior to dispensing any controlled drugs.           Patient's diagnosis is consistent with mechanical fall, multiple skin tears.  Patient presented to emergency department after sustaining a mechanical fall.  She was in the restroom, attempted to stand up on her own.  Her foot became tangled in her clothing causing her to fall.  Patient initially had no complaints.  She did have 3 skin tears that were superficial and did not require sutures.  Given these areas, thorough exam was performed.  Patient did have some slight tenderness about the left jaw, left forearm as well as a known fracture to the left knee.  Given this patient had imaging of the head, face, neck, left forearm and left knee.  No additional traumatic injuries identified.  As patient has had multiple falls in the last week, labs and urinalysis was also obtained.  These are reassuring with no evidence of infection or other concerning features such as hypokalemia, hyponatremia.  Wound care instructions discussed with the patient.  Patient is stable for discharge at this time.  No additional work-up..  Patient is given ED precautions to return to the ED for any worsening or new symptoms.     ____________________________________________  FINAL CLINICAL IMPRESSION(S) / ED DIAGNOSES  Final diagnoses:  Fall, initial encounter  Multiple skin tears      NEW MEDICATIONS  STARTED DURING THIS VISIT:  ED Discharge Orders    None          This chart was dictated using voice recognition software/Dragon. Despite best efforts to proofread, errors can occur which can change the meaning.  Any change was purely unintentional.    Darletta Moll, PA-C 10/31/19 1918    Earleen Newport, MD 10/31/19 2019

## 2019-11-15 ENCOUNTER — Other Ambulatory Visit: Payer: Self-pay

## 2019-11-15 ENCOUNTER — Emergency Department
Admission: EM | Admit: 2019-11-15 | Discharge: 2019-11-15 | Disposition: A | Discharge status: Skilled Nursing Facility | Payer: Medicare Other | Attending: Emergency Medicine | Admitting: Emergency Medicine

## 2019-11-15 ENCOUNTER — Emergency Department: Payer: Medicare Other

## 2019-11-15 ENCOUNTER — Encounter: Payer: Self-pay | Admitting: Emergency Medicine

## 2019-11-15 DIAGNOSIS — Y939 Activity, unspecified: Secondary | ICD-10-CM | POA: Insufficient documentation

## 2019-11-15 DIAGNOSIS — S0990XA Unspecified injury of head, initial encounter: Secondary | ICD-10-CM

## 2019-11-15 DIAGNOSIS — Z7951 Long term (current) use of inhaled steroids: Secondary | ICD-10-CM | POA: Insufficient documentation

## 2019-11-15 DIAGNOSIS — W01198A Fall on same level from slipping, tripping and stumbling with subsequent striking against other object, initial encounter: Secondary | ICD-10-CM | POA: Diagnosis not present

## 2019-11-15 DIAGNOSIS — W19XXXA Unspecified fall, initial encounter: Secondary | ICD-10-CM

## 2019-11-15 DIAGNOSIS — Y929 Unspecified place or not applicable: Secondary | ICD-10-CM | POA: Diagnosis not present

## 2019-11-15 DIAGNOSIS — Y999 Unspecified external cause status: Secondary | ICD-10-CM | POA: Diagnosis not present

## 2019-11-15 DIAGNOSIS — I129 Hypertensive chronic kidney disease with stage 1 through stage 4 chronic kidney disease, or unspecified chronic kidney disease: Secondary | ICD-10-CM | POA: Insufficient documentation

## 2019-11-15 DIAGNOSIS — J449 Chronic obstructive pulmonary disease, unspecified: Secondary | ICD-10-CM | POA: Insufficient documentation

## 2019-11-15 DIAGNOSIS — Z79899 Other long term (current) drug therapy: Secondary | ICD-10-CM | POA: Diagnosis not present

## 2019-11-15 DIAGNOSIS — Z87891 Personal history of nicotine dependence: Secondary | ICD-10-CM | POA: Diagnosis not present

## 2019-11-15 DIAGNOSIS — N183 Chronic kidney disease, stage 3 unspecified: Secondary | ICD-10-CM | POA: Insufficient documentation

## 2019-11-15 DIAGNOSIS — Z8673 Personal history of transient ischemic attack (TIA), and cerebral infarction without residual deficits: Secondary | ICD-10-CM | POA: Insufficient documentation

## 2019-11-15 LAB — CBC WITH DIFFERENTIAL/PLATELET
Abs Immature Granulocytes: 0.03 10*3/uL (ref 0.00–0.07)
Basophils Absolute: 0 10*3/uL (ref 0.0–0.1)
Basophils Relative: 1 %
Eosinophils Absolute: 0.1 10*3/uL (ref 0.0–0.5)
Eosinophils Relative: 3 %
HCT: 32.2 % — ABNORMAL LOW (ref 36.0–46.0)
Hemoglobin: 10.2 g/dL — ABNORMAL LOW (ref 12.0–15.0)
Immature Granulocytes: 1 %
Lymphocytes Relative: 22 %
Lymphs Abs: 0.8 10*3/uL (ref 0.7–4.0)
MCH: 29.1 pg (ref 26.0–34.0)
MCHC: 31.7 g/dL (ref 30.0–36.0)
MCV: 92 fL (ref 80.0–100.0)
Monocytes Absolute: 0.2 10*3/uL (ref 0.1–1.0)
Monocytes Relative: 4 %
Neutro Abs: 2.5 10*3/uL (ref 1.7–7.7)
Neutrophils Relative %: 69 %
Platelets: 165 10*3/uL (ref 150–400)
RBC: 3.5 MIL/uL — ABNORMAL LOW (ref 3.87–5.11)
RDW: 14.8 % (ref 11.5–15.5)
WBC: 3.6 10*3/uL — ABNORMAL LOW (ref 4.0–10.5)
nRBC: 0 % (ref 0.0–0.2)

## 2019-11-15 LAB — COMPREHENSIVE METABOLIC PANEL
ALT: 11 U/L (ref 0–44)
AST: 19 U/L (ref 15–41)
Albumin: 4.1 g/dL (ref 3.5–5.0)
Alkaline Phosphatase: 110 U/L (ref 38–126)
Anion gap: 8 (ref 5–15)
BUN: 17 mg/dL (ref 8–23)
CO2: 26 mmol/L (ref 22–32)
Calcium: 8.7 mg/dL — ABNORMAL LOW (ref 8.9–10.3)
Chloride: 106 mmol/L (ref 98–111)
Creatinine, Ser: 1.23 mg/dL — ABNORMAL HIGH (ref 0.44–1.00)
GFR calc Af Amer: 44 mL/min — ABNORMAL LOW (ref >60)
GFR calc non Af Amer: 38 mL/min — ABNORMAL LOW (ref >60)
Glucose, Bld: 121 mg/dL — ABNORMAL HIGH (ref 70–99)
Potassium: 4.8 mmol/L (ref 3.5–5.1)
Sodium: 140 mmol/L (ref 135–145)
Total Bilirubin: 0.7 mg/dL (ref 0.3–1.2)
Total Protein: 6.2 g/dL — ABNORMAL LOW (ref 6.5–8.1)

## 2019-11-15 LAB — URINALYSIS, COMPLETE (UACMP) WITH MICROSCOPIC
Bilirubin Urine: NEGATIVE
Glucose, UA: NEGATIVE mg/dL
Hgb urine dipstick: NEGATIVE
Ketones, ur: NEGATIVE mg/dL
Nitrite: NEGATIVE
Protein, ur: NEGATIVE mg/dL
Specific Gravity, Urine: 1.008 (ref 1.005–1.030)
pH: 6 (ref 5.0–8.0)

## 2019-11-15 NOTE — ED Provider Notes (Signed)
[facility] Emergency Department Provider Note  ____________________________________________  Time seen: Approximately 3:09 PM  I have reviewed the triage vital signs and the nursing notes.   HISTORY  Chief Complaint Fall    HPI Ana Schultz is a 86 y.o. female who presents to the emergency department following a fall and striking her head on the floor.  Resides at a long-term care facility, has had multiple falls over the past month.  Patient was most recently seen by myself 2 weeks ago following a mechanical fall.  Patient states that she was using the restroom, stood up, bent over to pull her pants up and lost her balance.  Patient states that she was able to grab a hold of the rail in the bathroom but was not strong enough to hold herself up and eventually fell sideways.  She did hit her head but did not lose consciousness.  She has knee pain from a fall in the middle of June for which she sustained a fracture to the left knee.  There is no changes in this pain to her left knee.  She denies any pain complaints or other injuries.  She states that she simply lost balance when she bent over to pull up her pants.  Patient again resides in a long-term care facility.  She does acknowledge that she should call for help in these situations as she has had multiple falls but she states that she does not want to wait and feels like she can do it herself.  Patient denies any fevers or chills, headache, visual changes, neck pain or stiffness, chest pain, shortness of breath, abdominal pain, nausea vomiting, diarrhea or constipation.  No dysuria, polyuria, hematuria.         Past Medical History:  Diagnosis Date  . Adenoma of rectum   . Adrenal adenoma   . Anemia   . Anemia, unspecified   . Arthritis   . Ascending aortic aneurysm ([place])    a. s/p repair 07/2014  . Atrophic vaginitis   . Benign neoplasm of colon, unspecified   . COPD (chronic obstructive pulmonary  disease) ([place])   . Coronary artery disease, non-occlusive    a. LHC 06/2014 without evidence of obstructive disease  . Depression   . Diverticulosis   . Dysphagia   . Dysrhythmia   . Essential hypertension    benign  . Frequent UTI   . GERD (gastroesophageal reflux disease)   . Hemiplegia and hemiparesis    following cerebral infarction affecting left non-dominant side  . Hypertension   . Microscopic hematuria   . Nonrheumatic aortic valve insufficiency   . Osteoporosis    unspecified  . Panic attacks   . Paroxysmal atrial fibrillation (HCC)   . Pernicious anemia   . Renal cyst    CKD STAGE 3  . Stroke ([place]) 07/23/2014   a. post-operative setting in 07/2014 leading to left UE hemiapresis and left LE weakness  . Urge incontinence     Patient Active Problem List   Diagnosis Date Noted  . Closed wedge compression fracture of L5 vertebra with routine healing 06/20/2018  . Constipation due to opioid therapy 06/13/2018  . Shingles outbreak 03/23/2018  . Chronic constipation 02/21/2018  . Chronic back pain 02/21/2018  . Chronic cerebrovascular accident (CVA) 02/21/2018  . Hypokalemia 02/21/2018  . Neuropathic pain 02/14/2018  . Chronic cough 07/23/2017  . Closed fracture of shaft of right radius 07/23/2017  . Hemiplegia and hemiparesis following cerebral infarction  affecting left non-dominant side ([place]) 08/28/2016  . Hx of falling 08/28/2016  . Dysphagia following cerebral infarction 08/28/2016  . Dysphagia, oropharyngeal 08/28/2016  . Cognitive communication deficit 08/28/2016  . Hypertensive chronic kidney disease w stg 1-4/unsp chr kdny 08/28/2016  . Chronic kidney disease, stage III (moderate) 08/28/2016  . Vitamin B12 deficiency anemia due to intrinsic factor deficiency 08/28/2016  . Anxiety disorder 08/28/2016  . Insomnia 08/28/2016  . 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 Dry eye syndrome of both lacrimal glands 08/28/2016  .  Allergic rhinitis 08/28/2016  . Bell's palsy 08/28/2016  . Chronic pain 08/28/2016  . Hyperlipidemia 08/28/2016  . GERD (gastroesophageal reflux disease) 08/28/2016  . Closed compression fracture of L2 lumbar vertebra, sequela 04/03/2016  . Paroxysmal atrial fibrillation (HCC)   . Chronic obstructive pulmonary disease (COPD) ([place]) 11/09/2015  . Benign essential HTN 10/09/2014  . OP (osteoporosis) 10/09/2014  . Addison anemia 10/09/2014  . Tobacco abuse, in remission 08/02/2014  . Nonrheumatic aortic valve insufficiency 07/07/2014  . Aneurysm, ascending aorta ([place]) 07/07/2014    Past Surgical History:  Procedure Laterality Date  . ABDOMINAL HYSTERECTOMY  1972  . AORTOILIAC BYPASS    . ASCENDING AORTIC ANEURYSM REPAIR  07/23/2014   07/2014  . BLEPHAROPLASTY    . CATARACT EXTRACTION  2005  . GASTROSTOMY W/ FEEDING TUBE    . INTRAMEDULLARY (IM) NAIL INTERTROCHANTERIC Left 02/26/2016   Procedure: INTRAMEDULLARY (IM) NAIL INTERTROCHANTRIC;  Surgeon: Earnestine Leys, MD;  Location: [facility] ORS;  Service: Orthopedics;  Laterality: Left;  . KYPHOPLASTY N/A 08/26/2016   Procedure: KYPHOPLASTY T12;  Surgeon: Hessie Knows, MD;  Location: [facility] ORS;  Service: Orthopedics;  Laterality: N/A;  . KYPHOPLASTY N/A 06/17/2018   Procedure: L5 KYPHOPLASTY;  Surgeon: Hessie Knows, MD;  Location: [facility] ORS;  Service: Orthopedics;  Laterality: N/A;  . LEFT HEART CATH  06/2014  . PR ASCEND AORTIC GRAFT INCL VALVE SUSPENSION  07/07/2014   Procedure: ASCENDING AORTA GRAFT, WITH CARDIOPULMONARY BYPASS, WITH OR WITHOUT VALVE SUSPENSION; Surgeon: Dwaine Deter, MD; Location: MAIN OR [facility]; Service: Cardiothoracic  . PR LAP, GASTROTOMY W/O TUBE CONSTR  08/08/2014   Procedure: LAPAROSCOPY, SURGICAL; GASTOSTOMY W/O CONSTRUCTION OF GASTRIC TUBE (EG, STAMM PROCEDURE)(SEPARATE PROCED); Surgeon: Fredrik Rigger, MD; Location: MAIN OR [facility]; Service: Gastrointestinal  . PR PLACE PERCUT GASTROSTOMY TUBE  08/08/2014    Procedure: UGI ENDO; W/DIRECTED PLCMT PERQ GASTROSTOMY TUBE; Surgeon: Fredrik Rigger, MD; Location: MAIN OR [facility]; Service: Gastrointestinal    Prior to Admission medications   Medication Sig Start Date End Date Taking? Authorizing Provider  acetaminophen (TYLENOL) 325 MG tablet Take 2 tablets (650 mg total) by mouth 2 (two) times daily. 08/02/18   Medina-Vargas, Monina C, NP  albuterol (PROVENTIL HFA;VENTOLIN HFA) 108 (90 Base) MCG/ACT inhaler Inhale 2 puffs into the lungs every 4 (four) hours as needed for wheezing or shortness of breath. 11/09/15   Flora Lipps, MD  alum & mag hydroxide-simeth (MAALOX PLUS) 400-400-40 MG/5ML suspension Take 30 mLs by mouth every 4 (four) hours as needed.    [provider]  aspirin EC 81 MG tablet Take 81 mg by mouth daily.    [provider]  BREO ELLIPTA 100-25 MCG/INH AEPB Inhale 1 puff into the lungs daily. Rinse mouth well after use 08/18/16   [provider]  carboxymethylcellulose (REFRESH TEARS) 0.5 % SOLN Place 2 drops into both eyes. 1 - 4 times daily as needed    [provider]  diclofenac  sodium (VOLTAREN) 1 % GEL Apply 4 g topically 3 (three) times daily as needed. Apply to bilateral hips and knees every 8 hours as needed for pain 03/18/18   [provider]  diltiazem (CARDIZEM) 60 MG tablet Take 60 mg by mouth daily.    [provider]  docusate sodium (COLACE) 100 MG capsule Take 1 capsule (100 mg total) by mouth 2 (two) times daily. 03/01/16   Fritzi Mandes, MD  gabapentin (NEURONTIN) 300 MG capsule Take 300 mg by mouth every 12 (twelve) hours.  06/07/18   [provider]  ipratropium-albuterol (DUONEB) 0.5-2.5 (3) MG/3ML SOLN Take 3 mLs by nebulization 3 (three) times daily.     [provider]  Lidocaine 4 % PTCH Apply 1 patch daily to right Flank / sore ribs.  Remove after 12 hours    [provider]  loperamide (IMODIUM) 2 MG capsule Take 2 mg by mouth as needed  for diarrhea or loose stools.    [provider]  magnesium hydroxide (MILK OF MAGNESIA) 400 MG/5ML suspension Take 30 mLs by mouth daily as needed for mild constipation.    [provider]  methocarbamol (ROBAXIN) 500 MG tablet Take 1 tablet (500 mg total) by mouth every 8 (eight) hours as needed for muscle spasms. 07/19/18   Medina-Vargas, Monina C, NP  montelukast (SINGULAIR) 10 MG tablet Take 10 mg by mouth at bedtime.     [provider]  oxyCODONE (ROXICODONE) 5 MG immediate release tablet Take 1 tablet (5 mg total) by mouth every 12 (twelve) hours as needed for moderate pain or severe pain. 08/02/18   Medina-Vargas, Monina C, NP  OXYGEN Inhale 2 L into the lungs continuous as needed.    [provider]  pantoprazole (PROTONIX) 40 MG tablet Take 40 mg by mouth daily.    [provider]  Polyethyl Glycol-Propyl Glycol (SYSTANE) 0.4-0.3 % GEL ophthalmic gel Place 1 application into both eyes at bedtime. (2 drops ) for dry eyes (Ointments are on Backorder)     [provider]  polyethylene glycol (MIRALAX / GLYCOLAX) packet Take 17 g by mouth 2 (two) times daily as needed.    [provider]  potassium chloride (K-DUR,KLOR-CON) 10 MEQ tablet Take 10 mEq by mouth daily.  03/05/18   [provider]  predniSONE (DELTASONE) 5 MG tablet Take 5 mg by mouth daily with breakfast.  04/15/18   [provider]  senna-docusate (SENNA-PLUS) 8.6-50 MG tablet Take 2 tablets by mouth 2 (two) times daily.     [provider]  sertraline (ZOLOFT) 100 MG tablet Take 100 mg by mouth daily.     [provider]  sertraline (ZOLOFT) 50 MG tablet Take 50 mg by mouth daily.     [provider]  Skin Protectants, Misc. (MINERIN) CREA Apply liberal amount daily to arms and legs after the bath and PRN for skin integrity    [provider]  sodium phosphate Pediatric (FLEET) 3.5-9.5 GM/59ML enema Place 1 enema  rectally once as needed for severe constipation. Take once every 3 days if constipation is not relieved by Milk of magnesia or Bisacodyl Suppository    [provider]  torsemide (DEMADEX) 10 MG tablet Take 10 mg by mouth daily.  02/16/18   [provider]  UNABLE TO FIND Diet Type: NAS    [provider]    Allergies Iodinated diagnostic agents, Nitrofurantoin, Omnipaque [iohexol], Solifenacin, Ciprofloxacin, Metronidazole, and Sulfa antibiotics  Family History  Problem Relation Age of Onset  . Lung cancer Son   . CAD Father   . Heart attack Father   . CAD Brother   . Heart attack Brother   . Stroke Mother     Social History Social History   Tobacco Use  . Smoking status: Former Smoker    Packs/day: 0.50    Years: 50.00    Pack years: 25.00    Types: Cigarettes    Quit date: 05/30/2014    Years since quitting: 5.4  . Smokeless tobacco: Never Used  Vaping Use  . Vaping Use: Never used  Substance Use Topics  . Alcohol use: No    Comment: occasionally  . Drug use: No     Review of Systems  Constitutional: No fever/chills.  Fall today, struck her head with no loss of consciousness. Eyes: No visual changes. No discharge ENT: No upper respiratory complaints. Cardiovascular: no chest pain. Respiratory: no cough. No SOB. Gastrointestinal: No abdominal pain.  No nausea, no vomiting.  No diarrhea.  No constipation. Genitourinary: Negative for dysuria. No hematuria Musculoskeletal: Negative for musculoskeletal pain. Skin: Negative for rash, abrasions, lacerations, ecchymosis. Neurological: Negative for headaches, focal weakness or numbness. 10-point ROS otherwise negative.  ____________________________________________   PHYSICAL EXAM:  VITAL SIGNS: ED Triage Vitals  Enc Vitals Group     BP 11/15/19 1508 (!) 161/52     Pulse Rate 11/15/19 1508 65     Resp 11/15/19 1508 18     Temp 11/15/19 1508 98.3 F (36.8 C)     Temp Source 11/15/19  1508 Oral     SpO2 11/15/19 1508 96 %     Weight 11/15/19 1505 130 lb 1.1 oz (59 kg)     Height 11/15/19 1505 5\' 3"  (1.6 m)     Head Circumference --      Peak Flow --      Pain Score 11/15/19 1505 0     Pain Loc --      Pain Edu? --      Excl. in [place]? --      Constitutional: Alert and oriented. Well appearing and in no acute distress. Eyes: Conjunctivae are normal. PERRL. EOMI. Head: Atraumatic.  No visible signs of trauma with edema, ecchymosis, hematoma, abrasions or lacerations.  Nontender to palpation of the osseous structures of the skull, face.  No palpable abnormality.  No crepitus.  No battle signs, raccoon eyes, serosanguineous fluid drainage from the ears or nares. ENT:      Ears:       Nose: No congestion/rhinnorhea.      Mouth/Throat: Mucous membranes are moist.  Neck: No stridor.  No cervical spine tenderness to palpation.  Cardiovascular: Normal rate, regular rhythm. Normal S1 and S2.  Good peripheral circulation. Respiratory: Normal respiratory effort without tachypnea or retractions. Lungs CTAB. Good air entry to the bases with no decreased or absent breath sounds. Gastrointestinal: Bowel sounds 4 quadrants. Soft and nontender to palpation. No guarding or rigidity. No palpable masses. No distention. No CVA tenderness. Musculoskeletal: Full range of motion to all extremities. No gross deformities appreciated.  Patient has good range of motion to all 4 extremities.  Patient does have some tenderness about the left knee from a known fracture from a month ago.  Other than this finding, no acute findings to all 4 extremities.  No deformities.  No palpable abnormalities.  No tenderness.  Radial pulse intact bilateral upper extremities.  Dorsalis pedis pulse intact bilateral lower extremities.  Sensation intact and equal in both the upper and lower extremities. Neurologic:  Normal speech and language. No gross focal neurologic deficits are appreciated.  Skin:  Skin is warm, dry  and intact. No rash noted.  Multiple areas of ecchymosis, healing skin tears from previous falls.  No evidence of new soft tissue injury. Psychiatric: Mood and affect are normal. Speech and behavior are normal. Patient exhibits appropriate insight and judgement.   ____________________________________________   LABS (all labs ordered are listed, but only abnormal results are displayed)  Labs Reviewed  COMPREHENSIVE METABOLIC PANEL - Abnormal; Notable for the following components:      Result Value   Glucose, Bld 121 (*)    Creatinine, Ser 1.23 (*)    Calcium 8.7 (*)    Total Protein 6.2 (*)    GFR calc non Af Amer 38 (*)    GFR calc Af Amer 44 (*)    All other components within normal limits  CBC WITH DIFFERENTIAL/PLATELET - Abnormal; Notable for the following components:   WBC 3.6 (*)    RBC 3.50 (*)    Hemoglobin 10.2 (*)    HCT 32.2 (*)    All other components within normal limits  URINALYSIS, COMPLETE (UACMP) WITH MICROSCOPIC - Abnormal; Notable for the following components:   Color, Urine STRAW (*)    APPearance CLEAR (*)    Leukocytes,Ua TRACE (*)    Bacteria, UA RARE (*)    All other components within normal limits   ____________________________________________  EKG   ____________________________________________  RADIOLOGY I personally viewed and evaluated these images as part of my medical decision making, as well as reviewing the written report by the radiologist.  CT Head Wo Contrast  Result Date: 11/15/2019 CLINICAL DATA:  Multiple falls EXAM: CT HEAD WITHOUT CONTRAST TECHNIQUE: Contiguous axial images were obtained from the base of the skull through the vertex without intravenous contrast. COMPARISON:  10/31/2019 FINDINGS: Brain: There is no acute intracranial hemorrhage, mass effect, or edema. No new loss of gray-white differentiation. Chronic right parietal and occipital infarcts. Additional chronic infarct of the right basal ganglia and adjacent white matter.  There is no extra-axial fluid collection. Prominence of the ventricles and sulci reflects stable parenchymal volume loss. Additional patchy hypoattenuation in the supratentorial white matter is nonspecific parotid flecks stable chronic microvascular ischemic changes. Vascular: There is atherosclerotic calcification at the skull base. Skull: Calvarium is unremarkable. Sinuses/Orbits: No acute finding. Other: None. IMPRESSION: No evidence of acute intracranial injury. Stable chronic findings detailed above. Electronically Signed   By: Macy Mis M.D.   On: 11/15/2019 16:31   CT Cervical Spine Wo Contrast  Result Date: 11/15/2019 CLINICAL DATA:  Fall EXAM: CT CERVICAL SPINE WITHOUT CONTRAST TECHNIQUE: Multidetector CT imaging of the cervical spine was performed without intravenous contrast. Multiplanar CT image reconstructions were also generated. COMPARISON:  10/31/2019 FINDINGS: Alignment: Stable Skull base and vertebrae: Stable vertebral body heights. Osseous demineralization. No acute cervical spine fracture identified. Unchanged invagination of the tip of the dens above the foramen magnum. Soft tissues and spinal canal: No prevertebral fluid or swelling. No visible canal hematoma. Disc levels: Multilevel degenerative changes are stable over the short interval. Upper chest: Minimally included. Other: Atherosclerotic calcification. IMPRESSION: No acute cervical spine fracture. Electronically Signed   By: Macy Mis M.D.   On: 11/15/2019 16:35    ____________________________________________    PROCEDURES  Procedure(s) performed:    Procedures    Medications - No data to display   ____________________________________________  INITIAL IMPRESSION / ASSESSMENT AND PLAN / ED COURSE  Pertinent labs & imaging results that were available during my care of the patient were reviewed by me and considered in my medical decision making (see chart for details).  Review of the [place] CSRS was  performed in accordance of the [place] prior to dispensing any controlled drugs.  Clinical Course as of Nov 15 1707  Tue Nov 15, 2019  1520 Patient presents the emergency department for multiple falls in the past month.  This is patient's third encounter in the last month for mostly mechanical fall.  Patient did sustain a knee fracture 1 month ago.  Patient was seen by myself 2 weeks ago after sustaining a mechanical fall.  At that time patient had sustained skin tears but no other acute injury.  Patient states that she lost her balance when trying to pull her pants up today.  She did hit her head but did not lose consciousness.  Overall exam is reassuring however given multiple falls patient will be evaluated with imaging and basic labs.  CBC, CMP, urinalysis were to evaluate for any significant sources of infection, urinary tract infection, electrolyte abnormalities.  Patient will have CT head and neck to evaluate for any injury following striking her head on the floor.   [JC]    Clinical Course User Index [JC] Peachie Barkalow, Charline Bills, PA-C          Patient's diagnosis is consistent with fall, minor head injury.  Patient presented to the emergency department after mechanical fall.  Patient states that she had bent over trying to pull her pants up after using the restroom when she lost her balance.  Patient grabbed a hold of the handrail but did not have the strength to keep herself from falling over.  She denied any other complaints.  She did hit her head during the fall.  Given this and the fact the patient has had multiple falls recently she had imaging of her head and neck which were reassuring with no evidence of acute intracranial hemorrhage, CVA, or traumatic findings.  Patient had basic labs, urinalysis which are also reassuring at this time.  Patient has no complaints throughout the duration of her stay and at this time I feel patient is stable to return back to the long-term care facility.  I  have discussed with the patient her multiple falls and the need to seek assistance when ambulating, using the restroom.  She verbalizes understanding of same..  Follow-up primary care as needed.  Patient is given ED precautions to return to the ED for any worsening or new symptoms.     ____________________________________________  FINAL CLINICAL IMPRESSION(S) / ED DIAGNOSES  Final diagnoses:  Fall, initial encounter  Minor head injury, initial encounter      NEW MEDICATIONS STARTED DURING THIS VISIT:  ED Discharge Orders    None          This chart was dictated using voice recognition software/Dragon. Despite best efforts to proofread, errors can occur which can change the meaning. Any change was purely unintentional.    Darletta Moll, PA-C 11/15/19 1709    Nance Pear, MD 11/15/19 1730

## 2019-11-15 NOTE — ED Triage Notes (Signed)
Presents via EMS from [employer] she was trying to pull up her pants  Tripped   Fell hitting back of head  No loc  Denies any pain

## 2019-11-28 ENCOUNTER — Telehealth: Payer: Self-pay

## 2019-11-28 NOTE — Telephone Encounter (Signed)
Called lmom informing patient of appointment on 11/30/2019. klh 

## 2019-11-30 ENCOUNTER — Ambulatory Visit: Payer: Medicare Other | Admitting: Adult Health

## 2019-12-02 ENCOUNTER — Telehealth: Payer: Self-pay

## 2019-12-02 NOTE — Telephone Encounter (Signed)
Confirmed and screened for 12-06-19 ov. 

## 2019-12-06 ENCOUNTER — Encounter: Payer: Self-pay | Admitting: Internal Medicine

## 2019-12-06 ENCOUNTER — Ambulatory Visit: Payer: Medicare Other | Admitting: Hospice and Palliative Medicine

## 2019-12-06 ENCOUNTER — Other Ambulatory Visit: Payer: Self-pay

## 2019-12-06 VITALS — BP 150/52 | HR 65 | Temp 97.4°F | Resp 16 | Ht 63.0 in

## 2019-12-06 DIAGNOSIS — N1831 Chronic kidney disease, stage 3a: Secondary | ICD-10-CM | POA: Diagnosis not present

## 2019-12-06 DIAGNOSIS — F325 Major depressive disorder, single episode, in full remission: Secondary | ICD-10-CM

## 2019-12-06 DIAGNOSIS — J438 Other emphysema: Secondary | ICD-10-CM

## 2019-12-06 DIAGNOSIS — I482 Chronic atrial fibrillation, unspecified: Secondary | ICD-10-CM | POA: Diagnosis not present

## 2019-12-06 DIAGNOSIS — G8929 Other chronic pain: Secondary | ICD-10-CM

## 2019-12-06 DIAGNOSIS — M545 Low back pain: Secondary | ICD-10-CM | POA: Diagnosis not present

## 2019-12-06 DIAGNOSIS — I69354 Hemiplegia and hemiparesis following cerebral infarction affecting left non-dominant side: Secondary | ICD-10-CM

## 2019-12-06 NOTE — Progress Notes (Signed)
New Patient Office Visit  Subjective:  Patient ID: Ana Schultz, female    DOB: Feb 18, 1926  Age: 86 y.o. MRN: [id number]  CC:  Chief Complaint  Patient presents with  . New Patient (Initial Visit)  . Quality Metric Gaps    Recieved COVID vaccines on 1/15, 2/12 - can not remember if it was Pfizer or Moderna    HPI ASIA DUSEN[place] presents today to establish care for PCP. Currently resides at [facility] and is brought in today and chaperoned by a transported from facility. She requires care at skilled nursing facility due to having a stroke that left her with left sided partial paralysis and weakness. She was seen in the ED in June of this year for a fall. Reports that she has had several falls in the past due to impaired mobility. Now uses wheelchair full-time and requires full assistance with transfers to and from bed. Medication assistance provided by facility Does not report any acute issues today Would like to stop taking some of her medications as she feels she is taking too many  Past Medical History:  Diagnosis Date  . Adenoma of rectum   . Adrenal adenoma   . Anemia   . Anemia, unspecified   . Arthritis   . Ascending aortic aneurysm ([place])    a. s/p repair 07/2014  . Atrophic vaginitis   . Benign neoplasm of colon, unspecified   . COPD (chronic obstructive pulmonary disease) ([place])   . Coronary artery disease, non-occlusive    a. LHC 06/2014 without evidence of obstructive disease  . Depression   . Diverticulosis   . Dysphagia   . Dysrhythmia   . Essential hypertension    benign  . Frequent UTI   . GERD (gastroesophageal reflux disease)   . Hemiplegia and hemiparesis    following cerebral infarction affecting left non-dominant side  . Hypertension   . Microscopic hematuria   . Nonrheumatic aortic valve insufficiency   . Osteoporosis    unspecified  . Panic attacks   . Paroxysmal atrial fibrillation (HCC)   . Pernicious anemia   . Renal cyst     CKD STAGE 3  . Stroke ([place]) 07/23/2014   a. post-operative setting in 07/2014 leading to left UE hemiapresis and left LE weakness  . Urge incontinence     Past Surgical History:  Procedure Laterality Date  . ABDOMINAL HYSTERECTOMY  1972  . AORTOILIAC BYPASS    . ASCENDING AORTIC ANEURYSM REPAIR  07/23/2014   07/2014  . BLEPHAROPLASTY    . CATARACT EXTRACTION  2005  . GASTROSTOMY W/ FEEDING TUBE    . INTRAMEDULLARY (IM) NAIL INTERTROCHANTERIC Left 02/26/2016   Procedure: INTRAMEDULLARY (IM) NAIL INTERTROCHANTRIC;  Surgeon: Earnestine Leys, MD;  Location: [facility] ORS;  Service: Orthopedics;  Laterality: Left;  . KYPHOPLASTY N/A 08/26/2016   Procedure: KYPHOPLASTY T12;  Surgeon: Hessie Knows, MD;  Location: [facility] ORS;  Service: Orthopedics;  Laterality: N/A;  . KYPHOPLASTY N/A 06/17/2018   Procedure: L5 KYPHOPLASTY;  Surgeon: Hessie Knows, MD;  Location: [facility] ORS;  Service: Orthopedics;  Laterality: N/A;  . LEFT HEART CATH  06/2014  . PR ASCEND AORTIC GRAFT INCL VALVE SUSPENSION  07/07/2014   Procedure: ASCENDING AORTA GRAFT, WITH CARDIOPULMONARY BYPASS, WITH OR WITHOUT VALVE SUSPENSION; Surgeon: Dwaine Deter, MD; Location: [facility]; Service: Cardiothoracic  . PR LAP, GASTROTOMY W/O TUBE CONSTR  08/08/2014   Procedure: LAPAROSCOPY, SURGICAL; GASTOSTOMY W/O CONSTRUCTION OF GASTRIC TUBE (EG, STAMM PROCEDURE)(SEPARATE PROCED); Surgeon: Christia Reading  Louretta Parma, MD; Location: MAIN OR [facility]; Service: Gastrointestinal  . PR PLACE PERCUT GASTROSTOMY TUBE  08/08/2014   Procedure: UGI ENDO; W/DIRECTED PLCMT PERQ GASTROSTOMY TUBE; Surgeon: Fredrik Rigger, MD; Location: MAIN OR [facility]; Service: Gastrointestinal    Family History  Problem Relation Age of Onset  . Lung cancer Son   . CAD Father   . Heart attack Father   . CAD Brother   . Heart attack Brother     Social History   Socioeconomic History  . Marital status: Widowed    Spouse name: Not on file  . Number of children: 3  .  Years of education: 51  . Highest education level: Not on file  Occupational History  . Not on file  Tobacco Use  . Smoking status: Former Smoker    Packs/day: 0.25    Years: 50.00    Pack years: 12.50    Types: Cigarettes  . Smokeless tobacco: Never Used  . Tobacco comment: 4-5 cigarettes per day   Vaping Use  . Vaping Use: Never used  Substance and Sexual Activity  . Alcohol use: No    Comment: occasionally  . Drug use: No  . Sexual activity: Not on file  Other Topics Concern  . Not on file  Social History Narrative   Admitted to [facility] 03/01/2016   Widowed   4 children   Former Smoker   Occasionally drinks   DNR   Social Determinants of Radio broadcast assistant Strain:   . Difficulty of Paying Living Expenses:   Food Insecurity:   . Worried About Charity fundraiser in the Last Year:   . Arboriculturist in the Last Year:   Transportation Needs:   . Film/video editor (Medical):   Marland Kitchen Lack of Transportation (Non-Medical):   Physical Activity:   . Days of Exercise per Week:   . Minutes of Exercise per Session:   Stress:   . Feeling of Stress :   Social Connections:   . Frequency of Communication with Friends and Family:   . Frequency of Social Gatherings with Friends and Family:   . Attends Religious Services:   . Active Member of Clubs or Organizations:   . Attends Archivist Meetings:   Marland Kitchen Marital Status:   Intimate Partner Violence:   . Fear of Current or Ex-Partner:   . Emotionally Abused:   Marland Kitchen Physically Abused:   . Sexually Abused:     ROS Review of Systems  Constitutional: Negative.        For chills, fever, fatigue.  HENT: Negative.        For sinus pain, sinus pressure, sore throat, trouble swallowing.  Eyes: Negative.        For changes in vision or visual disturbances.  Respiratory: Negative.        For chest tightness, cough, shortness of breath, wheezing.  Cardiovascular: Negative.        For chest pain,  ankle swelling, palpitations.  Gastrointestinal: Negative.        For abdominal pain, constipation, nausea, vomiting, diarrhea.  Endocrine: Negative.        For polydipsia, polyphagia, polyuria.  Genitourinary: Negative.        For dysuria, flank pain, hematuria, increased frequency, urgency.  Musculoskeletal: Positive for arthralgias, back pain, gait problem and myalgias.  Skin:       For rash, wound.  Allergic/Immunologic: Negative.   Neurological: Positive for weakness.  For dizziness, headaches, tremors, weakness.  Hematological: Negative.   Psychiatric/Behavioral: Negative.        Confusion, depression, anxiety, sleep disturbances.    Objective:   Today's Vitals: BP (!) 150/52   Pulse 65   Temp (!) 97.4 F (36.3 C)   Resp 16   Ht 5\' 3"  (1.6 m)   SpO2 95%   BMI 23.03 kg/m   Physical Exam Constitutional:      Appearance: Normal appearance.  HENT:     Head: Normocephalic.     Nose: Nose normal.     Mouth/Throat:     Mouth: Mucous membranes are moist.     Pharynx: Oropharynx is clear.  Cardiovascular:     Rate and Rhythm: Normal rate and regular rhythm.     Pulses: Normal pulses.     Heart sounds: Normal heart sounds.  Pulmonary:     Effort: Pulmonary effort is normal.     Breath sounds: Normal breath sounds.  Abdominal:     General: Abdomen is flat. Bowel sounds are normal.     Palpations: Abdomen is soft.  Musculoskeletal:     Cervical back: Normal range of motion.     Comments: Left sided weakness and paralysis  Neurological:     Mental Status: She is alert.     Assessment & Plan:   1. Chronic bilateral low back pain without sciatica Stable at this time, not complaining of acute pain. Continue with PRN pain medications for pain flares. Encourage to continue to work with PT for strength training.  2. Stage 3a chronic kidney disease Last CMP from July this year reviewed. GFR stable at this time. Continue to avoid nephrotoxic medications and dose  accordingly.  3. Chronic atrial fibrillation (HCC) Rate stable at this time, continue with current therapy.  4. Other emphysema ([place]) Breathing stable at this time. Not requiring oxygen therapy at this time. Continue with Breo and nebulizer treatments.  5. 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  6. Hemiplegia and hemiparesis following cerebral infarction affecting left non-dominant side ([place]) Requiring wheelchair for ambulation. Requires moderate assistance with transitions to and from chair to bed. Encouraged to continue with physical therapy to prevent further deconditioning.  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 acetaminophen (TYLENOL) 325 MG tablet Take 2 tablets (650 mg total) by mouth 2 (two) times daily.  Marland Kitchen albuterol (PROVENTIL HFA;VENTOLIN HFA) 108 (90 Base) MCG/ACT inhaler Inhale 2 puffs into the lungs every 4 (four) hours as needed for wheezing or shortness of breath.  Marland Kitchen alum & mag hydroxide-simeth (MAALOX PLUS) 400-400-40 MG/5ML suspension Take 30 mLs by mouth every 4 (four) hours as needed.  Marland Kitchen aspirin EC 81 MG tablet Take 81 mg by mouth daily.  Marland Kitchen BREO ELLIPTA 100-25 MCG/INH AEPB Inhale 1 puff into the lungs daily. Rinse mouth well after use  . carboxymethylcellulose (REFRESH  TEARS) 0.5 % SOLN Place 2 drops into both eyes. 1 - 4 times daily as needed  . diclofenac sodium  (VOLTAREN) 1 % GEL Apply 4 g topically 3 (three) times daily as needed. Apply to bilateral hips and knees every 8 hours as needed for pain  . diltiazem (CARDIZEM) 60 MG tablet Take 60 mg by mouth daily.  Marland Kitchen docusate sodium (COLACE) 100 MG capsule Take 1 capsule (100 mg total) by mouth 2 (two) times daily.  Marland Kitchen gabapentin (NEURONTIN) 300 MG capsule Take 300 mg by mouth every 12 (twelve) hours.   Marland Kitchen ipratropium-albuterol (DUONEB) 0.5-2.5 (3) MG/3ML SOLN Take 3 mLs by nebulization 3 (three) times daily.   . Lidocaine 4 % PTCH Apply 1 patch daily to right Flank / sore ribs.  Remove after 12 hours  . loperamide (IMODIUM) 2 MG capsule Take 2 mg by mouth as needed for diarrhea or loose stools.  . magnesium hydroxide (MILK OF MAGNESIA) 400 MG/5ML suspension Take 30 mLs by mouth daily as needed for mild constipation.  . methocarbamol (ROBAXIN) 500 MG tablet Take 1 tablet (500 mg total) by mouth every 8 (eight) hours as needed for muscle spasms.  . montelukast (SINGULAIR) 10 MG tablet Take 10 mg by mouth at bedtime.   Marland Kitchen oxyCODONE (ROXICODONE) 5 MG immediate release tablet Take 1 tablet (5 mg total) by mouth every 12 (twelve) hours as needed for moderate pain or severe pain.  . OXYGEN Inhale 2 L into the lungs continuous as needed.  . pantoprazole (PROTONIX) 40 MG tablet Take 40 mg by mouth daily.  Vladimir Faster Glycol-Propyl Glycol (SYSTANE) 0.4-0.3 % GEL ophthalmic gel Place 1 application into both eyes at bedtime. (2 drops ) for dry eyes (Ointments are on Backorder)   . polyethylene glycol (MIRALAX / GLYCOLAX) packet Take 17 g by mouth 2 (two) times daily as needed.  . potassium chloride (K-DUR,KLOR-CON) 10 MEQ tablet Take 10 mEq by mouth daily.   . predniSONE (DELTASONE) 5 MG tablet Take 5 mg by mouth daily with breakfast.   . senna-docusate (SENNA-PLUS) 8.6-50 MG tablet Take 2 tablets by mouth 2 (two) times daily.   . sertraline (ZOLOFT) 100 MG tablet Take 100 mg by mouth daily.   . sertraline (ZOLOFT) 50 MG  tablet Take 50 mg by mouth daily.   . Skin Protectants, Misc. (MINERIN) CREA Apply liberal amount daily to arms and legs after the bath and PRN for skin integrity  . sodium phosphate Pediatric (FLEET) 3.5-9.5 GM/59ML enema Place 1 enema rectally once as needed for severe constipation. Take once every 3 days if constipation is not relieved by Milk of magnesia or Bisacodyl Suppository  . torsemide (DEMADEX) 10 MG tablet Take 10 mg by mouth daily.   Marland Kitchen UNABLE TO FIND Diet Type: NAS   No facility-administered encounter medications on file as of 12/06/2019.    Follow-up: Return in about 3 months (around 03/07/2020) for F/U, TH PCP.   Clayborn Bigness, MD

## 2019-12-12 DIAGNOSIS — I482 Chronic atrial fibrillation, unspecified: Secondary | ICD-10-CM | POA: Insufficient documentation

## 2020-01-13 ENCOUNTER — Other Ambulatory Visit: Payer: Self-pay | Admitting: Adult Health

## 2020-01-13 ENCOUNTER — Other Ambulatory Visit: Payer: Self-pay | Admitting: Hospice and Palliative Medicine

## 2020-01-13 MED ORDER — BENZONATATE 100 MG PO CAPS: 100.0000 mg | ORAL_CAPSULE | Freq: Three times a day (TID) | ORAL | 0 refills | Status: AC | PRN

## 2020-01-13 MED ORDER — AZITHROMYCIN 250 MG PO TABS: ORAL_TABLET | ORAL | 0 refills | Status: AC

## 2020-01-13 NOTE — Progress Notes (Signed)
azithr

## 2020-01-18 ENCOUNTER — Inpatient Hospital Stay
Admission: EM | Admit: 2020-01-18 | Discharge: 2020-02-03 | DRG: 871 | Disposition: E | Payer: Medicare Other | Source: Skilled Nursing Facility | Attending: Family Medicine | Admitting: Family Medicine

## 2020-01-18 ENCOUNTER — Emergency Department: Payer: Medicare Other

## 2020-01-18 ENCOUNTER — Other Ambulatory Visit: Payer: Self-pay

## 2020-01-18 DIAGNOSIS — Z66 Do not resuscitate: Secondary | ICD-10-CM | POA: Diagnosis not present

## 2020-01-18 DIAGNOSIS — I2510 Atherosclerotic heart disease of native coronary artery without angina pectoris: Secondary | ICD-10-CM | POA: Diagnosis present

## 2020-01-18 DIAGNOSIS — K5790 Diverticulosis of intestine, part unspecified, without perforation or abscess without bleeding: Secondary | ICD-10-CM | POA: Diagnosis present

## 2020-01-18 DIAGNOSIS — D72810 Lymphocytopenia: Secondary | ICD-10-CM

## 2020-01-18 DIAGNOSIS — Z87440 Personal history of urinary (tract) infections: Secondary | ICD-10-CM | POA: Diagnosis not present

## 2020-01-18 DIAGNOSIS — R296 Repeated falls: Secondary | ICD-10-CM | POA: Diagnosis present

## 2020-01-18 DIAGNOSIS — I351 Nonrheumatic aortic (valve) insufficiency: Secondary | ICD-10-CM | POA: Diagnosis present

## 2020-01-18 DIAGNOSIS — I10 Essential (primary) hypertension: Secondary | ICD-10-CM | POA: Diagnosis present

## 2020-01-18 DIAGNOSIS — J1282 Pneumonia due to coronavirus disease 2019: Secondary | ICD-10-CM | POA: Diagnosis present

## 2020-01-18 DIAGNOSIS — Z90710 Acquired absence of both cervix and uterus: Secondary | ICD-10-CM

## 2020-01-18 DIAGNOSIS — I129 Hypertensive chronic kidney disease with stage 1 through stage 4 chronic kidney disease, or unspecified chronic kidney disease: Secondary | ICD-10-CM | POA: Diagnosis present

## 2020-01-18 DIAGNOSIS — Z515 Encounter for palliative care: Secondary | ICD-10-CM

## 2020-01-18 DIAGNOSIS — N1832 Chronic kidney disease, stage 3b: Secondary | ICD-10-CM | POA: Diagnosis present

## 2020-01-18 DIAGNOSIS — F411 Generalized anxiety disorder: Secondary | ICD-10-CM | POA: Diagnosis present

## 2020-01-18 DIAGNOSIS — R0602 Shortness of breath: Secondary | ICD-10-CM | POA: Diagnosis present

## 2020-01-18 DIAGNOSIS — M1990 Unspecified osteoarthritis, unspecified site: Secondary | ICD-10-CM | POA: Diagnosis present

## 2020-01-18 DIAGNOSIS — N952 Postmenopausal atrophic vaginitis: Secondary | ICD-10-CM | POA: Diagnosis present

## 2020-01-18 DIAGNOSIS — Z87891 Personal history of nicotine dependence: Secondary | ICD-10-CM

## 2020-01-18 DIAGNOSIS — R6520 Severe sepsis without septic shock: Secondary | ICD-10-CM | POA: Diagnosis present

## 2020-01-18 DIAGNOSIS — I69354 Hemiplegia and hemiparesis following cerebral infarction affecting left non-dominant side: Secondary | ICD-10-CM | POA: Diagnosis not present

## 2020-01-18 DIAGNOSIS — J9621 Acute and chronic respiratory failure with hypoxia: Secondary | ICD-10-CM | POA: Diagnosis present

## 2020-01-18 DIAGNOSIS — I82409 Acute embolism and thrombosis of unspecified deep veins of unspecified lower extremity: Secondary | ICD-10-CM

## 2020-01-18 DIAGNOSIS — F410 Panic disorder [episodic paroxysmal anxiety] without agoraphobia: Secondary | ICD-10-CM | POA: Diagnosis present

## 2020-01-18 DIAGNOSIS — R5081 Fever presenting with conditions classified elsewhere: Secondary | ICD-10-CM

## 2020-01-18 DIAGNOSIS — Z9981 Dependence on supplemental oxygen: Secondary | ICD-10-CM

## 2020-01-18 DIAGNOSIS — F329 Major depressive disorder, single episode, unspecified: Secondary | ICD-10-CM

## 2020-01-18 DIAGNOSIS — I712 Thoracic aortic aneurysm, without rupture: Secondary | ICD-10-CM | POA: Diagnosis present

## 2020-01-18 DIAGNOSIS — Z801 Family history of malignant neoplasm of trachea, bronchus and lung: Secondary | ICD-10-CM

## 2020-01-18 DIAGNOSIS — Z881 Allergy status to other antibiotic agents status: Secondary | ICD-10-CM

## 2020-01-18 DIAGNOSIS — U071 COVID-19: Secondary | ICD-10-CM | POA: Diagnosis present

## 2020-01-18 DIAGNOSIS — J440 Chronic obstructive pulmonary disease with acute lower respiratory infection: Secondary | ICD-10-CM | POA: Diagnosis present

## 2020-01-18 DIAGNOSIS — F325 Major depressive disorder, single episode, in full remission: Secondary | ICD-10-CM | POA: Diagnosis present

## 2020-01-18 DIAGNOSIS — A4189 Other specified sepsis: Principal | ICD-10-CM | POA: Diagnosis present

## 2020-01-18 DIAGNOSIS — J9600 Acute respiratory failure, unspecified whether with hypoxia or hypercapnia: Secondary | ICD-10-CM | POA: Diagnosis present

## 2020-01-18 DIAGNOSIS — K219 Gastro-esophageal reflux disease without esophagitis: Secondary | ICD-10-CM | POA: Diagnosis present

## 2020-01-18 DIAGNOSIS — Z8249 Family history of ischemic heart disease and other diseases of the circulatory system: Secondary | ICD-10-CM

## 2020-01-18 DIAGNOSIS — D3500 Benign neoplasm of unspecified adrenal gland: Secondary | ICD-10-CM | POA: Diagnosis present

## 2020-01-18 DIAGNOSIS — Z79899 Other long term (current) drug therapy: Secondary | ICD-10-CM

## 2020-01-18 DIAGNOSIS — F0390 Unspecified dementia without behavioral disturbance: Secondary | ICD-10-CM | POA: Diagnosis present

## 2020-01-18 DIAGNOSIS — Z888 Allergy status to other drugs, medicaments and biological substances status: Secondary | ICD-10-CM

## 2020-01-18 DIAGNOSIS — I482 Chronic atrial fibrillation, unspecified: Secondary | ICD-10-CM | POA: Diagnosis present

## 2020-01-18 DIAGNOSIS — F32A Depression, unspecified: Secondary | ICD-10-CM | POA: Diagnosis present

## 2020-01-18 DIAGNOSIS — I480 Paroxysmal atrial fibrillation: Secondary | ICD-10-CM | POA: Diagnosis present

## 2020-01-18 DIAGNOSIS — R0902 Hypoxemia: Secondary | ICD-10-CM

## 2020-01-18 DIAGNOSIS — Z7982 Long term (current) use of aspirin: Secondary | ICD-10-CM

## 2020-01-18 DIAGNOSIS — Z7189 Other specified counseling: Secondary | ICD-10-CM | POA: Diagnosis not present

## 2020-01-18 DIAGNOSIS — Z7952 Long term (current) use of systemic steroids: Secondary | ICD-10-CM

## 2020-01-18 DIAGNOSIS — Z882 Allergy status to sulfonamides status: Secondary | ICD-10-CM

## 2020-01-18 DIAGNOSIS — Z86010 Personal history of colonic polyps: Secondary | ICD-10-CM | POA: Diagnosis not present

## 2020-01-18 DIAGNOSIS — Z993 Dependence on wheelchair: Secondary | ICD-10-CM

## 2020-01-18 DIAGNOSIS — M810 Age-related osteoporosis without current pathological fracture: Secondary | ICD-10-CM | POA: Diagnosis present

## 2020-01-18 DIAGNOSIS — J449 Chronic obstructive pulmonary disease, unspecified: Secondary | ICD-10-CM | POA: Diagnosis present

## 2020-01-18 LAB — CBC WITH DIFFERENTIAL/PLATELET
Abs Immature Granulocytes: 0.06 10*3/uL (ref 0.00–0.07)
Basophils Absolute: 0 10*3/uL (ref 0.0–0.1)
Basophils Relative: 0 %
Eosinophils Absolute: 0 10*3/uL (ref 0.0–0.5)
Eosinophils Relative: 0 %
HCT: 29.2 % — ABNORMAL LOW (ref 36.0–46.0)
Hemoglobin: 9.6 g/dL — ABNORMAL LOW (ref 12.0–15.0)
Immature Granulocytes: 2 %
Lymphocytes Relative: 19 %
Lymphs Abs: 0.5 10*3/uL — ABNORMAL LOW (ref 0.7–4.0)
MCH: 28.4 pg (ref 26.0–34.0)
MCHC: 32.9 g/dL (ref 30.0–36.0)
MCV: 86.4 fL (ref 80.0–100.0)
Monocytes Absolute: 0.1 10*3/uL (ref 0.1–1.0)
Monocytes Relative: 5 %
Neutro Abs: 2.2 10*3/uL (ref 1.7–7.7)
Neutrophils Relative %: 74 %
Platelets: 186 10*3/uL (ref 150–400)
RBC: 3.38 MIL/uL — ABNORMAL LOW (ref 3.87–5.11)
RDW: 14.6 % (ref 11.5–15.5)
WBC: 2.9 10*3/uL — ABNORMAL LOW (ref 4.0–10.5)
nRBC: 0 % (ref 0.0–0.2)

## 2020-01-18 LAB — COMPREHENSIVE METABOLIC PANEL
ALT: 13 U/L (ref 0–44)
AST: 29 U/L (ref 15–41)
Albumin: 3.6 g/dL (ref 3.5–5.0)
Alkaline Phosphatase: 65 U/L (ref 38–126)
Anion gap: 12 (ref 5–15)
BUN: 37 mg/dL — ABNORMAL HIGH (ref 8–23)
CO2: 24 mmol/L (ref 22–32)
Calcium: 8.1 mg/dL — ABNORMAL LOW (ref 8.9–10.3)
Chloride: 102 mmol/L (ref 98–111)
Creatinine, Ser: 1.41 mg/dL — ABNORMAL HIGH (ref 0.44–1.00)
GFR calc Af Amer: 37 mL/min — ABNORMAL LOW (ref >60)
GFR calc non Af Amer: 32 mL/min — ABNORMAL LOW (ref >60)
Glucose, Bld: 113 mg/dL — ABNORMAL HIGH (ref 70–99)
Potassium: 4.2 mmol/L (ref 3.5–5.1)
Sodium: 138 mmol/L (ref 135–145)
Total Bilirubin: 0.8 mg/dL (ref 0.3–1.2)
Total Protein: 6.4 g/dL — ABNORMAL LOW (ref 6.5–8.1)

## 2020-01-18 LAB — SARS CORONAVIRUS 2 BY RT PCR (HOSPITAL ORDER, PERFORMED IN ~~LOC~~ HOSPITAL LAB): SARS Coronavirus 2: POSITIVE — AB

## 2020-01-18 LAB — LACTIC ACID, PLASMA: Lactic Acid, Venous: 1.2 mmol/L (ref 0.5–1.9)

## 2020-01-18 LAB — BRAIN NATRIURETIC PEPTIDE: B Natriuretic Peptide: 531 pg/mL — ABNORMAL HIGH (ref 0.0–100.0)

## 2020-01-18 LAB — TROPONIN I (HIGH SENSITIVITY): Troponin I (High Sensitivity): 54 ng/L — ABNORMAL HIGH (ref <18)

## 2020-01-18 MED ORDER — METHOCARBAMOL 500 MG PO TABS
500.0000 mg | ORAL_TABLET | Freq: Three times a day (TID) | ORAL | Status: DC | PRN
  Filled 2020-01-18 (×2): qty 1

## 2020-01-18 MED ORDER — ALUM & MAG HYDROXIDE-SIMETH 200-200-20 MG/5ML PO SUSP
30.0000 mL | ORAL | Status: DC | PRN
  Filled 2020-01-18: qty 30

## 2020-01-18 MED ORDER — TORSEMIDE 20 MG PO TABS
10.0000 mg | ORAL_TABLET | Freq: Every day | ORAL | Status: DC
  Administered 2020-01-19 – 2020-01-20 (×2): 10 mg via ORAL
  Filled 2020-01-18 (×2): qty 1

## 2020-01-18 MED ORDER — POLYVINYL ALCOHOL 1.4 % OP SOLN
2.0000 [drp] | Freq: Three times a day (TID) | OPHTHALMIC | Status: DC | PRN
  Filled 2020-01-18: qty 15

## 2020-01-18 MED ORDER — SODIUM CHLORIDE 0.9 % IV SOLN
100.0000 mg | Freq: Every day | INTRAVENOUS | Status: DC
  Administered 2020-01-19 – 2020-01-21 (×3): 100 mg via INTRAVENOUS
  Filled 2020-01-18 (×5): qty 20

## 2020-01-18 MED ORDER — SODIUM CHLORIDE 0.9 % IV SOLN: 100.0000 mg | Freq: Every day | INTRAVENOUS | Status: DC

## 2020-01-18 MED ORDER — GABAPENTIN 300 MG PO CAPS
300.0000 mg | ORAL_CAPSULE | Freq: Two times a day (BID) | ORAL | Status: DC
  Administered 2020-01-18 – 2020-01-21 (×7): 300 mg via ORAL
  Filled 2020-01-18 (×2): qty 1
  Filled 2020-01-18: qty 3
  Filled 2020-01-18 (×4): qty 1

## 2020-01-18 MED ORDER — DICLOFENAC SODIUM 1 % TD GEL
4.0000 g | Freq: Three times a day (TID) | TRANSDERMAL | Status: DC | PRN
  Filled 2020-01-18: qty 100

## 2020-01-18 MED ORDER — PANTOPRAZOLE SODIUM 40 MG PO TBEC
40.0000 mg | DELAYED_RELEASE_TABLET | Freq: Every day | ORAL | Status: DC
  Administered 2020-01-19 – 2020-01-21 (×3): 40 mg via ORAL
  Filled 2020-01-18 (×3): qty 1

## 2020-01-18 MED ORDER — OXYCODONE HCL 5 MG PO TABS
5.0000 mg | ORAL_TABLET | Freq: Two times a day (BID) | ORAL | Status: DC | PRN
  Administered 2020-01-22: 5 mg via ORAL
  Filled 2020-01-18: qty 1

## 2020-01-18 MED ORDER — PREDNISONE 20 MG PO TABS: 50.0000 mg | ORAL_TABLET | Freq: Every day | ORAL | Status: DC

## 2020-01-18 MED ORDER — SODIUM CHLORIDE 0.9 % IV SOLN
250.0000 mL | INTRAVENOUS | Status: DC | PRN
  Administered 2020-01-19: 14:00:00 250 mL via INTRAVENOUS

## 2020-01-18 MED ORDER — POLYETHYL GLYCOL-PROPYL GLYCOL 0.4-0.3 % OP GEL: 1.0000 "application " | Freq: Every day | OPHTHALMIC | Status: DC

## 2020-01-18 MED ORDER — SODIUM CHLORIDE 0.9% FLUSH: 3.0000 mL | INTRAVENOUS | Status: DC | PRN

## 2020-01-18 MED ORDER — SODIUM CHLORIDE 0.9 % IV SOLN: 200.0000 mg | Freq: Once | INTRAVENOUS | Status: DC

## 2020-01-18 MED ORDER — ONDANSETRON HCL 4 MG/2ML IJ SOLN: 4.0000 mg | Freq: Four times a day (QID) | INTRAMUSCULAR | Status: DC | PRN

## 2020-01-18 MED ORDER — BENZONATATE 100 MG PO CAPS: 100.0000 mg | ORAL_CAPSULE | Freq: Three times a day (TID) | ORAL | Status: DC | PRN

## 2020-01-18 MED ORDER — ENOXAPARIN SODIUM 40 MG/0.4ML ~~LOC~~ SOLN: 40.0000 mg | SUBCUTANEOUS | Status: DC

## 2020-01-18 MED ORDER — DEXAMETHASONE SODIUM PHOSPHATE 10 MG/ML IJ SOLN
6.0000 mg | Freq: Once | INTRAMUSCULAR | Status: AC
  Administered 2020-01-18: 6 mg via INTRAVENOUS
  Filled 2020-01-18: qty 1

## 2020-01-18 MED ORDER — POTASSIUM CHLORIDE CRYS ER 10 MEQ PO TBCR
10.0000 meq | EXTENDED_RELEASE_TABLET | Freq: Every day | ORAL | Status: DC
  Administered 2020-01-19 – 2020-01-20 (×2): 10 meq via ORAL
  Filled 2020-01-18 (×2): qty 1

## 2020-01-18 MED ORDER — ASCORBIC ACID 500 MG PO TABS
500.0000 mg | ORAL_TABLET | Freq: Every day | ORAL | Status: DC
  Administered 2020-01-19 – 2020-01-21 (×3): 500 mg via ORAL
  Filled 2020-01-18 (×3): qty 1

## 2020-01-18 MED ORDER — LIDOCAINE 5 % EX PTCH
MEDICATED_PATCH | Freq: Every day | CUTANEOUS | Status: DC
  Administered 2020-01-20 – 2020-01-21 (×2): 1 via TRANSDERMAL
  Filled 2020-01-18 (×4): qty 1

## 2020-01-18 MED ORDER — ASPIRIN EC 81 MG PO TBEC
81.0000 mg | DELAYED_RELEASE_TABLET | Freq: Every day | ORAL | Status: DC
  Administered 2020-01-19 – 2020-01-21 (×3): 81 mg via ORAL
  Filled 2020-01-18 (×3): qty 1

## 2020-01-18 MED ORDER — ACETAMINOPHEN 325 MG PO TABS
650.0000 mg | ORAL_TABLET | Freq: Four times a day (QID) | ORAL | Status: DC | PRN
  Administered 2020-01-22: 650 mg via ORAL
  Filled 2020-01-18: qty 2

## 2020-01-18 MED ORDER — MONTELUKAST SODIUM 10 MG PO TABS
10.0000 mg | ORAL_TABLET | Freq: Every day | ORAL | Status: DC
  Administered 2020-01-18 – 2020-01-21 (×4): 10 mg via ORAL
  Filled 2020-01-18 (×5): qty 1

## 2020-01-18 MED ORDER — SERTRALINE HCL 50 MG PO TABS: 100.0000 mg | ORAL_TABLET | Freq: Every day | ORAL | Status: DC

## 2020-01-18 MED ORDER — ACETAMINOPHEN 325 MG PO TABS
650.0000 mg | ORAL_TABLET | Freq: Two times a day (BID) | ORAL | Status: DC
  Administered 2020-01-18 – 2020-01-21 (×7): 650 mg via ORAL
  Filled 2020-01-18 (×7): qty 2

## 2020-01-18 MED ORDER — ONDANSETRON HCL 4 MG PO TABS: 4.0000 mg | ORAL_TABLET | Freq: Four times a day (QID) | ORAL | Status: DC | PRN

## 2020-01-18 MED ORDER — ACETAMINOPHEN 500 MG PO TABS
1000.0000 mg | ORAL_TABLET | Freq: Once | ORAL | Status: AC
  Administered 2020-01-18: 1000 mg via ORAL
  Filled 2020-01-18: qty 2

## 2020-01-18 MED ORDER — POLYETHYLENE GLYCOL 3350 17 G PO PACK: 17.0000 g | PACK | Freq: Two times a day (BID) | ORAL | Status: DC | PRN

## 2020-01-18 MED ORDER — ENOXAPARIN SODIUM 30 MG/0.3ML ~~LOC~~ SOLN
30.0000 mg | SUBCUTANEOUS | Status: DC
  Administered 2020-01-18 – 2020-01-21 (×4): 30 mg via SUBCUTANEOUS
  Filled 2020-01-18 (×5): qty 0.3

## 2020-01-18 MED ORDER — FLUTICASONE FUROATE-VILANTEROL 100-25 MCG/INH IN AEPB
1.0000 | INHALATION_SPRAY | Freq: Every day | RESPIRATORY_TRACT | Status: DC
  Administered 2020-01-19 – 2020-01-21 (×3): 1 via RESPIRATORY_TRACT
  Filled 2020-01-18: qty 28

## 2020-01-18 MED ORDER — GUAIFENESIN-DM 100-10 MG/5ML PO SYRP
10.0000 mL | ORAL_SOLUTION | ORAL | Status: DC | PRN
  Administered 2020-01-19: 13:00:00 10 mL via ORAL
  Filled 2020-01-18 (×3): qty 10

## 2020-01-18 MED ORDER — SODIUM CHLORIDE 0.9 % IV SOLN
200.0000 mg | Freq: Once | INTRAVENOUS | Status: AC
  Administered 2020-01-18: 200 mg via INTRAVENOUS
  Filled 2020-01-18: qty 200

## 2020-01-18 MED ORDER — ALBUTEROL SULFATE HFA 108 (90 BASE) MCG/ACT IN AERS: 2.0000 | INHALATION_SPRAY | Freq: Four times a day (QID) | RESPIRATORY_TRACT | Status: DC

## 2020-01-18 MED ORDER — METHYLPREDNISOLONE SODIUM SUCC 40 MG IJ SOLR
30.0000 mg | Freq: Two times a day (BID) | INTRAMUSCULAR | Status: AC
  Administered 2020-01-18 – 2020-01-21 (×6): 30 mg via INTRAVENOUS
  Filled 2020-01-18 (×6): qty 1

## 2020-01-18 MED ORDER — SODIUM CHLORIDE 0.9% FLUSH
3.0000 mL | Freq: Two times a day (BID) | INTRAVENOUS | Status: DC
  Administered 2020-01-18 – 2020-01-21 (×7): 3 mL via INTRAVENOUS

## 2020-01-18 MED ORDER — ZINC SULFATE 220 (50 ZN) MG PO CAPS
220.0000 mg | ORAL_CAPSULE | Freq: Every day | ORAL | Status: DC
  Administered 2020-01-19 – 2020-01-21 (×3): 220 mg via ORAL
  Filled 2020-01-18 (×3): qty 1

## 2020-01-18 MED ORDER — DOCUSATE SODIUM 100 MG PO CAPS
100.0000 mg | ORAL_CAPSULE | Freq: Two times a day (BID) | ORAL | Status: DC
  Administered 2020-01-18 – 2020-01-19 (×3): 100 mg via ORAL
  Filled 2020-01-18 (×3): qty 1

## 2020-01-18 MED ORDER — ALBUTEROL SULFATE HFA 108 (90 BASE) MCG/ACT IN AERS
2.0000 | INHALATION_SPRAY | RESPIRATORY_TRACT | Status: DC | PRN
  Filled 2020-01-18: qty 6.7

## 2020-01-18 MED ORDER — DILTIAZEM HCL 30 MG PO TABS
60.0000 mg | ORAL_TABLET | Freq: Every day | ORAL | Status: DC
  Administered 2020-01-19 – 2020-01-21 (×3): 60 mg via ORAL
  Filled 2020-01-18 (×3): qty 2

## 2020-01-18 NOTE — Progress Notes (Signed)
Transported patient to ward with tech from ed. Met cna's at entrance and instructed to have RN to place patient back on 15 Liters on HF nasal cannula

## 2020-01-18 NOTE — ED Notes (Signed)
Respiratory called to switched patient to high flow nasal cannula

## 2020-01-18 NOTE — ED Notes (Signed)
Pt given phone to speak with family member Leroy Sea.

## 2020-01-18 NOTE — Consult Note (Signed)
Remdesivir - Pharmacy Brief Note   O:  ALT: CMP pending CXR: No imaging available SpO2: Hypoxic requiring supplemental oxygen   A/P:  Per chart review, patient from [facility] with hypoxia and known COVID +. Inpatient testing pending.   Remdesivir 200 mg IVPB once followed by 100 mg IVPB daily x 4 days.   Benita Gutter 01/30/2020 9:58 AM

## 2020-01-18 NOTE — ED Notes (Signed)
Pt son Leroy Sea updated on patient condition and status. Pt son Leroy Sea indicates that he is POA. Pt son voicing concerns that he cannot come see patient at this time. This RN explained Covid restrictions but informed Leroy Sea that we would get a phone to mrs. Paar as soon as she wakes up. This RN informed son that patient is currently resting comfortable and when she awakens this RN will bring patient phone to speak to son if she wishes. Pt is currently still resting comfortably at this time. Brad's number is [phone].

## 2020-01-18 NOTE — ED Provider Notes (Signed)
[facility] Emergency Department Provider Note ____________________________________________   First MD Initiated Contact with Patient 01/17/2020 [id number]     (approximate)  I have reviewed the triage vital signs and the nursing notes.  HISTORY  Chief Complaint Shortness of Breath   HPI Ana Schultz is a 86 y.o. femalewho presents to the ED for evaluation of shortness of breath and hypoxia.   Chart review indicates history of CVA with residual left-sided deficits and recurrent falls.  Wheelchair dependent. Patient is a resident at local SNF, [facility].   Reportedly tested positive for Covid in the past week, and has been managed as an outpatient.  Fully vaccinated with mRNA vaccine back in January/February.  Patient is unable to provide any significant history.  When asked what happened this morning she reports "I do not know."  She reports she was in her bed this morning, felt short of breath and then does not know what happened.  She currently reports feeling better and denies pain.  EMS reports the patient was found at her facility confused and short of breath with sats in the 40s on room air within her room.  Patient was placed on NRB with resolution of hypoxia.  History is limited due to her amnesia and dementia.   Past Medical History:  Diagnosis Date  . Adenoma of rectum   . Adrenal adenoma   . Anemia   . Anemia, unspecified   . Arthritis   . Ascending aortic aneurysm ([place])    a. s/p repair 07/2014  . Atrophic vaginitis   . Benign neoplasm of colon, unspecified   . COPD (chronic obstructive pulmonary disease) ([place])   . Coronary artery disease, non-occlusive    a. LHC 06/2014 without evidence of obstructive disease  . Depression   . Diverticulosis   . Dysphagia   . Dysrhythmia   . Essential hypertension    benign  . Frequent UTI   . GERD (gastroesophageal reflux disease)   . Hemiplegia and hemiparesis    following cerebral  infarction affecting left non-dominant side  . Hypertension   . Microscopic hematuria   . Nonrheumatic aortic valve insufficiency   . Osteoporosis    unspecified  . Panic attacks   . Paroxysmal atrial fibrillation (HCC)   . Pernicious anemia   . Renal cyst    CKD STAGE 3  . Stroke ([place]) 07/23/2014   a. post-operative setting in 07/2014 leading to left UE hemiapresis and left LE weakness  . Urge incontinence     Patient Active Problem List   Diagnosis Date Noted  . Chronic atrial fibrillation ([place]) 12/12/2019  . Closed wedge compression fracture of L5 vertebra with routine healing 06/20/2018  . Constipation due to opioid therapy 06/13/2018  . Shingles outbreak 03/23/2018  . Chronic constipation 02/21/2018  . Chronic back pain 02/21/2018  . Chronic cerebrovascular accident (CVA) 02/21/2018  . Hypokalemia 02/21/2018  . Neuropathic pain 02/14/2018  . Chronic cough 07/23/2017  . Closed fracture of shaft of right radius 07/23/2017  . Hemiplegia and hemiparesis following cerebral infarction affecting left non-dominant side ([place]) 08/28/2016  . Hx of falling 08/28/2016  . Dysphagia following cerebral infarction 08/28/2016  . Dysphagia, oropharyngeal 08/28/2016  . Cognitive communication deficit 08/28/2016  . Hypertensive chronic kidney disease w stg 1-4/unsp chr kdny 08/28/2016  . Chronic kidney disease, stage III (moderate) 08/28/2016  . Vitamin B12 deficiency anemia due to intrinsic factor deficiency 08/28/2016  . Anxiety disorder 08/28/2016  . Insomnia 08/28/2016  .  Major depressive disorder, single episode in full remission ([place]) 08/28/2016  . Dry eye syndrome of both lacrimal glands 08/28/2016  . Allergic rhinitis 08/28/2016  . Bell's palsy 08/28/2016  . Chronic pain 08/28/2016  . Hyperlipidemia 08/28/2016  . GERD (gastroesophageal reflux disease) 08/28/2016  . Closed compression fracture of L2 lumbar vertebra, sequela 04/03/2016  . Paroxysmal atrial fibrillation (HCC)   .  Chronic obstructive pulmonary disease (COPD) ([place]) 11/09/2015  . Benign essential HTN 10/09/2014  . OP (osteoporosis) 10/09/2014  . Addison anemia 10/09/2014  . Tobacco abuse, in remission 08/02/2014  . Nonrheumatic aortic valve insufficiency 07/07/2014  . Aneurysm, ascending aorta ([place]) 07/07/2014    Past Surgical History:  Procedure Laterality Date  . ABDOMINAL HYSTERECTOMY  1972  . AORTOILIAC BYPASS    . ASCENDING AORTIC ANEURYSM REPAIR  07/23/2014   07/2014  . BLEPHAROPLASTY    . CATARACT EXTRACTION  2005  . GASTROSTOMY W/ FEEDING TUBE    . INTRAMEDULLARY (IM) NAIL INTERTROCHANTERIC Left 02/26/2016   Procedure: INTRAMEDULLARY (IM) NAIL INTERTROCHANTRIC;  Surgeon: Earnestine Leys, MD;  Location: [facility] ORS;  Service: Orthopedics;  Laterality: Left;  . KYPHOPLASTY N/A 08/26/2016   Procedure: KYPHOPLASTY T12;  Surgeon: Hessie Knows, MD;  Location: [facility] ORS;  Service: Orthopedics;  Laterality: N/A;  . KYPHOPLASTY N/A 06/17/2018   Procedure: L5 KYPHOPLASTY;  Surgeon: Hessie Knows, MD;  Location: [facility] ORS;  Service: Orthopedics;  Laterality: N/A;  . LEFT HEART CATH  06/2014  . PR ASCEND AORTIC GRAFT INCL VALVE SUSPENSION  07/07/2014   Procedure: ASCENDING AORTA GRAFT, WITH CARDIOPULMONARY BYPASS, WITH OR WITHOUT VALVE SUSPENSION; Surgeon: Dwaine Deter, MD; Location: MA[facility] OR [facility]; Service: Cardiothoracic  . PR LAP, GASTROTOMY W/O TUBE CONSTR  08/08/2014   Procedure: LAPAROSCOPY, SURGICAL; GASTOSTOMY W/O CONSTRUCTION OF GASTRIC TUBE (EG, STAMM PROCEDURE)(SEPARATE PROCED); Surgeon: Fredrik Rigger, MD; Location: MA[facility] OR [facility]; Service: Gastrointestinal  . PR PLACE PERCUT GASTROSTOMY TUBE  08/08/2014   Procedure: UGI ENDO; W/DIRECTED PLCMT PERQ GASTROSTOMY TUBE; Surgeon: Fredrik Rigger, MD; Location: MAIN OR [facility]; Service: Gastrointestinal    Prior to Admission medications   Medication Sig Start Date End Date Taking? Authorizing Provider  acetaminophen (TYLENOL) 325 MG  tablet Take 2 tablets (650 mg total) by mouth 2 (two) times daily. 08/02/18   Medina-Vargas, Monina C, NP  albuterol (PROVENTIL HFA;VENTOLIN HFA) 108 (90 Base) MCG/ACT inhaler Inhale 2 puffs into the lungs every 4 (four) hours as needed for wheezing or shortness of breath. 11/09/15   Flora Lipps, MD  alum & mag hydroxide-simeth (MAALOX PLUS) 400-400-40 MG/5ML suspension Take 30 mLs by mouth every 4 (four) hours as needed.    [provider]  aspirin EC 81 MG tablet Take 81 mg by mouth daily.    [provider]  azithromycin (ZITHROMAX Z-PAK) 250 MG tablet Take 2 tablets (500 mg) on  Day 1,  followed by 1 tablet (250 mg) once daily on Days 2 through 5. 01/13/20 01/21/2020  Scarboro, Audie Clear, NP  benzonatate (TESSALON PERLES) 100 MG capsule Take 1 capsule (100 mg total) by mouth 3 (three) times daily as needed for cough. 01/13/20 01/12/21  Scarboro, Audie Clear, NP  BREO ELLIPTA 100-25 MCG/INH AEPB Inhale 1 puff into the lungs daily. Rinse mouth well after use 08/18/16   [provider]  carboxymethylcellulose (REFRESH TEARS) 0.5 % SOLN Place 2 drops into both eyes. 1 - 4 times daily as needed    [provider]  diclofenac sodium (VOLTAREN) 1 % GEL Apply 4  g topically 3 (three) times daily as needed. Apply to bilateral hips and knees every 8 hours as needed for pain 03/18/18   [provider]  diltiazem (CARDIZEM) 60 MG tablet Take 60 mg by mouth daily.    [provider]  docusate sodium (COLACE) 100 MG capsule Take 1 capsule (100 mg total) by mouth 2 (two) times daily. 03/01/16   Fritzi Mandes, MD  gabapentin (NEURONTIN) 300 MG capsule Take 300 mg by mouth every 12 (twelve) hours.  06/07/18   [provider]  ipratropium-albuterol (DUONEB) 0.5-2.5 (3) MG/3ML SOLN Take 3 mLs by nebulization 3 (three) times daily.     [provider]  Lidocaine 4 % PTCH Apply 1 patch daily to right Flank / sore ribs.  Remove after 12 hours    [provider]    loperamide (IMODIUM) 2 MG capsule Take 2 mg by mouth as needed for diarrhea or loose stools.    [provider]  magnesium hydroxide (MILK OF MAGNESIA) 400 MG/5ML suspension Take 30 mLs by mouth daily as needed for mild constipation.    [provider]  methocarbamol (ROBAXIN) 500 MG tablet Take 1 tablet (500 mg total) by mouth every 8 (eight) hours as needed for muscle spasms. 07/19/18   Medina-Vargas, Monina C, NP  montelukast (SINGULAIR) 10 MG tablet Take 10 mg by mouth at bedtime.     [provider]  oxyCODONE (ROXICODONE) 5 MG immediate release tablet Take 1 tablet (5 mg total) by mouth every 12 (twelve) hours as needed for moderate pain or severe pain. 08/02/18   Medina-Vargas, Monina C, NP  OXYGEN Inhale 2 L into the lungs continuous as needed.    [provider]  pantoprazole (PROTONIX) 40 MG tablet Take 40 mg by mouth daily.    [provider]  Polyethyl Glycol-Propyl Glycol (SYSTANE) 0.4-0.3 % GEL ophthalmic gel Place 1 application into both eyes at bedtime. (2 drops ) for dry eyes (Ointments are on Backorder)     [provider]  polyethylene glycol (MIRALAX / GLYCOLAX) packet Take 17 g by mouth 2 (two) times daily as needed.    [provider]  potassium chloride (K-DUR,KLOR-CON) 10 MEQ tablet Take 10 mEq by mouth daily.  03/05/18   [provider]  predniSONE (DELTASONE) 5 MG tablet Take 5 mg by mouth daily with breakfast.  04/15/18   [provider]  senna-docusate (SENNA-PLUS) 8.6-50 MG tablet Take 2 tablets by mouth 2 (two) times daily.     [provider]  sertraline (ZOLOFT) 100 MG tablet Take 100 mg by mouth daily.     [provider]  sertraline (ZOLOFT) 50 MG tablet Take 50 mg by mouth daily.     [provider]  Skin Protectants, Misc. (MINERIN) CREA Apply liberal amount daily to arms and legs after the bath and PRN for skin integrity    [provider]  sodium  phosphate Pediatric (FLEET) 3.5-9.5 GM/59ML enema Place 1 enema rectally once as needed for severe constipation. Take once every 3 days if constipation is not relieved by Milk of magnesia or Bisacodyl Suppository    [provider]  torsemide (DEMADEX) 10 MG tablet Take 10 mg by mouth daily.  02/16/18   [provider]  UNABLE TO FIND Diet Type: NAS    [provider]    Allergies Iodinated diagnostic agents, Nitrofurantoin, Omnipaque [iohexol], Solifenacin, Ciprofloxacin, Metronidazole, and Sulfa antibiotics  Family History  Problem Relation Age of Onset  .  Lung cancer Son   . CAD Father   . Heart attack Father   . CAD Brother   . Heart attack Brother     Social History Social History   Tobacco Use  . Smoking status: Former Smoker    Packs/day: 0.25    Years: 50.00    Pack years: 12.50    Types: Cigarettes  . Smokeless tobacco: Never Used  . Tobacco comment: 4-5 cigarettes per day   Vaping Use  . Vaping Use: Never used  Substance Use Topics  . Alcohol use: No    Comment: occasionally  . Drug use: No    Review of Systems  Unable to be accurately assessed due to patient's dementia and amnesia.  ____________________________________________   PHYSICAL EXAM:  VITAL SIGNS: Vitals:   01/24/2020 0934 01/13/2020 1000  BP: (!) 162/65 (!) 160/60  Pulse: 88 86  Resp: (!) 32 (!) 25  Temp: (!) 101.1 F (38.4 C)   SpO2: 100% 100%     Constitutional: Alert and oriented to person, place and time.  Sitting up in bed on NRB, conversational in short phrases only due to her dyspnea and tachypnea. Eyes: Conjunctivae are normal. PERRL. EOMI. Head: Atraumatic. Nose: No congestion/rhinnorhea. Mouth/Throat: Mucous membranes are dry.  Oropharynx non-erythematous. Neck: No stridor. No cervical spine tenderness to palpation. Cardiovascular: Normal rate, regular rhythm. Grossly normal heart sounds.  Good peripheral circulation. Respiratory: No retractions.   She is tachypneic to the low 30s and only speaking in short phrases due to this.  Clear lungs throughout. Gastrointestinal: Soft , nondistended, nontender to palpation. No abdominal bruits. No CVA tenderness. Musculoskeletal: No lower extremity tenderness nor edema.  No joint effusions. No signs of acute trauma. Neurologic: Left-sided residual deficits at baseline without evidence of acute neurovascular deficits. Skin:  Skin is warm, dry and intact. No rash noted. Psychiatric: Mood and affect are normal. Speech and behavior are normal.  ____________________________________________   LABS (all labs ordered are listed, but only abnormal results are displayed)  Labs Reviewed  BRAIN NATRIURETIC PEPTIDE - Abnormal; Notable for the following components:      Result Value   B Natriuretic Peptide 531.0 (*)    All other components within normal limits  CBC WITH DIFFERENTIAL/PLATELET - Abnormal; Notable for the following components:   WBC 2.9 (*)    RBC 3.38 (*)    Hemoglobin 9.6 (*)    HCT 29.2 (*)    Lymphs Abs 0.5 (*)    All other components within normal limits  COMPREHENSIVE METABOLIC PANEL - Abnormal; Notable for the following components:   Glucose, Bld 113 (*)    BUN 37 (*)    Creatinine, Ser 1.41 (*)    Calcium 8.1 (*)    Total Protein 6.4 (*)    GFR calc non Af Amer 32 (*)    GFR calc Af Amer 37 (*)    All other components within normal limits  TROPONIN I (HIGH SENSITIVITY) - Abnormal; Notable for the following components:   Troponin I (High Sensitivity) 54 (*)    All other components within normal limits  SARS CORONAVIRUS 2 BY RT PCR (HOSPITAL ORDER, [place])  LACTIC ACID, PLASMA  TROPONIN I (HIGH SENSITIVITY)   ____________________________________________  12 Lead EKG  Sinus rhythm, rate of 88 bpm.  Normal axis.  RBBB, and otherwise normal intervals.  No evidence of acute  ischemia. ____________________________________________  RADIOLOGY  ED MD interpretation: 2 view CXR with extensive multifocal infiltrates  Official radiology report(s): DG Chest Portable 1 View  Result Date: 01/15/2020 CLINICAL DATA:  COVID positive, hypoxic EXAM: PORTABLE CHEST 1 VIEW COMPARISON:  01/13/2019 FINDINGS: Extensive bilateral airspace disease, new since prior study. Appearance is compatible with COVID pneumonia. Heart is borderline in size. Diffuse aortic atherosclerosis. No effusions or pneumothorax. IMPRESSION: Extensive bilateral airspace opacities compatible with COVID pneumonia. Electronically Signed   By: Rolm Baptise M.D.   On: 01/05/2020 10:15    ____________________________________________   PROCEDURES and INTERVENTIONS  Procedure(s) performed (including Critical Care): .Critical Care Performed by: Vladimir Crofts, MD Authorized by: Vladimir Crofts, MD   Critical care provider statement:    Critical care time (minutes):  32   Critical care was necessary to treat or prevent imminent or life-threatening deterioration of the following conditions:  Respiratory failure   Critical care was time spent personally by me on the following activities:  Discussions with consultants, evaluation of patient's response to treatment, examination of patient, ordering and performing treatments and interventions, ordering and review of laboratory studies, ordering and review of radiographic studies, pulse oximetry, re-evaluation of patient's condition, obtaining history from patient or surrogate and review of old charts    Medications  remdesivir 200 mg in sodium chloride 0.9% 250 mL IVPB (200 mg Intravenous New Bag/Given 01/13/2020 1116)    Followed by  remdesivir 100 mg in sodium chloride 0.9 % 100 mL IVPB (has no administration in time range)  dexamethasone (DECADRON) injection 6 mg (6 mg Intravenous Given 01/31/2020 0953)  acetaminophen (TYLENOL) tablet 1,000 mg (1,000 mg Oral Given  01/20/2020 0950)  acetaminophen (TYLENOL) tablet 650 mg (has no administration in time range)  aspirin EC tablet 81 mg (has no administration in time range)  oxyCODONE (Oxy IR/ROXICODONE) immediate release tablet 5 mg (has no administration in time range)  diltiazem (CARDIZEM) tablet 60 mg (has no administration in time range)  torsemide (DEMADEX) tablet 10 mg (has no administration in time range)  sertraline (ZOLOFT) tablet 100 mg (has no administration in time range)  alum & mag hydroxide-simeth (MAALOX PLUS) 400-400-40 MG/5ML suspension 30 mL (has no administration in time range)  docusate sodium (COLACE) capsule 100 mg (has no administration in time range)  pantoprazole (PROTONIX) EC tablet 40 mg (has no administration in time range)  polyethylene glycol (MIRALAX / GLYCOLAX) packet 17 g (has no administration in time range)  gabapentin (NEURONTIN) capsule 300 mg (has no administration in time range)  methocarbamol (ROBAXIN) tablet 500 mg (has no administration in time range)  potassium chloride SA (KLOR-CON) CR tablet 10 mEq (has no administration in time range)  albuterol (VENTOLIN HFA) 108 (90 Base) MCG/ACT inhaler 2 puff (has no administration in time range)  benzonatate (TESSALON) capsule 100 mg (has no administration in time range)  fluticasone furoate-vilanterol (BREO ELLIPTA) 100-25 MCG/INH 1 puff (has no administration in time range)  montelukast (SINGULAIR) tablet 10 mg (has no administration in time range)  carboxymethylcellulose (REFRESH PLUS) 0.5 % ophthalmic solution 2 drop (has no administration in time range)  polyethylene glycol 0.4% and propylene glycol 0.3% (SYSTANE) ophthalmic gel (has no administration in time range)  diclofenac sodium (VOLTAREN) 1 % transdermal gel 4 g (has no administration in time range)  Lidocaine 4 % PTCH (has no administration in time range)  enoxaparin (LOVENOX) injection 40 mg (has no administration in time range)  sodium chloride flush (NS) 0.9 %  injection 3 mL (has no administration in time range)  sodium chloride flush (NS) 0.9 % injection 3 mL (has  no administration in time range)  0.9 %  sodium chloride infusion (has no administration in time range)  remdesivir 200 mg in sodium chloride 0.9% 250 mL IVPB (has no administration in time range)  REMDESIVIR 100 MG IN NS 100 ML (has no administration in time range)    ____________________________________________   MDM / ED COURSE  86 year old previously vaccinated patient presents to the ED hypoxic due to apparent breakthrough COVID-19 infection requiring medical admission.  Patient reportedly hypoxic to the 40s at her facility requiring NRB, otherwise hemodynamically stable with a fever.  Exam demonstrates a tachypneic and dyspneic patient who may only speak in short phrases due to her respiratory distress.  She has no signs of trauma, neurovascular deficits.  Benign abdomen.  CXR demonstrates extensive multifocal infiltrates consistent with known COVID-19 infection.  Blood work corroborates with lymphopenia.  Otherwise demonstrating a mild prerenal azotemia.  EKG is nonischemic.  Due to the severity of her illness, Decadron and remdesivir was initiated in the ED.  We will have RT come by and try to de-escalate the patient to high flow nasal cannula to continue to assist with her oxygenation, and we will admit the patient to hospitalist medicine for further work-up and management of her hypoxic condition.  Clinical Course as of Jan 18 1143  Wed Jan 18, 2020  1130 Reassessed.  Continues on NRB, awaiting respiratory therapy for stepdown to high flow nasal cannula.  Sats of 99%.  Tachypnea to the upper 20s.   [DS]  42 Spoke with admitting physician, Dr. Francine Graven who agrees to admit the patient   [DS]    Clinical Course User Index [DS] Vladimir Crofts, MD     ____________________________________________   FINAL CLINICAL IMPRESSION(S) / ED DIAGNOSES  Final diagnoses:  SOB (shortness of  breath)  Hypoxia  COVID-19  Fever in other diseases  Lymphopenia     ED Discharge Orders    None       Ana Schultz   Note:  This document was prepared using Dragon voice recognition software and may include unintentional dictation errors.   Vladimir Crofts, MD 01/21/2020 1149

## 2020-01-18 NOTE — ED Triage Notes (Signed)
Arrives to ED from [facility] c/o SOB. Staff found pt oxygen to be in mid 40's on RA. Pt known COVID+. Pt arrives to Ed via EMS on 15L NRB with oxygen 98%.

## 2020-01-18 NOTE — H&P (Signed)
History and Physical    Ana Schultz OTL:[id number] DOB: [birth year] DOA: 01/16/2020  PCP: Juluis Pitch, MD   Patient coming from: [place]  I have personally briefly reviewed patient's old medical records in [place]  Chief Complaint: Shortness of breath Most of the history was obtained from ER notes.  Patient is confused unable to provide any history  HPI: Ana Schultz is a 86 y.o. female with medical history significant for CVA with residual left-sided weakness, history of recurrent falls currently wheelchair dependent, history of chronic respiratory failure on 3 L of oxygen, history of COPD, hypertension and stage III chronic kidney disease who was brought into the ER by EMS after she was found hypoxic, patient was noted to have room air pulse oximetry in the 40s and was placed on a nonrebreather mask, 15L with improvement in her pulse oximetry to 98% Patient tested positive for Covid within the last week and is fully vaccinated with mRNA vaccine from January/February 2021. Upon arrival to the ER patient had a fever with a T-max of 101F and was tachypneic with respiratory rate and was tachypneic with respiratory rate of 32. Labs show sodium of 138, potassium 4.7, chloride 96, bicarb 24, BUN 37, creatinine 1.4, calcium 8.1, albumin 3.6, AST 29, ALT 13, BNP 531, troponin 54, lactic acid 1.2, white count 2.9, hemoglobin 9.6, hematocrit 29.3, MCV 86.4, RDW 13.6, platelet count 186 Chest x-ray reviewed by me shows extensive bilateral airspace opacities consistent with Covid pneumonia Twelve-lead EKG reviewed by me shows sinus rhythm with PACs.    ED Course: Patient is a 86 year old female who resides in a skilled nursing facility and was sent to the ER for evaluation of shortness of breath and hypoxia.  Patient was noted to have room air pulse oximetry of 40% and was placed on a nonrebreather mask, 15L with improvement in her pulse oximetry to 98%. Chest  x-ray is consistent with Covid pneumonia Patient received 2 doses of the COVID-19 vaccine in January.  She will be admitted to the hospital for further evaluation.  Review of Systems: As per HPI otherwise 10 point review of systems negative.    Past Medical History:  Diagnosis Date  . Adenoma of rectum   . Adrenal adenoma   . Anemia   . Anemia, unspecified   . Arthritis   . Ascending aortic aneurysm ([place])    a. s/p repair 07/2014  . Atrophic vaginitis   . Benign neoplasm of colon, unspecified   . COPD (chronic obstructive pulmonary disease) ([place])   . Coronary artery disease, non-occlusive    a. LHC 06/2014 without evidence of obstructive disease  . Depression   . Diverticulosis   . Dysphagia   . Dysrhythmia   . Essential hypertension    benign  . Frequent UTI   . GERD (gastroesophageal reflux disease)   . Hemiplegia and hemiparesis    following cerebral infarction affecting left non-dominant side  . Hypertension   . Microscopic hematuria   . Nonrheumatic aortic valve insufficiency   . Osteoporosis    unspecified  . Panic attacks   . Paroxysmal atrial fibrillation (HCC)   . Pernicious anemia   . Renal cyst    CKD STAGE 3  . Stroke ([place]) 07/23/2014   a. post-operative setting in 07/2014 leading to left UE hemiapresis and left LE weakness  . Urge incontinence     Past Surgical History:  Procedure Laterality Date  . ABDOMINAL HYSTERECTOMY  1972  .  AORTOILIAC BYPASS    . ASCENDING AORTIC ANEURYSM REPAIR  07/23/2014   07/2014  . BLEPHAROPLASTY    . CATARACT EXTRACTION  2005  . GASTROSTOMY W/ FEEDING TUBE    . INTRAMEDULLARY (IM) NAIL INTERTROCHANTERIC Left 02/26/2016   Procedure: INTRAMEDULLARY (IM) NAIL INTERTROCHANTRIC;  Surgeon: Earnestine Leys, MD;  Location: [facility] ORS;  Service: Orthopedics;  Laterality: Left;  . KYPHOPLASTY N/A 08/26/2016   Procedure: KYPHOPLASTY T12;  Surgeon: Hessie Knows, MD;  Location: [facility] ORS;  Service: Orthopedics;  Laterality: N/A;  .  KYPHOPLASTY N/A 06/17/2018   Procedure: L5 KYPHOPLASTY;  Surgeon: Hessie Knows, MD;  Location: [facility] ORS;  Service: Orthopedics;  Laterality: N/A;  . LEFT HEART CATH  06/2014  . PR ASCEND AORTIC GRAFT INCL VALVE SUSPENSION  07/07/2014   Procedure: ASCENDING AORTA GRAFT, WITH CARDIOPULMONARY BYPASS, WITH OR WITHOUT VALVE SUSPENSION; Surgeon: Dwaine Deter, MD; Location: MAIN OR [facility]; Service: Cardiothoracic  . PR LAP, GASTROTOMY W/O TUBE CONSTR  08/08/2014   Procedure: LAPAROSCOPY, SURGICAL; GASTOSTOMY W/O CONSTRUCTION OF GASTRIC TUBE (EG, STAMM PROCEDURE)(SEPARATE PROCED); Surgeon: Fredrik Rigger, MD; Location: MAIN OR [facility]; Service: Gastrointestinal  . PR PLACE PERCUT GASTROSTOMY TUBE  08/08/2014   Procedure: UGI ENDO; W/DIRECTED PLCMT PERQ GASTROSTOMY TUBE; Surgeon: Fredrik Rigger, MD; Location: MAIN [facility]; Service: Gastrointestinal     reports that she has quit smoking. Her smoking use included cigarettes. She has a 12.50 pack-year smoking history. She has never used smokeless tobacco. She reports that she does not drink alcohol and does not use drugs.  Allergies  Allergen Reactions  . Iodinated Diagnostic Agents Itching and Swelling    Other reaction(s): Unknown  . Nitrofurantoin Diarrhea    Other reaction(s): Unknown  . Omnipaque [Iohexol]   . Solifenacin Other (See Comments)    indigestion  . Ciprofloxacin Other (See Comments) and Rash    Colitis  . Metronidazole Itching and Rash  . Sulfa Antibiotics Itching and Rash    Family History  Problem Relation Age of Onset  . Lung cancer Son   . CAD Father   . Heart attack Father   . CAD Brother   . Heart attack Brother      Prior to Admission medications   Medication Sig Start Date End Date Taking? Authorizing Provider  acetaminophen (TYLENOL) 325 MG tablet Take 2 tablets (650 mg total) by mouth 2 (two) times daily. 08/02/18   Medina-Vargas, Monina C, NP  albuterol (PROVENTIL HFA;VENTOLIN HFA) 108 (90  Base) MCG/ACT inhaler Inhale 2 puffs into the lungs every 4 (four) hours as needed for wheezing or shortness of breath. 11/09/15   Flora Lipps, MD  alum & mag hydroxide-simeth (MAALOX PLUS) 400-400-40 MG/5ML suspension Take 30 mLs by mouth every 4 (four) hours as needed.    [provider]  aspirin EC 81 MG tablet Take 81 mg by mouth daily.    [provider]  azithromycin (ZITHROMAX Z-PAK) 250 MG tablet Take 2 tablets (500 mg) on  Day 1,  followed by 1 tablet (250 mg) once daily on Days 2 through 5. 01/13/20 01/08/2020  Scarboro, Audie Clear, NP  benzonatate (TESSALON PERLES) 100 MG capsule Take 1 capsule (100 mg total) by mouth 3 (three) times daily as needed for cough. 01/13/20 01/12/21  Scarboro, Audie Clear, NP  BREO ELLIPTA 100-25 MCG/INH AEPB Inhale 1 puff into the lungs daily. Rinse mouth well after use 08/18/16   [provider]  carboxymethylcellulose (REFRESH TEARS) 0.5 % SOLN Place 2 drops into both eyes.  1 - 4 times daily as needed    [provider]  diclofenac sodium (VOLTAREN) 1 % GEL Apply 4 g topically 3 (three) times daily as needed. Apply to bilateral hips and knees every 8 hours as needed for pain 03/18/18   [provider]  diltiazem (CARDIZEM) 60 MG tablet Take 60 mg by mouth daily.    [provider]  docusate sodium (COLACE) 100 MG capsule Take 1 capsule (100 mg total) by mouth 2 (two) times daily. 03/01/16   Fritzi Mandes, MD  gabapentin (NEURONTIN) 300 MG capsule Take 300 mg by mouth every 12 (twelve) hours.  06/07/18   [provider]  ipratropium-albuterol (DUONEB) 0.5-2.5 (3) MG/3ML SOLN Take 3 mLs by nebulization 3 (three) times daily.     [provider]  Lidocaine 4 % PTCH Apply 1 patch daily to right Flank / sore ribs.  Remove after 12 hours    [provider]  loperamide (IMODIUM) 2 MG capsule Take 2 mg by mouth as needed for diarrhea or loose stools.    [provider]  magnesium hydroxide (MILK OF  MAGNESIA) 400 MG/5ML suspension Take 30 mLs by mouth daily as needed for mild constipation.    [provider]  methocarbamol (ROBAXIN) 500 MG tablet Take 1 tablet (500 mg total) by mouth every 8 (eight) hours as needed for muscle spasms. 07/19/18   Medina-Vargas, Monina C, NP  montelukast (SINGULAIR) 10 MG tablet Take 10 mg by mouth at bedtime.     [provider]  oxyCODONE (ROXICODONE) 5 MG immediate release tablet Take 1 tablet (5 mg total) by mouth every 12 (twelve) hours as needed for moderate pain or severe pain. 08/02/18   Medina-Vargas, Monina C, NP  OXYGEN Inhale 2 L into the lungs continuous as needed.    [provider]  pantoprazole (PROTONIX) 40 MG tablet Take 40 mg by mouth daily.    [provider]  Polyethyl Glycol-Propyl Glycol (SYSTANE) 0.4-0.3 % GEL ophthalmic gel Place 1 application into both eyes at bedtime. (2 drops ) for dry eyes (Ointments are on Backorder)     [provider]  polyethylene glycol (MIRALAX / GLYCOLAX) packet Take 17 g by mouth 2 (two) times daily as needed.    [provider]  potassium chloride (K-DUR,KLOR-CON) 10 MEQ tablet Take 10 mEq by mouth daily.  03/05/18   [provider]  predniSONE (DELTASONE) 5 MG tablet Take 5 mg by mouth daily with breakfast.  04/15/18   [provider]  senna-docusate (SENNA-PLUS) 8.6-50 MG tablet Take 2 tablets by mouth 2 (two) times daily.     [provider]  sertraline (ZOLOFT) 100 MG tablet Take 100 mg by mouth daily.     [provider]  sertraline (ZOLOFT) 50 MG tablet Take 50 mg by mouth daily.     [provider]  Skin Protectants, Misc. (MINERIN) CREA Apply liberal amount daily to arms and legs after the bath and PRN for skin integrity    [provider]  sodium phosphate Pediatric (FLEET) 3.5-9.5 GM/59ML enema Place 1 enema rectally once as needed for severe constipation. Take once every 3 days if constipation is  not relieved by Milk of magnesia or Bisacodyl Suppository    [provider]  torsemide (DEMADEX) 10 MG tablet Take 10 mg by mouth daily.  02/16/18   [provider]  UNABLE TO FIND Diet Type: NAS    [provider]    Physical  Exam: Vitals:   01/04/2020 0934 01/26/2020 0935 01/23/2020 1000  BP: (!) 162/65  (!) 160/60  Pulse: 88  86  Resp: (!) 32  (!) 25  Temp: (!) 101.1 F (38.4 C)    TempSrc: Oral    SpO2: 100%  100%  Weight:  54.4 kg   Height:  5\' 2"  (1.575 m)      Vitals:   01/21/2020 0934 01/27/2020 0935 01/12/2020 1000  BP: (!) 162/65  (!) 160/60  Pulse: 88  86  Resp: (!) 32  (!) 25  Temp: (!) 101.1 F (38.4 C)    TempSrc: Oral    SpO2: 100%  100%  Weight:  54.4 kg   Height:  5\' 2"  (1.575 m)     Constitutional: NAD, alert and oriented to person and place. Ecchymoses over the left eye (POA) Eyes: PERRL, lids and conjunctivae pallor ENMT: Mucous membranes are moist.  Neck: normal, supple, no masses, no thyromegaly Respiratory: air movement in all lung fields, no wheezing, no crackles. Normal respiratory effort. No accessory muscle use.  Cardiovascular: Regular rate and rhythm, no murmurs / rubs / gallops. No extremity edema. 2+ pedal pulses. No carotid bruits.  Abdomen: no tenderness, no masses palpated. No hepatosplenomegaly. Bowel sounds positive.  Musculoskeletal: no clubbing / cyanosis. No joint deformity upper and lower extremities.  Skin: Ecchymoses over lower extremities from fall Neurologic: No gross focal neurologic deficit. Psychiatric: Normal mood and affect.   Labs on Admission: I have personally reviewed following labs and imaging studies  CBC: Recent Labs  Lab 01/27/2020 0937  WBC 2.9*  NEUTROABS 2.2  HGB 9.6*  HCT 29.2*  MCV 86.4  PLT 956   Basic Metabolic Panel: Recent Labs  Lab 01/29/2020 0937  NA 138  K 4.2  CL 102  CO2 24  GLUCOSE 113*  BUN 37*  CREATININE 1.41*  CALCIUM 8.1*   GFR: Estimated Creatinine  Clearance: 20.1 mL/min (A) (by C-G formula based on SCr of 1.41 mg/dL (H)). Liver Function Tests: Recent Labs  Lab 01/30/2020 0937  AST 29  ALT 13  ALKPHOS 65  BILITOT 0.8  PROT 6.4*  ALBUMIN 3.6   No results for input(s): LIPASE, AMYLASE in the last 168 hours. No results for input(s): AMMONIA in the last 168 hours. Coagulation Profile: No results for input(s): INR, PROTIME in the last 168 hours. Cardiac Enzymes: No results for input(s): CKTOTAL, CKMB, CKMBINDEX, TROPONINI in the last 168 hours. BNP (last 3 results) No results for input(s): PROBNP in the last 8760 hours. HbA1C: No results for input(s): HGBA1C in the last 72 hours. CBG: No results for input(s): GLUCAP in the last 168 hours. Lipid Profile: No results for input(s): CHOL, HDL, LDLCALC, TRIG, CHOLHDL, LDLDIRECT in the last 72 hours. Thyroid Function Tests: No results for input(s): TSH, T4TOTAL, FREET4, T3FREE, THYROIDAB in the last 72 hours. Anemia Panel: No results for input(s): VITAMINB12, FOLATE, FERRITIN, TIBC, IRON, RETICCTPCT in the last 72 hours. Urine analysis:    Component Value Date/Time   COLORURINE STRAW (A) 11/15/2019 1627   APPEARANCEUR CLEAR (A) 11/15/2019 1627   LABSPEC 1.008 11/15/2019 1627   PHURINE 6.0 11/15/2019 1627   GLUCOSEU NEGATIVE 11/15/2019 1627   HGBUR NEGATIVE 11/15/2019 1627   BILIRUBINUR NEGATIVE 11/15/2019 1627   KETONESUR NEGATIVE 11/15/2019 1627   PROTEINUR NEGATIVE 11/15/2019 1627   NITRITE NEGATIVE 11/15/2019 1627   LEUKOCYTESUR TRACE (A) 11/15/2019 1627    Radiological Exams on Admission: DG Chest Portable 1 View  Result Date: 01/07/2020  CLINICAL DATA:  COVID positive, hypoxic EXAM: PORTABLE CHEST 1 VIEW COMPARISON:  01/13/2019 FINDINGS: Extensive bilateral airspace disease, new since prior study. Appearance is compatible with COVID pneumonia. Heart is borderline in size. Diffuse aortic atherosclerosis. No effusions or pneumothorax. IMPRESSION: Extensive bilateral  airspace opacities compatible with COVID pneumonia. Electronically Signed   By: Rolm Baptise M.D.   On: 01/17/2020 10:15    EKG: Independently reviewed.  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  Pulse oximetry on oxygen is 98% Patient is on 2 L of oxygen at baseline We will place patient on remdesivir per protocol Place patient on IV Solu-Medrol Supportive care with as needed bronchodilator therapy and antitussives Continue oxygen supplementation to maintain pulse oximetry greater than 92%   History of COPD with chronic respiratory failure Patient has a history of COPD and is usually on 2 L of oxygen continuous Continue inhaled steroids    History of paroxysmal A. Fib Continue Cardizem for rate control Patient not on long-term anticoagulation therapy due to history of recurrent falls    History of CVA with left-sided hemiparesis Patient requires assistance with activities of daily living Continue aspirin   Depression Continue sertraline       DVT prophylaxis: Lovenox Code Status: Full  code Family Communication: Greater than 50% of time was spent discussing patient's plan of care with her daughter over the phone Deborah Lingerfelt.  All questions and concerns were addressed.  She verbalized understanding and agrees with the plan.  CODE STATUS was discussed and she is a full code Disposition Plan: Back to previous home environment Consults called: None.    Collier Bullock MD [facility]     01/29/2020, 12:18 PM

## 2020-01-18 NOTE — Progress Notes (Signed)
PHARMACIST - PHYSICIAN COMMUNICATION  CONCERNING:  Enoxaparin (Lovenox) for DVT Prophylaxis    RECOMMENDATION: Patient was prescribed enoxaprin 40mg  q24 hours for VTE prophylaxis.   Filed Weights   01/05/2020 0935  Weight: 54.4 kg (120 lb)    Body mass index is 21.95 kg/m.  Estimated Creatinine Clearance: 20.1 mL/min (A) (by C-G formula based on SCr of 1.41 mg/dL (H)).   Patient is candidate for enoxaparin 30mg  every 24 hours based on CrCl <18ml/min or Weight <45kg  DESCRIPTION: Pharmacy has adjusted enoxaparin dose per [facility] policy.  Patient is now receiving enoxaparin 30 mg every 24 hours    Rowland Lathe, PharmD Clinical Pharmacist  01/09/2020 5:04 PM

## 2020-01-19 ENCOUNTER — Other Ambulatory Visit: Payer: Self-pay

## 2020-01-19 ENCOUNTER — Inpatient Hospital Stay: Payer: Medicare Other

## 2020-01-19 DIAGNOSIS — Z7189 Other specified counseling: Secondary | ICD-10-CM

## 2020-01-19 DIAGNOSIS — Z515 Encounter for palliative care: Secondary | ICD-10-CM

## 2020-01-19 LAB — COMPREHENSIVE METABOLIC PANEL
ALT: 14 U/L (ref 0–44)
AST: 32 U/L (ref 15–41)
Albumin: 3.4 g/dL — ABNORMAL LOW (ref 3.5–5.0)
Alkaline Phosphatase: 64 U/L (ref 38–126)
Anion gap: 13 (ref 5–15)
BUN: 41 mg/dL — ABNORMAL HIGH (ref 8–23)
CO2: 23 mmol/L (ref 22–32)
Calcium: 8.4 mg/dL — ABNORMAL LOW (ref 8.9–10.3)
Chloride: 106 mmol/L (ref 98–111)
Creatinine, Ser: 1.22 mg/dL — ABNORMAL HIGH (ref 0.44–1.00)
GFR calc Af Amer: 45 mL/min — ABNORMAL LOW (ref >60)
GFR calc non Af Amer: 38 mL/min — ABNORMAL LOW (ref >60)
Glucose, Bld: 108 mg/dL — ABNORMAL HIGH (ref 70–99)
Potassium: 5.1 mmol/L (ref 3.5–5.1)
Sodium: 142 mmol/L (ref 135–145)
Total Bilirubin: 0.9 mg/dL (ref 0.3–1.2)
Total Protein: 6 g/dL — ABNORMAL LOW (ref 6.5–8.1)

## 2020-01-19 LAB — CBC WITH DIFFERENTIAL/PLATELET
Abs Immature Granulocytes: 0.08 10*3/uL — ABNORMAL HIGH (ref 0.00–0.07)
Basophils Absolute: 0 10*3/uL (ref 0.0–0.1)
Basophils Relative: 0 %
Eosinophils Absolute: 0 10*3/uL (ref 0.0–0.5)
Eosinophils Relative: 0 %
HCT: 29.5 % — ABNORMAL LOW (ref 36.0–46.0)
Hemoglobin: 9.4 g/dL — ABNORMAL LOW (ref 12.0–15.0)
Immature Granulocytes: 3 %
Lymphocytes Relative: 16 %
Lymphs Abs: 0.4 10*3/uL — ABNORMAL LOW (ref 0.7–4.0)
MCH: 28.3 pg (ref 26.0–34.0)
MCHC: 31.9 g/dL (ref 30.0–36.0)
MCV: 88.9 fL (ref 80.0–100.0)
Monocytes Absolute: 0.1 10*3/uL (ref 0.1–1.0)
Monocytes Relative: 5 %
Neutro Abs: 1.8 10*3/uL (ref 1.7–7.7)
Neutrophils Relative %: 76 %
Platelets: 200 10*3/uL (ref 150–400)
RBC: 3.32 MIL/uL — ABNORMAL LOW (ref 3.87–5.11)
RDW: 14.5 % (ref 11.5–15.5)
Smear Review: NORMAL
WBC: 2.4 10*3/uL — ABNORMAL LOW (ref 4.0–10.5)
nRBC: 0 % (ref 0.0–0.2)

## 2020-01-19 LAB — MAGNESIUM: Magnesium: 2.3 mg/dL (ref 1.7–2.4)

## 2020-01-19 LAB — PHOSPHORUS: Phosphorus: 3.8 mg/dL (ref 2.5–4.6)

## 2020-01-19 LAB — FIBRIN DERIVATIVES D-DIMER (ARMC ONLY): Fibrin derivatives D-dimer (ARMC): 4024.81 ng/mL (FEU) — ABNORMAL HIGH (ref 0.00–499.00)

## 2020-01-19 LAB — C-REACTIVE PROTEIN: CRP: 16.2 mg/dL — ABNORMAL HIGH (ref <1.0)

## 2020-01-19 LAB — FERRITIN: Ferritin: 269 ng/mL (ref 11–307)

## 2020-01-19 MED ORDER — BARICITINIB 2 MG PO TABS: 4.0000 mg | ORAL_TABLET | Freq: Every day | ORAL | Status: DC

## 2020-01-19 MED ORDER — SERTRALINE HCL 50 MG PO TABS: 150.0000 mg | ORAL_TABLET | Freq: Every day | ORAL | Status: DC

## 2020-01-19 MED ORDER — BARICITINIB 2 MG PO TABS
2.0000 mg | ORAL_TABLET | Freq: Every day | ORAL | Status: DC
  Administered 2020-01-19 – 2020-01-21 (×3): 2 mg via ORAL
  Filled 2020-01-19 (×3): qty 1

## 2020-01-19 MED ORDER — DOCUSATE SODIUM 50 MG/5ML PO LIQD
100.0000 mg | Freq: Two times a day (BID) | ORAL | Status: DC
  Administered 2020-01-20 – 2020-01-21 (×3): 100 mg via ORAL
  Filled 2020-01-19 (×6): qty 10

## 2020-01-19 MED ORDER — SERTRALINE HCL 50 MG PO TABS
150.0000 mg | ORAL_TABLET | Freq: Every day | ORAL | Status: DC
  Administered 2020-01-19 – 2020-01-21 (×3): 150 mg via ORAL
  Filled 2020-01-19 (×3): qty 3

## 2020-01-19 MED ORDER — ROPINIROLE HCL 0.25 MG PO TABS
0.2500 mg | ORAL_TABLET | Freq: Every day | ORAL | Status: DC
  Administered 2020-01-19 – 2020-01-21 (×3): 0.25 mg via ORAL
  Filled 2020-01-19 (×4): qty 1

## 2020-01-19 NOTE — Progress Notes (Signed)
Pt POA and point of contact is to be pts grandson Immunologist. Phone # [phone]

## 2020-01-19 NOTE — Plan of Care (Signed)

## 2020-01-19 NOTE — Progress Notes (Signed)
Speech in room and said pts sats were 86% pt on hf at 15 l   Went to room and pt was eating breakfast  Alert.  resp informed  Re pts sats  86-87. Will come see her. May have to add a nonrebreather if she does not go back up

## 2020-01-19 NOTE — Progress Notes (Signed)
PROGRESS NOTE    Ana Schultz  HYW:[id number] DOB: December 14, 1925 DOA: 01/23/2020 PCP: Juluis Pitch, MD   Brief Narrative:  HPI: Ana Schultz is a 86 y.o. female with medical history significant for CVA with residual left-sided weakness, history of recurrent falls currently wheelchair dependent, history of chronic respiratory failure on 3 L of oxygen, history of COPD, hypertension and stage III chronic kidney disease who was brought into the ER by EMS after she was found hypoxic, patient was noted to have room air pulse oximetry in the 40s and was placed on a nonrebreather mask, 15L with improvement in her pulse oximetry to 98% Patient tested positive for Covid within the last week and is fully vaccinated with mRNA vaccine from January/February 2021. Upon arrival to the ER patient had a fever with a T-max of 101F and was tachypneic with respiratory rate and was tachypneic with respiratory rate of 32. Labs show sodium of 138, potassium 4.7, chloride 96, bicarb 24, BUN 37, creatinine 1.4, calcium 8.1, albumin 3.6, AST 29, ALT 13, BNP 531, troponin 54, lactic acid 1.2, white count 2.9, hemoglobin 9.6, hematocrit 29.3, MCV 86.4, RDW 13.6, platelet count 186 Chest x-ray reviewed by me shows extensive bilateral airspace opacities consistent with Covid pneumonia Twelve-lead EKG reviewed by me shows sinus rhythm with PACs.  ED Course: Patient is a 86 year old female who resides in a skilled nursing facility and was sent to the ER for evaluation of shortness of breath and hypoxia.  Patient was noted to have room air pulse oximetry of 40% and was placed on a nonrebreather mask, 15L with improvement in her pulse oximetry to 98%. Chest x-ray is consistent with Covid pneumonia Patient received 2 doses of the COVID-19 vaccine in January.  She will be admitted to the hospital for further evaluation.  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 Patient resides in a skilled nursing facility and received the COVID-19 vaccine in Jan/Feb She tested positive for the COVID-19 virus about a week ago and then here in the hospital. Patient had room air pulse oximetry of 40% at the scene and this improved following oxygen supplementation with a nonrebreather mask at 15 L.  She uses 2 L of nasal oxygen at baseline due to her COPD.  She still remains on 15 L high flow oxygen.  She does not seem to have COPD exacerbation at this point in time. Continue following: Remdesivir per pharmacy protocol, IV Solu-Medrol Supportive treatment, antitussive. Zinc and vitamin C. Patient was encouraged to prone, out of bed to chair, to use incentive spirometry and flutter valve. Actemra/Baricitinib  off label use - patient was told that if COVID-19 pneumonitis gets worse we might potentially use Actemra/ barititinib off label, patient denies any known history of active diverticulitis, tuberculosis or hepatitis, she deferred her decision to her daughter.  I had a long discussion with her daughter separately and discussed all the risks and benefits of Actemra/baricitinib however she stated that she would  like to talk to one of her family member who happens to have medical background and will get back to me on that.  History of paroxysmal A. Fib: Continue Cardizem for rate control Patient not on long-term anticoagulation therapy due to history of recurrent  falls  History of CVA with left-sided hemiparesis Patient requires assistance with activities of daily living Continue aspirin  Depression Continue sertraline  CKD stage IIIb: At baseline.  Monitor.  DVT prophylaxis: enoxaparin (LOVENOX) injection 30 mg Start: 01/21/2020 2200   Code Status: Full Code  Family Communication: None present at bedside.  Plan of care discussed with patient in length and he verbalized understanding and agreed with it.  I also called and discussed in length with her daughter [name] over the phone.  Status is: Inpatient  Remains inpatient appropriate because:Inpatient level of care appropriate due to severity of illness   Dispo: The patient is from: SNF              Anticipated d/c is to: SNF              Anticipated d/c date is: > 3 days              Patient currently is not medically stable to d/c.        Estimated body mass index is 21.01 kg/m as calculated from the following:   Height as of this encounter: 5\' 2"  (1.575 m).   Weight as of this encounter: 52.1 kg.      Nutritional status:               Consultants:   None  Procedures:   None  Antimicrobials:  Anti-infectives (From admission, onward)   Start     Dose/Rate Route Frequency Ordered Stop   01/19/20 1000  remdesivir 100 mg in sodium chloride 0.9 % 100 mL IVPB       "Followed by" Linked Group Details   100 mg 200 mL/hr over 30 Minutes Intravenous Daily 01/17/2020 0958 01/23/20 0959   01/19/20 1000  remdesivir 100 mg in sodium chloride 0.9 % 100 mL IVPB  Status:  Discontinued       "Followed by" Linked Group Details   100 mg 200 mL/hr over 30 Minutes Intravenous Daily 01/21/2020 1144 01/10/2020 1148   01/30/2020 1145  remdesivir 200 mg in sodium chloride 0.9% 250 mL IVPB  Status:  Discontinued       "Followed by" Linked Group Details   200 mg 580 mL/hr over 30 Minutes Intravenous Once 01/13/2020 1144 01/29/2020 1148   01/11/2020 1030  remdesivir 200 mg in sodium chloride  0.9% 250 mL IVPB       "Followed by" Linked Group Details   200 mg 580 mL/hr over 30 Minutes Intravenous Once 01/04/2020 0958 01/21/2020 1248         Subjective: Seen and examined.  Still on 50 L high flow oxygen but able to speak in full sentences and she is alert and oriented and denied any shortness of breath.  Objective: Vitals:   01/06/2020 1930 01/30/2020 2056 01/19/20 0100 01/19/20 0410  BP: (!) 139/51 (!) 170/53  (!) 144/54  Pulse: 76 73  73  Resp: (!) 25 20  20   Temp:  98 F (36.7 C)  97.7 F (36.5 C)  TempSrc:    Oral  SpO2: 94% 98% 99% 96%  Weight:    52.1 kg  Height:    5\' 2"  (1.575 m)   No intake or output  data in the 24 hours ending 01/19/20 0744 Filed Weights   01/09/2020 0935 01/19/20 0410  Weight: 54.4 kg 52.1 kg    Examination:  General exam: Appears calm and comfortable  Respiratory system: Bibasilar rhonchi and some coarse breath sounds. Respiratory effort normal. Cardiovascular system: S1 & S2 heard, RRR. No JVD, murmurs, rubs, gallops or clicks. No pedal edema. Gastrointestinal system: Abdomen is nondistended, soft and nontender. No organomegaly or masses felt. Normal bowel sounds heard. Central nervous system: Alert and oriented. No focal neurological deficits. Extremities: Symmetric 5 x 5 power. Skin: No rashes, lesions or ulcers Psychiatry: Judgement and insight appear normal. Mood & affect appropriate.    Data Reviewed: I have personally reviewed following labs and imaging studies  CBC: Recent Labs  Lab 01/19/2020 0937 01/19/20 0518  WBC 2.9* 2.4*  NEUTROABS 2.2 PENDING  HGB 9.6* 9.4*  HCT 29.2* 29.5*  MCV 86.4 88.9  PLT 186 161   Basic Metabolic Panel: Recent Labs  Lab 01/16/2020 0937 01/19/20 0518  NA 138 142  K 4.2 5.1  CL 102 106  CO2 24 23  GLUCOSE 113* 108*  BUN 37* 41*  CREATININE 1.41* 1.22*  CALCIUM 8.1* 8.4*  MG  --  2.3  PHOS  --  3.8   GFR: Estimated Creatinine Clearance: 23.3 mL/min (A) (by C-G formula based on SCr of  1.22 mg/dL (H)). Liver Function Tests: Recent Labs  Lab 01/17/2020 0937 01/19/20 0518  AST 29 32  ALT 13 14  ALKPHOS 65 64  BILITOT 0.8 0.9  PROT 6.4* 6.0*  ALBUMIN 3.6 3.4*   No results for input(s): LIPASE, AMYLASE in the last 168 hours. No results for input(s): AMMONIA in the last 168 hours. Coagulation Profile: No results for input(s): INR, PROTIME in the last 168 hours. Cardiac Enzymes: No results for input(s): CKTOTAL, CKMB, CKMBINDEX, TROPONINI in the last 168 hours. BNP (last 3 results) No results for input(s): PROBNP in the last 8760 hours. HbA1C: No results for input(s): HGBA1C in the last 72 hours. CBG: No results for input(s): GLUCAP in the last 168 hours. Lipid Profile: No results for input(s): CHOL, HDL, LDLCALC, TRIG, CHOLHDL, LDLDIRECT in the last 72 hours. Thyroid Function Tests: No results for input(s): TSH, T4TOTAL, FREET4, T3FREE, THYROIDAB in the last 72 hours. Anemia Panel: Recent Labs    01/19/20 0518  FERRITIN 269   Sepsis Labs: Recent Labs  Lab 01/09/2020 0960  LATICACIDVEN 1.2    Recent Results (from the past 240 hour(s))  SARS Coronavirus 2 by RT PCR (hospital order, performed in [facility] hospital lab) Nasopharyngeal Nasopharyngeal Swab     Status: Abnormal   Collection Time: 01/25/2020  9:56 AM   Specimen: Nasopharyngeal Swab  Result Value Ref Range Status   SARS Coronavirus 2 POSITIVE (A) NEGATIVE Final    Comment: RESULT CALLED TO, READ BACK BY AND VERIFIED WITH:  Verlene Mayer [id number] 01/11/2020 SDR (NOTE) SARS-CoV-2 target nucleic acids are DETECTED  SARS-CoV-2 RNA is generally detectable in upper respiratory specimens  during the acute phase of infection.  Positive results are indicative  of the presence of the identified virus, but do not rule out bacterial infection or co-infection with other pathogens not detected by the test.  Clinical correlation with patient history and  other diagnostic information is necessary to determine  patient infection status.  The expected result is negative.  Fact Sheet for Patients:   StrictlyIdeas.no   Fact Sheet for Healthcare Providers:   BankingDealers.co.za    This  test is not yet approved or cleared by the [place] and  has been authorized for detection and/or diagnosis of SARS-CoV-2 by FDA under an Emergency Use Authorization (EUA).  This EUA will remain in effect (meaning this test  can be used) for the duration of  the COVID-19 declaration under Section 564(b)(1) of the Act, 21 U.S.C. section 360-bbb-3(b)(1), unless the authorization is terminated or revoked sooner.  Performed at [facility], [address] [place], [place] [address]       Radiology Studies: DG Chest Portable 1 View  Result Date: 01/21/2020 CLINICAL DATA:  COVID positive, hypoxic EXAM: PORTABLE CHEST 1 VIEW COMPARISON:  01/13/2019 FINDINGS: Extensive bilateral airspace disease, new since prior study. Appearance is compatible with COVID pneumonia. Heart is borderline in size. Diffuse aortic atherosclerosis. No effusions or pneumothorax. IMPRESSION: Extensive bilateral airspace opacities compatible with COVID pneumonia. Electronically Signed   By: Rolm Baptise M.D.   On: 01/10/2020 10:15    Scheduled Meds: . acetaminophen  650 mg Oral BID  . vitamin C  500 mg Oral Daily  . aspirin EC  81 mg Oral Daily  . diltiazem  60 mg Oral Daily  . docusate sodium  100 mg Oral BID  . enoxaparin (LOVENOX) injection  30 mg Subcutaneous Q24H  . fluticasone furoate-vilanterol  1 puff Inhalation Daily  . gabapentin  300 mg Oral Q12H  . lidocaine   Transdermal Daily  . methylPREDNISolone (SOLU-MEDROL) injection  30 mg Intravenous Q12H   Followed by  . [START ON 02-07-2020] predniSONE  50 mg Oral Daily  . montelukast  10 mg Oral QHS  . pantoprazole  40 mg Oral Daily  . potassium chloride  10 mEq Oral Daily  . sertraline  100 mg Oral Daily  .  sodium chloride flush  3 mL Intravenous Q12H  . torsemide  10 mg Oral Daily  . zinc sulfate  220 mg Oral Daily   Continuous Infusions: . sodium chloride    . remdesivir 100 mg in NS 100 mL       LOS: 1 day   Time spent: 38 minutes   Darliss Cheney, MD [facility]  01/19/2020, 7:44 AM   To contact the attending provider between 7A-7P or the covering provider during after hours 7P-7A, please log into the web site [email or web address].

## 2020-01-19 NOTE — Progress Notes (Signed)
Called pts gson and updated on condition.  He was upset we had spoken with dtr

## 2020-01-19 NOTE — Consult Note (Signed)
Consultation Note Date: 01/19/2020   Patient Name: Ana Schultz  DOB: 01/09/1926  MRN: [id number]  Age / Sex: 86 y.o., female  PCP: Juluis Pitch, MD Referring Physician: Darliss Cheney, MD  Reason for Consultation: Establishing goals of care  HPI/Patient Profile: 86 y.o. female  with past medical history of CVA w/ residual L-sided weakness, recurrent falls, COPD, HTN, CKD, and dementia admitted on 01/10/2020 with COVID-19 pneumonia. Requiring high amounts of oxygen.  Patient fully vaccinated Jan/Feb. PMT consulted for [place] discussion.   Clinical Assessment and Goals of Care: I have reviewed medical records including EPIC notes, labs and imaging, received report from RN, assessed the patient and then met with patient  to discuss diagnosis prognosis, GOC, EOL wishes, disposition and options.  I introduced Palliative Medicine as specialized medical care for people living with serious illness. It focuses on providing relief from the symptoms and stress of a serious illness. The goal is to improve quality of life for both the patient and the family.  As far as functional and nutritional status, patient tells me she is wheelchair dependent at her facility. Tells me she has been doing okay. Good appetite per her report.    We discussed patient's current illness and what it means in the larger context of patient's on-going co-morbidities. She understands she is very ill and requiring high amounts of oxygen. She does not feel uncomfortable or short of breath.   I attempted to elicit values and goals of care important to the patient.  She does share that she is interested in continuing current measures; however, she would not want intubation or CPR/defib.  Patient does seem oriented to me; however, due to diagnosis of dementia and Dr. Jacob Moores conversation with daughter earlier in the day (daughter requesting full code status) - I am hesitant to  change code status without first speaking with daughter. I have attempted multiple times to call daughter. Awaiting call back. Voice mail left.  Questions and concerns were addressed with patient.  Primary Decision Maker PATIENT? Patient seems to understand situation and able to make her needs known however does have diagnosis of dementia and daughter is listed as [place]   - patient requests DNR status however earlier in the day daughter requested full and have been unable to follow up with daughter - I am not here tomorrow, will ask for team member to attempt follow up with daughter     Primary Diagnoses: Present on Admission: . Pneumonia due to COVID-19 virus . Acute respiratory failure due to COVID-19 ([place]) . Benign essential HTN . Chronic atrial fibrillation ([place]) . Chronic obstructive pulmonary disease (COPD) ([place]) . 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 Depression   I have reviewed the medical record, interviewed the patient and family, and examined the patient. The following aspects are pertinent.  Past Medical History:  Diagnosis Date  . Adenoma of rectum   . Adrenal adenoma   . Anemia   . Anemia, unspecified   . Arthritis   . Ascending aortic aneurysm ([place])    a. s/p repair 07/2014  . Atrophic vaginitis   . Benign neoplasm of colon, unspecified   . COPD (chronic obstructive pulmonary disease) ([place])   . Coronary artery disease, non-occlusive    a. LHC 06/2014 without evidence of obstructive disease  . Depression   . Diverticulosis   . Dysphagia   . Dysrhythmia   . Essential hypertension    benign  . Frequent  UTI   . GERD (gastroesophageal reflux disease)   . Hemiplegia and hemiparesis    following cerebral infarction affecting left non-dominant side  . Hypertension   . Microscopic hematuria   . Nonrheumatic aortic valve insufficiency   . Osteoporosis    unspecified  . Panic attacks   . Paroxysmal  atrial fibrillation (HCC)   . Pernicious anemia   . Renal cyst    CKD STAGE 3  . Stroke ([place]) 07/23/2014   a. post-operative setting in 07/2014 leading to left UE hemiapresis and left LE weakness  . Urge incontinence    Social History   Socioeconomic History  . Marital status: Widowed    Spouse name: Not on file  . Number of children: 3  . Years of education: 45  . Highest education level: Not on file  Occupational History  . Not on file  Tobacco Use  . Smoking status: Former Smoker    Packs/day: 0.25    Years: 50.00    Pack years: 12.50    Types: Cigarettes  . Smokeless tobacco: Never Used  . Tobacco comment: 4-5 cigarettes per day   Vaping Use  . Vaping Use: Never used  Substance and Sexual Activity  . Alcohol use: No    Comment: occasionally  . Drug use: No  . Sexual activity: Not on file  Other Topics Concern  . Not on file  Social History Narrative   Admitted to [place] 03/01/2016   Widowed   4 children   Former Smoker   Occasionally drinks   DNR   Social Determinants of Radio broadcast assistant Strain:   . Difficulty of Paying Living Expenses: Not on file  Food Insecurity:   . Worried About Charity fundraiser in the Last Year: Not on file  . Ran Out of Food in the Last Year: Not on file  Transportation Needs:   . Lack of Transportation (Medical): Not on file  . Lack of Transportation (Non-Medical): Not on file  Physical Activity:   . Days of Exercise per Week: Not on file  . Minutes of Exercise per Session: Not on file  Stress:   . Feeling of Stress : Not on file  Social Connections:   . Frequency of Communication with Friends and Family: Not on file  . Frequency of Social Gatherings with Friends and Family: Not on file  . Attends Religious Services: Not on file  . Active Member of Clubs or Organizations: Not on file  . Attends Archivist Meetings: Not on file  . Marital Status: Not on file   Family History  Problem  Relation Age of Onset  . Lung cancer Son   . CAD Father   . Heart attack Father   . CAD Brother   . Heart attack Brother    Scheduled Meds: . acetaminophen  650 mg Oral BID  . vitamin C  500 mg Oral Daily  . aspirin EC  81 mg Oral Daily  . baricitinib  2 mg Oral Daily  . diltiazem  60 mg Oral Daily  . docusate sodium  100 mg Oral BID  . enoxaparin (LOVENOX) injection  30 mg Subcutaneous Q24H  . fluticasone furoate-vilanterol  1 puff Inhalation Daily  . gabapentin  300 mg Oral Q12H  . lidocaine   Transdermal Daily  . methylPREDNISolone (SOLU-MEDROL) injection  30 mg Intravenous Q12H   Followed by  . [START ON 30-Jan-2020] predniSONE  50 mg Oral Daily  .  montelukast  10 mg Oral QHS  . pantoprazole  40 mg Oral Daily  . potassium chloride  10 mEq Oral Daily  . rOPINIRole  0.25 mg Oral QHS  . sertraline  150 mg Oral Daily  . sodium chloride flush  3 mL Intravenous Q12H  . torsemide  10 mg Oral Daily  . zinc sulfate  220 mg Oral Daily   Continuous Infusions: . sodium chloride 250 mL (01/19/20 1333)  . remdesivir 100 mg in NS 100 mL 100 mg (01/19/20 1338)   PRN Meds:.sodium chloride, acetaminophen, albuterol, alum & mag hydroxide-simeth, benzonatate, diclofenac sodium, guaiFENesin-dextromethorphan, methocarbamol, ondansetron **OR** ondansetron (ZOFRAN) IV, oxyCODONE, polyethylene glycol, polyvinyl alcohol, sodium chloride flush Allergies  Allergen Reactions  . Iodinated Diagnostic Agents Itching and Swelling    Other reaction(s): Unknown  . Nitrofurantoin Diarrhea    Other reaction(s): Unknown  . Omnipaque [Iohexol]   . Solifenacin Other (See Comments)    indigestion  . Ciprofloxacin Other (See Comments) and Rash    Colitis  . Metronidazole Itching and Rash  . Sulfa Antibiotics Itching and Rash   Review of Systems  Constitutional: Positive for activity change, appetite change and fatigue.  Respiratory: Positive for cough. Negative for shortness of breath.   Neurological:  Positive for weakness.    Physical Exam Constitutional:      General: She is not in acute distress. Pulmonary:     Effort: Pulmonary effort is normal.  Skin:    General: Skin is warm and dry.  Neurological:     Mental Status: She is alert and oriented to person, place, and time.     Vital Signs: BP (!) 152/62 (BP Location: Right Arm)   Pulse 71   Temp 98.2 F (36.8 C) (Oral)   Resp 13   Ht $R'5\' 2"'eY$  (1.575 m)   Wt 52.1 kg   SpO2 95%   BMI 21.01 kg/m  Pain Scale: 0-10   Pain Score: 0-No pain   SpO2: SpO2: 95 % O2 Device:SpO2: 95 % O2 Flow Rate: .O2 Flow Rate (L/min): 15 L/min  IO: Intake/output summary: No intake or output data in the 24 hours ending 01/19/20 1546  LBM: Last BM Date: 01/17/20 Baseline Weight: Weight: 54.4 kg Most recent weight: Weight: 52.1 kg     Palliative Assessment/Data: PPS 40%    Time Total: 50 minutes Greater than 50%  of this time was spent counseling and coordinating care related to the above assessment and plan.  Juel Burrow, DNP, AGNP-C Palliative Medicine Team [phone] Pager: [phone]

## 2020-01-19 NOTE — TOC Initial Note (Signed)
Transition of Care [facility]) - Initial/Assessment Note    Patient Details  Name: Ana Schultz MRN: [id number] Date of Birth: 1926/01/28  Transition of Care [facility]) CM/SW Contact:    Shelbie Hutching, RN Phone Number: 01/19/2020, 10:02 AM  Clinical Narrative:                 Patient admitted to the hospital with COVID 19 requiring HFNC 15L.  RNCM was able to speak with patient's daughter, [name], via phone.  Patient has been living at [facility] assisted living.  Patient's daughter reports that at 34 years old she has an aortic aneurism repaired and did well with the surgery by while recovering suffered a major stroke.  Patient has no uses of her left arm and limited movement of her left leg.  Patient is wheelchair bound at baseline but is able to get to the bathroom by herself.  Patient's daughter would be open to skilled nursing for rehab, 1st choice is [facility] and daughter reports that the patient has been on a waiting list for a long time.  Daughter definitely does not want [facility].  TOC team will cont to follow.    Expected Discharge Plan: Skilled Nursing Facility Barriers to Discharge: Continued Medical Work up   Patient Goals and CMS Choice Patient states their goals for this hospitalization and ongoing recovery are:: Patient's daughter would like for her to go to [facility]- has been on a waiting list CMS Medicare.gov Compare Post Acute Care list provided to:: Patient Represenative (must comment) Choice offered to / list presented to : [place] / Guardian  Expected Discharge Plan and Services Expected Discharge Plan: [place]   Discharge Planning Services: CM Consult Post Acute Care Choice: [place] Living arrangements for the past 2 months: Assisted Living Facility                           HH Arranged: NA          Prior Living Arrangements/Services Living arrangements for the past 2 months: [place] Lives with:: Facility Resident Patient language and need for interpreter reviewed:: Yes Do you feel safe going back to the place where you live?: Yes      Need for Family Participation in Patient Care: Yes (Comment) (COVID) Care giver support system in place?: Yes (comment) (daughter, grandson) Current home services: DME (wheelchair bound) Criminal Activity/Legal Involvement Pertinent to Current Situation/Hospitalization: No - Comment as needed  Activities of Daily Living Home Assistive Devices/Equipment: None ADL Screening (condition at time of admission) Patient's cognitive ability adequate to safely complete daily activities?: Yes Is the patient deaf or have difficulty hearing?: Yes Does the patient have difficulty seeing, even when wearing glasses/contacts?: No Does the patient have difficulty concentrating, remembering, or making decisions?: No Patient able to express need for assistance with ADLs?: Yes Does the patient have difficulty dressing or bathing?: Yes Independently performs ADLs?: No Communication: Independent Dressing (OT): Dependent Is this a change from baseline?: Pre-admission baseline Grooming: Dependent Is this a change from baseline?: Pre-admission baseline Feeding: Needs assistance Is this a change from baseline?: Pre-admission baseline Bathing: Needs assistance Is this a change from baseline?: Pre-admission baseline Toileting: Needs assistance Is this a change from baseline?: Pre-admission baseline In/Out Bed: Dependent Is this a change from baseline?: Pre-admission baseline Walks in Home: Needs assistance Is this a change from baseline?: Pre-admission baseline Does the patient have  difficulty walking or climbing stairs?: Yes Weakness of Legs: Left Weakness of Arms/Hands: Left  Permission Sought/Granted Permission sought to share information with : Case Manager, Family Supports, Chartered certified accountant granted to share  information with : Yes, Verbal Permission Granted  Share Information with NAME: [name]  Permission granted to share info w AGENCY: [place] granted to share info w Relationship: daughter     Emotional Assessment       Orientation: : Oriented to Self, Oriented to Place, Oriented to Situation Alcohol / Substance Use: Not Applicable Psych Involvement: No (comment)  Admission diagnosis:  Lymphopenia [D72.810] SOB (shortness of breath) [R06.02] Hypoxia [R09.02] Fever in other diseases [R50.81] Pneumonia due to COVID-19 virus [U07.1, J12.82] COVID-19 [U07.1] Patient Active Problem List   Diagnosis Date Noted  . Pneumonia due to COVID-19 virus 01/17/2020  . Acute respiratory failure due to COVID-19 ([place]) 01/23/2020  . Depression 01/11/2020  . Chronic atrial fibrillation ([place]) 12/12/2019  . Closed wedge compression fracture of L5 vertebra with routine healing 06/20/2018  . Constipation due to opioid therapy 06/13/2018  . Shingles outbreak 03/23/2018  . Chronic constipation 02/21/2018  . Chronic back pain 02/21/2018  . Chronic cerebrovascular accident (CVA) 02/21/2018  . Hypokalemia 02/21/2018  . Neuropathic pain 02/14/2018  . Chronic cough 07/23/2017  . Closed fracture of shaft of right radius 07/23/2017  . Hemiplegia and hemiparesis following cerebral infarction affecting left non-dominant side ([place]) 08/28/2016  . Hx of falling 08/28/2016  . Dysphagia following cerebral infarction 08/28/2016  . Dysphagia, oropharyngeal 08/28/2016  . Cognitive communication deficit 08/28/2016  . Hypertensive chronic kidney disease w stg 1-4/unsp chr kdny 08/28/2016  . Chronic kidney disease, stage III (moderate) 08/28/2016  . Vitamin B12 deficiency anemia due to intrinsic factor deficiency 08/28/2016  . Anxiety disorder 08/28/2016  . Insomnia 08/28/2016  . 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 Dry eye syndrome of both  lacrimal glands 08/28/2016  . Allergic rhinitis 08/28/2016  . Bell's palsy 08/28/2016  . Chronic pain 08/28/2016  . Hyperlipidemia 08/28/2016  . GERD (gastroesophageal reflux disease) 08/28/2016  . Closed compression fracture of L2 lumbar vertebra, sequela 04/03/2016  . Paroxysmal atrial fibrillation (HCC)   . Chronic obstructive pulmonary disease (COPD) ([place]) 11/09/2015  . Benign essential HTN 10/09/2014  . OP (osteoporosis) 10/09/2014  . Addison anemia 10/09/2014  . Tobacco abuse, in remission 08/02/2014  . Nonrheumatic aortic valve insufficiency 07/07/2014  . Aneurysm, ascending aorta (HCC) 07/07/2014   PCP:  Juluis Pitch, MD Pharmacy:   [facility] - [address] [place], [place] [address] Phone: [phone] Fax: [phone] [address]o [address], [place] [address] [place]  [address] Phone: [phone] Fax: [phone]     Social Determinants of Health (SDOH) Interventions    Readmission Risk Interventions Readmission Risk Prevention Plan 01/19/2020  Transportation Screening Complete  Medication Review (RN Care Manager) Referral to Pharmacy  PCP or Specialist appointment within 3-5 days of discharge Complete  [place]RI or [place] (No Data)  SW Recovery Care/Counseling Consult Complete  Palliative Care Screening Not Complete  Comments pending order for palliative care consult  [place] Not Complete  SNF Comments need to see patient progression for appropriate dispostion  Some recent data might be hidden

## 2020-01-19 NOTE — Progress Notes (Signed)
Patient daughter called and consented to the use of baricitinib.  We will start that.  She is full code at this point in time.  I was informed by the nurses that patient is supposed to be DNR per records.  I personally could not find the records.  I called daughter again and according to her daughter, she wants to keep her full code for now and will discuss with the family and let us know about final decision.

## 2020-01-19 NOTE — Progress Notes (Signed)
   01/19/20 1900  Clinical Encounter Type  Visited With Patient  Visit Type Initial;Spiritual support  Referral From Nurse  Consult/Referral To Chaplain  Spiritual Encounters  Spiritual Needs Prayer   This chaplain received a referral to support the patient from her nurse. Upon arrival, the patient was awake, alert, and oriented. She confirmed interest in seeing/talking with the chaplain and she was very welcoming to this Probation officer. The patient expressed that breathing has been difficult and that she desired prayer for her health and healing. She also expressed that she had/has felt like giving up. This chaplain offered supportive presence, active and reflective listening, encouragement, theological reflection, compassion, and prayer for each of the concerns that she shared. The patient would welcome continued support.  Gennaro Africa, Chaplain

## 2020-01-19 NOTE — Progress Notes (Signed)
Spoke with pts dtr deborah twice today on phone re update on pt.

## 2020-01-19 NOTE — Evaluation (Signed)
Clinical/Bedside Swallow Evaluation Patient Details  Name: Ana Schultz MRN: [id number] Date of Birth: [birth year]  Today's Date: 01/19/2020 Time: SLP Start Time (ACUTE ONLY): 0825 SLP Stop Time (ACUTE ONLY): 0850 SLP Time Calculation (min) (ACUTE ONLY): 25 min  Past Medical History:  Past Medical History:  Diagnosis Date  . Adenoma of rectum   . Adrenal adenoma   . Anemia   . Anemia, unspecified   . Arthritis   . Ascending aortic aneurysm ([place])    a. s/p repair 07/2014  . Atrophic vaginitis   . Benign neoplasm of colon, unspecified   . COPD (chronic obstructive pulmonary disease) ([place])   . Coronary artery disease, non-occlusive    a. LHC 06/2014 without evidence of obstructive disease  . Depression   . Diverticulosis   . Dysphagia   . Dysrhythmia   . Essential hypertension    benign  . Frequent UTI   . GERD (gastroesophageal reflux disease)   . Hemiplegia and hemiparesis    following cerebral infarction affecting left non-dominant side  . Hypertension   . Microscopic hematuria   . Nonrheumatic aortic valve insufficiency   . Osteoporosis    unspecified  . Panic attacks   . Paroxysmal atrial fibrillation (HCC)   . Pernicious anemia   . Renal cyst    CKD STAGE 3  . Stroke ([place]) 07/23/2014   a. post-operative setting in 07/2014 leading to left UE hemiapresis and left LE weakness  . Urge incontinence    Past Surgical History:  Past Surgical History:  Procedure Laterality Date  . ABDOMINAL HYSTERECTOMY  1972  . AORTOILIAC BYPASS    . ASCENDING AORTIC ANEURYSM REPAIR  07/23/2014   07/2014  . BLEPHAROPLASTY    . CATARACT EXTRACTION  2005  . GASTROSTOMY W/ FEEDING TUBE    . INTRAMEDULLARY (IM) NAIL INTERTROCHANTERIC Left 02/26/2016   Procedure: INTRAMEDULLARY (IM) NAIL INTERTROCHANTRIC;  Surgeon: Earnestine Leys, MD;  Location: [facility] ORS;  Service: Orthopedics;  Laterality: Left;  . KYPHOPLASTY N/A 08/26/2016   Procedure: KYPHOPLASTY T12;  Surgeon: Hessie Knows, MD;   Location: [facility] ORS;  Service: Orthopedics;  Laterality: N/A;  . KYPHOPLASTY N/A 06/17/2018   Procedure: L5 KYPHOPLASTY;  Surgeon: Hessie Knows, MD;  Location: [facility] ORS;  Service: Orthopedics;  Laterality: N/A;  . LEFT HEART CATH  06/2014  . PR ASCEND AORTIC GRAFT INCL VALVE SUSPENSION  07/07/2014   Procedure: ASCENDING AORTA GRAFT, WITH CARDIOPULMONARY BYPASS, WITH OR WITHOUT VALVE SUSPENSION; Surgeon: Dwaine Deter, MD; Location: MAIN OR [facility]; Service: Cardiothoracic  . PR LAP, GASTROTOMY W/O TUBE CONSTR  08/08/2014   Procedure: LAPAROSCOPY, SURGICAL; GASTOSTOMY W/O CONSTRUCTION OF GASTRIC TUBE (EG, STAMM PROCEDURE)(SEPARATE PROCED); Surgeon: Fredrik Rigger, MD; Location: MAIN OR [facility]; Service: Gastrointestinal  . PR PLACE PERCUT GASTROSTOMY TUBE  08/08/2014   Procedure: UGI ENDO; W/DIRECTED PLCMT PERQ GASTROSTOMY TUBE; Surgeon: Fredrik Rigger, MD; Location: MAIN OR [facility]; Service: Gastrointestinal   HPI:  Ana Schultz a 86 y.o.femalewith medical history significant forCVA with residual left-sided weakness, history of recurrent falls currently wheelchair dependent, history of chronic respiratory failure on 3 L of oxygen, history of COPD, hypertension and stage III chronic kidney disease who was brought into the ER by EMS after she was found hypoxic,patient was noted to have room air pulse oximetry in the 40s and was placed on a nonrebreather mask, 15Lwith improvement in her pulse oximetry to 98%.    Assessment / Plan / Recommendation Clinical Impression  This patient is known to me (  I was her primary SLP for an extended period of time after her CVA). Pt is alert, verbal and able to express her wants/needs. She recognized me and asked appropriate questions about my children etc demonstrating good recall. Upon entering the room, pt's O2 sats were 85% while on 15 liters HFNC. Pt's breakfast tray was in front of her and assistance was provided to peel banana and fix  pt's coffee. Pt consumed current breakfast grossly adequate oropharyngeal abilities and no change in her O2 sats. Pt's RR and HR remained stable. This Probation officer alerted pt's nurse to O2 sats. Given that you sats were not impacted by PO intake, would recommend continuing current diet. However, given pt's respiratory demands, she will continue to be at a very high risk of aspiration. ST intervention is not indicated at this time. Please re-consult if pt's condition should change.  SLP Visit Diagnosis: Dysphagia, unspecified (R13.10)    Aspiration Risk  Mild aspiration risk    Diet Recommendation Thin liquid;Regular   Liquid Administration via: Cup Medication Administration: Crushed with puree Supervision: Staff to assist with self feeding Compensations: Minimize environmental distractions;Slow rate;Small sips/bites Postural Changes: Seated upright at 90 degrees;Remain upright for at least 30 minutes after po intake    Other  Recommendations Oral Care Recommendations: Oral care BID   Follow up Recommendations Skilled Nursing facility      Frequency and Duration            Prognosis        Swallow Study   General Date of Onset: 01/21/2020 HPI: Ana Schultz a 86 y.o.femalewith medical history significant forCVA with residual left-sided weakness, history of recurrent falls currently wheelchair dependent, history of chronic respiratory failure on 3 L of oxygen, history of COPD, hypertension and stage III chronic kidney disease who was brought into the ER by EMS after she was found hypoxic,patient was noted to have room air pulse oximetry in the 40s and was placed on a nonrebreather mask, 15Lwith improvement in her pulse oximetry to 98%.  Type of Study: Bedside Swallow Evaluation Previous Swallow Assessment: several years ago post stroke Diet Prior to this Study: Regular;Thin liquids Temperature Spikes Noted: No Respiratory Status:  (HFNC 15 liters) History of Recent Intubation:  No Behavior/Cognition: Alert;Cooperative;Pleasant mood Oral Cavity Assessment: Within Functional Limits Oral Care Completed by SLP: No Oral Cavity - Dentition: Dentures, top;Dentures, bottom Vision: Functional for self-feeding Self-Feeding Abilities: Needs assist Patient Positioning: Upright in bed Baseline Vocal Quality: Normal Volitional Cough: Strong Volitional Swallow: Able to elicit    Oral/Motor/Sensory Function Overall Oral Motor/Sensory Function: Within functional limits   Ice Chips Ice chips: Not tested   Thin Liquid Thin Liquid: Within functional limits Presentation: Cup;Self Fed    Nectar Thick Nectar Thick Liquid: Not tested   Honey Thick Honey Thick Liquid: Not tested   Puree Puree: Within functional limits Presentation: Self Fed;Spoon   Solid     Solid: Within functional limits Presentation: Self Fed     Jaimen Melone B. Rutherford Nail M.S., CCC-SLP, [place] Office [phone]  Ana Schultz Rutherford Nail 01/19/2020,1:02 PM

## 2020-01-19 NOTE — NC FL2 (Signed)
[place] LEVEL OF CARE SCREENING TOOL     IDENTIFICATION  Patient Name: Ana Schultz Birthdate: 06/22/1925 Sex: female Admission Date (Current Location): 01/23/2020  [place] and [place] Number:  Engineering geologist and Address:  [facility], [address] [place], [place] [address]      Provider Number: [id number]  Attending Physician Name and Address:  Darliss Cheney, MD  Relative Name and Phone Number:  Cam Hai -daughter [phone]    Current Level of Care: Hospital Recommended Level of Care: [place] Prior Approval Number:    Date Approved/Denied:   PASRR Number: [id number]  Discharge Plan: SNF    Current Diagnoses: Patient Active Problem List   Diagnosis Date Noted  . Pneumonia due to COVID-19 virus 01/06/2020  . Acute respiratory failure due to COVID-19 ([place]) 01/05/2020  . Depression 01/20/2020  . Chronic atrial fibrillation ([place]) 12/12/2019  . Closed wedge compression fracture of L5 vertebra with routine healing 06/20/2018  . Constipation due to opioid therapy 06/13/2018  . Shingles outbreak 03/23/2018  . Chronic constipation 02/21/2018  . Chronic back pain 02/21/2018  . Chronic cerebrovascular accident (CVA) 02/21/2018  . Hypokalemia 02/21/2018  . Neuropathic pain 02/14/2018  . Chronic cough 07/23/2017  . Closed fracture of shaft of right radius 07/23/2017  . Hemiplegia and hemiparesis following cerebral infarction affecting left non-dominant side ([place]) 08/28/2016  . Hx of falling 08/28/2016  . Dysphagia following cerebral infarction 08/28/2016  . Dysphagia, oropharyngeal 08/28/2016  . Cognitive communication deficit 08/28/2016  . Hypertensive chronic kidney disease w stg 1-4/unsp chr kdny 08/28/2016  . Chronic kidney disease, stage III (moderate) 08/28/2016  . Vitamin B12 deficiency anemia due to intrinsic factor deficiency 08/28/2016  . Anxiety disorder 08/28/2016  . Insomnia  08/28/2016  . 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 Dry eye syndrome of both lacrimal glands 08/28/2016  . Allergic rhinitis 08/28/2016  . Bell's palsy 08/28/2016  . Chronic pain 08/28/2016  . Hyperlipidemia 08/28/2016  . GERD (gastroesophageal reflux disease) 08/28/2016  . Closed compression fracture of L2 lumbar vertebra, sequela 04/03/2016  . Paroxysmal atrial fibrillation (HCC)   . Chronic obstructive pulmonary disease (COPD) ([place]) 11/09/2015  . Benign essential HTN 10/09/2014  . OP (osteoporosis) 10/09/2014  . Addison anemia 10/09/2014  . Tobacco abuse, in remission 08/02/2014  . Nonrheumatic aortic valve insufficiency 07/07/2014  . Aneurysm, ascending aorta (HCC) 07/07/2014    Orientation RESPIRATION BLADDER Height & Weight     Self, Situation, Place  O2 Continent Weight: 52.1 kg Height:  5\' 2"  (157.5 cm)  BEHAVIORAL SYMPTOMS/MOOD NEUROLOGICAL BOWEL NUTRITION STATUS      Continent Diet (2 Gram Na thin liquids)  AMBULATORY STATUS COMMUNICATION OF NEEDS Skin   Extensive Assist Verbally Bruising, Skin abrasions                       Personal Care Assistance Level of Assistance  Bathing, Feeding, Dressing Bathing Assistance: Limited assistance Feeding assistance: Limited assistance Dressing Assistance: Limited assistance     Functional Limitations Info             SPECIAL CARE FACTORS FREQUENCY  PT (By licensed PT), OT (By licensed OT)     PT Frequency: 5 times per week OT Frequency: 5 times per week            Contractures Contractures Info: Not present    Additional Factors Info  Code Status, Allergies Code Status  Info: DNR Allergies Info: Iodinated Diagnostic agents, nitrofurantoin, iohexol, solifenacin, cipro, flagyl, sulfa drugs           Current Medications (01/19/2020):  This is the current hospital active medication list Current Facility-Administered Medications  Medication Dose Route Frequency  Provider Last Rate Last Admin  . 0.9 %  sodium chloride infusion  250 mL Intravenous PRN Agbata, Tochukwu, MD      . acetaminophen (TYLENOL) tablet 650 mg  650 mg Oral BID Agbata, Tochukwu, MD   650 mg at 01/04/2020 2253  . acetaminophen (TYLENOL) tablet 650 mg  650 mg Oral Q6H PRN Agbata, Tochukwu, MD      . albuterol (VENTOLIN HFA) 108 (90 Base) MCG/ACT inhaler 2 puff  2 puff Inhalation Q4H PRN Agbata, Tochukwu, MD      . alum & mag hydroxide-simeth (MAALOX/MYLANTA) 200-200-20 MG/5ML suspension 30 mL  30 mL Oral Q4H PRN Agbata, Tochukwu, MD      . ascorbic acid (VITAMIN C) tablet 500 mg  500 mg Oral Daily Agbata, Tochukwu, MD      . aspirin EC tablet 81 mg  81 mg Oral Daily Agbata, Tochukwu, MD      . benzonatate (TESSALON) capsule 100 mg  100 mg Oral TID PRN Agbata, Tochukwu, MD      . diclofenac sodium (VOLTAREN) 1 % transdermal gel 4 g  4 g Topical TID PRN Agbata, Tochukwu, MD      . diltiazem (CARDIZEM) tablet 60 mg  60 mg Oral Daily Agbata, Tochukwu, MD      . docusate sodium (COLACE) capsule 100 mg  100 mg Oral BID Agbata, Tochukwu, MD   100 mg at 01/28/2020 2253  . enoxaparin (LOVENOX) injection 30 mg  30 mg Subcutaneous Q24H Agbata, Tochukwu, MD   30 mg at 01/07/2020 2252  . fluticasone furoate-vilanterol (BREO ELLIPTA) 100-25 MCG/INH 1 puff  1 puff Inhalation Daily Agbata, Tochukwu, MD      . gabapentin (NEURONTIN) capsule 300 mg  300 mg Oral Q12H Agbata, Tochukwu, MD   300 mg at 02/01/2020 2253  . guaiFENesin-dextromethorphan (ROBITUSSIN DM) 100-10 MG/5ML syrup 10 mL  10 mL Oral Q4H PRN Agbata, Tochukwu, MD      . lidocaine (LIDODERM) 5 %   Transdermal Daily Agbata, Tochukwu, MD      . methocarbamol (ROBAXIN) tablet 500 mg  500 mg Oral Q8H PRN Agbata, Tochukwu, MD      . methylPREDNISolone sodium succinate (SOLU-MEDROL) 40 mg/mL injection 30 mg  30 mg Intravenous Q12H Agbata, Tochukwu, MD   30 mg at 01/05/2020 2253   Followed by  . [START ON 02-21-2020] predniSONE (DELTASONE) tablet 50 mg  50 mg  Oral Daily Agbata, Tochukwu, MD      . montelukast (SINGULAIR) tablet 10 mg  10 mg Oral QHS Agbata, Tochukwu, MD   10 mg at 01/12/2020 2253  . ondansetron (ZOFRAN) tablet 4 mg  4 mg Oral Q6H PRN Agbata, Tochukwu, MD       Or  . ondansetron (ZOFRAN) injection 4 mg  4 mg Intravenous Q6H PRN Agbata, Tochukwu, MD      . oxyCODONE (Oxy IR/ROXICODONE) immediate release tablet 5 mg  5 mg Oral Q12H PRN Agbata, Tochukwu, MD      . pantoprazole (PROTONIX) EC tablet 40 mg  40 mg Oral Daily Agbata, Tochukwu, MD      . polyethylene glycol (MIRALAX / GLYCOLAX) packet 17 g  17 g Oral BID PRN Collier Bullock, MD      .  polyvinyl alcohol (LIQUIFILM TEARS) 1.4 % ophthalmic solution 2 drop  2 drop Both Eyes TID PRN Agbata, Tochukwu, MD      . potassium chloride SA (KLOR-CON) CR tablet 10 mEq  10 mEq Oral Daily Agbata, Tochukwu, MD      . remdesivir 100 mg in sodium chloride 0.9 % 100 mL IVPB  100 mg Intravenous Daily Vladimir Crofts, MD      . sertraline (ZOLOFT) tablet 100 mg  100 mg Oral Daily Agbata, Tochukwu, MD      . sodium chloride flush (NS) 0.9 % injection 3 mL  3 mL Intravenous Q12H Agbata, Tochukwu, MD   3 mL at 01/12/2020 2254  . sodium chloride flush (NS) 0.9 % injection 3 mL  3 mL Intravenous PRN Agbata, Tochukwu, MD      . torsemide (DEMADEX) tablet 10 mg  10 mg Oral Daily Agbata, Tochukwu, MD      . zinc sulfate capsule 220 mg  220 mg Oral Daily Agbata, Tochukwu, MD         Discharge Medications: Please see discharge summary for a list of discharge medications.  Relevant Imaging Results:  Relevant Lab Results:   Additional Information SS# [id number]  Shelbie Hutching, RN

## 2020-01-20 DIAGNOSIS — Z515 Encounter for palliative care: Secondary | ICD-10-CM | POA: Diagnosis not present

## 2020-01-20 LAB — COMPREHENSIVE METABOLIC PANEL
ALT: 15 U/L (ref 0–44)
AST: 30 U/L (ref 15–41)
Albumin: 3.3 g/dL — ABNORMAL LOW (ref 3.5–5.0)
Alkaline Phosphatase: 66 U/L (ref 38–126)
Anion gap: 10 (ref 5–15)
BUN: 53 mg/dL — ABNORMAL HIGH (ref 8–23)
CO2: 25 mmol/L (ref 22–32)
Calcium: 8.3 mg/dL — ABNORMAL LOW (ref 8.9–10.3)
Chloride: 110 mmol/L (ref 98–111)
Creatinine, Ser: 1.43 mg/dL — ABNORMAL HIGH (ref 0.44–1.00)
GFR calc Af Amer: 37 mL/min — ABNORMAL LOW (ref >60)
GFR calc non Af Amer: 32 mL/min — ABNORMAL LOW (ref >60)
Glucose, Bld: 129 mg/dL — ABNORMAL HIGH (ref 70–99)
Potassium: 5 mmol/L (ref 3.5–5.1)
Sodium: 145 mmol/L (ref 135–145)
Total Bilirubin: 0.8 mg/dL (ref 0.3–1.2)
Total Protein: 5.9 g/dL — ABNORMAL LOW (ref 6.5–8.1)

## 2020-01-20 LAB — CBC WITH DIFFERENTIAL/PLATELET
Abs Immature Granulocytes: 0.11 10*3/uL — ABNORMAL HIGH (ref 0.00–0.07)
Basophils Absolute: 0 10*3/uL (ref 0.0–0.1)
Basophils Relative: 0 %
Eosinophils Absolute: 0 10*3/uL (ref 0.0–0.5)
Eosinophils Relative: 0 %
HCT: 29.2 % — ABNORMAL LOW (ref 36.0–46.0)
Hemoglobin: 9.7 g/dL — ABNORMAL LOW (ref 12.0–15.0)
Immature Granulocytes: 3 %
Lymphocytes Relative: 18 %
Lymphs Abs: 0.8 10*3/uL (ref 0.7–4.0)
MCH: 28.7 pg (ref 26.0–34.0)
MCHC: 33.2 g/dL (ref 30.0–36.0)
MCV: 86.4 fL (ref 80.0–100.0)
Monocytes Absolute: 0.2 10*3/uL (ref 0.1–1.0)
Monocytes Relative: 4 %
Neutro Abs: 3.2 10*3/uL (ref 1.7–7.7)
Neutrophils Relative %: 75 %
Platelets: 239 10*3/uL (ref 150–400)
RBC: 3.38 MIL/uL — ABNORMAL LOW (ref 3.87–5.11)
RDW: 14.4 % (ref 11.5–15.5)
Smear Review: NORMAL
WBC: 4.3 10*3/uL (ref 4.0–10.5)
nRBC: 0 % (ref 0.0–0.2)

## 2020-01-20 LAB — FERRITIN: Ferritin: 279 ng/mL (ref 11–307)

## 2020-01-20 LAB — MAGNESIUM: Magnesium: 2.6 mg/dL — ABNORMAL HIGH (ref 1.7–2.4)

## 2020-01-20 LAB — C-REACTIVE PROTEIN: CRP: 11.7 mg/dL — ABNORMAL HIGH (ref <1.0)

## 2020-01-20 LAB — PHOSPHORUS: Phosphorus: 4.8 mg/dL — ABNORMAL HIGH (ref 2.5–4.6)

## 2020-01-20 LAB — FIBRIN DERIVATIVES D-DIMER (ARMC ONLY): Fibrin derivatives D-dimer (ARMC): 4007.85 ng/mL (FEU) — ABNORMAL HIGH (ref 0.00–499.00)

## 2020-01-20 NOTE — Progress Notes (Signed)
PROGRESS NOTE    Ana Schultz  JHE:[id number] DOB: Sep 28, 1925 DOA: 01/20/2020 PCP: Juluis Pitch, MD   Brief Narrative:  HPI: Ana Schultz is a 86 y.o. female with medical history significant for CVA with residual left-sided weakness, history of recurrent falls currently wheelchair dependent, history of chronic respiratory failure on 3 L of oxygen, history of COPD, hypertension and stage III chronic kidney disease who was brought into the ER by EMS after she was found hypoxic, patient was noted to have room air pulse oximetry in the 40s and was placed on a nonrebreather mask, 15L with improvement in her pulse oximetry to 98% Patient tested positive for Covid within the last week and is fully vaccinated with mRNA vaccine from January/February 2021. Upon arrival to the ER patient had a fever with a T-max of 101F and was tachypneic with respiratory rate and was tachypneic with respiratory rate of 32. Labs show sodium of 138, potassium 4.7, chloride 96, bicarb 24, BUN 37, creatinine 1.4, calcium 8.1, albumin 3.6, AST 29, ALT 13, BNP 531, troponin 54, lactic acid 1.2, white count 2.9, hemoglobin 9.6, hematocrit 29.3, MCV 86.4, RDW 13.6, platelet count 186 Chest x-ray reviewed by me shows extensive bilateral airspace opacities consistent with Covid pneumonia Twelve-lead EKG reviewed by me shows sinus rhythm with PACs.  ED Course: Patient is a 86 year old female who resides in a skilled nursing facility and was sent to the ER for evaluation of shortness of breath and hypoxia.  Patient was noted to have room air pulse oximetry of 40% and was placed on a nonrebreather mask, 15L with improvement in her pulse oximetry to 98%. Chest x-ray is consistent with Covid pneumonia Patient received 2 doses of the COVID-19 vaccine in January.  She will be admitted to the hospital for further evaluation.  Assessment & Plan:   Principal Problem:   Pneumonia due to COVID-19 virus Active Problems:    Benign essential HTN   Chronic obstructive pulmonary disease (COPD) (HCC)   Hemiplegia and hemiparesis following cerebral infarction affecting left non-dominant side (HCC)   Major depressive disorder, single episode in full remission ([place])   Chronic atrial fibrillation (HCC)   Acute respiratory failure due to COVID-19 [facility])   Depression   Goals of care, counseling/discussion   Palliative care by specialist   Acute on chronic hypoxic respiratory failure and severe sepsis due to COVID-19 pneumonia virus: Patient meets sepsis greater based on tachypnea, fever and hypoxia. Patient resides in a skilled nursing facility and received the COVID-19 vaccine in Jan/Feb She tested positive for the COVID-19 virus about a week ago and then here in the hospital. Patient had room air pulse oximetry of 40% at the scene and this improved following oxygen supplementation with a nonrebreather mask at 15 L.  She uses 2 L of nasal oxygen at baseline due to her COPD.  She still remains on 15 L high flow oxygen.  She does not seem to have COPD exacerbation at this point in time.  Patient's inflammatory markers are improving.  Patient remains alert and oriented. Continue following: Remdesivir per pharmacy protocol, IV Solu-Medrol Baricitinib-patient's daughter consented to that. Supportive treatment, antitussive. Zinc and vitamin C. Patient was encouraged to prone, out of bed to chair, to use incentive spirometry and flutter valve.  History of paroxysmal A. Fib: Continue Cardizem for rate control Patient not on long-term anticoagulation therapy due to history of recurrent falls  History of CVA with left-sided hemiparesis Patient requires assistance with activities of daily  living Continue aspirin  Depression Continue sertraline  CKD stage IIIb: At baseline.  Monitor.  DVT prophylaxis: enoxaparin (LOVENOX) injection 30 mg Start: 01/17/2020 2200   Code Status: Full Code  Family Communication: None  present at bedside.  I called patient's grandson who happens to be her healthcare power of attorney.  He was upset that they are not allowed to visit the patient.  He was upset at many other things as well.  We discussed CODE STATUS in length.  Patient currently is full code.  Patient has verbalized to palliative care and to me today that she wants to be DNR.  She seems to be alert and oriented and competent.  I did explain this to the grandson but he claims that patient has dementia and cannot make any decisions.  He wants her to be full code at this point in time and would make further decisions after talking to family and if and when she needs intubation.  I clearly explained to him that patient remains full code on our chart/records and that if patient were to need urgent intubation, she will be intubated based on the records and our discussion and that there may not be enough time for medical staff to call him and get another permission.  He verbalized understanding.  I repeated the same thing twice with him today.  He also was demanding that he should be called on every little change in his grandmothers health.  I did explain to him that he will be called if there will be significant change otherwise he will be called once daily.  However, he is welcome to call nursing staff and get the update whenever he wishes.  Palliative care on board.  We will let palliative care discuss further CODE STATUS with family members.  Especially patient's grandson who happens to be healthcare power of attorney.  Status is: Inpatient  Remains inpatient appropriate because:Inpatient level of care appropriate due to severity of illness   Dispo: The patient is from: SNF              Anticipated d/c is to: SNF              Anticipated d/c date is: > 3 days              Patient currently is not medically stable to d/c.        Estimated body mass index is 21.01 kg/m as calculated from the following:   Height as of  this encounter: 5\' 2"  (1.575 m).   Weight as of this encounter: 52.1 kg.      Nutritional status:               Consultants:   Palliative care  Procedures:   None  Antimicrobials:  Anti-infectives (From admission, onward)   Start     Dose/Rate Route Frequency Ordered Stop   01/19/20 1000  remdesivir 100 mg in sodium chloride 0.9 % 100 mL IVPB       "Followed by" Linked Group Details   100 mg 200 mL/hr over 30 Minutes Intravenous Daily 01/30/2020 0958 01/23/20 0959   01/19/20 1000  remdesivir 100 mg in sodium chloride 0.9 % 100 mL IVPB  Status:  Discontinued       "Followed by" Linked Group Details   100 mg 200 mL/hr over 30 Minutes Intravenous Daily 01/05/2020 1144 01/24/2020 1148   01/08/2020 1145  remdesivir 200 mg in sodium chloride 0.9% 250 mL IVPB  Status:  Discontinued       "Followed by" Linked Group Details   200 mg 580 mL/hr over 30 Minutes Intravenous Once 01/11/2020 1144 01/17/2020 1148   01/27/2020 1030  remdesivir 200 mg in sodium chloride 0.9% 250 mL IVPB       "Followed by" Linked Group Details   200 mg 580 mL/hr over 30 Minutes Intravenous Once 01/23/2020 0958 01/09/2020 1248         Subjective: Seen and examined.  Despite of being on nonrebreather, patient was able to tell me that she was feeling okay and did not have further or worsening of shortness of breath.  Objective: Vitals:   01/20/20 0454 01/20/20 0806 01/20/20 1210 01/20/20 1212  BP: 140/64 (!) 147/45 (!) 142/47 (!) 142/47  Pulse: 62 66 69 70  Resp: 20 20 20 20   Temp: 98 F (36.7 C)  98 F (36.7 C) 98 F (36.7 C)  TempSrc: Oral  Oral Oral  SpO2: 92% (!) 84% 92% 93%  Weight:      Height:        Intake/Output Summary (Last 24 hours) at 01/20/2020 1246 Last data filed at 01/19/2020 2100 Gross per 24 hour  Intake 129.32 ml  Output --  Net 129.32 ml   Filed Weights   01/17/2020 0935 01/19/20 0410  Weight: 54.4 kg 52.1 kg    Examination:  General exam: Appears calm and comfortable   Respiratory system: Clear to auscultation with diminished breath sounds. Respiratory effort normal. Cardiovascular system: S1 & S2 heard, RRR. No JVD, murmurs, rubs, gallops or clicks. No pedal edema. Gastrointestinal system: Abdomen is nondistended, soft and nontender. No organomegaly or masses felt. Normal bowel sounds heard. Central nervous system: Alert and oriented. No focal neurological deficits. Extremities: Symmetric 5 x 5 power. Skin: No rashes, lesions or ulcers.  Psychiatry: Judgement and insight appear normal. Mood & affect appropriate.    Data Reviewed: I have personally reviewed following labs and imaging studies  CBC: Recent Labs  Lab 01/09/2020 0937 01/19/20 0518 01/20/20 0452  WBC 2.9* 2.4* 4.3  NEUTROABS 2.2 1.8 3.2  HGB 9.6* 9.4* 9.7*  HCT 29.2* 29.5* 29.2*  MCV 86.4 88.9 86.4  PLT 186 200 166   Basic Metabolic Panel: Recent Labs  Lab 01/17/2020 0937 01/19/20 0518 01/20/20 0452  NA 138 142 145  K 4.2 5.1 5.0  CL 102 106 110  CO2 24 23 25   GLUCOSE 113* 108* 129*  BUN 37* 41* 53*  CREATININE 1.41* 1.22* 1.43*  CALCIUM 8.1* 8.4* 8.3*  MG  --  2.3 2.6*  PHOS  --  3.8 4.8*   GFR: Estimated Creatinine Clearance: 19.9 mL/min (A) (by C-G formula based on SCr of 1.43 mg/dL (H)). Liver Function Tests: Recent Labs  Lab 01/09/2020 0937 01/19/20 0518 01/20/20 0452  AST 29 32 30  ALT 13 14 15   ALKPHOS 65 64 66  BILITOT 0.8 0.9 0.8  PROT 6.4* 6.0* 5.9*  ALBUMIN 3.6 3.4* 3.3*   No results for input(s): LIPASE, AMYLASE in the last 168 hours. No results for input(s): AMMONIA in the last 168 hours. Coagulation Profile: No results for input(s): INR, PROTIME in the last 168 hours. Cardiac Enzymes: No results for input(s): CKTOTAL, CKMB, CKMBINDEX, TROPONINI in the last 168 hours. BNP (last 3 results) No results for input(s): PROBNP in the last 8760 hours. HbA1C: No results for input(s): HGBA1C in the last 72 hours. CBG: No results for input(s): GLUCAP in  the last 168 hours. Lipid Profile: No  results for input(s): CHOL, HDL, LDLCALC, TRIG, CHOLHDL, LDLDIRECT in the last 72 hours. Thyroid Function Tests: No results for input(s): TSH, T4TOTAL, FREET4, T3FREE, THYROIDAB in the last 72 hours. Anemia Panel: Recent Labs    01/19/20 0518 01/20/20 0452  FERRITIN 269 279   Sepsis Labs: Recent Labs  Lab 01/14/2020 9892  LATICACIDVEN 1.2    Recent Results (from the past 240 hour(s))  SARS Coronavirus 2 by RT PCR (hospital order, performed in [facility] hospital lab) Nasopharyngeal Nasopharyngeal Swab     Status: Abnormal   Collection Time: 01/21/2020  9:56 AM   Specimen: Nasopharyngeal Swab  Result Value Ref Range Status   SARS Coronavirus 2 POSITIVE (A) NEGATIVE Final    Comment: RESULT CALLED TO, READ BACK BY AND VERIFIED WITH:  Verlene Mayer [id number] 01/08/2020 SDR (NOTE) SARS-CoV-2 target nucleic acids are DETECTED  SARS-CoV-2 RNA is generally detectable in upper respiratory specimens  during the acute phase of infection.  Positive results are indicative  of the presence of the identified virus, but do not rule out bacterial infection or co-infection with other pathogens not detected by the test.  Clinical correlation with patient history and  other diagnostic information is necessary to determine patient infection status.  The expected result is negative.  Fact Sheet for Patients:   StrictlyIdeas.no   Fact Sheet for Healthcare Providers:   BankingDealers.co.za    This test is not yet approved or cleared by the [place] FDA and  has been authorized for detection and/or diagnosis of SARS-CoV-2 by FDA under an Emergency Use Authorization (EUA).  This EUA will remain in effect (meaning this test  can be used) for the duration of  the COVID-19 declaration under Section 564(b)(1) of the Act, 21 U.S.C. section 360-bbb-3(b)(1), unless the authorization is terminated or revoked  sooner.  Performed at [facility], [address] [place], [place] [address]       Radiology Studies: US Venous Img Lower Bilateral (DVT)  Result Date: 01/19/2020 CLINICAL DATA:  Bilateral lower extremity edema. Former smoker. Evaluate for DVT. EXAM: BILATERAL LOWER EXTREMITY VENOUS DOPPLER ULTRASOUND TECHNIQUE: Gray-scale sonography with graded compression, as well as color Doppler and duplex ultrasound were performed to evaluate the lower extremity deep venous systems from the level of the common femoral vein and including the common femoral, femoral, profunda femoral, popliteal and calf veins including the posterior tibial, peroneal and gastrocnemius veins when visible. The superficial great saphenous vein was also interrogated. Spectral Doppler was utilized to evaluate flow at rest and with distal augmentation maneuvers in the common femoral, femoral and popliteal veins. COMPARISON:  None. FINDINGS: RIGHT LOWER EXTREMITY Common Femoral Vein: No evidence of thrombus. Normal compressibility, respiratory phasicity and response to augmentation. Saphenofemoral Junction: No evidence of thrombus. Normal compressibility and flow on color Doppler imaging. Profunda Femoral Vein: No evidence of thrombus. Normal compressibility and flow on color Doppler imaging. Femoral Vein: No evidence of thrombus. Normal compressibility, respiratory phasicity and response to augmentation. Popliteal Vein: No evidence of thrombus. Normal compressibility, respiratory phasicity and response to augmentation. Calf Veins: No evidence of thrombus. Normal compressibility and flow on color Doppler imaging. Superficial Great Saphenous Vein: No evidence of thrombus. Normal compressibility. Venous Reflux:  None. Other Findings:  None. LEFT LOWER EXTREMITY Common Femoral Vein: No evidence of thrombus. Normal compressibility, respiratory phasicity and response to augmentation. Saphenofemoral Junction: No evidence of thrombus.  Normal compressibility and flow on color Doppler imaging. Profunda Femoral Vein: No evidence of thrombus. Normal compressibility and  flow on color Doppler imaging. Femoral Vein: No evidence of thrombus. Normal compressibility, respiratory phasicity and response to augmentation. Popliteal Vein: No evidence of thrombus. Normal compressibility, respiratory phasicity and response to augmentation. Calf Veins: No evidence of thrombus. Normal compressibility and flow on color Doppler imaging. Superficial Great Saphenous Vein: No evidence of thrombus. Normal compressibility. Venous Reflux:  None. Other Findings:  None. IMPRESSION: No evidence of DVT within either lower extremity. Electronically Signed   By: Sandi Mariscal M.D.   On: 01/19/2020 15:35    Scheduled Meds: . acetaminophen  650 mg Oral BID  . vitamin C  500 mg Oral Daily  . aspirin EC  81 mg Oral Daily  . baricitinib  2 mg Oral Daily  . diltiazem  60 mg Oral Daily  . docusate  100 mg Oral BID  . enoxaparin (LOVENOX) injection  30 mg Subcutaneous Q24H  . fluticasone furoate-vilanterol  1 puff Inhalation Daily  . gabapentin  300 mg Oral Q12H  . lidocaine   Transdermal Daily  . methylPREDNISolone (SOLU-MEDROL) injection  30 mg Intravenous Q12H   Followed by  . [START ON Feb 21, 2020] predniSONE  50 mg Oral Daily  . montelukast  10 mg Oral QHS  . pantoprazole  40 mg Oral Daily  . potassium chloride  10 mEq Oral Daily  . rOPINIRole  0.25 mg Oral QHS  . sertraline  150 mg Oral Daily  . sodium chloride flush  3 mL Intravenous Q12H  . torsemide  10 mg Oral Daily  . zinc sulfate  220 mg Oral Daily   Continuous Infusions: . sodium chloride Stopped (01/19/20 1439)  . remdesivir 100 mg in NS 100 mL 100 mg (01/20/20 1100)     LOS: 2 days   Time spent: 40 minutes, this includes 10 minutes phone call with grandson.   Darliss Cheney, MD [facility]  01/20/2020, 12:46 PM   To contact the attending provider between 7A-7P or the covering  provider during after hours 7P-7A, please log into the web site [email or web address].

## 2020-01-20 NOTE — Progress Notes (Addendum)
Daily Progress Note   Patient Name: Ana Schultz       Date: 01/20/2020 DOB: 29-Feb-1926  Age: 86 y.o. MRN#: [id number] Attending Physician: Darliss Cheney, MD Primary Care Physician: Juluis Pitch, MD Admit Date: 01/19/2020  Reason for Consultation/Follow-up: Establishing goals of care  Subjective: Patient is on covid isolation and does not answer the phone.   -- Per notes question of code status and [place]; DNR in place from 2017 in [place]. -- Question of patient's decision making ability; per providers and staff, patient seems to have appropriate conversations, however per chart, family states she has dementia and cannot make decisions herself.   -- Question of who the surrogate decision makers are, and if they are equal in decision making, or if there is succession in [place].  Recommend psychiatry consult to determine decision making capacity to see if patient can make the decisions on [place] and code status herself. Recommend request for family to bring in [place] papers to determine who the surrogate decision maker is, if it is determined one is needed.   Length of Stay: 2  Current Medications: Scheduled Meds:  . acetaminophen  650 mg Oral BID  . vitamin C  500 mg Oral Daily  . aspirin EC  81 mg Oral Daily  . baricitinib  2 mg Oral Daily  . diltiazem  60 mg Oral Daily  . docusate  100 mg Oral BID  . enoxaparin (LOVENOX) injection  30 mg Subcutaneous Q24H  . fluticasone furoate-vilanterol  1 puff Inhalation Daily  . gabapentin  300 mg Oral Q12H  . lidocaine   Transdermal Daily  . methylPREDNISolone (SOLU-MEDROL) injection  30 mg Intravenous Q12H   Followed by  . [START ON January 28, 2020] predniSONE  50 mg Oral Daily  . montelukast  10 mg Oral QHS  . pantoprazole  40 mg Oral Daily  .  rOPINIRole  0.25 mg Oral QHS  . sertraline  150 mg Oral Daily  . sodium chloride flush  3 mL Intravenous Q12H  . zinc sulfate  220 mg Oral Daily    Continuous Infusions: . sodium chloride Stopped (01/19/20 1439)  . remdesivir 100 mg in NS 100 mL 100 mg (01/20/20 1100)    PRN Meds: sodium chloride, acetaminophen, albuterol, alum & mag hydroxide-simeth, benzonatate, diclofenac sodium, guaiFENesin-dextromethorphan, methocarbamol, ondansetron **OR** ondansetron (  ZOFRAN) IV, oxyCODONE, polyethylene glycol, polyvinyl alcohol, sodium chloride flush        Vital Signs: BP (!) 142/47 (BP Location: Right Arm)   Pulse 70   Temp 98 F (36.7 C) (Oral)   Resp 20   Ht 5\' 2"  (1.575 m)   Wt 52.1 kg   SpO2 93%   BMI 21.01 kg/m  SpO2: SpO2: 93 % O2 Device: O2 Device: High Flow Nasal Cannula O2 Flow Rate: O2 Flow Rate (L/min): 15 L/min  Intake/output summary:   Intake/Output Summary (Last 24 hours) at 01/20/2020 1418 Last data filed at 01/19/2020 2100 Gross per 24 hour  Intake 129.32 ml  Output --  Net 129.32 ml   LBM: Last BM Date: 01/17/20 Baseline Weight: Weight: 54.4 kg Most recent weight: Weight: 52.1 kg       Palliative Assessment/Data:    Flowsheet Rows     Most Recent Value  Intake Tab  Referral Department Hospitalist  Unit at Time of Referral Med/Surg Unit  Palliative Care Primary Diagnosis Sepsis/Infectious Disease  Date Notified 01/19/20  Palliative Care Type New Palliative care  Reason for referral Clarify Goals of Care  Date of Admission 01/27/2020  Date first seen by Palliative Care 01/19/20  # of days Palliative referral response time 0 Day(s)  # of days IP prior to Palliative referral 1  Clinical Assessment  Palliative Performance Scale Score 40%  Psychosocial & Spiritual Assessment  Palliative Care Outcomes  Patient/Family meeting held? Yes  Who was at the meeting? patient      Patient Active Problem List   Diagnosis Date Noted  . Goals of care,  counseling/discussion   . Palliative care by specialist   . Pneumonia due to COVID-19 virus 01/26/2020  . Acute respiratory failure due to COVID-19 ([place]) 01/21/2020  . Depression 01/05/2020  . Chronic atrial fibrillation ([place]) 12/12/2019  . Closed wedge compression fracture of L5 vertebra with routine healing 06/20/2018  . Constipation due to opioid therapy 06/13/2018  . Shingles outbreak 03/23/2018  . Chronic constipation 02/21/2018  . Chronic back pain 02/21/2018  . Chronic cerebrovascular accident (CVA) 02/21/2018  . Hypokalemia 02/21/2018  . Neuropathic pain 02/14/2018  . Chronic cough 07/23/2017  . Closed fracture of shaft of right radius 07/23/2017  . Hemiplegia and hemiparesis following cerebral infarction affecting left non-dominant side ([place]) 08/28/2016  . Hx of falling 08/28/2016  . Dysphagia following cerebral infarction 08/28/2016  . Dysphagia, oropharyngeal 08/28/2016  . Cognitive communication deficit 08/28/2016  . Hypertensive chronic kidney disease w stg 1-4/unsp chr kdny 08/28/2016  . Chronic kidney disease, stage III (moderate) 08/28/2016  . Vitamin B12 deficiency anemia due to intrinsic factor deficiency 08/28/2016  . Anxiety disorder 08/28/2016  . Insomnia 08/28/2016  . Major depressive disorder, single episode in full remission ([place]) 08/28/2016  . Dry eye syndrome of both lacrimal glands 08/28/2016  . Allergic rhinitis 08/28/2016  . Bell's palsy 08/28/2016  . Chronic pain 08/28/2016  . Hyperlipidemia 08/28/2016  . GERD (gastroesophageal reflux disease) 08/28/2016  . Closed compression fracture of L2 lumbar vertebra, sequela 04/03/2016  . Paroxysmal atrial fibrillation (HCC)   . Chronic obstructive pulmonary disease (COPD) ([place]) 11/09/2015  . Benign essential HTN 10/09/2014  . OP (osteoporosis) 10/09/2014  . [place]ison anemia 10/09/2014  . Tobacco abuse, in remission 08/02/2014  . Nonrheumatic aortic valve insufficiency 07/07/2014  . Aneurysm, ascending aorta  ([place]) 07/07/2014    Palliative Care Assessment & Plan   Recommendations/Plan:  Would recommend psychiatry consult for decision making capacity  to see if she can make the decisions on [place] and code status herself. Recommend request for family to bring in [place] papers to determine who the surrogate decision maker is, if it is determined one is needed.     Code Status:    Code Status Orders  (From admission, onward)         Start     Ordered   01/19/20 1148  Full code  Continuous        01/19/20 1147        Code Status History    Date Active Date Inactive Code Status Order ID Comments User Context   01/19/2020 1144 01/19/2020 1147 DNR [id number]  Darliss Cheney, MD Inpatient   01/28/2020 1144 01/19/2020 1144 Full Code [id number]  Collier Bullock, MD ED   06/17/2018 1259 06/17/2018 1635 Full Code [id number]  Hessie Knows, MD Inpatient   08/26/2016 1535 08/26/2016 1943 Full Code [id number]  Hessie Knows, MD Inpatient   02/25/2016 1126 03/01/2016 1431 Full Code [id number]  Harrie Foreman, MD Inpatient   02/25/2016 0622 02/25/2016 1126 Full Code [id number]  Earnestine Leys, MD ED   Advance Care Planning Activity    Advance Directive Documentation     Most Recent Value  Type of Advance Directive Healthcare Power of Attorney  Pre-existing out of facility DNR order (yellow form or pink MOST form) --  "MOST" Form in Place? --       Prognosis:   Unable to determine    Care plan was discussed with RN  Thank you for allowing the Palliative Medicine Team to assist in the care of this patient.   Total Time 15 min Prolonged Time Billed  no    COVID-19 DISASTER DECLARATION:    FULL CONTACT PHYSICAL EXAMINATION WAS NOT POSSIBLE DUE TO TREATMENT OF COVID-19  AND CONSERVATION OF PERSONAL PROTECTIVE EQUIPMENT   Patient assessed or the symptoms described in the history of present illness.  In the context of the Global COVID-19 pandemic, which necessitated consideration that the patient  might be at risk for infection with the SARS-CoV-2 virus that causes COVID-19, Institutional protocols and algorithms that pertain to the evaluation of patients at risk for COVID-19 are in a state of rapid change based on information released by regulatory bodies including the CDC and federal and state organizations. These policies and algorithms were followed during the patient's care while in hospital.     Greater than 50%  of this time was spent counseling and coordinating care related to the above assessment and plan.  Asencion Gowda, NP  Please contact Palliative Medicine Team phone at [phone] for questions and concerns.

## 2020-01-20 NOTE — Progress Notes (Signed)
PT blood pressure is 135/49. Dialostic bp has been trending at this range. PT denies any discomfort.

## 2020-01-20 NOTE — Progress Notes (Signed)
Called Ana Schultz [place] and updated  Him on pts condition. Ana Schultz reported to me that a friend of theirs and ccu nurse would be coming tomorrow 01-21-20  To visit pt and facetime with  pts family. Also he reported that speech therapist happy would be visiting pt and  Checking on her.reported this to charge nurse.

## 2020-01-21 LAB — PHOSPHORUS: Phosphorus: 3.9 mg/dL (ref 2.5–4.6)

## 2020-01-21 LAB — CBC WITH DIFFERENTIAL/PLATELET
Abs Immature Granulocytes: 0.11 10*3/uL — ABNORMAL HIGH (ref 0.00–0.07)
Basophils Absolute: 0 10*3/uL (ref 0.0–0.1)
Basophils Relative: 0 %
Eosinophils Absolute: 0 10*3/uL (ref 0.0–0.5)
Eosinophils Relative: 0 %
HCT: 31.6 % — ABNORMAL LOW (ref 36.0–46.0)
Hemoglobin: 10.3 g/dL — ABNORMAL LOW (ref 12.0–15.0)
Immature Granulocytes: 2 %
Lymphocytes Relative: 8 %
Lymphs Abs: 0.5 10*3/uL — ABNORMAL LOW (ref 0.7–4.0)
MCH: 28.1 pg (ref 26.0–34.0)
MCHC: 32.6 g/dL (ref 30.0–36.0)
MCV: 86.1 fL (ref 80.0–100.0)
Monocytes Absolute: 0.3 10*3/uL (ref 0.1–1.0)
Monocytes Relative: 4 %
Neutro Abs: 5.9 10*3/uL (ref 1.7–7.7)
Neutrophils Relative %: 86 %
Platelets: 212 10*3/uL (ref 150–400)
RBC: 3.67 MIL/uL — ABNORMAL LOW (ref 3.87–5.11)
RDW: 14.7 % (ref 11.5–15.5)
WBC: 6.8 10*3/uL (ref 4.0–10.5)
nRBC: 0 % (ref 0.0–0.2)

## 2020-01-21 LAB — COMPREHENSIVE METABOLIC PANEL
ALT: 18 U/L (ref 0–44)
AST: 28 U/L (ref 15–41)
Albumin: 3.6 g/dL (ref 3.5–5.0)
Alkaline Phosphatase: 87 U/L (ref 38–126)
Anion gap: 13 (ref 5–15)
BUN: 57 mg/dL — ABNORMAL HIGH (ref 8–23)
CO2: 24 mmol/L (ref 22–32)
Calcium: 8.7 mg/dL — ABNORMAL LOW (ref 8.9–10.3)
Chloride: 110 mmol/L (ref 98–111)
Creatinine, Ser: 1.32 mg/dL — ABNORMAL HIGH (ref 0.44–1.00)
GFR calc Af Amer: 40 mL/min — ABNORMAL LOW (ref >60)
GFR calc non Af Amer: 35 mL/min — ABNORMAL LOW (ref >60)
Glucose, Bld: 159 mg/dL — ABNORMAL HIGH (ref 70–99)
Potassium: 4.5 mmol/L (ref 3.5–5.1)
Sodium: 147 mmol/L — ABNORMAL HIGH (ref 135–145)
Total Bilirubin: 0.8 mg/dL (ref 0.3–1.2)
Total Protein: 6.5 g/dL (ref 6.5–8.1)

## 2020-01-21 LAB — FERRITIN: Ferritin: 209 ng/mL (ref 11–307)

## 2020-01-21 LAB — MRSA PCR SCREENING: MRSA by PCR: POSITIVE — AB

## 2020-01-21 LAB — FIBRIN DERIVATIVES D-DIMER (ARMC ONLY): Fibrin derivatives D-dimer (ARMC): >7500 ng/mL (FEU) — ABNORMAL HIGH (ref 0.00–499.00)

## 2020-01-21 LAB — MAGNESIUM: Magnesium: 2.6 mg/dL — ABNORMAL HIGH (ref 1.7–2.4)

## 2020-01-21 LAB — GLUCOSE, CAPILLARY: Glucose-Capillary: 132 mg/dL — ABNORMAL HIGH (ref 70–99)

## 2020-01-21 LAB — C-REACTIVE PROTEIN: CRP: 10.2 mg/dL — ABNORMAL HIGH (ref <1.0)

## 2020-01-21 MED ORDER — MUPIROCIN 2 % EX OINT
1.0000 "application " | TOPICAL_OINTMENT | Freq: Two times a day (BID) | CUTANEOUS | Status: DC
  Administered 2020-01-21: 1 via NASAL
  Filled 2020-01-21: qty 22

## 2020-01-21 MED ORDER — FUROSEMIDE 10 MG/ML IJ SOLN
40.0000 mg | Freq: Once | INTRAMUSCULAR | Status: AC
  Administered 2020-01-21: 40 mg via INTRAVENOUS
  Filled 2020-01-21: qty 4

## 2020-01-21 MED ORDER — LABETALOL HCL 5 MG/ML IV SOLN
10.0000 mg | INTRAVENOUS | Status: DC | PRN
  Administered 2020-01-21: 10 mg via INTRAVENOUS
  Filled 2020-01-21: qty 4

## 2020-01-21 MED ORDER — CHLORHEXIDINE GLUCONATE CLOTH 2 % EX PADS
6.0000 | MEDICATED_PAD | Freq: Every day | CUTANEOUS | Status: DC
  Administered 2020-01-21: 6 via TOPICAL

## 2020-01-21 NOTE — Progress Notes (Signed)
  Chaplain On-Call received Rapid Response notification on pager.  Learned that the patient was moved quickly to the ICU, bed 10. Chaplain was unable to provide spiritual assessment due to condition of patient.  Chaplain will follow-up as able, and will refer to the next On-Call Chaplain on Sunday.  Ana Schultz M.Div., [facility]

## 2020-01-21 NOTE — Consult Note (Addendum)
[facility] Consult   Reason for Consult:  Assess issues with Capacity for Consent in face of COVID pneumonia and resuscitation issues  Referring Physician:   Pahwani  Patient Identification: Ana Schultz MRN:  [id number] Principal Diagnosis: Pneumonia due to COVID-19 virus Diagnosis:  Principal Problem:   Pneumonia due to COVID-19 virus Active Problems:   Benign essential HTN   Chronic obstructive pulmonary disease (COPD) ([place])   Hemiplegia and hemiparesis following cerebral infarction affecting left non-dominant side ([place])   Major depressive disorder, single episode in full remission ([place])   Chronic atrial fibrillation ([place])   Acute respiratory failure due to COVID-19 [facility])   Depression   Goals of care, counseling/discussion   Palliative care by specialist History of dementia but do not have eval here     Total Time spent with patient:  Close to one hour  Subjective:   Ana Schultz is a 86 y.o. female patient admitted with  Covid neumonia who is now waxing and waning with her comfort, oxygenation and general respiratory status  She has a history of dementia by history but I do not see forma neuropsych testing and eval  She currently lives in NH and was brought here for worsening symptoms   Asked to assess capacity for consent and her wishes   I spent time with her and asked her life history and generalities ---she is [facility] and the room has a giant fan on  However she was alert and answered questions She is thin sickly looking gaunt and frail somewhat not comfortable, says sometimes she feels okay and sometimes hard to breathe  Her rapport is as best as possible She tried to be cooperative She opens eyes and makes contact when asked Her concentration and attention are not severely impaired  Her consciousness is not clouded or fluctuant Speech is somewhat low but understandable without speech impediments  She is oriented to person place and part of  date she thought it was the end of Sept 2021  She has long term memory retention with birth date and city   But does not know her current address.  She had trouble with the name of the NH  She has some short term memory loss where she did not recall any physician asking her about resuscitation and she thought she saw me before  Which she had not.    I asked her in three diff ways and times what her wishes are if she takes a turn for the worse and she first said, "I want to just go peacefully "---I asked her to clarify and she said to just pass on into heaven"  Second time some time later I asked her if she wanted to have CPR and tube placement if things got very worse or what her wishes were and her thoughts and she said emphatically no, she "wants only comfort measures"---and "that too not much fluid or medicines"---  These were open ended questions.   I then told her that previously she reported she wanted full measures done including CPR, code procedures and breathing tube placement and I even physically demonstrated what that would be like and she said she did not want t hat ---and again said she "only wanted to have just comfort.   I asked her if she recalled specifically if another MD came ---or her attending (by name ) and asked her if she told him the opposite (full resuscitation) and she said she did not recall that at  all and she did not recall anyone asking.    (her RN reported that she also asked )---she had no recollection  I asked general questions re: immediate and mainly short term memory where there were general errors and ommissions  Many " I do not remember " answers  She could not do 3/3 objects after a few minutes and she could not fully cooperative with serial 7's and all due to discomfort   She has no active SI HI or plans  She has no frank mania or psychosis She has some generalized anxiety   MS limited due to overall status  Judgement insight and reliability waxes  and wanes   However she does understand the nature of her illness and what would happen if nothing or minimal would be done for her and she again said " I would pass away" peacefully" and the oxygen she has now is enough and that if it were to not work she does not want further interventions"  Cannot assess frank intelligence, fund of knowledge, / No frank shakes tics tremors  She is not internally distracted  Handedness not known  Leans --n/a  Recall poor Akathisia none Cognition in chronic decline  Sleep erratic  Assets ---has some f amily members and support  ADL's  Needs assistance Language English  Somewhat psych motor agitated due to oxygenation and discomfort   She does not have recent admissions for depression but is on chronic zoloft   She has no substance drug or ETOH history   She says there is no family history of Psychiatry but most likely is not reliable   She is retired and lives in [place]   She did say that [name] has power of attorney but she does not recall full details    A/P    Patient at this time does not have full capacity for consent.  She has a history of dementia ( we need that full eval if it was done or if there is the impression over time)--  She has short term memory problems and so today she saying to me that she wants comfort measures   She does not recall seeing anyone else and saying she wanted full code asked in different ways   Since it is not consistent ---today is her desire outlined above.   I have not spoken to grandson --but to avoid conflict and splitting  ---SW can ask if he has actual documents or not   Based on her current statements at this time though, despite lack of full capacity   She says she only wants comfort measures    HPI:   See above   Past Psychiatric History: as above   Risk to Self:  none  Risk to Others:  none  Prior Inpatient Therapy:  none recently  Prior Outpatient Therapy:   None recently  Past  Medical History:  Past Medical History:  Diagnosis Date  . Adenoma of rectum   . Adrenal adenoma   . Anemia   . Anemia, unspecified   . Arthritis   . Ascending aortic aneurysm ([place])    a. s/p repair 07/2014  . Atrophic vaginitis   . Benign neoplasm of colon, unspecified   . COPD (chronic obstructive pulmonary disease) ([place])   . Coronary artery disease, non-occlusive    a. LHC 06/2014 without evidence of obstructive disease  . Depression   . Diverticulosis   . Dysphagia   . Dysrhythmia   . Essential hypertension  benign  . Frequent UTI   . GERD (gastroesophageal reflux disease)   . Hemiplegia and hemiparesis    following cerebral infarction affecting left non-dominant side  . Hypertension   . Microscopic hematuria   . Nonrheumatic aortic valve insufficiency   . Osteoporosis    unspecified  . Panic attacks   . Paroxysmal atrial fibrillation (H[facility])   . Pernicious anemia   . Renal cyst    CKD STAGE 3  . Stroke ([place]) 07/23/2014   a. post-operative setting in 07/2014 leading to left UE hemiapresis and left LE weakness  . Urge incontinence     Past Surgical History:  Procedure Laterality Date  . ABDOMINAL HYSTERECTOMY  1972  . AORTOILIAC BYPASS    . ASCENDING AORTIC ANEURYSM REPAIR  07/23/2014   07/2014  . BLEPHAROPLASTY    . CATARACT EXTRACTION  2005  . GASTROSTOMY W/ FEEDING TUBE    . INTRAMEDULLARY (IM) NAIL INTERTROCHANTERIC Left 02/26/2016   Procedure: INTRAMEDULLARY (IM) NAIL INTERTROCHANTRIC;  Surgeon: Earnestine Leys, MD;  Location: [facility] ORS;  Service: Orthopedics;  Laterality: Left;  . KYPHOPLASTY N/A 08/26/2016   Procedure: KYPHOPLASTY T12;  Surgeon: Hessie Knows, MD;  Location: [facility] ORS;  Service: Orthopedics;  Laterality: N/A;  . KYPHOPLASTY N/A 06/17/2018   Procedure: L5 KYPHOPLASTY;  Surgeon: Hessie Knows, MD;  Location: [facility] ORS;  Service: Orthopedics;  Laterality: N/A;  . LEFT HEART CATH  06/2014  . PR ASCEND AORTIC GRAFT INCL VALVE SUSPENSION  07/07/2014    Procedure: ASCENDING AORTA GRAFT, WITH CARDIOPULMONARY BYPASS, WITH OR WITHOUT VALVE SUSPENSION; Surgeon: Dwaine Deter, MD; Location: MAIN OR [facility]; Service: Cardiothoracic  . PR LAP, GASTROTOMY W/O TUBE CONSTR  08/08/2014   Procedure: LAPAROSCOPY, SURGICAL; GASTOSTOMY W/O CONSTRUCTION OF GASTRIC TUBE (EG, STAMM PROCEDURE)(SEPARATE PROCED); Surgeon: Fredrik Rigger, MD; Location: MAIN OR [facility]; Service: Gastrointestinal  . PR PLACE PERCUT GASTROSTOMY TUBE  08/08/2014   Procedure: UGI ENDO; W/DIRECTED PLCMT PERQ GASTROSTOMY TUBE; Surgeon: Fredrik Rigger, MD; Location: MAIN OR [facility]; Service: Gastrointestinal   Family History:  Family History  Problem Relation Age of Onset  . Lung cancer Son   . CAD Father   . Heart attack Father   . CAD Brother   . Heart attack Brother    Family Psychiatric  History:  She is not clear and cannot recall Social History:  Social History   Substance and Sexual Activity  Alcohol Use No   Comment: occasionally     Social History   Substance and Sexual Activity  Drug Use No    Social History   Socioeconomic History  . Marital status: Widowed    Spouse name: Not on file  . Number of children: 3  . Years of education: 69  . Highest education level: Not on file  Occupational History  . Not on file  Tobacco Use  . Smoking status: Former Smoker    Packs/day: 0.25    Years: 50.00    Pack years: 12.50    Types: Cigarettes  . Smokeless tobacco: Never Used  . Tobacco comment: 4-5 cigarettes per day   Vaping Use  . Vaping Use: Never used  Substance and Sexual Activity  . Alcohol use: No    Comment: occasionally  . Drug use: No  . Sexual activity: Not on file  Other Topics Concern  . Not on file  Social History Narrative   Admitted to [facility] 03/01/2016   Widowed   4 children   Former Smoker  Occasionally drinks   DNR   Social Determinants of Health   Financial Resource Strain:   . Difficulty of Paying  Living Expenses: Not on file  Food Insecurity:   . Worried About Charity fundraiser in the Last Year: Not on file  . Ran Out of Food in the Last Year: Not on file  Transportation Needs:   . Lack of Transportation (Medical): Not on file  . Lack of Transportation (Non-Medical): Not on file  Physical Activity:   . Days of Exercise per Week: Not on file  . Minutes of Exercise per Session: Not on file  Stress:   . Feeling of Stress : Not on file  Social Connections:   . Frequency of Communication with Friends and Family: Not on file  . Frequency of Social Gatherings with Friends and Family: Not on file  . Attends Religious Services: Not on file  . Active Member of Clubs or Organizations: Not on file  . Attends Archivist Meetings: Not on file  . Marital Status: Not on file   Additional Social History:    Allergies:   Allergies  Allergen Reactions  . Iodinated Diagnostic Agents Itching and Swelling    Other reaction(s): Unknown  . Nitrofurantoin Diarrhea    Other reaction(s): Unknown  . Omnipaque [Iohexol]   . Solifenacin Other (See Comments)    indigestion  . Ciprofloxacin Other (See Comments) and Rash    Colitis  . Metronidazole Itching and Rash  . Sulfa Antibiotics Itching and Rash    Labs:  Results for orders placed or performed during the hospital encounter of 01/05/2020 (from the past 48 hour(s))  CBC with Differential/Platelet     Status: Abnormal   Collection Time: 01/20/20  4:52 AM  Result Value Ref Range   WBC 4.3 4.0 - 10.5 K/uL   RBC 3.38 (L) 3.87 - 5.11 MIL/uL   Hemoglobin 9.7 (L) 12.0 - 15.0 g/dL   HCT 29.2 (L) 36 - 46 %   MCV 86.4 80.0 - 100.0 fL   MCH 28.7 26.0 - 34.0 pg   MCHC 33.2 30.0 - 36.0 g/dL   RDW 14.4 11.5 - 15.5 %   Platelets 239 150 - 400 K/uL   nRBC 0.0 0.0 - 0.2 %   Neutrophils Relative % 75 %   Neutro Abs 3.2 1.7 - 7.7 K/uL   Lymphocytes Relative 18 %   Lymphs Abs 0.8 0.7 - 4.0 K/uL   Monocytes Relative 4 %   Monocytes  Absolute 0.2 0 - 1 K/uL   Eosinophils Relative 0 %   Eosinophils Absolute 0.0 0 - 0 K/uL   Basophils Relative 0 %   Basophils Absolute 0.0 0 - 0 K/uL   WBC Morphology MORPHOLOGY UNREMARKABLE    Smear Review Normal platelet morphology    Immature Granulocytes 3 %   Abs Immature Granulocytes 0.11 (H) 0.00 - 0.07 K/uL   Schistocytes PRESENT    Burr Cells PRESENT     Comment: Performed at [facility], [address] [place], [place] [address]  Comprehensive metabolic panel     Status: Abnormal   Collection Time: 01/20/20  4:52 AM  Result Value Ref Range   Sodium 145 135 - 145 mmol/L   Potassium 5.0 3.5 - 5.1 mmol/L   Chloride 110 98 - 111 mmol/L   CO2 25 22 - 32 mmol/L   Glucose, Bld 129 (H) 70 - 99 mg/dL    Comment: Glucose reference range applies only  to samples taken after fasting for at least 8 hours.   BUN 53 (H) 8 - 23 mg/dL   Creatinine, Ser 1.43 (H) 0.44 - 1.00 mg/dL   Calcium 8.3 (L) 8.9 - 10.3 mg/dL   Total Protein 5.9 (L) 6.5 - 8.1 g/dL   Albumin 3.3 (L) 3.5 - 5.0 g/dL   AST 30 15 - 41 U/L   ALT 15 0 - 44 U/L   Alkaline Phosphatase 66 38 - 126 U/L   Total Bilirubin 0.8 0.3 - 1.2 mg/dL   GFR calc non Af Amer 32 (L) >60 mL/min   GFR calc Af Amer 37 (L) >60 mL/min   Anion gap 10 5 - 15    Comment: Performed at [facility], [address] [place], [place] [address]  C-reactive protein     Status: Abnormal   Collection Time: 01/20/20  4:52 AM  Result Value Ref Range   CRP 11.7 (H) <1.0 mg/dL    Comment: Performed at [place] [address], [place], [place] [address]  Fibrin derivatives D-[facility] only)     Status: Abnormal   Collection Time: 01/20/20  4:52 AM  Result Value Ref Range   Fibrin derivatives D-dimer ([facility]) 4,007.85 (H) 0.00 - 499.00 ng/mL (FEU)    Comment: (NOTE) <> Exclusion of Venous Thromboembolism (VTE) - OUTPATIENT ONLY   (Emergency Department or [place])    0-499 ng/ml (FEU): With a low to intermediate pretest  probability                      for VTE this test result excludes the diagnosis                      of VTE.   >499 ng/ml (FEU) : VTE not excluded; additional work up for VTE is                      required.  <> Testing on Inpatients and Evaluation of Disseminated Intravascular   Coagulation (DIC) Reference Range:   0-499 ng/ml (FEU) Performed at [facility], [address] [place], [place] [address]   Ferritin     Status: None   Collection Time: 01/20/20  4:52 AM  Result Value Ref Range   Ferritin 279 11 - 307 ng/mL    Comment: Performed at [facility], [address] [place], [place] [address]  Magnesium     Status: Abnormal   Collection Time: 01/20/20  4:52 AM  Result Value Ref Range   Magnesium 2.6 (H) 1.7 - 2.4 mg/dL    Comment: Performed at [facility], [address] [place], [place] [address]  Phosphorus     Status: Abnormal   Collection Time: 01/20/20  4:52 AM  Result Value Ref Range   Phosphorus 4.8 (H) 2.5 - 4.6 mg/dL    Comment: Performed at [facility], [address] [place], [place] [address]  CBC with Differential/Platelet     Status: Abnormal   Collection Time: 01/21/20  6:37 AM  Result Value Ref Range   WBC 6.8 4.0 - 10.5 K/uL   RBC 3.67 (L) 3.87 - 5.11 MIL/uL   Hemoglobin 10.3 (L) 12.0 - 15.0 g/dL   HCT 31.6 (L) 36 - 46 %   MCV 86.1 80.0 - 100.0 fL   MCH 28.1 26.0 - 34.0 pg   MCHC 32.6 30.0 - 36.0 g/dL   RDW 14.7  11.5 - 15.5 %   Platelets 212 150 - 400 K/uL   nRBC 0.0 0.0 - 0.2 %   Neutrophils Relative % 86 %   Neutro Abs 5.9 1.7 - 7.7 K/uL   Lymphocytes Relative 8 %   Lymphs Abs 0.5 (L) 0.7 - 4.0 K/uL   Monocytes Relative 4 %   Monocytes Absolute 0.3 0 - 1 K/uL   Eosinophils Relative 0 %   Eosinophils Absolute 0.0 0 - 0 K/uL   Basophils Relative 0 %   Basophils Absolute 0.0 0 - 0 K/uL   Immature Granulocytes 2 %   Abs Immature Granulocytes 0.11 (H) 0.00 - 0.07 K/uL    Comment: Performed at  [facility], [address], [place], [place] [address]  Comprehensive metabolic panel     Status: Abnormal   Collection Time: 01/21/20  6:37 AM  Result Value Ref Range   Sodium 147 (H) 135 - 145 mmol/L   Potassium 4.5 3.5 - 5.1 mmol/L   Chloride 110 98 - 111 mmol/L   CO2 24 22 - 32 mmol/L   Glucose, Bld 159 (H) 70 - 99 mg/dL    Comment: Glucose reference range applies only to samples taken after fasting for at least 8 hours.   BUN 57 (H) 8 - 23 mg/dL   Creatinine, Ser 1.32 (H) 0.44 - 1.00 mg/dL   Calcium 8.7 (L) 8.9 - 10.3 mg/dL   Total Protein 6.5 6.5 - 8.1 g/dL   Albumin 3.6 3.5 - 5.0 g/dL   AST 28 15 - 41 U/L   ALT 18 0 - 44 U/L   Alkaline Phosphatase 87 38 - 126 U/L   Total Bilirubin 0.8 0.3 - 1.2 mg/dL   GFR calc non Af Amer 35 (L) >60 mL/min   GFR calc Af Amer 40 (L) >60 mL/min   Anion gap 13 5 - 15    Comment: Performed at [facility], [address] [place], [place] [address]  C-reactive protein     Status: Abnormal   Collection Time: 01/21/20  6:37 AM  Result Value Ref Range   CRP 10.2 (H) <1.0 mg/dL    Comment: Performed at [place] [address] [place], [place] [address]  Fibrin derivatives D-Dimer [facility] only)     Status: Abnormal   Collection Time: 01/21/20  6:37 AM  Result Value Ref Range   Fibrin derivatives D-dimer ([facility]) >7,500.00 (H) 0.00 - 499.00 ng/mL (FEU)    Comment: (NOTE) <> Exclusion of Venous Thromboembolism (VTE) - OUTPATIENT ONLY   (Emergency Department or [place])    0-499 ng/ml (FEU): With a low to intermediate pretest probability                      for VTE this test result excludes the diagnosis                      of VTE.   >499 ng/ml (FEU) : VTE not excluded; additional work up for VTE is                      required.  <> Testing on Inpatients and Evaluation of Disseminated Intravascular   Coagulation (DIC) Reference Range:   0-499 ng/ml (FEU) Performed at [facility], [address],  [place], [place] [address]   Ferritin     Status: None   Collection Time: 01/21/20  6:37 AM  Result Value  Ref Range   Ferritin 209 11 - 307 ng/mL    Comment: Performed at [facility], [address] [place], [place] [address]  Magnesium     Status: Abnormal   Collection Time: 01/21/20  6:37 AM  Result Value Ref Range   Magnesium 2.6 (H) 1.7 - 2.4 mg/dL    Comment: Performed at [facility], [address] [place], [place] [address]  Phosphorus     Status: None   Collection Time: 01/21/20  6:37 AM  Result Value Ref Range   Phosphorus 3.9 2.5 - 4.6 mg/dL    Comment: Performed at [facility], [address] [place], [place] [address]    Current Facility-Administered Medications  Medication Dose Route Frequency Provider Last Rate Last Admin  . 0.9 %  sodium chloride infusion  250 mL Intravenous PRN Agbata, Tochukwu, MD   Stopped at 01/19/20 1439  . acetaminophen (TYLENOL) tablet 650 mg  650 mg Oral BID Agbata, Tochukwu, MD   650 mg at 01/21/20 0818  . acetaminophen (TYLENOL) tablet 650 mg  650 mg Oral Q6H PRN Agbata, Tochukwu, MD      . albuterol (VENTOLIN HFA) 108 (90 Base) MCG/ACT inhaler 2 puff  2 puff Inhalation Q4H PRN Agbata, Tochukwu, MD      . alum & mag hydroxide-simeth (MAALOX/MYLANTA) 200-200-20 MG/5ML suspension 30 mL  30 mL Oral Q4H PRN Agbata, Tochukwu, MD      . ascorbic acid (VITAMIN C) tablet 500 mg  500 mg Oral Daily Agbata, Tochukwu, MD   500 mg at 01/21/20 0818  . aspirin EC tablet 81 mg  81 mg Oral Daily Agbata, Tochukwu, MD   81 mg at 01/21/20 0817  . baricitinib (OLUMIANT) tablet 2 mg  2 mg Oral Daily Benita Gutter, RPH   2 mg at 01/21/20 0817  . benzonatate (TESSALON) capsule 100 mg  100 mg Oral TID PRN Agbata, Tochukwu, MD      . diclofenac sodium (VOLTAREN) 1 % transdermal gel 4 g  4 g Topical TID PRN Agbata, Tochukwu, MD      . diltiazem (CARDIZEM) tablet 60 mg  60 mg Oral Daily Agbata, Tochukwu, MD   60 mg at 01/21/20 0817  .  docusate (COLACE) 50 MG/5ML liquid 100 mg  100 mg Oral BID Darliss Cheney, MD   100 mg at 01/21/20 0818  . enoxaparin (LOVENOX) injection 30 mg  30 mg Subcutaneous Q24H Agbata, Tochukwu, MD   30 mg at 01/20/20 2219  . fluticasone furoate-vilanterol (BREO ELLIPTA) 100-25 MCG/INH 1 puff  1 puff Inhalation Daily Agbata, Tochukwu, MD   1 puff at 01/21/20 0823  . gabapentin (NEURONTIN) capsule 300 mg  300 mg Oral Q12H Agbata, Tochukwu, MD   300 mg at 01/21/20 0817  . guaiFENesin-dextromethorphan (ROBITUSSIN DM) 100-10 MG/5ML syrup 10 mL  10 mL Oral Q4H PRN Agbata, Tochukwu, MD   10 mL at 01/19/20 1322  . lidocaine (LIDODERM) 5 %   Transdermal Daily Agbata, Tochukwu, MD   1 patch at 01/21/20 0816  . methocarbamol (ROBAXIN) tablet 500 mg  500 mg Oral Q8H PRN Agbata, Tochukwu, MD      . montelukast (SINGULAIR) tablet 10 mg  10 mg Oral QHS Agbata, Tochukwu, MD   10 mg at 01/20/20 2127  . ondansetron (ZOFRAN) tablet 4 mg  4 mg Oral Q6H PRN Agbata, Tochukwu, MD       Or  . ondansetron (ZOFRAN) injection 4 mg  4 mg Intravenous Q6H PRN  Agbata, Tochukwu, MD      . oxyCODONE (Oxy IR/ROXICODONE) immediate release tablet 5 mg  5 mg Oral Q12H PRN Agbata, Tochukwu, MD      . pantoprazole (PROTONIX) EC tablet 40 mg  40 mg Oral Daily Agbata, Tochukwu, MD   40 mg at 01/21/20 0823  . polyethylene glycol (MIRALAX / GLYCOLAX) packet 17 g  17 g Oral BID PRN Agbata, Tochukwu, MD      . polyvinyl alcohol (LIQUIFILM TEARS) 1.4 % ophthalmic solution 2 drop  2 drop Both Eyes TID PRN Agbata, Tochukwu, MD      . Derrill Memo ON 02-12-2020] predniSONE (DELTASONE) tablet 50 mg  50 mg Oral Daily Agbata, Tochukwu, MD      . remdesivir 100 mg in sodium chloride 0.9 % 100 mL IVPB  100 mg Intravenous Daily Vladimir Crofts, MD 200 mL/hr at 01/21/20 0840 100 mg at 01/21/20 0840  . rOPINIRole (REQUIP) tablet 0.25 mg  0.25 mg Oral QHS Darliss Cheney, MD   0.25 mg at 01/20/20 2127  . sertraline (ZOLOFT) tablet 150 mg  150 mg Oral Daily Benita Gutter,  RPH   150 mg at 01/21/20 0817  . sodium chloride flush (NS) 0.9 % injection 3 mL  3 mL Intravenous Q12H Agbata, Tochukwu, MD   3 mL at 01/21/20 0842  . sodium chloride flush (NS) 0.9 % injection 3 mL  3 mL Intravenous PRN Agbata, Tochukwu, MD      . zinc sulfate capsule 220 mg  220 mg Oral Daily Agbata, Tochukwu, MD   220 mg at 01/21/20 1017    Musculoskeletal: Strength & Muscle Tone: frail  Gait & Station: limited  Patient leans: n/a   Psychiatric Specialty Exam: Physical Exam  Review of Systems  Blood pressure (!) 139/105, pulse 90, temperature 98.1 F (36.7 C), resp. rate (!) 24, height 5\' 2"  (1.575 m), weight 52.1 kg, SpO2 90 %.Body mass index is 21.01 kg/m.    Treatment Plan Summary:  Does not meet capacity for consent based on short term memory issues in background of dementia history   However her wishes today times three are to have no resuscitation or tube placement with comfort measures only   [place] Son with alleged power of attorney has been insisting on the opposite of comfort measures and had been reported to want and express the need for full code.    However CW can assist by seeing if he has actual documents or collateral from other relatives   Have not spoken to him to avoid further escalation and or splitting between clinicians and teams.  At this time.       Disposition:  Remains in covid care unit at this time.  Eulas Post, MD 01/21/2020 2:56 PM

## 2020-01-21 NOTE — Progress Notes (Signed)
Spoke with patient's DR on phone, he stated that grandson was very rude to him and he stated to grandson that if patient's status changes or any pertinent information he would call otherwise he will not update grandson.  Before calling grandson, I asked patient how she felt about her code status. She asked me how she seemed and if she was [place] make it through this.......I stated to her that we are seeing some COVID patients that are needing a little extra oxygen support. So then I asked her if she fell into an instance where she was unresponsive how would she want [place] to care for her. She stated " I want you to do what you can, but if Im gonna suffer let me go." after our discussion, I called grandson. She stated same response to grandson. [name] was aware that I was present in room. Notified Dr Darliss Cheney of discussion.

## 2020-01-21 NOTE — Progress Notes (Addendum)
Informed about patient's video call with the grandson.  After that, we have found out that there is a DNR form in chart and according to that form, patient was deemed DNR on 09/10/2015 and this form has been uploaded on 08/16/2019.  Had a long discussion with social worker who is going to talk to her leadership team tomorrow and do some more research about patient and then ask grandson to provide the papers of healthcare power of attorney.  However, patient now is requiring more oxygen according to the message I received from nurse few minutes ago.  I then called house supervisor and requested to call patient's grandson and ask to provide the document for healthcare power of attorney.  She stated that she will do that.

## 2020-01-21 NOTE — Progress Notes (Addendum)
PROGRESS NOTE    LATRENA BENEGAS  UXL:[id number] DOB: 1925-12-03 DOA: 01/30/2020 PCP: Juluis Pitch, MD   Brief Narrative:  HPI: Ana Schultz is a 86 y.o. female with medical history significant for CVA with residual left-sided weakness, history of recurrent falls currently wheelchair dependent, history of chronic respiratory failure on 3 L of oxygen, history of COPD, hypertension and stage III chronic kidney disease who was brought into the ER by EMS after she was found hypoxic, patient was noted to have room air pulse oximetry in the 40s and was placed on a nonrebreather mask, 15L with improvement in her pulse oximetry to 98% Patient tested positive for Covid within the last week and is fully vaccinated with mRNA vaccine from January/February 2021. Upon arrival to the ER patient had a fever with a T-max of 101F and was tachypneic with respiratory rate and was tachypneic with respiratory rate of 32. Labs show sodium of 138, potassium 4.7, chloride 96, bicarb 24, BUN 37, creatinine 1.4, calcium 8.1, albumin 3.6, AST 29, ALT 13, BNP 531, troponin 54, lactic acid 1.2, white count 2.9, hemoglobin 9.6, hematocrit 29.3, MCV 86.4, RDW 13.6, platelet count 186 Chest x-ray reviewed by me shows extensive bilateral airspace opacities consistent with Covid pneumonia Twelve-lead EKG reviewed by me shows sinus rhythm with PACs.  ED Course: Patient is a 86 year old female who resides in a skilled nursing facility and was sent to the ER for evaluation of shortness of breath and hypoxia.  Patient was noted to have room air pulse oximetry of 40% and was placed on a nonrebreather mask, 15L with improvement in her pulse oximetry to 98%. Chest x-ray is consistent with Covid pneumonia Patient received 2 doses of the COVID-19 vaccine in January.  She will be admitted to the hospital for further evaluation.  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 Patient resides in a skilled nursing facility and received the COVID-19 vaccine in Jan/Feb She tested positive for the COVID-19 virus about a week ago and then here in the hospital. Patient had room air pulse oximetry of 40% at the scene and this improved following oxygen supplementation with a nonrebreather mask at 15 L.  She uses 2 L of nasal oxygen at baseline due to her COPD.  She still remains on 15 L high flow oxygen.  She does not seem to have COPD exacerbation at this point in time.  Patient's inflammatory markers are improving.  Patient remains alert and oriented. Continue following: Remdesivir per pharmacy protocol, IV Solu-Medrol Baricitinib-patient's daughter consented to that. Supportive treatment, antitussive. Zinc and vitamin C. Patient was encouraged to prone, out of bed to chair, to use incentive spirometry and flutter valve.  History of paroxysmal A. Fib: Continue Cardizem for rate control Patient not on long-term anticoagulation therapy due to history of recurrent falls  History of CVA with left-sided hemiparesis Patient requires assistance with activities of daily  living Continue aspirin  Depression Continue sertraline  CKD stage IIIb: At baseline.  Monitor.  Medical decision capacity: Palliative care was consulted yesterday to help with making decisions about her CODE STATUS however, patient  was not seen physically by palliative care nurse practitioner due to Covid positive status, apparently palliative care nurse practitioner Asencion Gowda tried to call patient's room only once and patient did not pick up the phone so she could not have any conversation with the patient. Palliative care recommended consulting psychiatry to assess competency evaluation. Per her recommendations, I consulted psychiatry yesterday around 4:45 PM. I received a message from Dr. Janese Banks that he will let his night team know to do consultation however consultation was not done. I personally discussed the whole situation with Dr. Janese Banks today over the phone where I explained to him how important it is to determine capacity of this patient in order to follow the right thing or the right wishes of the patient. He stated that he will see patient today and also talk to grandson. He also requested that I consult TOC and try to obtain all the documents that patient's grandson has, if he has any done previously which states that patient has dementia and if she cannot make any decisions. I reached out to the [facility] and requested to obtain those documents from family/grandson.  DVT prophylaxis: enoxaparin (LOVENOX) injection 30 mg Start: 01/09/2020 2200   Code Status: Full Code  Family Communication: None present at bedside. I called her grandson bread around 1:15 PM today and provided him with the update. He was loud and upset due to the fact that he had reached out to nurses and demanded video chat with her grand mother and that has not happened yet. I offered him to escalated to the supervisors. I then started discussion about the fact how patient herself still remains alert and oriented and prefers to be DNR. I informed him that I had discussion with her where I let her know that Leroy Sea wants her to be full code. He became more upset at me on that and started raising his voice. I tried all the measures to calm him down and de-escalate the  situation but unfortunately, he continued to talk in loud voice without allowing me to talk much. I had to remind him to be respectful to our communication so we can have meaningful conversation.  He then accused me in the hospitalists of of keeping the patient hostage as they are not being allowed to come visit the patient.  I informed him that it is the hospital policy due to [id number] and family members are not allowed to visit patients but he can always talk to the patient via video chat as he is demanding.  He also started threatening to take legal action against hospital and mentioned " I will see you, watch yourself".  To everything I was saying, he was becoming more and more upset. I then let him know that I will need to hang up the phone here in order to de-escalate the situation and that from now onwards, I will not be calling him on daily basis for updates unless there will be significant change or unless he wants me to call him. I notified hospitalist director on call as well as house supervisor about this. Status is: Inpatient  Remains inpatient appropriate because:Inpatient level of care appropriate due to severity of illness   Dispo: The patient is from: SNF  Anticipated d/c is to: SNF              Anticipated d/c date is: > 3 days              Patient currently is not medically stable to d/c.        Estimated body mass index is 21.01 kg/m as calculated from the following:   Height as of this encounter: 5\' 2"  (1.575 m).   Weight as of this encounter: 52.1 kg.      Nutritional status:               Consultants:   Palliative care  Procedures:   None  Antimicrobials:  Anti-infectives (From admission, onward)   Start     Dose/Rate Route Frequency Ordered Stop   01/19/20 1000  remdesivir 100 mg in sodium chloride 0.9 % 100 mL IVPB       "Followed by" Linked Group Details   100 mg 200 mL/hr over 30 Minutes Intravenous Daily 01/16/2020 0958 01/23/20  0959   01/19/20 1000  remdesivir 100 mg in sodium chloride 0.9 % 100 mL IVPB  Status:  Discontinued       "Followed by" Linked Group Details   100 mg 200 mL/hr over 30 Minutes Intravenous Daily 01/12/2020 1144 01/31/2020 1148   01/11/2020 1145  remdesivir 200 mg in sodium chloride 0.9% 250 mL IVPB  Status:  Discontinued       "Followed by" Linked Group Details   200 mg 580 mL/hr over 30 Minutes Intravenous Once 01/30/2020 1144 01/19/2020 1148   01/17/2020 1030  remdesivir 200 mg in sodium chloride 0.9% 250 mL IVPB       "Followed by" Linked Group Details   200 mg 580 mL/hr over 30 Minutes Intravenous Once 01/05/2020 0958 01/08/2020 1248         Subjective:  Patient seen and examined. Despite of being on 15 L of high flow oxygen, patient was still alert and oriented. She denied having any shortness of breath and stated that she was feeling better than yesterday. She was able to answer all the questions I asked about orientation as well. I did ask her 1 more time about her CODE STATUS and she was very clear that she wants to be DNR. I then informed her that I had long discussion with her grandson bread over the phone yesterday and he wanted her to be full code based on my long conversation with him yesterday as it is mentioned in my note yesterday. Initially, she was quite in a started thinking. Then she said, "what ever he wants". I then felt that patient is saying that for his happiness so I asked her 1 more time if that is her final decision. She then said, " no, I do not want intubation, I want to be DNR".   Objective: Vitals:   01/21/20 0522 01/21/20 0800 01/21/20 1100 01/21/20 1326  BP: (!) 183/67 (!) 139/105    Pulse: 86 90    Resp: 18 (!) 24    Temp: (!) 97.5 F (36.4 C) 98 F (36.7 C) 98.1 F (36.7 C)   TempSrc: Axillary Oral    SpO2: 91% (!) 81%  90%  Weight:      Height:        Intake/Output Summary (Last 24 hours) at 01/21/2020 1404 Last data filed at 01/21/2020 0842 Gross per 24 hour   Intake 3 ml  Output --  Net 3 ml   [employer]  02/02/2020 0935 01/19/20 0410  Weight: 54.4 kg 52.1 kg    Examination:  General exam: Appears calm and comfortable  Respiratory system: Globally diminished breath sounds.Cl Respiratory effort normal. Cardiovascular system: S1 & S2 heard, RRR. No JVD, murmurs, rubs, gallops or clicks. No pedal edema. Gastrointestinal system: Abdomen is nondistended, soft and nontender. No organomegaly or masses felt. Normal bowel sounds heard. Central nervous system: Alert and oriented.  Extremities: Symmetric 5 x 5 power. Skin: No rashes, lesions or ulcers.  Psychiatry: Judgement and insight appear normal. Mood & affect appropriate.     Data Reviewed: I have personally reviewed following labs and imaging studies  CBC: Recent Labs  Lab 01/07/2020 0937 01/19/20 0518 01/20/20 0452 01/21/20 0637  WBC 2.9* 2.4* 4.3 6.8  NEUTROABS 2.2 1.8 3.2 5.9  HGB 9.6* 9.4* 9.7* 10.3*  HCT 29.2* 29.5* 29.2* 31.6*  MCV 86.4 88.9 86.4 86.1  PLT 186 200 239 366   Basic Metabolic Panel: Recent Labs  Lab 01/07/2020 0937 01/19/20 0518 01/20/20 0452 01/21/20 0637  NA 138 142 145 147*  K 4.2 5.1 5.0 4.5  CL 102 106 110 110  CO2 24 23 25 24   GLUCOSE 113* 108* 129* 159*  BUN 37* 41* 53* 57*  CREATININE 1.41* 1.22* 1.43* 1.32*  CALCIUM 8.1* 8.4* 8.3* 8.7*  MG  --  2.3 2.6* 2.6*  PHOS  --  3.8 4.8* 3.9   GFR: Estimated Creatinine Clearance: 21.5 mL/min (A) (by C-G formula based on SCr of 1.32 mg/dL (H)). Liver Function Tests: Recent Labs  Lab 01/21/2020 0937 01/19/20 0518 01/20/20 0452 01/21/20 0637  AST 29 32 30 28  ALT 13 14 15 18   ALKPHOS 65 64 66 87  BILITOT 0.8 0.9 0.8 0.8  PROT 6.4* 6.0* 5.9* 6.5  ALBUMIN 3.6 3.4* 3.3* 3.6   No results for input(s): LIPASE, AMYLASE in the last 168 hours. No results for input(s): AMMONIA in the last 168 hours. Coagulation Profile: No results for input(s): INR, PROTIME in the last 168 hours. Cardiac  Enzymes: No results for input(s): CKTOTAL, CKMB, CKMBINDEX, TROPONINI in the last 168 hours. BNP (last 3 results) No results for input(s): PROBNP in the last 8760 hours. HbA1C: No results for input(s): HGBA1C in the last 72 hours. CBG: No results for input(s): GLUCAP in the last 168 hours. Lipid Profile: No results for input(s): CHOL, HDL, LDLCALC, TRIG, CHOLHDL, LDLDIRECT in the last 72 hours. Thyroid Function Tests: No results for input(s): TSH, T4TOTAL, FREET4, T3FREE, THYROIDAB in the last 72 hours. Anemia Panel: Recent Labs    01/20/20 0452 01/21/20 0637  FERRITIN 279 209   Sepsis Labs: Recent Labs  Lab 01/20/2020 4403  LATICACIDVEN 1.2    Recent Results (from the past 240 hour(s))  SARS Coronavirus 2 by RT PCR (hospital order, performed in [facility] hospital lab) Nasopharyngeal Nasopharyngeal Swab     Status: Abnormal   Collection Time: 01/17/2020  9:56 AM   Specimen: Nasopharyngeal Swab  Result Value Ref Range Status   SARS Coronavirus 2 POSITIVE (A) NEGATIVE Final    Comment: RESULT CALLED TO, READ BACK BY AND VERIFIED WITH:  Verlene Mayer [id number] 01/17/2020 SDR (NOTE) SARS-CoV-2 target nucleic acids are DETECTED  SARS-CoV-2 RNA is generally detectable in upper respiratory specimens  during the acute phase of infection.  Positive results are indicative  of the presence of the identified virus, but do not rule out bacterial infection or co-infection with other pathogens not detected by the test.  Clinical correlation with  patient history and  other diagnostic information is necessary to determine patient infection status.  The expected result is negative.  Fact Sheet for Patients:   StrictlyIdeas.no   Fact Sheet for Healthcare Providers:   BankingDealers.co.za    This test is not yet approved or cleared by the [place] FDA and  has been authorized for detection and/or diagnosis of SARS-CoV-2 by FDA under an  Emergency Use Authorization (EUA).  This EUA will remain in effect (meaning this test  can be used) for the duration of  the COVID-19 declaration under Section 564(b)(1) of the Act, 21 U.S.C. section 360-bbb-3(b)(1), unless the authorization is terminated or revoked sooner.  Performed at [facility], [address] [place], [place] [address]       Radiology Studies: US Venous Img Lower Bilateral (DVT)  Result Date: 01/19/2020 CLINICAL DATA:  Bilateral lower extremity edema. Former smoker. Evaluate for DVT. EXAM: BILATERAL LOWER EXTREMITY VENOUS DOPPLER ULTRASOUND TECHNIQUE: Gray-scale sonography with graded compression, as well as color Doppler and duplex ultrasound were performed to evaluate the lower extremity deep venous systems from the level of the common femoral vein and including the common femoral, femoral, profunda femoral, popliteal and calf veins including the posterior tibial, peroneal and gastrocnemius veins when visible. The superficial great saphenous vein was also interrogated. Spectral Doppler was utilized to evaluate flow at rest and with distal augmentation maneuvers in the common femoral, femoral and popliteal veins. COMPARISON:  None. FINDINGS: RIGHT LOWER EXTREMITY Common Femoral Vein: No evidence of thrombus. Normal compressibility, respiratory phasicity and response to augmentation. Saphenofemoral Junction: No evidence of thrombus. Normal compressibility and flow on color Doppler imaging. Profunda Femoral Vein: No evidence of thrombus. Normal compressibility and flow on color Doppler imaging. Femoral Vein: No evidence of thrombus. Normal compressibility, respiratory phasicity and response to augmentation. Popliteal Vein: No evidence of thrombus. Normal compressibility, respiratory phasicity and response to augmentation. Calf Veins: No evidence of thrombus. Normal compressibility and flow on color Doppler imaging. Superficial Great Saphenous Vein: No evidence of  thrombus. Normal compressibility. Venous Reflux:  None. Other Findings:  None. LEFT LOWER EXTREMITY Common Femoral Vein: No evidence of thrombus. Normal compressibility, respiratory phasicity and response to augmentation. Saphenofemoral Junction: No evidence of thrombus. Normal compressibility and flow on color Doppler imaging. Profunda Femoral Vein: No evidence of thrombus. Normal compressibility and flow on color Doppler imaging. Femoral Vein: No evidence of thrombus. Normal compressibility, respiratory phasicity and response to augmentation. Popliteal Vein: No evidence of thrombus. Normal compressibility, respiratory phasicity and response to augmentation. Calf Veins: No evidence of thrombus. Normal compressibility and flow on color Doppler imaging. Superficial Great Saphenous Vein: No evidence of thrombus. Normal compressibility. Venous Reflux:  None. Other Findings:  None. IMPRESSION: No evidence of DVT within either lower extremity. Electronically Signed   By: Sandi Mariscal M.D.   On: 01/19/2020 15:35    Scheduled Meds: . acetaminophen  650 mg Oral BID  . vitamin C  500 mg Oral Daily  . aspirin EC  81 mg Oral Daily  . baricitinib  2 mg Oral Daily  . diltiazem  60 mg Oral Daily  . docusate  100 mg Oral BID  . enoxaparin (LOVENOX) injection  30 mg Subcutaneous Q24H  . fluticasone furoate-vilanterol  1 puff Inhalation Daily  . gabapentin  300 mg Oral Q12H  . lidocaine   Transdermal Daily  . montelukast  10 mg Oral QHS  . pantoprazole  40 mg Oral Daily  . [START ON 02/13/20] predniSONE  50 mg Oral Daily  . rOPINIRole  0.25 mg Oral QHS  . sertraline  150 mg Oral Daily  . sodium chloride flush  3 mL Intravenous Q12H  . zinc sulfate  220 mg Oral Daily   Continuous Infusions: . sodium chloride Stopped (01/19/20 1439)  . remdesivir 100 mg in NS 100 mL 100 mg (01/21/20 0840)     LOS: 3 days   Time spent: 60 min. This concludes all the communication with other consultants, Celeste, house  supervisor, nurses, [facility], family and patient   Darliss Cheney, MD [facility]  01/21/2020, 2:04 PM   To contact the attending provider between 7A-7P or the covering provider during after hours 7P-7A, please log into the web site [email or web address].

## 2020-01-21 NOTE — Progress Notes (Signed)
Patient care escalated to step-down d/t patient desatting while on 15L HFNC and NRB. Patient tachypneic with RR in the high 20s-low 30s. Rapid Response called for this patient and patient transferred to [place]. Report given to receiving RN. Patient family member Clois Dupes) notified of transfer. He had no questions at the time of notification.

## 2020-01-21 NOTE — Significant Event (Signed)
Rapid Response Event Note   Reason for Call : called for RR on covid pos pt having respiratory difficulty.   Initial Focused Assessment: Patient laying in bed, alert and oriented. Sats between 80's low 90's. Remains full code at this time, although pt has expressed no life sustaining measures.       Interventions: taken to stepdown for further management until code status and care wishes confirmed with poa.   Plan of Care:     Event Summary:   MD Notified:  Call Time: Arrival Time: End Time:  Ellijah Leffel A, RN

## 2020-01-21 NOTE — Progress Notes (Signed)
On assessment patient had 15L HFNC and 15L NRB on. She was sating at 96% with both. Assessed lung sounds patient is diminished in all lobes bilaterally. Changed purewick, cleansed periarea. Removed gauze to right upper shin/ knee area, cleansed with NS and applied foam border. She stated she developed the skin tear when she fell at the rehab facility she is from. Lower legs are brown discolored but cap refill >3 seconds. Scattered bruises and scraps from previous falls. She has healed skin tear to right forearm. Applied foam border dressing. She has no complaints of pain. Assessed skin under sacral foam patch, skin intact, only used for protection. After getting patient settled, I turned her on her left side, removed the NRB and patient maintained saturation at 90-92% with 15L HFNC. Laid NRB at bedside and stated to patient if she felt SOB to apply. She did complain of discomfort under her chin from NRB, applied barrier cream to face. Bed in low position and call bell in reach. CMD called to give report and stated that patient does desat very quickly when she takes off O2.

## 2020-01-21 NOTE — Progress Notes (Signed)
Spoke with Leroy Sea, patient grandson and he requested if we could video chat. Set up in room for patient to speak with grandson. Roughly spoke for about 15 minutes, patient did begin to desat therefore NRB 15L to support her along with 15L HFNC. He asked patient how she felt about her code status and patient stated "do what you need to do for me but if I am gonna suffer or have difficulties, let me go." [name] stated he was going to talk with his mother about patient decision. She is resting in bed with call bell in reach. Bed in low position.

## 2020-01-21 NOTE — Progress Notes (Signed)
Pt has been non compliant during the shift with keeping the oxygen applied properly. She desaturates to the lower 80s when the oxygen is off. She rebounds successfully (90%-94%) throughout the shift when oxygen is reapplied. Patient is educated on the importance of oxygen therapy as it is beneficial to her respiratory status. Patient verbalizes understanding and agreed to keep her oxygen on. Patient is currently resting at this time. Her oxygen is still applied to her face. She denies any discomforts at this time.

## 2020-01-22 ENCOUNTER — Other Ambulatory Visit: Payer: Self-pay

## 2020-01-22 ENCOUNTER — Inpatient Hospital Stay: Payer: Medicare Other

## 2020-01-22 LAB — PHOSPHORUS: Phosphorus: 3.6 mg/dL (ref 2.5–4.6)

## 2020-01-22 LAB — CBC WITH DIFFERENTIAL/PLATELET
Abs Immature Granulocytes: 0.22 10*3/uL — ABNORMAL HIGH (ref 0.00–0.07)
Basophils Absolute: 0 10*3/uL (ref 0.0–0.1)
Basophils Relative: 0 %
Eosinophils Absolute: 0 10*3/uL (ref 0.0–0.5)
Eosinophils Relative: 0 %
HCT: 32.7 % — ABNORMAL LOW (ref 36.0–46.0)
Hemoglobin: 10.8 g/dL — ABNORMAL LOW (ref 12.0–15.0)
Immature Granulocytes: 2 %
Lymphocytes Relative: 5 %
Lymphs Abs: 0.7 10*3/uL (ref 0.7–4.0)
MCH: 28.6 pg (ref 26.0–34.0)
MCHC: 33 g/dL (ref 30.0–36.0)
MCV: 86.5 fL (ref 80.0–100.0)
Monocytes Absolute: 0.4 10*3/uL (ref 0.1–1.0)
Monocytes Relative: 3 %
Neutro Abs: 13 10*3/uL — ABNORMAL HIGH (ref 1.7–7.7)
Neutrophils Relative %: 90 %
Platelets: 213 10*3/uL (ref 150–400)
RBC: 3.78 MIL/uL — ABNORMAL LOW (ref 3.87–5.11)
RDW: 14.7 % (ref 11.5–15.5)
WBC: 14.3 10*3/uL — ABNORMAL HIGH (ref 4.0–10.5)
nRBC: 0 % (ref 0.0–0.2)

## 2020-01-22 LAB — COMPREHENSIVE METABOLIC PANEL
ALT: 18 U/L (ref 0–44)
AST: 24 U/L (ref 15–41)
Albumin: 3.6 g/dL (ref 3.5–5.0)
Alkaline Phosphatase: 98 U/L (ref 38–126)
Anion gap: 12 (ref 5–15)
BUN: 55 mg/dL — ABNORMAL HIGH (ref 8–23)
CO2: 28 mmol/L (ref 22–32)
Calcium: 8.5 mg/dL — ABNORMAL LOW (ref 8.9–10.3)
Chloride: 108 mmol/L (ref 98–111)
Creatinine, Ser: 1.28 mg/dL — ABNORMAL HIGH (ref 0.44–1.00)
GFR calc Af Amer: 42 mL/min — ABNORMAL LOW (ref >60)
GFR calc non Af Amer: 36 mL/min — ABNORMAL LOW (ref >60)
Glucose, Bld: 127 mg/dL — ABNORMAL HIGH (ref 70–99)
Potassium: 3.8 mmol/L (ref 3.5–5.1)
Sodium: 148 mmol/L — ABNORMAL HIGH (ref 135–145)
Total Bilirubin: 1.1 mg/dL (ref 0.3–1.2)
Total Protein: 6.5 g/dL (ref 6.5–8.1)

## 2020-01-22 LAB — FIBRIN DERIVATIVES D-DIMER (ARMC ONLY): Fibrin derivatives D-dimer (ARMC): >7500 ng/mL (FEU) — ABNORMAL HIGH (ref 0.00–499.00)

## 2020-01-22 LAB — FERRITIN: Ferritin: 256 ng/mL (ref 11–307)

## 2020-01-22 LAB — GLUCOSE, CAPILLARY: Glucose-Capillary: 154 mg/dL — ABNORMAL HIGH (ref 70–99)

## 2020-01-22 LAB — C-REACTIVE PROTEIN: CRP: 16.7 mg/dL — ABNORMAL HIGH (ref <1.0)

## 2020-01-22 LAB — MAGNESIUM: Magnesium: 2.5 mg/dL — ABNORMAL HIGH (ref 1.7–2.4)

## 2020-01-22 MED ORDER — ACETAMINOPHEN 325 MG PO TABS: 650.0000 mg | ORAL_TABLET | Freq: Four times a day (QID) | ORAL | Status: DC | PRN

## 2020-01-22 MED ORDER — DIPHENHYDRAMINE HCL 50 MG/ML IJ SOLN: 25.0000 mg | INTRAMUSCULAR | Status: DC | PRN

## 2020-01-22 MED ORDER — MORPHINE SULFATE (PF) 2 MG/ML IV SOLN
2.0000 mg | INTRAVENOUS | Status: DC | PRN
  Administered 2020-01-22 (×2): 2 mg via INTRAVENOUS

## 2020-01-22 MED ORDER — MORPHINE BOLUS VIA INFUSION
5.0000 mg | INTRAVENOUS | Status: DC | PRN
  Administered 2020-01-22: 5 mg via INTRAVENOUS
  Filled 2020-01-22: qty 5

## 2020-01-22 MED ORDER — MORPHINE 100MG IN NS 100ML (1MG/ML) PREMIX INFUSION
0.0000 mg/h | INTRAVENOUS | Status: DC
  Administered 2020-01-22: 0.5 mg/h via INTRAVENOUS
  Filled 2020-01-22: qty 100

## 2020-01-22 MED ORDER — GLYCOPYRROLATE 0.2 MG/ML IJ SOLN: 0.2000 mg | INTRAMUSCULAR | Status: DC | PRN

## 2020-01-22 MED ORDER — DEXTROSE 5 % IV SOLN
INTRAVENOUS | Status: DC
  Administered 2020-01-22: 20 mL/h via INTRAVENOUS

## 2020-01-22 MED ORDER — POLYVINYL ALCOHOL 1.4 % OP SOLN
1.0000 [drp] | Freq: Four times a day (QID) | OPHTHALMIC | Status: DC | PRN
  Filled 2020-01-22: qty 15

## 2020-01-22 MED ORDER — METHYLPREDNISOLONE SODIUM SUCC 125 MG IJ SOLR
60.0000 mg | Freq: Two times a day (BID) | INTRAMUSCULAR | Status: DC
  Administered 2020-01-22: 60 mg via INTRAVENOUS
  Filled 2020-01-22: qty 2

## 2020-01-22 MED ORDER — MIDAZOLAM HCL 2 MG/2ML IJ SOLN: 2.0000 mg | INTRAMUSCULAR | Status: DC | PRN

## 2020-01-22 MED ORDER — ACETAMINOPHEN 650 MG RE SUPP: 650.0000 mg | Freq: Four times a day (QID) | RECTAL | Status: DC | PRN

## 2020-01-22 MED ORDER — GLYCOPYRROLATE 1 MG PO TABS
1.0000 mg | ORAL_TABLET | ORAL | Status: DC | PRN
  Filled 2020-01-22: qty 1

## 2020-02-03 NOTE — Progress Notes (Signed)
Family remains at bedside.  Pt groaning and gasping for air some. 2mg  morphine bolus given from iv bag.  O2 turned down more once comfortable.

## 2020-02-03 NOTE — Death Summary Note (Signed)
Death Summary  Ana Schultz Ana Schultz:[id number] DOB: 11/09/1925 DOA: 01/29/20  PCP: Ana Pitch, MD PCP/Office notified:   Admit date: 01/29/20 Date of Death: 02/03/2020  Final Diagnoses:  Principal Problem:   Pneumonia due to COVID-19 virus Active Problems:   Benign essential HTN   Chronic obstructive pulmonary disease (COPD) (Ana Schultz)   Hemiplegia and hemiparesis following cerebral infarction affecting left non-dominant side (Ana Schultz)   Major depressive disorder, single episode in full remission (Ana Schultz)   Chronic atrial fibrillation (Ana Schultz)   Acute respiratory failure due to COVID-19 Ana Schultz)   Depression   Goals of care, counseling/discussion   Palliative care by specialist    1. Acute on chronic hypoxic respiratory failure secondary to COVID-19 pneumonia 2. Severe sepsis secondary to COVID-19 pneumonia  History of present illness:  Ana Schultz a 86 y.o.femalewith medical history significant forCVA with residual left-sided weakness, history of recurrent falls currently wheelchair dependent, history of chronic respiratory failure on 3 L of oxygen, history of COPD, hypertension and stage III chronic kidney disease who was brought into the ER by EMS after she was found hypoxic,patient was noted to have room air pulse oximetry in the 40s and was placed on a nonrebreather mask, 15Lwith improvement in her pulse oximetry to 98% Patient tested positive for Covid within the last week and is fully vaccinated with mRNA vaccine from January/February 2021. Upon arrival to the ER patient had a fever with a T-max of101Fand was tachypneic with respiratory rate and was tachypneic with respiratory rate of 32. Labs show sodium of 138, potassium 4.7, chloride 96, bicarb 24, BUN 37, creatinine 1.4, calcium 8.1, albumin 3.6, AST 29, ALT 13, BNP 531, troponin 54, lactic acid 1.2, white count 2.9, hemoglobin 9.6, hematocrit 29.3, MCV 86.4, RDW 13.6, platelet count186 Chest x-ray reviewed by  me shows extensive bilateral airspace opacitiesconsistent with Covid pneumonia Twelve-lead EKG reviewed by me shows sinus rhythm with PACs.  ED Course:Patient is a 86 year old female who resides in a skilled nursing facility and was sent to the ER for evaluation of shortness of breath and hypoxia.Patient was noted to have room air pulse oximetry of 40% and was placed on a nonrebreather mask, 15Lwith improvement in her pulse oximetry to 98%. Chest x-ray is consistent with Covid pneumonia Patient received 2 doses of the COVID-19 vaccine in January.She will be admitted to the hospital for further evaluation.  Hospital Course:  Patient admitted under hospitalist service due to acute on chronic hypoxic respiratory failure and severe sepsis secondary to COVID-19 pneumonia.  She was started on remdesivir as well as Solu-Medrol and baricitinib per patient's family's consent and per protocol for treatment of COVID-19 pneumonia.  Gradually, patient's condition worsened, she required more and more oxygen and she was transferred to ICU.  Intensivist were consulted who had a long and candid discussion with family and with mutual decision between intensivist and family, patient was made DNR and subsequently passed away at 1620 1 PM on 02-02-20.  Time: 20 min  Signed:  Darliss Schultz  [facility] February 03, 2020, 2:21 PM

## 2020-02-03 NOTE — Progress Notes (Signed)
Pt transported to morgue 

## 2020-02-03 NOTE — Progress Notes (Signed)
Post mortem care provided to patient with Durene Fruits rn.  Iv's removed. Toe tag applied. Pt released to [facility] home by CDS.

## 2020-02-03 NOTE — Progress Notes (Signed)
GOALS OF CARE DISCUSSION  The Clinical status was relayed to family in detail. Grandson Brad notified  Updated and notified of patients medical condition.  Patient has increased WOB Upon assessment his breath sounds are course crackles    patient with increased WOB and using accessory muscles to breathe Explained to family course of therapy and the modalities     Patient with Progressive multiorgan failure with very low chance of meaningful recovery despite all aggressive and optimal medical therapy. Patient is in the Dying  Process associated with Suffering.  Family understands the situation.  They have consented and agreed to DNR/DNI and would like to proceed with Comfort care measures.  Family are satisfied with Plan of action and management. All questions answered    Corrin Parker, M.D.  [facility]  Medical Director [facility] Director [facility] Cardio-Pulmonary Department

## 2020-02-03 NOTE — Progress Notes (Signed)
Time of death. Family at bedside.

## 2020-02-03 NOTE — Progress Notes (Signed)
PROGRESS NOTE    Ana Schultz  ZOX:[id number] DOB: 21-Apr-1926 DOA: 01/16/2020 PCP: Juluis Pitch, MD   Brief Narrative:  HPI: Ana Schultz is a 86 y.o. female with medical history significant for CVA with residual left-sided weakness, history of recurrent falls currently wheelchair dependent, history of chronic respiratory failure on 3 L of oxygen, history of COPD, hypertension and stage III chronic kidney disease who was brought into the ER by EMS after she was found hypoxic, patient was noted to have room air pulse oximetry in the 40s and was placed on a nonrebreather mask, 15L with improvement in her pulse oximetry to 98% Patient tested positive for Covid within the last week and is fully vaccinated with mRNA vaccine from January/February 2021. Upon arrival to the ER patient had a fever with a T-max of 101F and was tachypneic with respiratory rate and was tachypneic with respiratory rate of 32. Labs show sodium of 138, potassium 4.7, chloride 96, bicarb 24, BUN 37, creatinine 1.4, calcium 8.1, albumin 3.6, AST 29, ALT 13, BNP 531, troponin 54, lactic acid 1.2, white count 2.9, hemoglobin 9.6, hematocrit 29.3, MCV 86.4, RDW 13.6, platelet count 186 Chest x-ray reviewed by me shows extensive bilateral airspace opacities consistent with Covid pneumonia Twelve-lead EKG reviewed by me shows sinus rhythm with PACs.  ED Course: Patient is a 86 year old female who resides in a skilled nursing facility and was sent to the ER for evaluation of shortness of breath and hypoxia.  Patient was noted to have room air pulse oximetry of 40% and was placed on a nonrebreather mask, 15L with improvement in her pulse oximetry to 98%. Chest x-ray is consistent with Covid pneumonia Patient received 2 doses of the COVID-19 vaccine in January.  She will be admitted to the hospital for further evaluation.  Assessment & Plan:   Principal Problem:   Pneumonia due to COVID-19 virus Active Problems:    Benign essential HTN   Chronic obstructive pulmonary disease (COPD) (HCC)   Hemiplegia and hemiparesis following cerebral infarction affecting left non-dominant side (HCC)   Major depressive disorder, single episode in full remission ([place])   Chronic atrial fibrillation (H[facility])   Acute respiratory failure due to COVID-19 [facility])   Depression   Goals of care, counseling/discussion   Palliative care by specialist   Acute on chronic hypoxic respiratory failure and severe sepsis due to COVID-19 pneumonia virus: Patient meets sepsis criteria based on tachypnea, fever and hypoxia. Patient resides in a skilled nursing facility and received the COVID-19 vaccine in Jan/Feb She tested positive for the COVID-19 virus about a week ago and then here in the hospital. Patient had room air pulse oximetry of 40% at the scene and this improved following oxygen supplementation with a nonrebreather mask at 15 L.  She uses 2 L of nasal oxygen at baseline due to her COPD.  She is now on 90% high flow nasal cannula.  Due to high amount of oxygen requirement, PCCM was consulted this morning and intensivist had a long discussion with family members and eventually patient was made comfort care. Continue following: Remdesivir per pharmacy protocol, IV Solu-Medrol Baricitinib-patient's daughter consented to that. Supportive treatment, antitussive. Zinc and vitamin C. Patient was encouraged to prone, out of bed to chair, to use incentive spirometry and flutter valve.  History of paroxysmal A. Fib: Continue Cardizem for rate control Patient not on long-term anticoagulation therapy due to history of recurrent falls  History of CVA with left-sided hemiparesis Patient requires assistance with  activities of daily living Continue aspirin  Depression Continue sertraline  CKD stage IIIb: At baseline.  Monitor.  Medical decision capacity: Per my discussion with PCCM, family has decided to pursue comfort care.  DVT  prophylaxis: enoxaparin (LOVENOX) injection 30 mg Start: 01/17/2020 2200   Code Status: DNR  Family Communication: None present at bedside.  PCCM had a long conversation with the family already.  Remains inpatient appropriate because:Inpatient level of care appropriate due to severity of illness   Dispo: The patient is from: SNF              Anticipated d/c is to: SNF              Anticipated d/c date is: > 3 days              Patient currently is not medically stable to d/c.        Estimated body mass index is 21.01 kg/m as calculated from the following:   Height as of this encounter: 5\' 2"  (1.575 m).   Weight as of this encounter: 52.1 kg.      Nutritional status:               Consultants:   Palliative care  Procedures:   None  Antimicrobials:  Anti-infectives (From admission, onward)   Start     Dose/Rate Route Frequency Ordered Stop   01/19/20 1000  remdesivir 100 mg in sodium chloride 0.9 % 100 mL IVPB       "Followed by" Linked Group Details   100 mg 200 mL/hr over 30 Minutes Intravenous Daily 01/27/2020 0958 01/23/20 0959   01/19/20 1000  remdesivir 100 mg in sodium chloride 0.9 % 100 mL IVPB  Status:  Discontinued       "Followed by" Linked Group Details   100 mg 200 mL/hr over 30 Minutes Intravenous Daily 01/07/2020 1144 01/28/2020 1148   01/12/2020 1145  remdesivir 200 mg in sodium chloride 0.9% 250 mL IVPB  Status:  Discontinued       "Followed by" Linked Group Details   200 mg 580 mL/hr over 30 Minutes Intravenous Once 01/21/2020 1144 01/14/2020 1148   01/31/2020 1030  remdesivir 200 mg in sodium chloride 0.9% 250 mL IVPB       "Followed by" Linked Group Details   200 mg 580 mL/hr over 30 Minutes Intravenous Once 02/02/2020 0958 01/07/2020 1248         Subjective:  Patient seen and examined this morning.  She was on high flow but she was still alert and oriented.  I was informed by RN that patient had once again expressed her wishes multiple times to  be DNR.  I personally asked her 1 more time, she was very clear, alert and oriented and had the capacity to make her decisions and she wanted to be DNR but eventually PCCM had a discussion with the family and they also agreed with DNR as well.  Objective: Vitals:   January 25, 2020 1000 25-Jan-2020 1100 Jan 25, 2020 1200 01/25/2020 1300  BP: (!) 111/55 132/70 (!) 144/77   Pulse: (!) 128 (!) 28 (!) 109   Resp: (!) 24 (!) 26 (!) 28 (!) 36  Temp:  98.1 F (36.7 C)    TempSrc:  Axillary    SpO2: (!) 88% (!) 86% 92% (!) 77%  Weight:      Height:        Intake/Output Summary (Last 24 hours) at 2020/01/25 1441 Last data filed at 01/21/2020 2200  Gross per 24 hour  Intake 100 ml  Output 600 ml  Net -500 ml   Filed Weights   02/01/2020 0935 01/19/20 0410  Weight: 54.4 kg 52.1 kg    Examination:  General exam: Appears calm and comfortable  Respiratory system: Diminished breath sounds bilaterally. Respiratory effort normal. Cardiovascular system: S1 & S2 heard, RRR. No JVD, murmurs, rubs, gallops or clicks. No pedal edema. Gastrointestinal system: Abdomen is nondistended, soft and nontender. No organomegaly or masses felt. Normal bowel sounds heard. Central nervous system: Alert and oriented. No focal neurological deficits. Extremities: Symmetric 5 x 5 power. Skin: No rashes, lesions or ulcers.  Psychiatry: Judgement and insight appear normal. Mood & affect appropriate.   Data Reviewed: I have personally reviewed following labs and imaging studies  CBC: Recent Labs  Lab 01/07/2020 0937 01/19/20 0518 01/20/20 0452 01/21/20 0637 Feb 10, 2020 0442  WBC 2.9* 2.4* 4.3 6.8 14.3*  NEUTROABS 2.2 1.8 3.2 5.9 13.0*  HGB 9.6* 9.4* 9.7* 10.3* 10.8*  HCT 29.2* 29.5* 29.2* 31.6* 32.7*  MCV 86.4 88.9 86.4 86.1 86.5  PLT 186 200 239 212 431   Basic Metabolic Panel: Recent Labs  Lab 01/25/2020 0937 01/19/20 0518 01/20/20 0452 01/21/20 0637 02-10-2020 0442  NA 138 142 145 147* 148*  K 4.2 5.1 5.0 4.5 3.8  CL  102 106 110 110 108  CO2 24 23 25 24 28   GLUCOSE 113* 108* 129* 159* 127*  BUN 37* 41* 53* 57* 55*  CREATININE 1.41* 1.22* 1.43* 1.32* 1.28*  CALCIUM 8.1* 8.4* 8.3* 8.7* 8.5*  MG  --  2.3 2.6* 2.6* 2.5*  PHOS  --  3.8 4.8* 3.9 3.6   GFR: Estimated Creatinine Clearance: 22.2 mL/min (A) (by C-G formula based on SCr of 1.28 mg/dL (H)). Liver Function Tests: Recent Labs  Lab 01/26/2020 0937 01/19/20 0518 01/20/20 0452 01/21/20 0637 2020/02/10 0442  AST 29 32 30 28 24   ALT 13 14 15 18 18   ALKPHOS 65 64 66 87 98  BILITOT 0.8 0.9 0.8 0.8 1.1  PROT 6.4* 6.0* 5.9* 6.5 6.5  ALBUMIN 3.6 3.4* 3.3* 3.6 3.6   No results for input(s): LIPASE, AMYLASE in the last 168 hours. No results for input(s): AMMONIA in the last 168 hours. Coagulation Profile: No results for input(s): INR, PROTIME in the last 168 hours. Cardiac Enzymes: No results for input(s): CKTOTAL, CKMB, CKMBINDEX, TROPONINI in the last 168 hours. BNP (last 3 results) No results for input(s): PROBNP in the last 8760 hours. HbA1C: No results for input(s): HGBA1C in the last 72 hours. CBG: Recent Labs  Lab 01/21/20 1839 2020/02/10 0740  GLUCAP 132* 154*   Lipid Profile: No results for input(s): CHOL, HDL, LDLCALC, TRIG, CHOLHDL, LDLDIRECT in the last 72 hours. Thyroid Function Tests: No results for input(s): TSH, T4TOTAL, FREET4, T3FREE, THYROIDAB in the last 72 hours. Anemia Panel: Recent Labs    01/21/20 0637 02/10/2020 0442  FERRITIN 209 256   Sepsis Labs: Recent Labs  Lab 01/16/2020 5400  LATICACIDVEN 1.2    Recent Results (from the past 240 hour(s))  SARS Coronavirus 2 by RT PCR (hospital order, performed in [facility] hospital lab) Nasopharyngeal Nasopharyngeal Swab     Status: Abnormal   Collection Time: 01/08/2020  9:56 AM   Specimen: Nasopharyngeal Swab  Result Value Ref Range Status   SARS Coronavirus 2 POSITIVE (A) NEGATIVE Final    Comment: RESULT CALLED TO, READ BACK BY AND VERIFIED WITH:  Verlene Mayer  [id number] 01/24/2020 SDR (NOTE) SARS-CoV-2  target nucleic acids are DETECTED  SARS-CoV-2 RNA is generally detectable in upper respiratory specimens  during the acute phase of infection.  Positive results are indicative  of the presence of the identified virus, but do not rule out bacterial infection or co-infection with other pathogens not detected by the test.  Clinical correlation with patient history and  other diagnostic information is necessary to determine patient infection status.  The expected result is negative.  Fact Sheet for Patients:   StrictlyIdeas.no   Fact Sheet for Healthcare Providers:   BankingDealers.co.za    This test is not yet approved or cleared by the [place] FDA and  has been authorized for detection and/or diagnosis of SARS-CoV-2 by FDA under an Emergency Use Authorization (EUA).  This EUA will remain in effect (meaning this test  can be used) for the duration of  the COVID-19 declaration under Section 564(b)(1) of the Act, 21 U.S.C. section 360-bbb-3(b)(1), unless the authorization is terminated or revoked sooner.  Performed at [facility], [address] [place], [place] [address]   MRSA PCR Screening     Status: Abnormal   Collection Time: 01/21/20  7:33 PM   Specimen: Nasopharyngeal  Result Value Ref Range Status   MRSA by PCR POSITIVE (A) NEGATIVE Final    Comment:        The GeneXpert MRSA Assay (FDA approved for NASAL specimens only), is one component of a comprehensive MRSA colonization surveillance program. It is not intended to diagnose MRSA infection nor to guide or monitor treatment for MRSA infections. RESULT CALLED TO, READ BACK BY AND VERIFIED WITH: Debbora Lacrosse AT 2115 ON 01/21/20 BY SS Performed at [facility], [address] [place], [place] [address]       Radiology Studies: [facility] Chest [facility] 1 View  Result Date: 2020/01/24 CLINICAL DATA:  COVID-19  pneumonia. EXAM: PORTABLE CHEST 1 VIEW COMPARISON:  01/21/2020 FINDINGS: Sternotomy wires unchanged. Lungs are adequately inflated demonstrate significant bilateral multifocal airspace process with overall worsening in the right lung and slight interval improved aeration of the left lung. No effusion. Cardiomediastinal silhouette and remainder of the exam is unchanged. IMPRESSION: Significant bilateral multifocal airspace process with interval worsening on the right and slight interval improved aeration of the left lung likely multifocal pneumonia. Electronically Signed   By: Marin Olp M.D.   On: 01-24-2020 09:14    Scheduled Meds: . acetaminophen  650 mg Oral BID  . vitamin C  500 mg Oral Daily  . aspirin EC  81 mg Oral Daily  . baricitinib  2 mg Oral Daily  . Chlorhexidine Gluconate Cloth  6 each Topical Daily  . diltiazem  60 mg Oral Daily  . docusate  100 mg Oral BID  . enoxaparin (LOVENOX) injection  30 mg Subcutaneous Q24H  . fluticasone furoate-vilanterol  1 puff Inhalation Daily  . gabapentin  300 mg Oral Q12H  . lidocaine   Transdermal Daily  . methylPREDNISolone (SOLU-MEDROL) injection  60 mg Intravenous Q12H  . montelukast  10 mg Oral QHS  . mupirocin ointment  1 application Nasal BID  . pantoprazole  40 mg Oral Daily  . rOPINIRole  0.25 mg Oral QHS  . sertraline  150 mg Oral Daily  . sodium chloride flush  3 mL Intravenous Q12H  . zinc sulfate  220 mg Oral Daily   Continuous Infusions: . sodium chloride Stopped (01/19/20 1439)  . dextrose 20 mL/hr (January 24, 2020 1117)  . morphine 5 mg/hr (01-24-2020 1308)  .  remdesivir 100 mg in NS 100 mL Stopped (01/21/20 1022)     LOS: 4 days   Time spent: 32 minutes   Darliss Cheney, MD [facility]  01/29/2020, 2:41 PM   To contact the attending provider between 7A-7P or the covering provider during after hours 7P-7A, please log into the web site [email or web address].

## 2020-02-03 NOTE — TOC Progression Note (Signed)
Transition of Care [facility]) - Progression Note    Patient Details  Name: Ana Schultz MRN: [id number] Date of Birth: January 30, 1926  Transition of Care [facility]) CM/SW Contact  Shelbie Hutching, RN Phone Number: 2020-02-10, 3:21 PM  Clinical Narrative:     RNCM spoke with patient's grandson, Leroy Sea, he and his mother are here at the hospital to visit with the patient not that she is comfort care.  Informed grandson to reach out if there is anything they need.    Expected Discharge Plan: [place] Barriers to Discharge: Continued Medical Work up  Expected Discharge Plan and Services Expected Discharge Plan: [place]   Discharge Planning Services: CM Consult Post Acute Care Choice: [place] Living arrangements for the past 2 months: Assisted Living Facility                           HH Arranged: NA           Social Determinants of Health (SDOH) Interventions    Readmission Risk Interventions Readmission Risk Prevention Plan 01/19/2020  Transportation Screening Complete  Medication Review [employer]) Referral to Pharmacy  PCP or Specialist appointment within 3-5 days of discharge Complete  HRI or [place] (No Data)  SW Recovery Care/Counseling Consult Complete  Palliative Care Screening Not Complete  Comments pending order for palliative care consult  [place] Not Complete  SNF Comments need to see patient progression for appropriate dispostion  Some recent data might be hidden

## 2020-02-03 NOTE — Progress Notes (Signed)
Family ready for oxygen to be turned off.  Oxygen removed at 1605  Pt cont to grunt with each gasping breath. Periods of apnea noted.   2mg  morphine bolus given.  Family tearful and at bedside.  rn stepped out to give them privacy.

## 2020-02-03 NOTE — Progress Notes (Signed)
Monitor off at family request.  Pt was very uncomfortable with BP cuff and wires.  Pulling at them.

## 2020-02-03 NOTE — Progress Notes (Signed)
Pt very uncomfortable.  C/o pain all over and pressure in the chest.  Breathing is labored.  Increased morphine drip rate and gave 5mg  bolus.  Once patient was comfortable, removed NRB mask and began weaning o2 down.  Family requested that we go down gradually rather than just turn it off

## 2020-02-03 NOTE — TOC Progression Note (Signed)
Transition of Care [facility]) - Progression Note    Patient Details  Name: Ana Schultz MRN: [id number] Date of Birth: [birth year]  Transition of Care [facility]) CM/SW Contact  Shelbie Hutching, RN Phone Number: January 30, 2020, 9:24 AM  Clinical Narrative:    RNCM spoke with patient's daughter, [name] via phone.  [name] reports that she and her son share HCPOA and they are on the same page for the patient's care.  Daughter reports that they do not want the patient to suffer but they do want her to be treated.  They definitely would not want CPR or shocks if the patient goes into cardiac arrest.  As far as intubation the daughter says they would have to talk about that.  Daughter requests a new attending to be assigned to her mother, current attending is aware of this and the ICU will manage patient's care.     Expected Discharge Plan: [place] Barriers to Discharge: Continued Medical Work up  Expected Discharge Plan and Services Expected Discharge Plan: [place]   Discharge Planning Services: CM Consult Post Acute Care Choice: [place] Living arrangements for the past 2 months: Assisted Living Facility                           HH Arranged: NA           Social Determinants of Health (SDOH) Interventions    Readmission Risk Interventions Readmission Risk Prevention Plan 01/19/2020  Transportation Screening Complete  Medication Review [employer]) Referral to Pharmacy  PCP or Specialist appointment within 3-5 days of discharge Complete  HRI or [place] (No Data)  SW Recovery Care/Counseling Consult Complete  Palliative Care Screening Not Complete  Comments pending order for palliative care consult  [place] Not Complete  SNF Comments need to see patient progression for appropriate dispostion  Some recent data might be hidden

## 2020-02-03 NOTE — Progress Notes (Signed)
Had episode of chest pain heart rate elevated to 130's nonsustained, medicated patient with tylenol and oxycodone and obtained an EKG. Notified NP [place] of incident.

## 2020-02-03 DEATH — deceased

## 2020-03-06 ENCOUNTER — Ambulatory Visit: Payer: Medicare Other | Admitting: Internal Medicine
